# Patient Record
Sex: Female | Born: 1985 | Race: White | Hispanic: No | Smoking: Former smoker
Health system: Southern US, Community
[De-identification: ages and names within clinical notes are randomized; demographics above are authoritative.]

## PROBLEM LIST (undated history)

## (undated) DIAGNOSIS — F419 Anxiety disorder, unspecified: Secondary | ICD-10-CM

## (undated) DIAGNOSIS — A419 Sepsis, unspecified organism: Secondary | ICD-10-CM

## (undated) DIAGNOSIS — N289 Disorder of kidney and ureter, unspecified: Secondary | ICD-10-CM

## (undated) DIAGNOSIS — J189 Pneumonia, unspecified organism: Secondary | ICD-10-CM

## (undated) DIAGNOSIS — F4310 Post-traumatic stress disorder, unspecified: Secondary | ICD-10-CM

## (undated) DIAGNOSIS — Z87442 Personal history of urinary calculi: Secondary | ICD-10-CM

## (undated) DIAGNOSIS — K551 Chronic vascular disorders of intestine: Secondary | ICD-10-CM

## (undated) DIAGNOSIS — M358 Other specified systemic involvement of connective tissue: Secondary | ICD-10-CM

## (undated) DIAGNOSIS — I7300 Raynaud's syndrome without gangrene: Secondary | ICD-10-CM

## (undated) DIAGNOSIS — F410 Panic disorder [episodic paroxysmal anxiety] without agoraphobia: Secondary | ICD-10-CM

## (undated) DIAGNOSIS — M1990 Unspecified osteoarthritis, unspecified site: Secondary | ICD-10-CM

## (undated) DIAGNOSIS — N200 Calculus of kidney: Secondary | ICD-10-CM

## (undated) DIAGNOSIS — O1490 Unspecified pre-eclampsia, unspecified trimester: Secondary | ICD-10-CM

## (undated) DIAGNOSIS — D649 Anemia, unspecified: Secondary | ICD-10-CM

## (undated) DIAGNOSIS — Z9889 Other specified postprocedural states: Secondary | ICD-10-CM

## (undated) DIAGNOSIS — G43909 Migraine, unspecified, not intractable, without status migrainosus: Secondary | ICD-10-CM

## (undated) DIAGNOSIS — T8859XA Other complications of anesthesia, initial encounter: Secondary | ICD-10-CM

## (undated) DIAGNOSIS — M419 Scoliosis, unspecified: Secondary | ICD-10-CM

## (undated) DIAGNOSIS — L940 Localized scleroderma [morphea]: Secondary | ICD-10-CM

## (undated) DIAGNOSIS — T7840XA Allergy, unspecified, initial encounter: Secondary | ICD-10-CM

## (undated) DIAGNOSIS — R569 Unspecified convulsions: Secondary | ICD-10-CM

## (undated) DIAGNOSIS — F909 Attention-deficit hyperactivity disorder, unspecified type: Secondary | ICD-10-CM

## (undated) DIAGNOSIS — R112 Nausea with vomiting, unspecified: Secondary | ICD-10-CM

## (undated) DIAGNOSIS — F32A Depression, unspecified: Secondary | ICD-10-CM

## (undated) DIAGNOSIS — K743 Primary biliary cirrhosis: Secondary | ICD-10-CM

## (undated) HISTORY — DX: Sepsis, unspecified organism: A41.9

## (undated) HISTORY — DX: Calculus of kidney: N20.0

## (undated) HISTORY — DX: Anxiety disorder, unspecified: F41.9

## (undated) HISTORY — PX: OTHER SURGICAL HISTORY: SHX169

## (undated) HISTORY — DX: Allergy, unspecified, initial encounter: T78.40XA

## (undated) HISTORY — PX: WISDOM TOOTH EXTRACTION: SHX21

## (undated) HISTORY — DX: Depression, unspecified: F32.A

## (undated) HISTORY — PX: NO PAST SURGERIES: SHX2092

---
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
## 2008-03-07 ENCOUNTER — Emergency Department: Payer: Self-pay | Admitting: Emergency Medicine

## 2008-04-13 ENCOUNTER — Emergency Department: Payer: Self-pay | Admitting: Internal Medicine

## 2008-07-30 ENCOUNTER — Emergency Department: Payer: Self-pay | Admitting: Emergency Medicine

## 2009-04-28 ENCOUNTER — Emergency Department: Payer: Self-pay | Admitting: Emergency Medicine

## 2009-10-29 ENCOUNTER — Emergency Department: Payer: Self-pay | Admitting: Emergency Medicine

## 2011-08-31 ENCOUNTER — Emergency Department: Payer: Self-pay | Admitting: Emergency Medicine

## 2011-08-31 LAB — COMPREHENSIVE METABOLIC PANEL
Anion Gap: 9 (ref 7–16)
BUN: 8 mg/dL (ref 7–18)
Bilirubin,Total: 0.7 mg/dL (ref 0.2–1.0)
Calcium, Total: 9 mg/dL (ref 8.5–10.1)
Chloride: 102 mmol/L (ref 98–107)
Creatinine: 0.51 mg/dL — ABNORMAL LOW (ref 0.60–1.30)
EGFR (African American): >60
Potassium: 3.9 mmol/L (ref 3.5–5.1)
SGOT(AST): 21 U/L (ref 15–37)
Total Protein: 6.9 g/dL (ref 6.4–8.2)

## 2011-08-31 LAB — URINALYSIS, COMPLETE
Bilirubin,UR: NEGATIVE
Blood: NEGATIVE
Glucose,UR: NEGATIVE mg/dL (ref 0–75)
Leukocyte Esterase: NEGATIVE
Nitrite: NEGATIVE
Ph: 7 (ref 4.5–8.0)
RBC,UR: 1 /HPF (ref 0–5)
Squamous Epithelial: <1
WBC UR: 3 /HPF (ref 0–5)

## 2011-08-31 LAB — CBC
MCH: 33.9 pg (ref 26.0–34.0)
MCHC: 34.2 g/dL (ref 32.0–36.0)
MCV: 99 fL (ref 80–100)
Platelet: 174 10*3/uL (ref 150–440)
RBC: 3.8 10*6/uL (ref 3.80–5.20)
RDW: 12.9 % (ref 11.5–14.5)

## 2012-04-09 ENCOUNTER — Emergency Department: Payer: Self-pay | Admitting: Emergency Medicine

## 2012-04-09 LAB — COMPREHENSIVE METABOLIC PANEL
Albumin: 4.1 g/dL (ref 3.4–5.0)
Anion Gap: 9 (ref 7–16)
BUN: 11 mg/dL (ref 7–18)
Bilirubin,Total: 0.7 mg/dL (ref 0.2–1.0)
Calcium, Total: 9.1 mg/dL (ref 8.5–10.1)
EGFR (African American): >60
Glucose: 69 mg/dL (ref 65–99)
Osmolality: 272 (ref 275–301)
Potassium: 3.7 mmol/L (ref 3.5–5.1)
Sodium: 137 mmol/L (ref 136–145)
Total Protein: 7.2 g/dL (ref 6.4–8.2)

## 2012-04-09 LAB — CBC
HCT: 40.1 % (ref 35.0–47.0)
MCH: 34.1 pg — ABNORMAL HIGH (ref 26.0–34.0)
MCV: 100 fL (ref 80–100)
RBC: 4.01 10*6/uL (ref 3.80–5.20)
RDW: 12.1 % (ref 11.5–14.5)
WBC: 12.2 10*3/uL — ABNORMAL HIGH (ref 3.6–11.0)

## 2012-04-09 LAB — URINALYSIS, COMPLETE
Glucose,UR: NEGATIVE mg/dL (ref 0–75)
Leukocyte Esterase: NEGATIVE
Nitrite: NEGATIVE
Ph: 6 (ref 4.5–8.0)
Protein: NEGATIVE
RBC,UR: 5 /HPF (ref 0–5)
WBC UR: 1 /HPF (ref 0–5)

## 2012-04-09 LAB — HCG, QUANTITATIVE, PREGNANCY: Beta Hcg, Quant.: 48792 m[IU]/mL — ABNORMAL HIGH

## 2012-06-01 ENCOUNTER — Emergency Department: Payer: Self-pay | Admitting: Emergency Medicine

## 2012-06-12 ENCOUNTER — Encounter: Payer: Self-pay | Admitting: Maternal & Fetal Medicine

## 2012-10-27 ENCOUNTER — Observation Stay: Payer: Self-pay | Admitting: Obstetrics and Gynecology

## 2012-10-27 LAB — URINALYSIS, COMPLETE
Bilirubin,UR: NEGATIVE
Blood: NEGATIVE
Glucose,UR: NEGATIVE mg/dL (ref 0–75)
Leukocyte Esterase: NEGATIVE
Nitrite: NEGATIVE
Ph: 7 (ref 4.5–8.0)
Protein: NEGATIVE
Specific Gravity: 1.01 (ref 1.003–1.030)
WBC UR: 3 /HPF (ref 0–5)

## 2012-10-29 LAB — URINE CULTURE

## 2012-11-29 ENCOUNTER — Inpatient Hospital Stay: Payer: Self-pay | Admitting: Obstetrics and Gynecology

## 2012-11-29 LAB — CBC WITH DIFFERENTIAL/PLATELET
Basophil #: 0 10*3/uL (ref 0.0–0.1)
Basophil %: 0.2 %
Eosinophil #: 0.1 10*3/uL (ref 0.0–0.7)
HCT: 27.8 % — ABNORMAL LOW (ref 35.0–47.0)
Lymphocyte #: 2.1 10*3/uL (ref 1.0–3.6)
MCH: 31.9 pg (ref 26.0–34.0)
Monocyte #: 0.9 x10 3/mm (ref 0.2–0.9)
Monocyte %: 7.8 %
Neutrophil %: 73.9 %
Platelet: 232 10*3/uL (ref 150–440)

## 2012-12-01 LAB — HEMATOCRIT: HCT: 26.6 % — ABNORMAL LOW (ref 35.0–47.0)

## 2013-04-16 ENCOUNTER — Emergency Department: Payer: Self-pay | Admitting: Emergency Medicine

## 2013-04-16 LAB — COMPREHENSIVE METABOLIC PANEL
Albumin: 3.8 g/dL (ref 3.4–5.0)
Anion Gap: 6 — ABNORMAL LOW (ref 7–16)
Chloride: 108 mmol/L — ABNORMAL HIGH (ref 98–107)
Co2: 25 mmol/L (ref 21–32)
Creatinine: 0.7 mg/dL (ref 0.60–1.30)
EGFR (Non-African Amer.): >60
Glucose: 86 mg/dL (ref 65–99)
Osmolality: 277 (ref 275–301)
Potassium: 3.7 mmol/L (ref 3.5–5.1)
SGOT(AST): 16 U/L (ref 15–37)
SGPT (ALT): 21 U/L (ref 12–78)
Sodium: 139 mmol/L (ref 136–145)

## 2013-04-16 LAB — CBC
MCH: 33.5 pg (ref 26.0–34.0)
MCV: 95 fL (ref 80–100)
Platelet: 237 10*3/uL (ref 150–440)
RBC: 3.98 10*6/uL (ref 3.80–5.20)
RDW: 13 % (ref 11.5–14.5)
WBC: 5 10*3/uL (ref 3.6–11.0)

## 2013-04-16 LAB — URINALYSIS, COMPLETE
Bacteria: NONE SEEN
Glucose,UR: NEGATIVE mg/dL (ref 0–75)
Ketone: NEGATIVE
Nitrite: NEGATIVE
Ph: 6 (ref 4.5–8.0)
Protein: NEGATIVE
RBC,UR: <1 /HPF (ref 0–5)
Specific Gravity: 1.014 (ref 1.003–1.030)
Squamous Epithelial: <1

## 2013-12-01 ENCOUNTER — Emergency Department: Payer: Self-pay | Admitting: Emergency Medicine

## 2014-08-22 ENCOUNTER — Emergency Department: Payer: Self-pay | Admitting: Emergency Medicine

## 2014-08-25 ENCOUNTER — Emergency Department: Payer: Self-pay | Admitting: Emergency Medicine

## 2014-10-22 NOTE — H&P (Signed)
L&D Evaluation:  History Expanded:  HPI 29 yo G1 at 36 weeks of pregnancy who had a yeast infection diagnosed last week and she did not get the rx filled, she is having contractions now. VNI, tdap   Gravida 1   Term 0   PreTerm 0   Abortion 0   Living 0   Blood Type (Maternal) O positive   Group B Strep Results Maternal (Result >5wks must be treated as unknown) unknown/result > 5 weeks ago   Maternal HIV Negative   Maternal Syphilis Ab Nonreactive   Maternal Varicella Non-Immune   Rubella Results (Maternal) immune   Maternal [facility]-Dap Unknown   [facility]   Presents with contractions   Patient's Medical History anxiety   Patient's Surgical History none   Medications Pre Natal Vitamins   Allergies NKDA   Social History none   Family History Non-Contributory   Current Prenatal Course Notable For anxiety and first preg   ROS:  ROS All systems were reviewed.  HEENT, CNS, GI, GU, Respiratory, CV, Renal and Musculoskeletal systems were found to be normal.   Exam:  Vital Signs stable   Urine Protein 3plus prot   General no apparent distress   Mental Status clear   Chest clear   Heart normal sinus rhythm   Abdomen gravid, tender with contractions   Estimated Fetal Weight Average for gestational age   Fetal Position v   Back no CVAT   Edema no edema   Pelvic no external lesions   Mebranes Intact   FHT normal rate with no decels, cat 1 strictly reactive   Fetal Heart Rate 140   Ucx irregular   Skin dry   Impression:  Impression early labor, UTI   Plan:  Plan UA, monitor contractions and for cervical change   Comments terb for ctx to stop  cvx is closed and long head at zero station   Follow Up Appointment monday   Electronic Signatures: Adria DevonKlett, Aerionna Moravek (MD)  (Signed 16-May-14 20:42)  Authored: L&D Evaluation   Last Updated: 16-May-14 20:42 by Adria DevonKlett, Mohab Ashby (MD)

## 2015-04-20 ENCOUNTER — Emergency Department
Admission: EM | Admit: 2015-04-20 | Discharge: 2015-04-20 | Disposition: A | Discharge status: Home / Self Care | Payer: Self-pay | Attending: Emergency Medicine | Admitting: Emergency Medicine

## 2015-04-20 ENCOUNTER — Encounter: Payer: Self-pay | Admitting: Emergency Medicine

## 2015-04-20 DIAGNOSIS — Z720 Tobacco use: Secondary | ICD-10-CM | POA: Insufficient documentation

## 2015-04-20 DIAGNOSIS — R42 Dizziness and giddiness: Secondary | ICD-10-CM | POA: Insufficient documentation

## 2015-04-20 DIAGNOSIS — R55 Syncope and collapse: Secondary | ICD-10-CM | POA: Insufficient documentation

## 2015-04-20 DIAGNOSIS — Z3202 Encounter for pregnancy test, result negative: Secondary | ICD-10-CM | POA: Insufficient documentation

## 2015-04-20 HISTORY — DX: Migraine, unspecified, not intractable, without status migrainosus: G43.909

## 2015-04-20 LAB — TROPONIN I

## 2015-04-20 LAB — BASIC METABOLIC PANEL
Anion gap: 7 (ref 5–15)
BUN: 12 mg/dL (ref 6–20)
CHLORIDE: 107 mmol/L (ref 101–111)
CO2: 25 mmol/L (ref 22–32)
CREATININE: 0.61 mg/dL (ref 0.44–1.00)
Calcium: 9.9 mg/dL (ref 8.9–10.3)
GFR calc Af Amer: >60 mL/min (ref >60)
GFR calc non Af Amer: >60 mL/min (ref >60)
Glucose, Bld: 112 mg/dL — ABNORMAL HIGH (ref 65–99)
POTASSIUM: 3.9 mmol/L (ref 3.5–5.1)
SODIUM: 139 mmol/L (ref 135–145)

## 2015-04-20 LAB — URINALYSIS COMPLETE WITH MICROSCOPIC (ARMC ONLY)
BACTERIA UA: NONE SEEN
Bilirubin Urine: NEGATIVE
Glucose, UA: NEGATIVE mg/dL
Ketones, ur: NEGATIVE mg/dL
LEUKOCYTES UA: NEGATIVE
Nitrite: NEGATIVE
PH: 6 (ref 5.0–8.0)
Protein, ur: NEGATIVE mg/dL
Specific Gravity, Urine: 1.016 (ref 1.005–1.030)

## 2015-04-20 LAB — CBC
HEMATOCRIT: 40.1 % (ref 35.0–47.0)
Hemoglobin: 13.5 g/dL (ref 12.0–16.0)
MCH: 32.4 pg (ref 26.0–34.0)
MCHC: 33.6 g/dL (ref 32.0–36.0)
MCV: 96.4 fL (ref 80.0–100.0)
Platelets: 223 10*3/uL (ref 150–440)
RBC: 4.16 MIL/uL (ref 3.80–5.20)
RDW: 12.6 % (ref 11.5–14.5)
WBC: 8.2 10*3/uL (ref 3.6–11.0)

## 2015-04-20 LAB — POCT PREGNANCY, URINE: PREG TEST UR: NEGATIVE

## 2015-04-20 NOTE — Discharge Instructions (Signed)
As we discussed please call the number provided for cardiology to arrange a Holter monitor test. Return to the emergency department for any further chest pain, or syncopal episodes. Please stay very well-hydrated, drinking plenty of water throughout the day. Please limit caffeine use after 2 PM. As we have discussed please use over-the-counter Maalox one hour prior to going to bed, for symptomatic reflux relief.   Dizziness Dizziness is a common problem. It is a feeling of unsteadiness or light-headedness. You may feel like you are about to faint. Dizziness can lead to injury if you stumble or fall. Anyone can become dizzy, but dizziness is more common in older adults. This condition can be caused by a number of things, including medicines, dehydration, or illness. HOME CARE INSTRUCTIONS Taking these steps may help with your condition: Eating and Drinking  Drink enough fluid to keep your urine clear or pale yellow. This helps to keep you from becoming dehydrated. Try to drink more clear fluids, such as water.  Do not drink alcohol.  Limit your caffeine intake if directed by your health care provider.  Limit your salt intake if directed by your health care provider. Activity  Avoid making quick movements.  Rise slowly from chairs and steady yourself until you feel okay.  In the morning, first sit up on the side of the bed. When you feel okay, stand slowly while you hold onto something until you know that your balance is fine.  Move your legs often if you need to stand in one place for a long time. Tighten and relax your muscles in your legs while you are standing.  Do not drive or operate heavy machinery if you feel dizzy.  Avoid bending down if you feel dizzy. Place items in your home so that they are easy for you to reach without leaning over. Lifestyle  Do not use any tobacco products, including cigarettes, chewing tobacco, or electronic cigarettes. If you need help quitting, ask  your health care provider.  Try to reduce your stress level, such as with yoga or meditation. Talk with your health care provider if you need help. General Instructions  Watch your dizziness for any changes.  Take medicines only as directed by your health care provider. Talk with your health care provider if you think that your dizziness is caused by a medicine that you are taking.  Tell a friend or a family member that you are feeling dizzy. If he or she notices any changes in your behavior, have this person call your health care provider.  Keep all follow-up visits as directed by your health care provider. This is important. SEEK MEDICAL CARE IF:  Your dizziness does not go away.  Your dizziness or light-headedness gets worse.  You feel nauseous.  You have reduced hearing.  You have new symptoms.  You are unsteady on your feet or you feel like the room is spinning. SEEK IMMEDIATE MEDICAL CARE IF:  You vomit or have diarrhea and are unable to eat or drink anything.  You have problems talking, walking, swallowing, or using your arms, hands, or legs.  You feel generally weak.  You are not thinking clearly or you have trouble forming sentences. It may take a friend or family member to notice this.  You have chest pain, abdominal pain, shortness of breath, or sweating.  Your vision changes.  You notice any bleeding.  You have a headache.  You have neck pain or a stiff neck.  You have a fever.  This information is not intended to replace advice given to you by your health care provider. Make sure you discuss any questions you have with your health care provider.   Document Released: 11/24/2000 Document Revised: 10/15/2014 Document Reviewed: 05/27/2014 Elsevier Interactive Patient Education 2016 [employer]  Syncope Syncope is a medical term for fainting or passing out. This means you lose consciousness and drop to the ground. People are generally unconscious for  less than 5 minutes. You may have some muscle twitches for up to 15 seconds before waking up and returning to normal. Syncope occurs more often in older adults, but it can happen to anyone. While most causes of syncope are not dangerous, syncope can be a sign of a serious medical problem. It is important to seek medical care.  CAUSES  Syncope is caused by a sudden drop in blood flow to the brain. The specific cause is often not determined. Factors that can bring on syncope include:  Taking medicines that lower blood pressure.  Sudden changes in posture, such as standing up quickly.  Taking more medicine than prescribed.  Standing in one place for too long.  Seizure disorders.  Dehydration and excessive exposure to heat.  Low blood sugar (hypoglycemia).  Straining to have a bowel movement.  Heart disease, irregular heartbeat, or other circulatory problems.  Fear, emotional distress, seeing blood, or severe pain. SYMPTOMS  Right before fainting, you may:  Feel dizzy or light-headed.  Feel nauseous.  See all white or all black in your field of vision.  Have cold, clammy skin. DIAGNOSIS  Your health care provider will ask about your symptoms, perform a physical exam, and perform an electrocardiogram (ECG) to record the electrical activity of your heart. Your health care provider may also perform other heart or blood tests to determine the cause of your syncope which may include:  Transthoracic echocardiogram (TTE). During echocardiography, sound waves are used to evaluate how blood flows through your heart.  Transesophageal echocardiogram (TEE).  Cardiac monitoring. This allows your health care provider to monitor your heart rate and rhythm in real time.  Holter monitor. This is a portable device that records your heartbeat and can help diagnose heart arrhythmias. It allows your health care provider to track your heart activity for several days, if needed.  Stress tests by  exercise or by giving medicine that makes the heart beat faster. TREATMENT  In most cases, no treatment is needed. Depending on the cause of your syncope, your health care provider may recommend changing or stopping some of your medicines. HOME CARE INSTRUCTIONS  Have someone stay with you until you feel stable.  Do not drive, use machinery, or play sports until your health care provider says it is okay.  Keep all follow-up appointments as directed by your health care provider.  Lie down right away if you start feeling like you might faint. Breathe deeply and steadily. Wait until all the symptoms have passed.  Drink enough fluids to keep your urine clear or pale yellow.  If you are taking blood pressure or heart medicine, get up slowly and take several minutes to sit and then stand. This can reduce dizziness. SEEK IMMEDIATE MEDICAL CARE IF:   You have a severe headache.  You have unusual pain in the chest, abdomen, or back.  You are bleeding from your mouth or rectum, or you have black or tarry stool.  You have an irregular or very fast heartbeat.  You have pain with breathing.  You have  repeated fainting or seizure-like jerking during an episode.  You faint when sitting or lying down.  You have confusion.  You have trouble walking.  You have severe weakness.  You have vision problems. If you fainted, call your local emergency services (911 in [place] Do not drive yourself to the hospital.    This information is not intended to replace advice given to you by your health care provider. Make sure you discuss any questions you have with your health care provider.   Document Released: 05/31/2005 Document Revised: 10/15/2014 Document Reviewed: 07/30/2011 Elsevier Interactive Patient Education [employer]

## 2015-04-20 NOTE — ED Notes (Signed)
Patient states has a 6 month history of dizziness when getting up from laying or sitting position.  For past 3 days patient has had several episodes of syncope, with heart racing and shaking.  Last episode this morning.  C/o intermittent chest pain and shortness of breath.

## 2015-04-20 NOTE — ED Notes (Signed)
States she has had several episodes of 'passing"out over the past 6 months.  Dizziness when she stands up and has occasional palpitations.also has had some nausea and "metal "taste in her mouth

## 2015-04-20 NOTE — ED Provider Notes (Addendum)
[facility] Emergency Department Provider Note  Time seen: 3:18 PM  I have reviewed the triage vital signs and the nursing notes.   HISTORY  Chief Complaint Numbness    HPI Neldon McJamie L Lahue is a 29 y.o. female with a past medical history of migraines who presents the emergency department with a 6 month history of dizziness upon standing, and for the past 3 days she has had several near syncope and 1 syncope episode. Patient states for the past 6 months she will intermittently become dizzy worse upon standing. States for the past several days the dizziness has been worse, she feels like she is going to pass out and then today the patient did pass out. Denies any abdominal pain.  Denies any nausea, vomiting, diarrhea. Denies a possibility of pregnancy.Patient does state she has intermittently had chest pain for the past several months, denies any currently. States it is mild when it does occur. She also states a metallic taste in her mouth which is worse at night when she lies flat.    Past Medical History  Diagnosis Date  . Migraines     There are no active problems to display for this patient.   History reviewed. No pertinent past surgical history.  No current outpatient prescriptions on file.  Allergies Sulfa antibiotics  No family history on file.  Social History Social History  Substance Use Topics  . Smoking status: Current Every Day Smoker -- 1.00 packs/day    Types: Cigarettes  . Smokeless tobacco: Never Used  . Alcohol Use: No    Review of Systems Constitutional: Negative for fever Cardiovascular: Intermittent chest pain times several months. None currently. Respiratory: Negative for shortness of breath. Gastrointestinal: Negative for abdominal pain, vomiting and diarrhea. Genitourinary: Negative for dysuria. Neurological: Denies focal weakness or numbness. Patient does state nearly daily headaches but denies any currently. 10-point ROS  otherwise negative.  ____________________________________________   PHYSICAL EXAM:  VITAL SIGNS: ED Triage Vitals  Enc Vitals Group     BP 04/20/15 1337 118/83 mmHg     Pulse Rate 04/20/15 1337 102     Resp 04/20/15 1337 22     Temp 04/20/15 1337 98.2 F (36.8 C)     Temp Source 04/20/15 1337 Oral     SpO2 04/20/15 1337 100 %     Weight 04/20/15 1337 100 lb (45.36 kg)     Height 04/20/15 1337 5\' 4"  (1.626 m)     Head Cir --      Peak Flow --      Pain Score 04/20/15 1339 0     Pain Loc --      Pain Edu? --      Excl. in GC? --    Constitutional: Alert and oriented. Well appearing and in no distress. Eyes: Normal exam ENT   Head: Normocephalic and atraumatic.   Mouth/Throat: Mucous membranes are moist. Cardiovascular: Normal rate, regular rhythm. No murmur Respiratory: Normal respiratory effort without tachypnea nor retractions. Breath sounds are clear and equal bilaterally. No wheezes/rales/rhonchi. Gastrointestinal: Soft and nontender. No distention.   Musculoskeletal: Nontender with normal range of motion in all extremities. No lower extremity tenderness or edema. Neurologic:  Normal speech and language. No gross focal neurologic deficits  Skin:  Skin is warm, dry and intact.  Psychiatric: Mood and affect are normal. Speech and behavior are normal.  ____________________________________________    EKG  EKG reviewed and interpreted by myself shows normal sinus rhythm at 85 bpm, narrow  QRS, normal axis, normal intervals, no CVA changes present. Overall normal EKG.  ____________________________________________     INITIAL IMPRESSION / ASSESSMENT AND PLAN / ED COURSE  Pertinent labs & imaging results that were available during my care of the patient were reviewed by me and considered in my medical decision making (see chart for details).  Labs are largely within normal limits. Patient has a very normal physical exam. I discussed at length with the patient the  need to follow up with a cardiologist for a Holter monitor. Patient is agreeable and plans to do so. I also discussed given the metallic taste worse at night when she lies flat the possibility of gastric reflux. The patient states she will start an over-the-counter acid blocking medication as well as Maalox at night. Discussed with the patient the need to obtain a primary care doctor for long-term follow-up, she is agreeable as well but states she does not have insurance currently. I discussed for strict return precautions for any worsening pain, trouble breathing, or further syncopal events.  Troponin negative. Patient to follow-up with cardiology for Holter monitor.  ____________________________________________   FINAL CLINICAL IMPRESSION(S) / ED DIAGNOSES  Syncope Dizziness   Minna Antis, MD 04/20/15 1610  Minna Antis, MD 04/20/15 1601

## 2015-04-20 NOTE — ED Notes (Signed)
Discussed discharge instructions and follow-up care with patient. No questions or concerns at this time. Pt stable at discharge.  

## 2015-05-10 ENCOUNTER — Emergency Department
Admission: EM | Admit: 2015-05-10 | Discharge: 2015-05-10 | Disposition: A | Discharge status: Home / Self Care | Payer: Self-pay | Attending: Emergency Medicine | Admitting: Emergency Medicine

## 2015-05-10 ENCOUNTER — Encounter: Payer: Self-pay | Admitting: Medical Oncology

## 2015-05-10 DIAGNOSIS — O99330 Smoking (tobacco) complicating pregnancy, unspecified trimester: Secondary | ICD-10-CM | POA: Insufficient documentation

## 2015-05-10 DIAGNOSIS — Z3A00 Weeks of gestation of pregnancy not specified: Secondary | ICD-10-CM | POA: Insufficient documentation

## 2015-05-10 DIAGNOSIS — O039 Complete or unspecified spontaneous abortion without complication: Secondary | ICD-10-CM | POA: Insufficient documentation

## 2015-05-10 DIAGNOSIS — N939 Abnormal uterine and vaginal bleeding, unspecified: Secondary | ICD-10-CM

## 2015-05-10 DIAGNOSIS — F17210 Nicotine dependence, cigarettes, uncomplicated: Secondary | ICD-10-CM | POA: Insufficient documentation

## 2015-05-10 LAB — COMPREHENSIVE METABOLIC PANEL
ALT: 9 U/L — AB (ref 14–54)
AST: 14 U/L — ABNORMAL LOW (ref 15–41)
Albumin: 4.3 g/dL (ref 3.5–5.0)
Alkaline Phosphatase: 42 U/L (ref 38–126)
Anion gap: 8 (ref 5–15)
BUN: 7 mg/dL (ref 6–20)
CHLORIDE: 107 mmol/L (ref 101–111)
CO2: 25 mmol/L (ref 22–32)
CREATININE: 0.5 mg/dL (ref 0.44–1.00)
Calcium: 9.1 mg/dL (ref 8.9–10.3)
GFR calc Af Amer: >60 mL/min (ref >60)
GFR calc non Af Amer: >60 mL/min (ref >60)
Glucose, Bld: 89 mg/dL (ref 65–99)
Potassium: 3.6 mmol/L (ref 3.5–5.1)
SODIUM: 140 mmol/L (ref 135–145)
Total Bilirubin: 0.6 mg/dL (ref 0.3–1.2)
Total Protein: 6.9 g/dL (ref 6.5–8.1)

## 2015-05-10 LAB — CBC
HCT: 37.7 % (ref 35.0–47.0)
Hemoglobin: 12.5 g/dL (ref 12.0–16.0)
MCH: 31.9 pg (ref 26.0–34.0)
MCHC: 33.3 g/dL (ref 32.0–36.0)
MCV: 96 fL (ref 80.0–100.0)
PLATELETS: 259 10*3/uL (ref 150–440)
RBC: 3.93 MIL/uL (ref 3.80–5.20)
RDW: 12.9 % (ref 11.5–14.5)
WBC: 6.6 10*3/uL (ref 3.6–11.0)

## 2015-05-10 LAB — WET PREP, GENITAL
CLUE CELLS WET PREP: NONE SEEN
SPERM: NONE SEEN
Trich, Wet Prep: NONE SEEN
WBC, Wet Prep HPF POC: NONE SEEN
Yeast Wet Prep HPF POC: NONE SEEN

## 2015-05-10 LAB — CHLAMYDIA/NGC RT PCR (ARMC ONLY)
CHLAMYDIA TR: NOT DETECTED
N gonorrhoeae: NOT DETECTED

## 2015-05-10 LAB — HCG, QUANTITATIVE, PREGNANCY: hCG, Beta Chain, Quant, S: 1 m[IU]/mL (ref <5)

## 2015-05-10 NOTE — ED Provider Notes (Signed)
[facility] Emergency Department Provider Note  Time seen: 3:53 PM  I have reviewed the triage vital signs and the nursing notes.   HISTORY  Chief Complaint Abdominal Pain    HPI Megan Bentley is a 29 y.o. female with a past medical history of migraines who presents the emergency department with vaginal bleeding and tissue passage. According to the patient she is due for her next progesterone injection. However over the past one week she has been having vaginal spotting, which is increased over the past 2 days consistent with a period, however today she passed what appears to be tissue. Patient denies any abdominal pain. States she was having lower abdominal cramping which is largely resolved. Continues with moderate vaginal bleeding which she states is similar to a period.Denies abdominal pain or fever. Does not know when her last period was as she is on birth control, with irregular spotting.     Past Medical History  Diagnosis Date  . Migraines     There are no active problems to display for this patient.   History reviewed. No pertinent past surgical history.  No current outpatient prescriptions on file.  Allergies Sulfa antibiotics  No family history on file.  Social History Social History  Substance Use Topics  . Smoking status: Current Every Day Smoker -- 1.00 packs/day    Types: Cigarettes  . Smokeless tobacco: Never Used  . Alcohol Use: No    Review of Systems Constitutional: Negative for fever. Cardiovascular: Negative for chest pain. Respiratory: Negative for shortness of breath. Gastrointestinal: Lower abdominal cramping which is now resolved. Genitourinary: Negative for dysuria. Positive for vaginal bleeding with tissue passage Musculoskeletal: Negative for back pain. Neurological: Negative for headache 10-point ROS otherwise negative.  ____________________________________________   PHYSICAL EXAM:  VITAL SIGNS: ED  Triage Vitals  Enc Vitals Group     BP 05/10/15 1425 119/93 mmHg     Pulse Rate 05/10/15 1425 94     Resp 05/10/15 1425 18     Temp 05/10/15 1425 98.2 F (36.8 C)     Temp Source 05/10/15 1425 Oral     SpO2 05/10/15 1425 99 %     Weight 05/10/15 1425 100 lb (45.36 kg)     Height 05/10/15 1425 5\' 4"  (1.626 m)     Head Cir --      Peak Flow --      Pain Score 05/10/15 1426 2     Pain Loc --      Pain Edu? --      Excl. in GC? --     Constitutional: Alert and oriented. Well appearing and in no distress. Eyes: Normal exam ENT   Head: Normocephalic and atraumatic.   Mouth/Throat: Mucous membranes are moist. Cardiovascular: Normal rate, regular rhythm. No murmur Respiratory: Normal respiratory effort without tachypnea nor retractions. Breath sounds are clear  Gastrointestinal: Soft and nontender. No distention.   Musculoskeletal: Nontender with normal range of motion in all extremities.  Neurologic:  Normal speech and language. No gross focal neurologic deficits Skin:  Skin is warm, dry and intact.  Psychiatric: Mood and affect are normal. Speech and behavior are normal.  ____________________________________________    INITIAL IMPRESSION / ASSESSMENT AND PLAN / ED COURSE  Pertinent labs & imaging results that were available during my care of the patient were reviewed by me and considered in my medical decision making (see chart for details).  Patient presents for vaginal bleeding with tissue passage. She is concerned  that she may have had a miscarriage. States she was having lower abdominal cramping, which is now resolved. Labs are within normal limits including a negative beta-HCG. Patient with possible miscarriage versus clot. We will perform a pelvic exam, and likely have the patient follow up with OB/GYN. Nontender abdominal exam.  Pregnancy test negative. Pelvic exam shows a closed cervical os, no tissue present. No bleeding currently. No blood in the vaginal vault.  Patient states her bleeding is really diminished since passing tissue/clot. Patient with possible miscarriage versus vaginal bleeding. We will discharge home and the patient is follow up with her OB/GYN.  ____________________________________________   FINAL CLINICAL IMPRESSION(S) / ED DIAGNOSES  Vaginal bleeding Possible miscarriage   Minna Antis, MD 05/10/15 1655

## 2015-05-10 NOTE — ED Notes (Signed)
MD at bedside. 

## 2015-05-10 NOTE — Discharge Instructions (Signed)
As we discussed her workup today show normal results. This could possibly be consistent with a completed miscarriage first abnormal vaginal bleeding. Please follow up here primary care doctor/OB/GYN as soon as possible for recheck/reevaluation. Return to the emergency department for any abdominal pain, continued heavy vaginal bleeding, or any other symptom personally concerning to your self.   Miscarriage A miscarriage is the sudden loss of an unborn baby (fetus) before the 20th week of pregnancy. Most miscarriages happen in the first 3 months of pregnancy. Sometimes, it happens before a woman even knows she is pregnant. A miscarriage is also called a "spontaneous miscarriage" or "early pregnancy loss." Having a miscarriage can be an emotional experience. Talk with your caregiver about any questions you may have about miscarrying, the grieving process, and your future pregnancy plans. CAUSES   Problems with the fetal chromosomes that make it impossible for the baby to develop normally. Problems with the baby's genes or chromosomes are most often the result of errors that occur, by chance, as the embryo divides and grows. The problems are not inherited from the parents.  Infection of the cervix or uterus.   Hormone problems.   Problems with the cervix, such as having an incompetent cervix. This is when the tissue in the cervix is not strong enough to hold the pregnancy.   Problems with the uterus, such as an abnormally shaped uterus, uterine fibroids, or congenital abnormalities.   Certain medical conditions.   Smoking, drinking alcohol, or taking illegal drugs.   Trauma.  Often, the cause of a miscarriage is unknown.  SYMPTOMS   Vaginal bleeding or spotting, with or without cramps or pain.  Pain or cramping in the abdomen or lower back.  Passing fluid, tissue, or blood clots from the vagina. DIAGNOSIS  Your caregiver will perform a physical exam. You may also have an ultrasound  to confirm the miscarriage. Blood or urine tests may also be ordered. TREATMENT   Sometimes, treatment is not necessary if you naturally pass all the fetal tissue that was in the uterus. If some of the fetus or placenta remains in the body (incomplete miscarriage), tissue left behind may become infected and must be removed. Usually, a dilation and curettage (D and C) procedure is performed. During a D and C procedure, the cervix is widened (dilated) and any remaining fetal or placental tissue is gently removed from the uterus.  Antibiotic medicines are prescribed if there is an infection. Other medicines may be given to reduce the size of the uterus (contract) if there is a lot of bleeding.  If you have Rh negative blood and your baby was Rh positive, you will need a Rh immunoglobulin shot. This shot will protect any future baby from having Rh blood problems in future pregnancies. HOME CARE INSTRUCTIONS   Your caregiver may order bed rest or may allow you to continue light activity. Resume activity as directed by your caregiver.  Have someone help with home and family responsibilities during this time.   Keep track of the number of sanitary pads you use each day and how soaked (saturated) they are. Write down this information.   Do not use tampons. Do not douche or have sexual intercourse until approved by your caregiver.   Only take over-the-counter or prescription medicines for pain or discomfort as directed by your caregiver.   Do not take aspirin. Aspirin can cause bleeding.   Keep all follow-up appointments with your caregiver.   If you or your partner have  problems with grieving, talk to your caregiver or seek counseling to help cope with the pregnancy loss. Allow enough time to grieve before trying to get pregnant again.  SEEK IMMEDIATE MEDICAL CARE IF:   You have severe cramps or pain in your back or abdomen.  You have a fever.  You pass large blood clots (walnut-sized  or larger) ortissue from your vagina. Save any tissue for your caregiver to inspect.   Your bleeding increases.   You have a thick, bad-smelling vaginal discharge.  You become lightheaded, weak, or you faint.   You have chills.  MAKE SURE YOU:  Understand these instructions.  Will watch your condition.  Will get help right away if you are not doing well or get worse.   This information is not intended to replace advice given to you by your health care provider. Make sure you discuss any questions you have with your health care provider.   Document Released: 11/24/2000 Document Revised: 09/25/2012 Document Reviewed: 07/20/2011 Elsevier Interactive Patient Education [employer]

## 2015-05-10 NOTE — ED Notes (Signed)
Pt ambulatory to triage with reports that she quit taking her depo shot recently and about 1 week ago began having vaginal bleeding, this am she noticed she passed a clot. Pt reports some lower abd "tickle". Pt continues to have bleeding and is concerned.

## 2015-05-30 ENCOUNTER — Emergency Department
Admission: EM | Admit: 2015-05-30 | Discharge: 2015-05-30 | Disposition: A | Discharge status: Home / Self Care | Payer: Self-pay | Attending: Emergency Medicine | Admitting: Emergency Medicine

## 2015-05-30 ENCOUNTER — Encounter: Payer: Self-pay | Admitting: *Deleted

## 2015-05-30 ENCOUNTER — Emergency Department: Payer: Self-pay

## 2015-05-30 DIAGNOSIS — R300 Dysuria: Secondary | ICD-10-CM | POA: Insufficient documentation

## 2015-05-30 DIAGNOSIS — M545 Low back pain, unspecified: Secondary | ICD-10-CM

## 2015-05-30 DIAGNOSIS — Z3202 Encounter for pregnancy test, result negative: Secondary | ICD-10-CM | POA: Insufficient documentation

## 2015-05-30 DIAGNOSIS — F17210 Nicotine dependence, cigarettes, uncomplicated: Secondary | ICD-10-CM | POA: Insufficient documentation

## 2015-05-30 DIAGNOSIS — R61 Generalized hyperhidrosis: Secondary | ICD-10-CM | POA: Insufficient documentation

## 2015-05-30 DIAGNOSIS — R109 Unspecified abdominal pain: Secondary | ICD-10-CM | POA: Insufficient documentation

## 2015-05-30 HISTORY — DX: Disorder of kidney and ureter, unspecified: N28.9

## 2015-05-30 LAB — CBC WITH DIFFERENTIAL/PLATELET
Basophils Absolute: 0 10*3/uL (ref 0–0.1)
Basophils Relative: 1 %
EOS ABS: 0.1 10*3/uL (ref 0–0.7)
Eosinophils Relative: 2 %
HEMATOCRIT: 39.5 % (ref 35.0–47.0)
HEMOGLOBIN: 13.1 g/dL (ref 12.0–16.0)
LYMPHS ABS: 1.7 10*3/uL (ref 1.0–3.6)
LYMPHS PCT: 30 %
MCH: 32.2 pg (ref 26.0–34.0)
MCHC: 33.3 g/dL (ref 32.0–36.0)
MCV: 96.7 fL (ref 80.0–100.0)
MONOS PCT: 6 %
Monocytes Absolute: 0.4 10*3/uL (ref 0.2–0.9)
NEUTROS PCT: 61 %
Neutro Abs: 3.6 10*3/uL (ref 1.4–6.5)
Platelets: 215 10*3/uL (ref 150–440)
RBC: 4.09 MIL/uL (ref 3.80–5.20)
RDW: 13.7 % (ref 11.5–14.5)
WBC: 5.8 10*3/uL (ref 3.6–11.0)

## 2015-05-30 LAB — BASIC METABOLIC PANEL
Anion gap: 6 (ref 5–15)
BUN: 13 mg/dL (ref 6–20)
CHLORIDE: 108 mmol/L (ref 101–111)
CO2: 26 mmol/L (ref 22–32)
CREATININE: 0.58 mg/dL (ref 0.44–1.00)
Calcium: 9.6 mg/dL (ref 8.9–10.3)
GFR calc Af Amer: >60 mL/min (ref >60)
GFR calc non Af Amer: >60 mL/min (ref >60)
GLUCOSE: 96 mg/dL (ref 65–99)
Potassium: 3.9 mmol/L (ref 3.5–5.1)
Sodium: 140 mmol/L (ref 135–145)

## 2015-05-30 LAB — URINALYSIS COMPLETE WITH MICROSCOPIC (ARMC ONLY)
BACTERIA UA: NONE SEEN
Bilirubin Urine: NEGATIVE
Glucose, UA: NEGATIVE mg/dL
KETONES UR: NEGATIVE mg/dL
Nitrite: NEGATIVE
PH: 7 (ref 5.0–8.0)
Protein, ur: NEGATIVE mg/dL
SPECIFIC GRAVITY, URINE: 1.018 (ref 1.005–1.030)

## 2015-05-30 LAB — POCT PREGNANCY, URINE: Preg Test, Ur: NEGATIVE

## 2015-05-30 MED ORDER — PHENAZOPYRIDINE HCL 200 MG PO TABS: 200.0000 mg | ORAL_TABLET | Freq: Three times a day (TID) | ORAL | Status: DC | PRN

## 2015-05-30 MED ORDER — NITROFURANTOIN MONOHYD MACRO 100 MG PO CAPS: 100.0000 mg | ORAL_CAPSULE | Freq: Two times a day (BID) | ORAL | Status: DC

## 2015-05-30 MED ORDER — KETOROLAC TROMETHAMINE 30 MG/ML IJ SOLN
30.0000 mg | Freq: Once | INTRAMUSCULAR | Status: AC
  Administered 2015-05-30: 30 mg via INTRAVENOUS
  Filled 2015-05-30: qty 1

## 2015-05-30 NOTE — ED Notes (Signed)
Low back abd pain, headaceh sweats, painful urine

## 2015-05-30 NOTE — ED Notes (Signed)
MD at bedside.  Dr. Mayford KnifeWilliams in room to discuss discharge/plan of care.  When MD came out of room stated, "we're going to pause the discharge for a minute."  Patient sitting comfortably in room at this time.  Will continue to monitor.

## 2015-05-30 NOTE — ED Provider Notes (Addendum)
[facility] Emergency Department Provider Note     Time seen: ----------------------------------------- 12:36 PM on 05/30/2015 -----------------------------------------    I have reviewed the triage vital signs and the nursing notes.   HISTORY  Chief Complaint Urinary Retention    HPI Megan Bentley is a 29 y.o. female who presents ER with low back pain, sweats and painful urine. Patient is concerned because she passed kidney stone about a month ago and is concerned she may have another one. In the past she only had Dr. Reino Kent does drink on a daily basis. She is recently increased her water intake, she denies any other complaints at this time.   Past Medical History  Diagnosis Date  . Migraines   . Renal disorder     There are no active problems to display for this patient.   History reviewed. No pertinent past surgical history.  Allergies Sulfa antibiotics  Social History Social History  Substance Use Topics  . Smoking status: Current Every Day Smoker -- 1.00 packs/day    Types: Cigarettes  . Smokeless tobacco: Never Used  . Alcohol Use: No    Review of Systems Constitutional: Negative for fever. Eyes: Negative for visual changes. ENT: Negative for sore throat. Cardiovascular: Negative for chest pain. Respiratory: Negative for shortness of breath. Gastrointestinal: Negative for abdominal pain, vomiting and diarrhea. Genitourinary: Positive for dysuria Musculoskeletal: Positive for back pain. Skin: Positive for sweats Neurological: Negative for headaches, focal weakness or numbness.  10-point ROS otherwise negative.  ____________________________________________   PHYSICAL EXAM:  VITAL SIGNS: ED Triage Vitals  Enc Vitals Group     BP 05/30/15 1226 126/71 mmHg     Pulse Rate 05/30/15 1226 106     Resp 05/30/15 1226 20     Temp 05/30/15 1226 99.3 F (37.4 C)     Temp Source 05/30/15 1226 Oral     SpO2 05/30/15 1226 100 %      Weight 05/30/15 1226 100 lb (45.36 kg)     Height 05/30/15 1226  (1.626 m)     Head Cir --      Peak Flow --      Pain Score 05/30/15 1227 5     Pain Loc --      Pain Edu? --      Excl. in GC? --     Constitutional: Alert and oriented. Well appearing and in no distress. Eyes: Conjunctivae are normal. PERRL. Normal extraocular movements. ENT   Head: Normocephalic and atraumatic.   Nose: No congestion/rhinnorhea.   Mouth/Throat: Mucous membranes are moist.   Neck: No stridor. Cardiovascular: Normal rate, regular rhythm. Normal and symmetric distal pulses are present in all extremities. No murmurs, rubs, or gallops. Respiratory: Normal respiratory effort without tachypnea nor retractions. Breath sounds are clear and equal bilaterally. No wheezes/rales/rhonchi. Gastrointestinal: Soft and nontender. No distention. No abdominal bruits.  Musculoskeletal: Nontender with normal range of motion in all extremities. No joint effusions.  No lower extremity tenderness nor edema. Neurologic:  Normal speech and language. No gross focal neurologic deficits are appreciated. Speech is normal. No gait instability. Skin:  Skin is warm, dry and intact. No rash noted. Psychiatric: Mood and affect are normal. Speech and behavior are normal. Patient exhibits appropriate insight and judgment.  ____________________________________________  ED COURSE:  Pertinent labs & imaging results that were available during my care of the patient were reviewed by me and considered in my medical decision making (see chart for details). Patient is in no  acute distress, will check basic labs and obtain renal ultrasound. ____________________________________________    LABS (pertinent positives/negatives)  Labs Reviewed  URINALYSIS COMPLETEWITH MICROSCOPIC ([facility] ONLY) - Abnormal; Notable for the following:    Color, Urine YELLOW (*)    APPearance HAZY (*)    Hgb urine dipstick 1+ (*)    Leukocytes,  UA TRACE (*)    Squamous Epithelial / LPF 6-30 (*)    All other components within normal limits  CBC WITH DIFFERENTIAL/PLATELET  BASIC METABOLIC PANEL  POC URINE PREG, ED  POCT PREGNANCY, URINE    RADIOLOGY Images were viewed by me  Renal ultrasound  IMPRESSION: Nonobstructing calculi in each kidney. No obstructing focus appreciable on either side. Study otherwise unremarkable. IMPRESSION: 1. Bilateral nephrolithiasis, without obstructive uropathy. 2. Possible constipation. ____________________________________________  FINAL ASSESSMENT AND PLAN  Flank pain, dysuria  Plan: Patient with labs and imaging as dictated above. No active kidney stone seen on ultrasound. We'll place on Pyridium and Macrobid and have her follow-up with her doctor for reevaluation.   Emily FilbertWilliams, Jonathan E, MD   Emily FilbertJonathan E Williams, MD 05/30/15 [id number]  Emily FilbertJonathan E Williams, MD 05/30/15 [phone]

## 2015-05-30 NOTE — Discharge Instructions (Signed)
Back Pain, Adult °Back pain is very common in adults. The cause of back pain is rarely dangerous and the pain often gets better over time. The cause of your back pain may not be known. Some common causes of back pain include: °· Strain of the muscles or ligaments supporting the spine. °· Wear and tear (degeneration) of the spinal disks. °· Arthritis. °· Direct injury to the back. °For many people, back pain may return. Since back pain is rarely dangerous, most people can learn to manage this condition on their own. °HOME CARE INSTRUCTIONS °Watch your back pain for any changes. The following actions may help to lessen any discomfort you are feeling: °· Remain active. It is stressful on your back to sit or stand in one place for long periods of time. Do not sit, drive, or stand in one place for more than 30 minutes at a time. Take short walks on even surfaces as soon as you are able. Try to increase the length of time you walk each day. °· Exercise regularly as directed by your health care provider. Exercise helps your back heal faster. It also helps avoid future injury by keeping your muscles strong and flexible. °· Do not stay in bed. Resting more than 1-2 days can delay your recovery. °· Pay attention to your body when you bend and lift. The most comfortable positions are those that put less stress on your recovering back. Always use proper lifting techniques, including: °· Bending your knees. °· Keeping the load close to your body. °· Avoiding twisting. °· Find a comfortable position to sleep. Use a firm mattress and lie on your side with your knees slightly bent. If you lie on your back, put a pillow under your knees. °· Avoid feeling anxious or stressed. Stress increases muscle tension and can worsen back pain. It is important to recognize when you are anxious or stressed and learn ways to manage it, such as with exercise. °· Take medicines only as directed by your health care provider. Over-the-counter  medicines to reduce pain and inflammation are often the most helpful. Your health care provider may prescribe muscle relaxant drugs. These medicines help dull your pain so you can more quickly return to your normal activities and healthy exercise. °· Apply ice to the injured area: °· Put ice in a plastic bag. °· Place a towel between your skin and the bag. °· Leave the ice on for 20 minutes, 2-3 times a day for the first 2-3 days. After that, ice and heat may be alternated to reduce pain and spasms. °· Maintain a healthy weight. Excess weight puts extra stress on your back and makes it difficult to maintain good posture. °SEEK MEDICAL CARE IF: °· You have pain that is not relieved with rest or medicine. °· You have increasing pain going down into the legs or buttocks. °· You have pain that does not improve in one week. °· You have night pain. °· You lose weight. °· You have a fever or chills. °SEEK IMMEDIATE MEDICAL CARE IF:  °· You develop new bowel or bladder control problems. °· You have unusual weakness or numbness in your arms or legs. °· You develop nausea or vomiting. °· You develop abdominal pain. °· You feel faint. °  °This information is not intended to replace advice given to you by your health care provider. Make sure you discuss any questions you have with your health care provider. °  °Document Released: 05/31/2005 Document Revised: 06/21/2014 Document Reviewed: 10/02/2013 °Elsevier Interactive Patient Education ©2016 Elsevier   Inc[employer]  Dysuria Dysuria is pain or discomfort while urinating. The pain or discomfort may be felt in the tube that carries urine out of the bladder (urethra) or in the surrounding tissue of the genitals. The pain may also be felt in the groin area, lower abdomen, and lower back. You may have to urinate frequently or have the sudden feeling that you have to urinate (urgency). Dysuria can affect both men and women, but is more common in women. Dysuria can be caused by many  different things, including:  Urinary tract infection in women.  Infection of the kidney or bladder.  Kidney stones or bladder stones.  Certain sexually transmitted infections (STIs), such as chlamydia.  Dehydration.  Inflammation of the vagina.  Use of certain medicines.  Use of certain soaps or scented products that cause irritation. HOME CARE INSTRUCTIONS Watch your dysuria for any changes. The following actions may help to reduce any discomfort you are feeling:  Drink enough fluid to keep your urine clear or pale yellow.  Empty your bladder often. Avoid holding urine for long periods of time.  After a bowel movement or urination, women should cleanse from front to back, using each tissue only once.  Empty your bladder after sexual intercourse.  Take medicines only as directed by your health care provider.  If you were prescribed an antibiotic medicine, finish it all even if you start to feel better.  Avoid caffeine, tea, and alcohol. They can irritate the bladder and make dysuria worse. In men, alcohol may irritate the prostate.  Keep all follow-up visits as directed by your health care provider. This is important.  If you had any tests done to find the cause of dysuria, it is your responsibility to obtain your test results. Ask the lab or department performing the test when and how you will get your results. Talk with your health care provider if you have any questions about your results. SEEK MEDICAL CARE IF:  You develop pain in your back or sides.  You have a fever.  You have nausea or vomiting.  You have blood in your urine.  You are not urinating as often as you usually do. SEEK IMMEDIATE MEDICAL CARE IF:  You pain is severe and not relieved with medicines.  You are unable to hold down any fluids.  You or someone else notices a change in your mental function.  You have a rapid heartbeat at rest.  You have shaking or chills.  You feel extremely  weak.   This information is not intended to replace advice given to you by your health care provider. Make sure you discuss any questions you have with your health care provider.   Document Released: 02/27/2004 Document Revised: 06/21/2014 Document Reviewed: 01/24/2014 Elsevier Interactive Patient Education [employer]2016 Elsevier Inc.

## 2015-06-17 DIAGNOSIS — K6289 Other specified diseases of anus and rectum: Secondary | ICD-10-CM | POA: Insufficient documentation

## 2015-06-17 DIAGNOSIS — K5900 Constipation, unspecified: Secondary | ICD-10-CM | POA: Insufficient documentation

## 2015-06-17 DIAGNOSIS — F17210 Nicotine dependence, cigarettes, uncomplicated: Secondary | ICD-10-CM | POA: Insufficient documentation

## 2015-06-17 NOTE — ED Notes (Signed)
Pt c/o rectal pain after enema and taking "stuff to make me go" today. Pt reports constipation x 10 days.

## 2015-06-18 ENCOUNTER — Emergency Department
Admission: EM | Admit: 2015-06-18 | Discharge: 2015-06-18 | Discharge status: Against Medical Advice | Payer: Self-pay | Attending: Emergency Medicine | Admitting: Emergency Medicine

## 2015-07-15 ENCOUNTER — Emergency Department: Payer: Self-pay

## 2015-07-15 ENCOUNTER — Emergency Department
Admission: EM | Admit: 2015-07-15 | Discharge: 2015-07-15 | Disposition: A | Discharge status: Home / Self Care | Payer: Self-pay | Attending: Emergency Medicine | Admitting: Emergency Medicine

## 2015-07-15 DIAGNOSIS — N23 Unspecified renal colic: Secondary | ICD-10-CM | POA: Insufficient documentation

## 2015-07-15 DIAGNOSIS — Z3202 Encounter for pregnancy test, result negative: Secondary | ICD-10-CM | POA: Insufficient documentation

## 2015-07-15 DIAGNOSIS — Z792 Long term (current) use of antibiotics: Secondary | ICD-10-CM | POA: Insufficient documentation

## 2015-07-15 DIAGNOSIS — F17210 Nicotine dependence, cigarettes, uncomplicated: Secondary | ICD-10-CM | POA: Insufficient documentation

## 2015-07-15 DIAGNOSIS — N200 Calculus of kidney: Secondary | ICD-10-CM | POA: Insufficient documentation

## 2015-07-15 LAB — URINALYSIS COMPLETE WITH MICROSCOPIC (ARMC ONLY)
BACTERIA UA: NONE SEEN
Bilirubin Urine: NEGATIVE
Glucose, UA: NEGATIVE mg/dL
Ketones, ur: NEGATIVE mg/dL
Leukocytes, UA: NEGATIVE
Nitrite: NEGATIVE
PROTEIN: NEGATIVE mg/dL
Specific Gravity, Urine: 1.016 (ref 1.005–1.030)
pH: 6 (ref 5.0–8.0)

## 2015-07-15 LAB — COMPREHENSIVE METABOLIC PANEL
ALK PHOS: 40 U/L (ref 38–126)
ALT: 37 U/L (ref 14–54)
ANION GAP: 6 (ref 5–15)
AST: 63 U/L — ABNORMAL HIGH (ref 15–41)
Albumin: 4.8 g/dL (ref 3.5–5.0)
BILIRUBIN TOTAL: 0.5 mg/dL (ref 0.3–1.2)
BUN: 12 mg/dL (ref 6–20)
CALCIUM: 9.8 mg/dL (ref 8.9–10.3)
CO2: 28 mmol/L (ref 22–32)
Chloride: 105 mmol/L (ref 101–111)
Creatinine, Ser: 0.61 mg/dL (ref 0.44–1.00)
GFR calc Af Amer: >60 mL/min (ref >60)
Glucose, Bld: 103 mg/dL — ABNORMAL HIGH (ref 65–99)
POTASSIUM: 3.7 mmol/L (ref 3.5–5.1)
Sodium: 139 mmol/L (ref 135–145)
Total Protein: 7.4 g/dL (ref 6.5–8.1)

## 2015-07-15 LAB — CBC
HEMATOCRIT: 40.7 % (ref 35.0–47.0)
HEMOGLOBIN: 13.8 g/dL (ref 12.0–16.0)
MCH: 33.4 pg (ref 26.0–34.0)
MCHC: 34 g/dL (ref 32.0–36.0)
MCV: 98.4 fL (ref 80.0–100.0)
Platelets: 241 10*3/uL (ref 150–440)
RBC: 4.14 MIL/uL (ref 3.80–5.20)
RDW: 13.5 % (ref 11.5–14.5)
WBC: 10.8 10*3/uL (ref 3.6–11.0)

## 2015-07-15 LAB — POCT PREGNANCY, URINE: PREG TEST UR: NEGATIVE

## 2015-07-15 MED ORDER — SODIUM CHLORIDE 0.9 % IV BOLUS (SEPSIS)
1000.0000 mL | Freq: Once | INTRAVENOUS | Status: AC
  Administered 2015-07-15: 1000 mL via INTRAVENOUS

## 2015-07-15 MED ORDER — CYCLOBENZAPRINE HCL 10 MG PO TABS: 10.0000 mg | ORAL_TABLET | Freq: Three times a day (TID) | ORAL | Status: DC | PRN

## 2015-07-15 MED ORDER — CIPROFLOXACIN HCL 500 MG PO TABS: 500.0000 mg | ORAL_TABLET | Freq: Two times a day (BID) | ORAL | Status: DC

## 2015-07-15 MED ORDER — KETOROLAC TROMETHAMINE 60 MG/2ML IM SOLN: 60.0000 mg | Freq: Once | INTRAMUSCULAR | Status: DC

## 2015-07-15 MED ORDER — KETOROLAC TROMETHAMINE 10 MG PO TABS: 10.0000 mg | ORAL_TABLET | Freq: Four times a day (QID) | ORAL | Status: DC | PRN

## 2015-07-15 MED ORDER — KETOROLAC TROMETHAMINE 30 MG/ML IJ SOLN
30.0000 mg | Freq: Once | INTRAMUSCULAR | Status: AC
Start: 2015-07-15 — End: 2015-07-15
  Administered 2015-07-15: 30 mg via INTRAVENOUS
  Filled 2015-07-15: qty 1

## 2015-07-15 NOTE — ED Provider Notes (Signed)
[facility] Emergency Department Provider Note  ____________________________________________  Time seen: Approximately 9:14 PM  I have reviewed the triage vital signs and the nursing notes.   HISTORY  Chief Complaint Flank Pain   HPI Megan Bentley is a 30 y.o. female, NAD, reports the emergency department tonight with complaints of left flank pain 1 day.Pain was severe this morning and was accompanied with hematuria. Has had some dysuria through the day. Some fatigue. Denies fevers, chills, body aches. No abdominal or pelvic pain. Denies any edema lower extremities. Was seen in this emergency department in December 2016 in which she had a CT scan noted numerous renal calculi. She has not followed up in an outpatient setting in regards to those results. She has actively passed 2 stones in the last 24 hours.   Past Medical History  Diagnosis Date  . Migraines   . Renal disorder     There are no active problems to display for this patient.   No past surgical history on file.  Current Outpatient Rx  Name  Route  Sig  Dispense  Refill  . ciprofloxacin (CIPRO) 500 MG tablet   Oral   Take 1 tablet (500 mg total) by mouth 2 (two) times daily.   20 tablet   0   . cyclobenzaprine (FLEXERIL) 10 MG tablet   Oral   Take 1 tablet (10 mg total) by mouth 3 (three) times daily as needed for muscle spasms.   21 tablet   0   . ketorolac (TORADOL) 10 MG tablet   Oral   Take 1 tablet (10 mg total) by mouth every 6 (six) hours as needed.   10 tablet   0   . nitrofurantoin, macrocrystal-monohydrate, (MACROBID) 100 MG capsule   Oral   Take 1 capsule (100 mg total) by mouth 2 (two) times daily.   20 capsule   0   . phenazopyridine (PYRIDIUM) 200 MG tablet   Oral   Take 1 tablet (200 mg total) by mouth 3 (three) times daily as needed for pain.   20 tablet   0     Allergies Sulfa antibiotics  No family history on file.  Social History Social History   Substance Use Topics  . Smoking status: Current Every Day Smoker -- 1.00 packs/day    Types: Cigarettes  . Smokeless tobacco: Never Used  . Alcohol Use: No     Review of Systems  Constitutional: No fever/chills. Positive for fatigue. Cardiovascular: No chest pain. Respiratory: No cough. No shortness of breath. No wheezing.  Gastrointestinal: Positive for nausea. No vomiting. No abdominal pain.    Genitourinary: Positive dysuria, hematuria. Increased urinary frequency No urinary hesitancy, urgency. Musculoskeletal: Left lower back pain. Skin: Negative for rash. Neurological: Negative for headaches, focal weakness or numbness. 10-point ROS otherwise negative.  ____________________________________________   PHYSICAL EXAM:  VITAL SIGNS: ED Triage Vitals  Enc Vitals Group     BP 07/15/15 1909 116/67 mmHg     Pulse Rate 07/15/15 1909 86     Resp 07/15/15 1909 17     Temp 07/15/15 1909 98.3 F (36.8 C)     Temp Source 07/15/15 1909 Oral     SpO2 07/15/15 1909 95 %     Weight 07/15/15 1909 100 lb (45.36 kg)     Height 07/15/15 1909  (1.6 m)     Head Cir --      Peak Flow --      Pain Score 07/15/15  1918 7     Pain Loc --      Pain Edu? --      Excl. in GC? --     Constitutional: Alert and oriented. Well appearing and in no acute distress. Eyes: Conjunctivae are normal. PERRL.  Head: Atraumatic. Neck: Supple with full range of motion Hematological/Lymphatic/Immunilogical: No cervical lymphadenopathy. Cardiovascular: Normal rate, regular rhythm. Normal S1 and S2.  Good peripheral circulation. Respiratory: Normal respiratory effort without tachypnea or retractions. Lungs CTAB. Gastrointestinal: Soft and nontender. No distention. Left CVA tenderness Musculoskeletal: No lower extremity tenderness nor edema.  No joint effusions. Neurologic:  Normal speech and language. No gross focal neurologic deficits are appreciated.  Skin:  Skin is warm, dry and intact. No rash  noted. Psychiatric: Mood and affect are normal. Speech and behavior are normal. Patient exhibits appropriate insight and judgement.   ____________________________________________   LABS (all labs ordered are listed, but only abnormal results are displayed)  Labs Reviewed  COMPREHENSIVE METABOLIC PANEL - Abnormal; Notable for the following:    Glucose, Bld 103 (*)    AST 63 (*)    All other components within normal limits  URINALYSIS COMPLETEWITH MICROSCOPIC ([facility] ONLY) - Abnormal; Notable for the following:    Color, Urine YELLOW (*)    APPearance CLOUDY (*)    Hgb urine dipstick 3+ (*)    Squamous Epithelial / LPF TOO NUMEROUS TO COUNT (*)    All other components within normal limits  CBC  POC URINE PREG, ED  POCT PREGNANCY, URINE   ____________________________________________  EKG  None ____________________________________________  RADIOLOGY I have personally viewed and evaluated these images (plain radiographs) as part of my medical decision making, as well as reviewing the written report by the radiologist.  Ct Renal Stone Study  07/15/2015  CLINICAL DATA:  Left flank pain since yesterday. History of kidney stones. Hematuria and dysuria. EXAM: CT ABDOMEN AND PELVIS WITHOUT CONTRAST TECHNIQUE: Multidetector CT imaging of the abdomen and pelvis was performed following the standard protocol without IV contrast. COMPARISON:  05/30/2015 FINDINGS: Lower chest:  Lung bases are clear. Hepatobiliary: Normal appearance of the liver. Gallbladder is small and decompressed. Pancreas: No gross abnormality to the pancreas but limited evaluation due to the lack of adjacent fat. Spleen: Normal appearance of the spleen without enlargement. Adrenals/Urinary Tract: Normal appearance of the adrenal glands. There are at least 5 right kidney stones without hydronephrosis. Largest right kidney stone measures roughly 5 mm. Normal appearance of the urinary bladder. Multiple left renal stones and the  largest is in the upper/interpolar region measuring close to 8 mm. There is no evidence for left hydronephrosis or left ureter stones. There is no significant perinephric stranding. Stomach/Bowel: No gross abnormality to the stomach, small bowel or colon. Vascular/Lymphatic: Limited evaluation for lymphadenopathy due to the lack intravenous contrast and lack of intra-abdominal fat. Reproductive: There is a 3.9 cm low-density structure in the region of the left adnexa and this could represent a left ovarian cyst. No gross abnormality to the uterus or right adnexa. Other: There is no significant free fluid. Musculoskeletal:  No acute bone abnormality. IMPRESSION: Bilateral nephrolithiasis without hydronephrosis. 3.9 cm low-density structure in the left adnexa region. This could represent an ovarian cyst. If this is an area of clinical concern, consider further characterization with ultrasound. Electronically Signed   By: Richarda Overlie M.D.   On: 07/15/2015 21:54    ____________________________________________    PROCEDURES  Procedure(s) performed: None    Medications  sodium chloride  0.9 % bolus 1,000 mL (1,000 mLs Intravenous New Bag/Given 07/15/15 2218)  ketorolac (TORADOL) 30 MG/ML injection 30 mg (30 mg Intravenous Given 07/15/15 2244)   Patient declined pain management utilizing narcotics.   ____________________________________________   INITIAL IMPRESSION / ASSESSMENT AND PLAN / ED COURSE  Pertinent labs & imaging results that were available during my care of the patient were reviewed by me and considered in my medical decision making (see chart for details).  Patient's diagnosis is consistent with nephrolithiasis with possible pyelonephritis. Patient will be discharged home with prescriptions for cyclobenzaprine to decrease muscle spasms and contractions, Toradol 10 mg tablets to decrease pain and inflammation, ciprofloxacin 500 mg to take twice daily for 10 days to cover for potential  bacterial infection. I have opted to cover empirically for urinary infection as patient's WBC at today's visit was double the value as noted on her visit 05/30/2015. She is currently asymptomatic for routine UTI, but will go ahead and culture urine.  Patient was made aware of all findings on her CT scan today. She denies any adnexal or pelvic pain and will follow with her OB/GYN to evaluate for possible ovarian cysts. Patient is to follow up with urology to discuss further evaluation of nephrolithiasis and discuss potential treatment plans. At this time no evidence of hydronephrosis or obstructing renal calculi as noted, therefore outpatient follow-up is recommended. Patient is given ED precautions to return to the ED for any worsening or new symptoms.    ____________________________________________  FINAL CLINICAL IMPRESSION(S) / ED DIAGNOSES  Final diagnoses:  Nephrolithiasis  Renal colic on left side      NEW MEDICATIONS STARTED DURING THIS VISIT:  New Prescriptions   CIPROFLOXACIN (CIPRO) 500 MG TABLET    Take 1 tablet (500 mg total) by mouth 2 (two) times daily.   CYCLOBENZAPRINE (FLEXERIL) 10 MG TABLET    Take 1 tablet (10 mg total) by mouth 3 (three) times daily as needed for muscle spasms.   KETOROLAC (TORADOL) 10 MG TABLET    Take 1 tablet (10 mg total) by mouth every 6 (six) hours as needed.          Hope Pigeon, PA-C 07/15/15 2318  Sharyn Creamer, MD 07/15/15 2328

## 2015-07-15 NOTE — ED Notes (Signed)
Pt in with co left flank pain since yest, hx of kidney stone.  Is having bloody urine and dysuria, denies fever.

## 2015-07-15 NOTE — Discharge Instructions (Signed)
Dietary Guidelines to Help Prevent Kidney Stones °Your risk of kidney stones can be decreased by adjusting the foods you eat. The most important thing you can do is drink enough fluid. You should drink enough fluid to keep your urine clear or pale yellow. The following guidelines provide specific information for the type of kidney stone you have had. °GUIDELINES ACCORDING TO TYPE OF KIDNEY STONE °Calcium Oxalate Kidney Stones °· Reduce the amount of salt you eat. Foods that have a lot of salt cause your body to release excess calcium into your urine. The excess calcium can combine with a substance called oxalate to form kidney stones. °· Reduce the amount of animal protein you eat if the amount you eat is excessive. Animal protein causes your body to release excess calcium into your urine. Ask your dietitian how much protein from animal sources you should be eating. °· Avoid foods that are high in oxalates. If you take vitamins, they should have less than 500 mg of vitamin C. Your body turns vitamin C into oxalates. You do not need to avoid fruits and vegetables high in vitamin C. °Calcium Phosphate Kidney Stones °· Reduce the amount of salt you eat to help prevent the release of excess calcium into your urine. °· Reduce the amount of animal protein you eat if the amount you eat is excessive. Animal protein causes your body to release excess calcium into your urine. Ask your dietitian how much protein from animal sources you should be eating. °· Get enough calcium from food or take a calcium supplement (ask your dietitian for recommendations). Food sources of calcium that do not increase your risk of kidney stones include: °· Broccoli. °· Dairy products, such as cheese and yogurt. °· Pudding. °Uric Acid Kidney Stones °· Do not have more than 6 oz of animal protein per day. °FOOD SOURCES °Animal Protein Sources °· Meat (all types). °· Poultry. °· Eggs. °· Fish, seafood. °Foods High in Salt °· Salt seasonings. °· Soy  sauce. °· Teriyaki sauce. °· Cured and processed meats. °· Salted crackers and snack foods. °· Fast food. °· Canned soups and most canned foods. °Foods High in Oxalates °· Grains: °· Amaranth. °· Barley. °· Grits. °· Wheat germ. °· Bran. °· Buckwheat flour. °· All bran cereals. °· Pretzels. °· Whole wheat bread. °· Vegetables: °· Beans (wax). °· Beets and beet greens. °· Collard greens. °· Eggplant. °· Escarole. °· Leeks. °· Okra. °· Parsley. °· Rutabagas. °· Spinach. °· Swiss chard. °· Tomato paste. °· Fried potatoes. °· Sweet potatoes. °· Fruits: °· Red currants. °· Figs. °· Kiwi. °· Rhubarb. °· Meat and Other Protein Sources: °· Beans (dried). °· Soy burgers and other soybean products. °· Miso. °· Nuts (peanuts, almonds, pecans, cashews, hazelnuts). °· Nut butters. °· Sesame seeds and tahini (paste made of sesame seeds). °· Poppy seeds. °· Beverages: °· Chocolate drink mixes. °· Soy milk. °· Instant iced tea. °· Juices made from high-oxalate fruits or vegetables. °· Other: °· Carob. °· Chocolate. °· Fruitcake. °· Marmalades. °  °This information is not intended to replace advice given to you by your health care provider. Make sure you discuss any questions you have with your health care provider. °  °Document Released: 09/25/2010 Document Revised: 06/05/2013 Document Reviewed: 04/27/2013 °Elsevier Interactive Patient Education ©2016 Else[employer] Inc[employer] ° °Kidney Stones °Kidney stones (urolithiasis) are deposits that form inside your kidneys. The intense pain is caused by the stone moving through the urinary tract. When the stone moves, the ureter   goes into spasm around the stone. The stone is usually passed in the urine.  °CAUSES  °· A disorder that makes certain neck glands produce too much parathyroid hormone (primary hyperparathyroidism). °· A buildup of uric acid crystals, similar to gout in your joints. °· Narrowing (stricture) of the ureter. °· A kidney obstruction present at birth (congenital  obstruction). °· Previous surgery on the kidney or ureters. °· Numerous kidney infections. °SYMPTOMS  °· Feeling sick to your stomach (nauseous). °· Throwing up (vomiting). °· Blood in the urine (hematuria). °· Pain that usually spreads (radiates) to the groin. °· Frequency or urgency of urination. °DIAGNOSIS  °· Taking a history and physical exam. °· Blood or urine tests. °· CT scan. °· Occasionally, an examination of the inside of the urinary bladder (cystoscopy) is performed. °TREATMENT  °· Observation. °· Increasing your fluid intake. °· Extracorporeal shock wave lithotripsy--This is a noninvasive procedure that uses shock waves to break up kidney stones. °· Surgery may be needed if you have severe pain or persistent obstruction. There are various surgical procedures. Most of the procedures are performed with the use of small instruments. Only small incisions are needed to accommodate these instruments, so recovery time is minimized. °The size, location, and chemical composition are all important variables that will determine the proper choice of action for you. Talk to your health care provider to better understand your situation so that you will minimize the risk of injury to yourself and your kidney.  °HOME CARE INSTRUCTIONS  °· Drink enough water and fluids to keep your urine clear or pale yellow. This will help you to pass the stone or stone fragments. °· Strain all urine through the provided strainer. Keep all particulate matter and stones for your health care provider to see. The stone causing the pain may be as small as a grain of salt. It is very important to use the strainer each and every time you pass your urine. The collection of your stone will allow your health care provider to analyze it and verify that a stone has actually passed. The stone analysis will often identify what you can do to reduce the incidence of recurrences. °· Only take over-the-counter or prescription medicines for pain,  discomfort, or fever as directed by your health care provider. °· Keep all follow-up visits as told by your health care provider. This is important. °· Get follow-up X-rays if required. The absence of pain does not always mean that the stone has passed. It may have only stopped moving. If the urine remains completely obstructed, it can cause loss of kidney function or even complete destruction of the kidney. It is your responsibility to make sure X-rays and follow-ups are completed. Ultrasounds of the kidney can show blockages and the status of the kidney. Ultrasounds are not associated with any radiation and can be performed easily in a matter of minutes. °· Make changes to your daily diet as told by your health care provider. You may be told to: °¨ Limit the amount of salt that you eat. °¨ Eat 5 or more servings of fruits and vegetables each day. °¨ Limit the amount of meat, poultry, fish, and eggs that you eat. °· Collect a 24-hour urine sample as told by your health care provider. You may need to collect another urine sample every 6-12 months. °SEEK MEDICAL CARE IF: °· You experience pain that is progressive and unresponsive to any pain medicine you have been prescribed. °SEEK IMMEDIATE MEDICAL CARE IF:  °· Pain   cannot be controlled with the prescribed medicine.  You have a fever or shaking chills.  The severity or intensity of pain increases over 18 hours and is not relieved by pain medicine.  You develop a new onset of abdominal pain.  You feel faint or pass out.  You are unable to urinate.   This information is not intended to replace advice given to you by your health care provider. Make sure you discuss any questions you have with your health care provider.   Document Released: 05/31/2005 Document Revised: 02/19/2015 Document Reviewed: 11/01/2012 Elsevier Interactive Patient Education 2016 Elsevier Inc.  Renal Colic Renal colic is pain that is caused by a kidney stone. HOME CARE  Take  medicines only as told by your doctor.  Ask your doctor if it is okay to take over-the-counter medicine for pain.  Drink enough fluid to keep your pee (urine) clear or pale yellow. Drink 6-8 glasses of water each day.  Eat less than 2 grams of salt per day.  Eat less protein. Some foods that have protein are meats, fish, nuts, and dairy.  Try not to eat spinach, rhubarb, nuts, or bran. GET HELP IF:  You have a fever or chills.  Your pee smells bad or looks cloudy.  You have pain or burning when you pee. GET HELP RIGHT AWAY IF:  The pain in your side (flank) or your groin suddenly gets worse.  You get confused.  You pass out.   This information is not intended to replace advice given to you by your health care provider. Make sure you discuss any questions you have with your health care provider.   Document Released: 11/17/2007 Document Revised: 06/21/2014 Document Reviewed: 04/10/2014 Elsevier Interactive Patient Education [employer]

## 2015-07-17 ENCOUNTER — Other Ambulatory Visit: Payer: Self-pay

## 2015-07-17 ENCOUNTER — Encounter: Payer: Self-pay | Admitting: Urology

## 2015-07-17 ENCOUNTER — Ambulatory Visit (INDEPENDENT_AMBULATORY_CARE_PROVIDER_SITE_OTHER): Payer: Self-pay | Admitting: Urology

## 2015-07-17 VITALS — BP 102/70 | HR 103 | Ht 63.0 in | Wt 90.8 lb

## 2015-07-17 DIAGNOSIS — R310 Gross hematuria: Secondary | ICD-10-CM

## 2015-07-17 DIAGNOSIS — N200 Calculus of kidney: Secondary | ICD-10-CM

## 2015-07-17 DIAGNOSIS — N949 Unspecified condition associated with female genital organs and menstrual cycle: Secondary | ICD-10-CM

## 2015-07-17 DIAGNOSIS — N9489 Other specified conditions associated with female genital organs and menstrual cycle: Secondary | ICD-10-CM

## 2015-07-17 LAB — URINALYSIS, COMPLETE
BILIRUBIN UA: NEGATIVE
Glucose, UA: NEGATIVE
KETONES UA: NEGATIVE
LEUKOCYTES UA: NEGATIVE
Nitrite, UA: NEGATIVE
Protein, UA: NEGATIVE
Urobilinogen, Ur: 0.2 mg/dL (ref 0.2–1.0)
pH, UA: 6 (ref 5.0–7.5)

## 2015-07-17 LAB — MICROSCOPIC EXAMINATION

## 2015-07-17 LAB — URINE CULTURE: Culture: 7000

## 2015-07-17 NOTE — Progress Notes (Signed)
07/17/2015 9:49 PM   Megan Bentley [birth year] [id number]  Referring provider: No referring provider defined for this encounter.  Chief Complaint  Patient presents with  . Nephrolithiasis    Referred by ED    HPI: Patient is a 30 year old Caucasian female with bilateral nephrolithiasis who is seen at [facility] emergency room and referred to [place] for further evaluation and management.  Patient spontaneously passed a stone in November 2016. She was then seen in the emergency room on 05/30/2015 complaining of lower back pain, sweats and painful urination.  Her urinalysis during her ER visit was positive for 6-30 RBC's per prior field.  A CT renal stone study completed at that visit noted bilateral nephrolithiasis without obstructive uropathy and possible constipation.  She was started on Pyridium and Macrobid and suggested to follow-up with her primary care physician.    She then returned to the emergency room on 07/15/2015. She had the sudden onset of left flank pain associated with hematuria. She states she had spontaneously passed 2 stones prior to her visit to the emergency room.  Her UA noted TNTC RBC's/hpf.  Urine culture was negative.  A CT renal stone study completed during that visit noted bilateral nephrolithiasis without hydronephrosis. A 3.9 cm low-density structure in the left adnexal region.  Today, she is still experiencing extreme pain in her back.  It is more severe on the left. She is experiencing nausea, night sweats, weight loss and fatigue.  She is having urgency, dysuria, intermittency, hesitancy, straining to urinate and mild stress urinary incontinence.  She does not have a prior history of kidney stone disease.      PMH: Past Medical History  Diagnosis Date  . Migraines   . Renal disorder   . Anxiety   . Kidney stone     Surgical History: Past Surgical History  Procedure Laterality Date  . None    . No past surgeries      Home  Medications:    Medication List       This list is accurate as of: 07/17/15 11:59 PM.  Always use your most recent med list.               ciprofloxacin 500 MG tablet  Commonly known as:  CIPRO  Take 1 tablet (500 mg total) by mouth 2 (two) times daily.     cyclobenzaprine 10 MG tablet  Commonly known as:  FLEXERIL  Take 1 tablet (10 mg total) by mouth 3 (three) times daily as needed for muscle spasms.     ketorolac 10 MG tablet  Commonly known as:  TORADOL  Take 1 tablet (10 mg total) by mouth every 6 (six) hours as needed.     naproxen 500 MG tablet  Commonly known as:  NAPROSYN  Take 500 mg by mouth as needed.     nitrofurantoin (macrocrystal-monohydrate) 100 MG capsule  Commonly known as:  MACROBID  Take 1 capsule (100 mg total) by mouth 2 (two) times daily.     phenazopyridine 200 MG tablet  Commonly known as:  PYRIDIUM  Take 1 tablet (200 mg total) by mouth 3 (three) times daily as needed for pain.     tiZANidine 4 MG tablet  Commonly known as:  ZANAFLEX  Reported on 07/17/2015        Allergies:  Allergies  Allergen Reactions  . Sulfa Antibiotics Anaphylaxis    Family History: Family History  Problem Relation Age of Onset  .  Multiple sclerosis    . Sjogren's syndrome      Aunt kim  . Kidney disease Neg Hx   . Bladder Cancer Neg Hx   . Kidney Stones      Social History:  reports that she has been smoking Cigarettes.  She has a 5.5 pack-year smoking history. She has never used smokeless tobacco. She reports that she uses illicit drugs (Marijuana). She reports that she does not drink alcohol.  ROS: UROLOGY Frequent Urination?: No Hard to postpone urination?: Yes Burning/pain with urination?: Yes Get up at night to urinate?: No Leakage of urine?: Yes Urine stream starts and stops?: Yes Trouble starting stream?: Yes Do you have to strain to urinate?: Yes Blood in urine?: Yes Urinary tract infection?: No Sexually transmitted disease?: No Injury to  kidneys or bladder?: No Painful intercourse?: Yes Weak stream?: Yes Currently pregnant?: No Vaginal bleeding?: No Last menstrual period?: n  Gastrointestinal Nausea?: Yes Vomiting?: No Indigestion/heartburn?: No Diarrhea?: No Constipation?: No  Constitutional Fever: No Night sweats?: Yes Weight loss?: Yes Fatigue?: Yes  Skin Skin rash/lesions?: No Itching?: Yes  Eyes Blurred vision?: No Double vision?: No  Ears/Nose/Throat Sore throat?: No Sinus problems?: Yes  Hematologic/Lymphatic Swollen glands?: No Easy bruising?: No  Cardiovascular Leg swelling?: No Chest pain?: No  Respiratory Cough?: No Shortness of breath?: Yes  Endocrine Excessive thirst?: No  Musculoskeletal Back pain?: Yes Joint pain?: Yes  Neurological Headaches?: Yes Dizziness?: Yes  Psychologic Depression?: No Anxiety?: Yes  Physical Exam: BP 102/70 mmHg  Pulse 103  Ht  (1.6 m)  Wt 90 lb 12.8 oz (41.187 kg)  BMI 16.09 kg/m2  LMP 07/16/2015  Constitutional: Well nourished. Alert and oriented, No acute distress. HEENT: [place] AT, moist mucus membranes. Trachea midline, no masses. Cardiovascular: No clubbing, cyanosis, or edema. Respiratory: Normal respiratory effort, no increased work of breathing. GI: Abdomen is soft, non tender, non distended, no abdominal masses. Liver and spleen not palpable.  No hernias appreciated.  Stool sample for occult testing is not indicated.   GU: Left CVA tenderness.  No bladder fullness or masses.  Normal external genitalia, normal pubic hair distribution, no lesions.  Normal urethral meatus, no lesions, no prolapse, no discharge.   No urethral masses, tenderness and/or tenderness. No bladder fullness, tenderness or masses. Normal vagina mucosa, good estrogen effect, no discharge, no lesions, good pelvic support, no cystocele or rectocele noted.  No cervical motion tenderness.  Uterus is freely mobile and non-fixed.  No adnexal/parametria masses or  tenderness noted.  Anus and perineum are without rashes or lesions.    Skin: No rashes, bruises or suspicious lesions. Lymph: No cervical or inguinal adenopathy. Neurologic: Grossly intact, no focal deficits, moving all 4 extremities. Psychiatric: Normal mood and affect.  Laboratory Data: Lab Results  Component Value Date   WBC 10.8 07/15/2015   HGB 13.8 07/15/2015   HCT 40.7 07/15/2015   MCV 98.4 07/15/2015   PLT 241 07/15/2015    Lab Results  Component Value Date   CREATININE 0.61 07/15/2015    Lab Results  Component Value Date   AST 63* 07/15/2015   Lab Results  Component Value Date   ALT 37 07/15/2015   Urinalysis Results for orders placed or performed in visit on 07/17/15  CULTURE, URINE COMPREHENSIVE  Result Value Ref Range   Urine Culture, Comprehensive Final report    Result 1 Comment   Microscopic Examination  Result Value Ref Range   WBC, UA 0-5 0 -  5 /hpf  RBC, UA 3-10 (A) 0 -  2 /hpf   Epithelial Cells (non renal) 0-10 0 - 10 /hpf   Bacteria, UA Few (A) None seen/Few  Urinalysis, Complete  Result Value Ref Range   Specific Gravity, UA <1.005 (L) 1.005 - 1.030   pH, UA 6.0 5.0 - 7.5   Color, UA Yellow Yellow   Appearance Ur Clear Clear   Leukocytes, UA Negative Negative   Protein, UA Negative Negative/Trace   Glucose, UA Negative Negative   Ketones, UA Negative Negative   RBC, UA 3+ (A) Negative   Bilirubin, UA Negative Negative   Urobilinogen, Ur 0.2 0.2 - 1.0 mg/dL   Nitrite, UA Negative Negative   Microscopic Examination See below:     Pertinent Imaging: CLINICAL DATA: Left flank pain since yesterday. History of kidney stones. Hematuria and dysuria.  EXAM: CT ABDOMEN AND PELVIS WITHOUT CONTRAST  TECHNIQUE: Multidetector CT imaging of the abdomen and pelvis was performed following the standard protocol without IV contrast.  COMPARISON: 05/30/2015  FINDINGS: Lower chest: Lung bases are clear.  Hepatobiliary: Normal  appearance of the liver. Gallbladder is small and decompressed.  Pancreas: No gross abnormality to the pancreas but limited evaluation due to the lack of adjacent fat.  Spleen: Normal appearance of the spleen without enlargement.  Adrenals/Urinary Tract: Normal appearance of the adrenal glands. There are at least 5 right kidney stones without hydronephrosis. Largest right kidney stone measures roughly 5 mm. Normal appearance of the urinary bladder. Multiple left renal stones and the largest is in the upper/interpolar region measuring close to 8 mm. There is no evidence for left hydronephrosis or left ureter stones. There is no significant perinephric stranding.  Stomach/Bowel: No gross abnormality to the stomach, small bowel or colon.  Vascular/Lymphatic: Limited evaluation for lymphadenopathy due to the lack intravenous contrast and lack of intra-abdominal fat.  Reproductive: There is a 3.9 cm low-density structure in the region of the left adnexa and this could represent a left ovarian cyst. No gross abnormality to the uterus or right adnexa.  Other: There is no significant free fluid.  Musculoskeletal: No acute bone abnormality.  IMPRESSION: Bilateral nephrolithiasis without hydronephrosis.  3.9 cm low-density structure in the left adnexa region. This could represent an ovarian cyst. If this is an area of clinical concern, consider further characterization with ultrasound.   Electronically Signed  By: Richarda Overlie M.D.  On: 07/15/2015 21:54        Assessment & Plan:    1. Kidney stones:   I explained to the patient that theoretically stones that are not obstructing should not cause pain.  She did have left CVA tenderness on today's exam.  Her UA was positive for 3-10 RBCs per high-power field, but she is currently on her menses. I will send it for culture to rule out pyelonephritis.  She has spontaneously passed 3 stones since November 2016.  She is a  mother of a small child and is finding it difficult to give the care to her daughter because of the pain.  She would like to have her stones removed.  I emphasized to her that she would have to undergo several procedures to completely rid her of her stone burden.  I explained that we can only address one kidney at a time.  That after the procedure she would have an indwelling stent for 3-5 days and stents can be uncomfortable as well.  I also stated that since the stones were nonobstructing on either CT scans the  cause of her pain may not be due to nephrolithiasis. It could be a strong possibility that she would undergo surgical procedures to remove the stones and still have her pain.  She voiced her understanding and would like to undergo the procedures.  I explained to the patient how the procedure is performed and the risks involved.    I informed patient that she will have a stent placed during the procedure and will remain in place after the procedure for a short time.  It will be removed in the office with a cystoscope, unless a string in left in place.  I informed that patient that about 50% of patients who undergo ureteroscopy and have a stent will have "stent pain," and this is by far the most common risk/complaint following ureteroscopy. A stent is a soft plastic tube (about half the size of IV tubing) that allows the kidney to drain to the bladder regardless of edema or obstruction. Not only can the stent "rub" on the inside of the bladder, causing a feeling of needing to urinate/overactive bladder, but also the stent allows urine to pass up from the bladder to the kidney during urination - causing symptoms from a warm, tingling sensation to intense pain in the affected flank.   They may be residual stones within the kidney or ureter may be present up to 40% of the time following ureteroscopy, depending on the original stone size and location. These stone fragments will be seen and addressed on  follow-up imaging.  Injury to the ureter is the most common intra-operative complication during ureteroscopy. The reported risk of perforation ranges greatly, depending on whether it is defined as a complete perforation (0.1-0.7% - think of this as a hole through the entire ureter), a partial perforation (1.6% - a hole nearly through the entire ureter), or mucosal tear/scrape (5% - these are similar to a sore on the inside of the mouth). Almost 100% of these will heal with prolonged stenting (anywhere between 2 - 4 weeks). Should a large perforation occur, your urologist may chose to stop the procedure and return on another day when the ureter has had time to heal.  Since she is experiencing left flank tenderness on exam, she will be scheduled for a cystoscopy with left URS/LL/L ureteral stent placement first with bilateral retrogrades to complete her hematuria work up as well.    - Urinalysis, Complete - CULTURE, URINE COMPREHENSIVE  2. Left adnexa mass:   [facility] evaluation and management.  3. Gross hematuria:   Patient will be undergoing surgical procedure to remove her stones in the future. A cystoscopy with retrogrades will be performed at that time as well.     Return for schedule surgery.  These notes generated with voice recognition software. I apologize for typographical errors.  Michiel Cowboy, PA-C  [facility] [address] [place], [place] [address] [phone]

## 2015-07-18 ENCOUNTER — Telehealth: Payer: Self-pay | Admitting: Radiology

## 2015-07-18 NOTE — Telephone Encounter (Signed)
Pt notified of surgery scheduled 08/06/15, pre-admit phone interview on 2/10 between 9am-1pm and to call day prior to surgery for arrival time to [facility]. Pt voices understanding.

## 2015-07-19 DIAGNOSIS — R310 Gross hematuria: Secondary | ICD-10-CM | POA: Insufficient documentation

## 2015-07-19 DIAGNOSIS — N9489 Other specified conditions associated with female genital organs and menstrual cycle: Secondary | ICD-10-CM | POA: Insufficient documentation

## 2015-07-19 DIAGNOSIS — N200 Calculus of kidney: Secondary | ICD-10-CM | POA: Insufficient documentation

## 2015-07-19 LAB — CULTURE, URINE COMPREHENSIVE

## 2015-07-25 ENCOUNTER — Other Ambulatory Visit: Payer: Self-pay

## 2015-07-25 ENCOUNTER — Encounter: Payer: Self-pay | Admitting: *Deleted

## 2015-07-25 NOTE — Patient Instructions (Signed)
  Your procedure is scheduled on: 08-06-15 Report to [facility] SAME [facility] 2ND FLOOR To find out your arrival time please call [phone] between 1PM - 3PM on 08-05-15  Remember: Instructions that are not followed completely may result in serious medical risk, up to and including death, or upon the discretion of your surgeon and anesthesiologist your surgery may need to be rescheduled.    _X___ 1. Do not eat food or drink liquids after midnight. No gum chewing or hard candies.     _X___ 2. No Alcohol for 24 hours before or after surgery.   ____ 3. Bring all medications with you on the day of surgery if instructed.    ____ 4. Notify your doctor if there is any change in your medical condition     (cold, fever, infections).     Do not wear jewelry, make-up, hairpins, clips or nail polish.  Do not wear lotions, powders, or perfumes. You may wear deodorant.  Do not shave 48 hours prior to surgery. Men may shave face and neck.  Do not bring valuables to the hospital.    [facility] is not responsible for any belongings or valuables.               Contacts, dentures or bridgework may not be worn into surgery.  Leave your suitcase in the car. After surgery it may be brought to your room.  For patients admitted to the hospital, discharge time is determined by your treatment team.   Patients discharged the day of surgery will not be allowed to drive home.   Please read over the following fact sheets that you were given:     ____ Take these medicines the morning of surgery with A SIP OF WATER:    1. NONE  2.   3.   4.  5.  6.  ____ Fleet Enema (as directed)   ____ Use CHG Soap as directed  ____ Use inhalers on the day of surgery  ____ Stop metformin 2 days prior to surgery    ____ Take 1/2 of usual insulin dose the night before surgery and none on the morning of surgery.   ____ Stop Coumadin/Plavix/aspirin-N/A  _X___ Stop Anti-inflammatories-STOP NAPROXEN 7 DAYS  PRIOR TO SURGERY-NO NSAIDS OR ASA PRODUCTS-TYLENOL OK TO TAKE   ____ Stop supplements until after surgery.    ____ Bring C-Pap to the hospital.

## 2015-07-30 ENCOUNTER — Encounter: Payer: Self-pay | Admitting: Obstetrics and Gynecology

## 2015-07-31 ENCOUNTER — Encounter: Payer: Self-pay | Admitting: Obstetrics and Gynecology

## 2015-08-06 ENCOUNTER — Encounter: Payer: Self-pay | Admitting: *Deleted

## 2015-08-06 ENCOUNTER — Ambulatory Visit: Payer: Self-pay | Admitting: Anesthesiology

## 2015-08-06 ENCOUNTER — Ambulatory Visit
Admission: RE | Admit: 2015-08-06 | Discharge: 2015-08-06 | Disposition: A | Discharge status: Home / Self Care | Payer: Self-pay | Source: Ambulatory Visit | Attending: Urology | Admitting: Urology

## 2015-08-06 ENCOUNTER — Encounter
Admission: RE | Disposition: A | Discharge status: Home / Self Care | Payer: Self-pay | Source: Ambulatory Visit | Attending: Urology

## 2015-08-06 DIAGNOSIS — Z79899 Other long term (current) drug therapy: Secondary | ICD-10-CM | POA: Insufficient documentation

## 2015-08-06 DIAGNOSIS — F17210 Nicotine dependence, cigarettes, uncomplicated: Secondary | ICD-10-CM | POA: Insufficient documentation

## 2015-08-06 DIAGNOSIS — G43909 Migraine, unspecified, not intractable, without status migrainosus: Secondary | ICD-10-CM | POA: Insufficient documentation

## 2015-08-06 DIAGNOSIS — R310 Gross hematuria: Secondary | ICD-10-CM | POA: Insufficient documentation

## 2015-08-06 DIAGNOSIS — F419 Anxiety disorder, unspecified: Secondary | ICD-10-CM | POA: Insufficient documentation

## 2015-08-06 DIAGNOSIS — Z8489 Family history of other specified conditions: Secondary | ICD-10-CM | POA: Insufficient documentation

## 2015-08-06 DIAGNOSIS — Z87442 Personal history of urinary calculi: Secondary | ICD-10-CM | POA: Insufficient documentation

## 2015-08-06 DIAGNOSIS — Z882 Allergy status to sulfonamides status: Secondary | ICD-10-CM | POA: Insufficient documentation

## 2015-08-06 DIAGNOSIS — R3915 Urgency of urination: Secondary | ICD-10-CM | POA: Insufficient documentation

## 2015-08-06 DIAGNOSIS — M545 Low back pain: Secondary | ICD-10-CM | POA: Insufficient documentation

## 2015-08-06 DIAGNOSIS — N200 Calculus of kidney: Secondary | ICD-10-CM | POA: Insufficient documentation

## 2015-08-06 DIAGNOSIS — Z820 Family history of epilepsy and other diseases of the nervous system: Secondary | ICD-10-CM | POA: Insufficient documentation

## 2015-08-06 DIAGNOSIS — R109 Unspecified abdominal pain: Secondary | ICD-10-CM | POA: Insufficient documentation

## 2015-08-06 DIAGNOSIS — R300 Dysuria: Secondary | ICD-10-CM | POA: Insufficient documentation

## 2015-08-06 HISTORY — PX: CYSTOSCOPY/URETEROSCOPY/HOLMIUM LASER/STENT PLACEMENT: SHX6546

## 2015-08-06 HISTORY — PX: CYSTOSCOPY W/ RETROGRADES: SHX1426

## 2015-08-06 LAB — URINE DRUG SCREEN, QUALITATIVE (ARMC ONLY)
Amphetamines, Ur Screen: NOT DETECTED
BARBITURATES, UR SCREEN: NOT DETECTED
Benzodiazepine, Ur Scrn: NOT DETECTED
CANNABINOID 50 NG, UR ~~LOC~~: POSITIVE — AB
COCAINE METABOLITE, UR ~~LOC~~: NOT DETECTED
MDMA (Ecstasy)Ur Screen: NOT DETECTED
Methadone Scn, Ur: NOT DETECTED
OPIATE, UR SCREEN: NOT DETECTED
Phencyclidine (PCP) Ur S: NOT DETECTED
Tricyclic, Ur Screen: NOT DETECTED

## 2015-08-06 LAB — POCT PREGNANCY, URINE: Preg Test, Ur: NEGATIVE

## 2015-08-06 SURGERY — CYSTOSCOPY/URETEROSCOPY/HOLMIUM LASER/STENT PLACEMENT
Anesthesia: General | Laterality: Left | Wound class: Clean Contaminated

## 2015-08-06 MED ORDER — LIDOCAINE HCL (CARDIAC) 20 MG/ML IV SOLN
INTRAVENOUS | Status: DC | PRN
  Administered 2015-08-06: 40 mg via INTRAVENOUS

## 2015-08-06 MED ORDER — TRAMADOL HCL 50 MG PO TABS: 50.0000 mg | ORAL_TABLET | Freq: Four times a day (QID) | ORAL | Status: DC | PRN

## 2015-08-06 MED ORDER — PROPOFOL 10 MG/ML IV BOLUS
INTRAVENOUS | Status: DC | PRN
  Administered 2015-08-06: 150 mg via INTRAVENOUS

## 2015-08-06 MED ORDER — LACTATED RINGERS IV SOLN
INTRAVENOUS | Status: DC
  Administered 2015-08-06 (×2): via INTRAVENOUS

## 2015-08-06 MED ORDER — ONDANSETRON HCL 4 MG/2ML IJ SOLN: 4.0000 mg | Freq: Once | INTRAMUSCULAR | Status: DC | PRN

## 2015-08-06 MED ORDER — DEXAMETHASONE SODIUM PHOSPHATE 10 MG/ML IJ SOLN
INTRAMUSCULAR | Status: DC | PRN
  Administered 2015-08-06: 8 mg via INTRAVENOUS

## 2015-08-06 MED ORDER — ACETAMINOPHEN 10 MG/ML IV SOLN
INTRAVENOUS | Status: DC | PRN
  Administered 2015-08-06: 1000 mg via INTRAVENOUS

## 2015-08-06 MED ORDER — CEFAZOLIN SODIUM 1-5 GM-% IV SOLN
INTRAVENOUS | Status: AC
  Filled 2015-08-06: qty 50

## 2015-08-06 MED ORDER — FAMOTIDINE 20 MG PO TABS
20.0000 mg | ORAL_TABLET | Freq: Once | ORAL | Status: AC
  Administered 2015-08-06: 20 mg via ORAL

## 2015-08-06 MED ORDER — PHENAZOPYRIDINE HCL 200 MG PO TABS: 200.0000 mg | ORAL_TABLET | Freq: Three times a day (TID) | ORAL | Status: DC | PRN

## 2015-08-06 MED ORDER — FENTANYL CITRATE (PF) 100 MCG/2ML IJ SOLN: 25.0000 ug | INTRAMUSCULAR | Status: DC | PRN

## 2015-08-06 MED ORDER — FENTANYL CITRATE (PF) 100 MCG/2ML IJ SOLN
INTRAMUSCULAR | Status: DC | PRN
  Administered 2015-08-06 (×2): 50 ug via INTRAVENOUS

## 2015-08-06 MED ORDER — CEFAZOLIN SODIUM 1-5 GM-% IV SOLN
1.0000 g | Freq: Once | INTRAVENOUS | Status: AC
  Administered 2015-08-06: 1 g via INTRAVENOUS

## 2015-08-06 MED ORDER — OXYBUTYNIN CHLORIDE 5 MG PO TABS: 5.0000 mg | ORAL_TABLET | Freq: Three times a day (TID) | ORAL | Status: DC | PRN

## 2015-08-06 MED ORDER — SUCCINYLCHOLINE CHLORIDE 20 MG/ML IJ SOLN
INTRAMUSCULAR | Status: DC | PRN
  Administered 2015-08-06: 120 mg via INTRAVENOUS
  Administered 2015-08-06: 20 mg via INTRAVENOUS

## 2015-08-06 MED ORDER — KETOROLAC TROMETHAMINE 30 MG/ML IJ SOLN
INTRAMUSCULAR | Status: DC | PRN
  Administered 2015-08-06: 30 mg via INTRAVENOUS

## 2015-08-06 MED ORDER — FAMOTIDINE 20 MG PO TABS
ORAL_TABLET | ORAL | Status: AC
  Filled 2015-08-06: qty 1

## 2015-08-06 MED ORDER — SUGAMMADEX SODIUM 200 MG/2ML IV SOLN
INTRAVENOUS | Status: DC | PRN
  Administered 2015-08-06: 200 mg via INTRAVENOUS

## 2015-08-06 MED ORDER — ACETAMINOPHEN 10 MG/ML IV SOLN
INTRAVENOUS | Status: AC
  Filled 2015-08-06: qty 100

## 2015-08-06 MED ORDER — ROCURONIUM BROMIDE 100 MG/10ML IV SOLN
INTRAVENOUS | Status: DC | PRN
  Administered 2015-08-06: 10 mg via INTRAVENOUS
  Administered 2015-08-06: 30 mg via INTRAVENOUS

## 2015-08-06 MED ORDER — ONDANSETRON HCL 4 MG/2ML IJ SOLN
INTRAMUSCULAR | Status: DC | PRN
  Administered 2015-08-06: 4 mg via INTRAVENOUS

## 2015-08-06 SURGICAL SUPPLY — 31 items
ADAPTER IRRIG TUBE 2 SPIKE SOL (ADAPTER) ×3 IMPLANT
ADAPTER SCOPE UROLOK II (MISCELLANEOUS) IMPLANT
BAG DRAIN CYSTO-URO LG1000N (MISCELLANEOUS) ×3 IMPLANT
BASKET ZERO TIP 1.9FR (BASKET) ×3 IMPLANT
CATH URETL 5X70 OPEN END (CATHETERS) ×3 IMPLANT
CNTNR SPEC 2.5X3XGRAD LEK (MISCELLANEOUS) ×2
CONRAY 43 FOR UROLOGY 50M (MISCELLANEOUS) ×3 IMPLANT
CONT SPEC 4OZ STER OR WHT (MISCELLANEOUS) ×1
CONTAINER SPEC 2.5X3XGRAD LEK (MISCELLANEOUS) ×2 IMPLANT
GLOVE BIO SURGEON STRL SZ 6.5 (GLOVE) ×3 IMPLANT
GLOVE BIO SURGEON STRL SZ7 (GLOVE) ×6 IMPLANT
GOWN STRL REUS W/ TWL LRG LVL3 (GOWN DISPOSABLE) ×4 IMPLANT
GOWN STRL REUS W/TWL LRG LVL3 (GOWN DISPOSABLE) ×2
INTRODUCER DILATOR DOUBLE (INTRODUCER) IMPLANT
IV NS 1000ML (IV SOLUTION) ×2
IV NS 1000ML BAXH (IV SOLUTION) ×4 IMPLANT
KIT RM TURNOVER CYSTO AR (KITS) ×3 IMPLANT
LASER FIBER 200M SMARTSCOPE (Laser) ×3 IMPLANT
PACK CYSTO AR (MISCELLANEOUS) ×3 IMPLANT
PREP PVP WINGED SPONGE (MISCELLANEOUS) ×3 IMPLANT
PUMP SINGLE ACTION SAP (PUMP) IMPLANT
SENSORWIRE 0.038 NOT ANGLED (WIRE) ×6
SET CYSTO W/LG BORE CLAMP LF (SET/KITS/TRAYS/PACK) ×3 IMPLANT
SHEATH URETERAL 12FRX35CM (MISCELLANEOUS) ×3 IMPLANT
SOL .9 NS 3000ML IRR  AL (IV SOLUTION)
SOL .9 NS 3000ML IRR UROMATIC (IV SOLUTION) IMPLANT
STENT URET 6FRX24 CONTOUR (STENTS) ×3 IMPLANT
STENT URET 6FRX26 CONTOUR (STENTS) IMPLANT
SURGILUBE 2OZ TUBE FLIPTOP (MISCELLANEOUS) ×3 IMPLANT
WATER STERILE IRR 1000ML POUR (IV SOLUTION) ×3 IMPLANT
WIRE SENSOR 0.038 NOT ANGLED (WIRE) ×4 IMPLANT

## 2015-08-06 NOTE — Progress Notes (Signed)
Pt unable to void on arrival to preop - not able to void until 1015 am - sent specimen to lab for UDS, they will notify us if not enough urine, per patient "that's all I could push out".  pOCT upreg neg preop.

## 2015-08-06 NOTE — Transfer of Care (Signed)
Immediate Anesthesia Transfer of Care Note  Patient: Megan Bentley  Procedure(s) Performed: Procedure(s): CYSTOSCOPY/URETEROSCOPY/HOLMIUM LASER/STENT PLACEMENT (Left) CYSTOSCOPY WITH RETROGRADE PYELOGRAM (Bilateral)  Patient Location: PACU  Anesthesia Type:General  Level of Consciousness: awake  Airway & Oxygen Therapy: Patient Spontanous Breathing and Patient connected to face mask oxygen  Post-op Assessment: Report given to RN and Post -op Vital signs reviewed and stable  Post vital signs: Reviewed and stable  Last Vitals:  Filed Vitals:   08/06/15 0928 08/06/15 1253  BP: 108/70 114/78  Pulse: 86 94  Temp: 36.9 C 36.4 C  Resp: 16 9    Complications: No apparent anesthesia complications

## 2015-08-06 NOTE — Anesthesia Procedure Notes (Signed)
Procedure Name: Intubation Date/Time: 08/06/2015 11:45 AM Performed by: Henrietta Hoover Pre-anesthesia Checklist: Patient identified, Emergency Drugs available, Suction available, Patient being monitored and Timeout performed Patient Re-evaluated:Patient Re-evaluated prior to inductionOxygen Delivery Method: Circle system utilized Preoxygenation: Pre-oxygenation with 100% oxygen Intubation Type: IV induction LMA: LMA inserted Laryngoscope Size: Mac and 3 Grade View: Grade I Tube size: 7.0 mm Number of attempts: 1 Airway Equipment and Method: Stylet Placement Confirmation: ETT inserted through vocal cords under direct vision,  positive ETCO2 and breath sounds checked- equal and bilateral Secured at: 20 cm Tube secured with: Tape Dental Injury: Teeth and Oropharynx as per pre-operative assessment

## 2015-08-06 NOTE — Op Note (Signed)
Date of procedure: 08/06/2015  Preoperative diagnosis:  1. Bilateral nonobstructing nephrolithiasis 2. Hematuria   Postoperative diagnosis:  1. Same as above   Procedure: 1. Cystoscopy 2. Bilateral retrograde pyelogram 3. Left ureteroscopy 4. Left laser lithotripsy 5. Left basket extraction of Stone fragment 6. Left ureteral stent placement  Surgeon: Hollice Espy, MD  Anesthesia: General  Complications: None  Intraoperative findings: Multiple left nonobstructing stones, largest 8 mm in the upper midpole.  EBL: Minimal  Specimens: Stone fragment  Drains: 6 x 24 French double-J ureteral stent on left (string in place)  Indication: Megan Bentley is a 30 y.o. patient with multiple bilateral nonobstructing stones.  After reviewing the management options for treatment, she elected to proceed with the above surgical procedure(s). We have discussed the potential benefits and risks of the procedure, side effects of the proposed treatment, the likelihood of the patient achieving the goals of the procedure, and any potential problems that might occur during the procedure or recuperation. Informed consent has been obtained.  Description of procedure:  The patient was taken to the operating room and general anesthesia was induced.  The patient was placed in the dorsal lithotomy position, prepped and draped in the usual sterile fashion, and preoperative antibiotics were administered. A preoperative time-out was performed.   A 21 French rigid cystoscope was advanced per urethra into the bladder. The bladder was carefully inspected and noted to be normal without any lesions. The trigone was also normal with bilateral ureteral orifices in normal anatomic position. Attention was then turned to the right ureteral orifice which was intubated using a 5 [place] open-ended ureteral catheter. A gentle retropyelogram is performed revealing a delicate appearing ureter without hydronephrosis or filling  defects. Attention was then turned to the left ureteral orifice and the same procedure was performed.  Left retrograde pyelogram revealed again a delicate appearing ureter with a normal upper tract collecting system without filling defects or hydronephrosis.  At this point time, a sensor wire was placed up to level of the kidney under fluoroscopic guidance. A second Super Stiff wire was placed using the same technique. I then attempted to advance a dual channel flexible Wolf ureteroscope up to the kidney however met resistance at the UO. Next, I was successful in placing a Cook ureteral access sheath over the Super Stiff wire up to level of the proximal ureter meeting minimal resistance. The inner cannula was removed and the superstiff was able to be advanced into the renal pelvis. Each and every calyx was then directly visualized. Number of very small nonobstructing calculi were basket extracted out of the collecting system proximally 6 or so of these. The larger 8 mm stone was identified and upper midpole calyx. A 200  laser fiber was then brought in and using settings of 0.8 J and 10 Hz, the stone was fragmented into approximately 4 pieces. Each of the pieces was then basket extracted out of the kidney. Surveillance of each and every calyx at this point revealed no residual stone fragments. The access sheath was then scoped out of the ureter insuring that there was no ureteral trauma. The ureter appeared to be normal without edema, injury, or stretching fragments. A 6 x 24 French double-J ureteral stent was advanced over the sensor wire up to level of the kidney under fluoroscopic guidance. The wire was partially withdrawn until the proximal coil was noted to hook over the upper pole calyx. The wire was then fully withdrawn until coil was noted within the bladder  under fluoroscopic guidance. The string was left in place. The bladder was drained using the sheath visit rigid cystoscope. The patient was then  cleaned and dried. The stent string was secured to the patient's right inner thigh using Mastisol and Tegaderm. She was reversed from anesthesia and taken the PACU in stable condition.   Plan: Patient will remove her own stent in 3 days. She will follow up in our office in 4 weeks with RUS prior.    Hollice Espy, M.D.

## 2015-08-06 NOTE — H&P (View-Only) (Signed)
07/17/2015 9:49 PM   [name] [birth year] [id number]  Referring provider: No referring provider defined for this encounter.  Chief Complaint  Patient presents with  . Nephrolithiasis    Referred by ED    HPI: Patient is a 30 year old Caucasian female with bilateral nephrolithiasis who is seen at [facility] emergency room and referred to [place] for further evaluation and management.  Patient spontaneously passed a stone in November 2016. She was then seen in the emergency room on 05/30/2015 complaining of lower back pain, sweats and painful urination.  Her urinalysis during her ER visit was positive for 6-30 RBC's per prior field.  A CT renal stone study completed at that visit noted bilateral nephrolithiasis without obstructive uropathy and possible constipation.  She was started on Pyridium and Macrobid and suggested to follow-up with her primary care physician.    She then returned to the emergency room on 07/15/2015. She had the sudden onset of left flank pain associated with hematuria. She states she had spontaneously passed 2 stones prior to her visit to the emergency room.  Her UA noted TNTC RBC's/hpf.  Urine culture was negative.  A CT renal stone study completed during that visit noted bilateral nephrolithiasis without hydronephrosis. A 3.9 cm low-density structure in the left adnexal region.  Today, she is still experiencing extreme pain in her back.  It is more severe on the left. She is experiencing nausea, night sweats, weight loss and fatigue.  She is having urgency, dysuria, intermittency, hesitancy, straining to urinate and mild stress urinary incontinence.  She does not have a prior history of kidney stone disease.      PMH: Past Medical History  Diagnosis Date  . Migraines   . Renal disorder   . Anxiety   . Kidney stone     Surgical History: Past Surgical History  Procedure Laterality Date  . None    . No past surgeries      Home  Medications:    Medication List       This list is accurate as of: 07/17/15 11:59 PM.  Always use your most recent med list.               ciprofloxacin 500 MG tablet  Commonly known as:  CIPRO  Take 1 tablet (500 mg total) by mouth 2 (two) times daily.     cyclobenzaprine 10 MG tablet  Commonly known as:  FLEXERIL  Take 1 tablet (10 mg total) by mouth 3 (three) times daily as needed for muscle spasms.     ketorolac 10 MG tablet  Commonly known as:  TORADOL  Take 1 tablet (10 mg total) by mouth every 6 (six) hours as needed.     naproxen 500 MG tablet  Commonly known as:  NAPROSYN  Take 500 mg by mouth as needed.     nitrofurantoin (macrocrystal-monohydrate) 100 MG capsule  Commonly known as:  MACROBID  Take 1 capsule (100 mg total) by mouth 2 (two) times daily.     phenazopyridine 200 MG tablet  Commonly known as:  PYRIDIUM  Take 1 tablet (200 mg total) by mouth 3 (three) times daily as needed for pain.     tiZANidine 4 MG tablet  Commonly known as:  ZANAFLEX  Reported on 07/17/2015        Allergies:  Allergies  Allergen Reactions  . Sulfa Antibiotics Anaphylaxis    Family History: Family History  Problem Relation Age of Onset  .  Multiple sclerosis    . Sjogren's syndrome      Aunt kim  . Kidney disease Neg Hx   . Bladder Cancer Neg Hx   . Kidney Stones      Social History:  reports that she has been smoking Cigarettes.  She has a 5.5 pack-year smoking history. She has never used smokeless tobacco. She reports that she uses illicit drugs (Marijuana). She reports that she does not drink alcohol.  ROS: UROLOGY Frequent Urination?: No Hard to postpone urination?: Yes Burning/pain with urination?: Yes Get up at night to urinate?: No Leakage of urine?: Yes Urine stream starts and stops?: Yes Trouble starting stream?: Yes Do you have to strain to urinate?: Yes Blood in urine?: Yes Urinary tract infection?: No Sexually transmitted disease?: No Injury to  kidneys or bladder?: No Painful intercourse?: Yes Weak stream?: Yes Currently pregnant?: No Vaginal bleeding?: No Last menstrual period?: n  Gastrointestinal Nausea?: Yes Vomiting?: No Indigestion/heartburn?: No Diarrhea?: No Constipation?: No  Constitutional Fever: No Night sweats?: Yes Weight loss?: Yes Fatigue?: Yes  Skin Skin rash/lesions?: No Itching?: Yes  Eyes Blurred vision?: No Double vision?: No  Ears/Nose/Throat Sore throat?: No Sinus problems?: Yes  Hematologic/Lymphatic Swollen glands?: No Easy bruising?: No  Cardiovascular Leg swelling?: No Chest pain?: No  Respiratory Cough?: No Shortness of breath?: Yes  Endocrine Excessive thirst?: No  Musculoskeletal Back pain?: Yes Joint pain?: Yes  Neurological Headaches?: Yes Dizziness?: Yes  Psychologic Depression?: No Anxiety?: Yes  Physical Exam: BP 102/70 mmHg  Pulse 103  Ht  (1.6 m)  Wt 90 lb 12.8 oz (41.187 kg)  BMI 16.09 kg/m2  LMP 07/16/2015  Constitutional: Well nourished. Alert and oriented, No acute distress. HEENT: [place] AT, moist mucus membranes. Trachea midline, no masses. Cardiovascular: No clubbing, cyanosis, or edema. Respiratory: Normal respiratory effort, no increased work of breathing. GI: Abdomen is soft, non tender, non distended, no abdominal masses. Liver and spleen not palpable.  No hernias appreciated.  Stool sample for occult testing is not indicated.   GU: Left CVA tenderness.  No bladder fullness or masses.  Normal external genitalia, normal pubic hair distribution, no lesions.  Normal urethral meatus, no lesions, no prolapse, no discharge.   No urethral masses, tenderness and/or tenderness. No bladder fullness, tenderness or masses. Normal vagina mucosa, good estrogen effect, no discharge, no lesions, good pelvic support, no cystocele or rectocele noted.  No cervical motion tenderness.  Uterus is freely mobile and non-fixed.  No adnexal/parametria masses or  tenderness noted.  Anus and perineum are without rashes or lesions.    Skin: No rashes, bruises or suspicious lesions. Lymph: No cervical or inguinal adenopathy. Neurologic: Grossly intact, no focal deficits, moving all 4 extremities. Psychiatric: Normal mood and affect.  Laboratory Data: Lab Results  Component Value Date   WBC 10.8 07/15/2015   HGB 13.8 07/15/2015   HCT 40.7 07/15/2015   MCV 98.4 07/15/2015   PLT 241 07/15/2015    Lab Results  Component Value Date   CREATININE 0.61 07/15/2015    Lab Results  Component Value Date   AST 63* 07/15/2015   Lab Results  Component Value Date   ALT 37 07/15/2015   Urinalysis Results for orders placed or performed in visit on 07/17/15  CULTURE, URINE COMPREHENSIVE  Result Value Ref Range   Urine Culture, Comprehensive Final report    Result 1 Comment   Microscopic Examination  Result Value Ref Range   WBC, UA 0-5 0 -  5 /hpf  RBC, UA 3-10 (A) 0 -  2 /hpf   Epithelial Cells (non renal) 0-10 0 - 10 /hpf   Bacteria, UA Few (A) None seen/Few  Urinalysis, Complete  Result Value Ref Range   Specific Gravity, UA <1.005 (L) 1.005 - 1.030   pH, UA 6.0 5.0 - 7.5   Color, UA Yellow Yellow   Appearance Ur Clear Clear   Leukocytes, UA Negative Negative   Protein, UA Negative Negative/Trace   Glucose, UA Negative Negative   Ketones, UA Negative Negative   RBC, UA 3+ (A) Negative   Bilirubin, UA Negative Negative   Urobilinogen, Ur 0.2 0.2 - 1.0 mg/dL   Nitrite, UA Negative Negative   Microscopic Examination See below:     Pertinent Imaging: CLINICAL DATA: Left flank pain since yesterday. History of kidney stones. Hematuria and dysuria.  EXAM: CT ABDOMEN AND PELVIS WITHOUT CONTRAST  TECHNIQUE: Multidetector CT imaging of the abdomen and pelvis was performed following the standard protocol without IV contrast.  COMPARISON: 05/30/2015  FINDINGS: Lower chest: Lung bases are clear.  Hepatobiliary: Normal  appearance of the liver. Gallbladder is small and decompressed.  Pancreas: No gross abnormality to the pancreas but limited evaluation due to the lack of adjacent fat.  Spleen: Normal appearance of the spleen without enlargement.  Adrenals/Urinary Tract: Normal appearance of the adrenal glands. There are at least 5 right kidney stones without hydronephrosis. Largest right kidney stone measures roughly 5 mm. Normal appearance of the urinary bladder. Multiple left renal stones and the largest is in the upper/interpolar region measuring close to 8 mm. There is no evidence for left hydronephrosis or left ureter stones. There is no significant perinephric stranding.  Stomach/Bowel: No gross abnormality to the stomach, small bowel or colon.  Vascular/Lymphatic: Limited evaluation for lymphadenopathy due to the lack intravenous contrast and lack of intra-abdominal fat.  Reproductive: There is a 3.9 cm low-density structure in the region of the left adnexa and this could represent a left ovarian cyst. No gross abnormality to the uterus or right adnexa.  Other: There is no significant free fluid.  Musculoskeletal: No acute bone abnormality.  IMPRESSION: Bilateral nephrolithiasis without hydronephrosis.  3.9 cm low-density structure in the left adnexa region. This could represent an ovarian cyst. If this is an area of clinical concern, consider further characterization with ultrasound.   Electronically Signed  By: Richarda Overlie M.D.  On: 07/15/2015 21:54        Assessment & Plan:    1. Kidney stones:   I explained to the patient that theoretically stones that are not obstructing should not cause pain.  She did have left CVA tenderness on today's exam.  Her UA was positive for 3-10 RBCs per high-power field, but she is currently on her menses. I will send it for culture to rule out pyelonephritis.  She has spontaneously passed 3 stones since November 2016.  She is a  mother of a small child and is finding it difficult to give the care to her daughter because of the pain.  She would like to have her stones removed.  I emphasized to her that she would have to undergo several procedures to completely rid her of her stone burden.  I explained that we can only address one kidney at a time.  That after the procedure she would have an indwelling stent for 3-5 days and stents can be uncomfortable as well.  I also stated that since the stones were nonobstructing on either CT scans the  cause of her pain may not be due to nephrolithiasis. It could be a strong possibility that she would undergo surgical procedures to remove the stones and still have her pain.  She voiced her understanding and would like to undergo the procedures.  I explained to the patient how the procedure is performed and the risks involved.    I informed patient that she will have a stent placed during the procedure and will remain in place after the procedure for a short time.  It will be removed in the office with a cystoscope, unless a string in left in place.  I informed that patient that about 50% of patients who undergo ureteroscopy and have a stent will have "stent pain," and this is by far the most common risk/complaint following ureteroscopy. A stent is a soft plastic tube (about half the size of IV tubing) that allows the kidney to drain to the bladder regardless of edema or obstruction. Not only can the stent "rub" on the inside of the bladder, causing a feeling of needing to urinate/overactive bladder, but also the stent allows urine to pass up from the bladder to the kidney during urination - causing symptoms from a warm, tingling sensation to intense pain in the affected flank.   They may be residual stones within the kidney or ureter may be present up to 40% of the time following ureteroscopy, depending on the original stone size and location. These stone fragments will be seen and addressed on  follow-up imaging.  Injury to the ureter is the most common intra-operative complication during ureteroscopy. The reported risk of perforation ranges greatly, depending on whether it is defined as a complete perforation (0.1-0.7% - think of this as a hole through the entire ureter), a partial perforation (1.6% - a hole nearly through the entire ureter), or mucosal tear/scrape (5% - these are similar to a sore on the inside of the mouth). Almost 100% of these will heal with prolonged stenting (anywhere between 2 - 4 weeks). Should a large perforation occur, your urologist may chose to stop the procedure and return on another day when the ureter has had time to heal.  Since she is experiencing left flank tenderness on exam, she will be scheduled for a cystoscopy with left URS/LL/L ureteral stent placement first with bilateral retrogrades to complete her hematuria work up as well.    - Urinalysis, Complete - CULTURE, URINE COMPREHENSIVE  2. Left adnexa mass:   [facility] evaluation and management.  3. Gross hematuria:   Patient will be undergoing surgical procedure to remove her stones in the future. A cystoscopy with retrogrades will be performed at that time as well.     Return for schedule surgery.  These notes generated with voice recognition software. I apologize for typographical errors.  Michiel Cowboy, PA-C  [facility] [address] [place], [place] [address] [phone]

## 2015-08-06 NOTE — Interval H&P Note (Signed)
History and Physical Interval Note:  08/06/2015 10:18 AM  [name]  has presented today for surgery, with the diagnosis of LEFT NEPHROLITHIASIS  The various methods of treatment have been discussed with the patient and family. After consideration of risks, benefits and other options for treatment, the patient has consented to  Procedure(s): CYSTOSCOPY/URETEROSCOPY/HOLMIUM LASER/STENT PLACEMENT (Left) CYSTOSCOPY WITH RETROGRADE PYELOGRAM (Bilateral) as a surgical intervention .  The patient's history has been reviewed, patient examined, no change in status, stable for surgery.  I have reviewed the patient's chart and labs.  Questions were answered to the patient's satisfaction.    RRR CTAB   [name]

## 2015-08-06 NOTE — Anesthesia Preprocedure Evaluation (Signed)
Anesthesia Evaluation  Patient identified by MRN, date of birth, ID band Patient awake    Reviewed: Allergy & Precautions, NPO status , Patient's Chart, lab work & pertinent test results  Airway Mallampati: II  TM Distance: >3 FB Neck ROM: Full    Dental  (+) Chipped   Pulmonary Current Smoker,    Pulmonary exam normal breath sounds clear to auscultation       Cardiovascular negative cardio ROS Normal cardiovascular exam     Neuro/Psych  Headaches, Anxiety    GI/Hepatic negative GI ROS, Neg liver ROS,   Endo/Other  negative endocrine ROS  Renal/GU stones  negative genitourinary   Musculoskeletal negative musculoskeletal ROS (+)   Abdominal Normal abdominal exam  (+)   Peds negative pediatric ROS (+)  Hematology negative hematology ROS (+)   Anesthesia Other Findings   Reproductive/Obstetrics negative OB ROS                             Anesthesia Physical Anesthesia Plan  ASA: II  Anesthesia Plan: General   Post-op Pain Management:    Induction: Intravenous  Airway Management Planned: Oral ETT  Additional Equipment:   Intra-op Plan:   Post-operative Plan: Extubation in OR  Informed Consent: I have reviewed the patients History and Physical, chart, labs and discussed the procedure including the risks, benefits and alternatives for the proposed anesthesia with the patient or authorized representative who has indicated his/her understanding and acceptance.   Dental advisory given  Plan Discussed with: CRNA and Surgeon  Anesthesia Plan Comments:         Anesthesia Quick Evaluation

## 2015-08-06 NOTE — Discharge Instructions (Signed)
You have a ureteral stent in place.  This is a tube that extends from your kidney to your bladder.  This may cause urinary bleeding, burning with urination, and urinary frequency.  Please call our office or present to the ED if you develop fevers >101 or pain which is not able to be controlled with oral pain medications.  You may be given either Flomax and/ or ditropan to help with bladder spasms and stent pain in addition to pain medications.  Your stent is on a string. This is taped to your right inner thigh. On Saturday, you may untaped the string and pulled gently to remove the entire stent. If you have any issues, please call our answering service over the weekend.    [facility] [address], [address] [place], [place] [address] [phone] AMBULATORY SURGERY  DISCHARGE INSTRUCTIONS   1) The drugs that you were given will stay in your system until tomorrow so for the next 24 hours you should not:  A) Drive an automobile B) Make any legal decisions C) Drink any alcoholic beverage   2) You may resume regular meals tomorrow.  Today it is better to start with liquids and gradually work up to solid foods.  You may eat anything you prefer, but it is better to start with liquids, then soup and crackers, and gradually work up to solid foods.   3) Please notify your doctor immediately if you have any unusual bleeding, trouble breathing, redness and pain at the surgery site, drainage, fever, or pain not relieved by medication.    4) Additional Instructions:        Please contact your physician with any problems or Same Day Surgery at [phone], Monday through Friday 6 am to 4 pm, or [place] at [facility] number at [phone].

## 2015-08-08 ENCOUNTER — Ambulatory Visit (INDEPENDENT_AMBULATORY_CARE_PROVIDER_SITE_OTHER): Payer: Self-pay | Admitting: Urology

## 2015-08-08 ENCOUNTER — Ambulatory Visit
Admission: RE | Admit: 2015-08-08 | Discharge: 2015-08-08 | Disposition: A | Discharge status: Home / Self Care | Payer: Self-pay | Source: Ambulatory Visit | Attending: Urology | Admitting: Urology

## 2015-08-08 ENCOUNTER — Telehealth: Payer: Self-pay

## 2015-08-08 ENCOUNTER — Encounter: Payer: Self-pay | Admitting: Urology

## 2015-08-08 ENCOUNTER — Other Ambulatory Visit: Payer: Self-pay | Admitting: Urology

## 2015-08-08 ENCOUNTER — Telehealth: Payer: Self-pay | Admitting: *Deleted

## 2015-08-08 VITALS — BP 120/76 | HR 81 | Ht 64.0 in | Wt 94.3 lb

## 2015-08-08 DIAGNOSIS — I82403 Acute embolism and thrombosis of unspecified deep veins of lower extremity, bilateral: Secondary | ICD-10-CM

## 2015-08-08 DIAGNOSIS — M79661 Pain in right lower leg: Secondary | ICD-10-CM

## 2015-08-08 DIAGNOSIS — R109 Unspecified abdominal pain: Secondary | ICD-10-CM

## 2015-08-08 DIAGNOSIS — N200 Calculus of kidney: Secondary | ICD-10-CM

## 2015-08-08 MED ORDER — KETOROLAC TROMETHAMINE 30 MG/ML IJ SOLN
30.0000 mg | Freq: Once | INTRAMUSCULAR | Status: AC
  Administered 2015-08-08: 30 mg via INTRAMUSCULAR

## 2015-08-08 MED ORDER — KETOROLAC TROMETHAMINE 30 MG/ML IM SOLN: 30.0000 mg | Freq: Once | INTRAMUSCULAR | Status: DC

## 2015-08-08 NOTE — Telephone Encounter (Signed)
Pt called in this morning c/o serious pain since surgery. Pt states that her pain in her back is increasingly getting worse. Pt states tramadol is now not touching the pain. Pt also states right leg and calf is sore. Pt states that she has been able to get some back pain relief from using a heating pad. Please advise.

## 2015-08-08 NOTE — Anesthesia Postprocedure Evaluation (Signed)
Anesthesia Post Note  Patient: Megan Bentley  Procedure(s) Performed: Procedure(s) (LRB): CYSTOSCOPY/URETEROSCOPY/HOLMIUM LASER/STENT PLACEMENT (Left) CYSTOSCOPY WITH RETROGRADE PYELOGRAM (Bilateral)  Patient location during evaluation: PACU Anesthesia Type: General Level of consciousness: awake and alert and oriented Pain management: pain level controlled Vital Signs Assessment: post-procedure vital signs reviewed and stable Respiratory status: spontaneous breathing Cardiovascular status: blood pressure returned to baseline Anesthetic complications: no    Last Vitals:  Filed Vitals:   08/06/15 1350 08/06/15 1415  BP: 103/65 99/59  Pulse: 67 70  Temp: 35.6 C   Resp: 14 16    Last Pain:  Filed Vitals:   08/08/15 0905  PainSc: 10-Worst pain ever                 Deaunna Olarte

## 2015-08-08 NOTE — Progress Notes (Signed)
IM Injection  Patient is present today and will be receiving an IM Injection for treatment of pain. Drug: Toradol Dose:30mg /78ml Location:Right Glut Lot: [id number] Exp:01/MAY/2018 Patient tolerated well, no complications were noted  Preformed by: Dallas Schimke CMA

## 2015-08-08 NOTE — Telephone Encounter (Signed)
Spoke with patient in regards to her wanting pain medication. Per Dr. Apolinar Junes patient to not receive any narcotics. (history) Patient states she did not ask for any pain medication at [place] she just wants something to help her sleep. Per Dr. Apolinar Junes patient may take 50 mg of Benadryl to help her sleep. Patient states she takes that everyday and it doesn't help her sleep, but whatever and hung up.

## 2015-08-08 NOTE — Progress Notes (Signed)
08/08/2015 11:36 AM   Megan Bentley 1985/08/09 [id number]   HPI: 30 year old female who returns to clinic today postop day 2 status post left ureteroscopy, laser lithotripsy, left ureteral stent placement. She complains today of right calf pain with flexion which she first noted yesterday morning. The pain has not gotten any better with ambulation. She described the pain as dull and throbbing in her right calf exacerbated by movement. She denies any left calf pain. No history of blood clots. Not currently on OCPs.  He also has significant left flank pain today. Prior to surgery, she expressed her desire not to take any narcotic medication given her history of opioid abuse. As such, she was on prescribed tramadol 50 mg 10 with a verbal contract not to renew this prescription.  Fevers or chills. No nausea or vomiting. No shortness of breath.  PMH: Past Medical History  Diagnosis Date  . Migraines   . Renal disorder   . Anxiety   . Kidney stone     Surgical History: Past Surgical History  Procedure Laterality Date  . None    . No past surgeries    . Cystoscopy/ureteroscopy/holmium laser/stent placement Left 08/06/2015    Procedure: CYSTOSCOPY/URETEROSCOPY/HOLMIUM LASER/STENT PLACEMENT;  Surgeon: [name], MD;  Location: [facility] ORS;  Service: Urology;  Laterality: Left;  . Cystoscopy w/ retrogrades Bilateral 08/06/2015    Procedure: CYSTOSCOPY WITH RETROGRADE PYELOGRAM;  Surgeon: [name], MD;  Location: [facility] ORS;  Service: Urology;  Laterality: Bilateral;    Home Medications:    Medication List       This list is accurate as of: 08/08/15 11:36 AM.  Always use your most recent med list.               ketorolac 30 MG/ML injection  Commonly known as:  TORADOL  Inject 1 mL (30 mg total) into the muscle once.     oxybutynin 5 MG tablet  Commonly known as:  DITROPAN  Take 1 tablet (5 mg total) by mouth every 8 (eight) hours as needed for bladder spasms.     phenazopyridine 200 MG tablet  Commonly known as:  PYRIDIUM  Take 1 tablet (200 mg total) by mouth 3 (three) times daily as needed for pain.     traMADol 50 MG tablet  Commonly known as:  ULTRAM  Take 1 tablet (50 mg total) by mouth every 6 (six) hours as needed for severe pain.        Allergies:  Allergies  Allergen Reactions  . Sulfa Antibiotics Anaphylaxis    Family History: Family History  Problem Relation Age of Onset  . Multiple sclerosis    . Sjogren's syndrome      Aunt kim  . Kidney disease Neg Hx   . Bladder Cancer Neg Hx   . Kidney Stones      Social History:  reports that she has been smoking Cigarettes.  She has a 5.5 pack-year smoking history. She has never used smokeless tobacco. She reports that she uses illicit drugs (Marijuana). She reports that she does not drink alcohol.  ROS: UROLOGY Frequent Urination?: No Hard to postpone urination?: No Burning/pain with urination?: Yes Get up at night to urinate?: Yes Leakage of urine?: No Urine stream starts and stops?: Yes Trouble starting stream?: No Do you have to strain to urinate?: No Blood in urine?: Yes Urinary tract infection?: No Sexually transmitted disease?: No Injury to kidneys or bladder?: No Painful intercourse?: No Weak stream?: No Currently pregnant?:  No Vaginal bleeding?: No Last menstrual period?: n  Gastrointestinal Nausea?: No Vomiting?: No Indigestion/heartburn?: No Diarrhea?: No Constipation?: No  Constitutional Fever: No Night sweats?: Yes Weight loss?: No Fatigue?: Yes  Skin Skin rash/lesions?: No Itching?: No  Eyes Blurred vision?: No Double vision?: No  Ears/Nose/Throat Sore throat?: Yes Sinus problems?: No  Hematologic/Lymphatic Swollen glands?: No Easy bruising?: No  Cardiovascular Leg swelling?: No Chest pain?: No  Respiratory Cough?: Yes Shortness of breath?: No  Endocrine Excessive thirst?: Yes  Musculoskeletal Back pain?: Yes Joint  pain?: Yes  Neurological Headaches?: Yes Dizziness?: Yes  Psychologic Depression?: No Anxiety?: No  Physical Exam: BP 120/76 mmHg  Pulse 81  Ht  (1.626 m)  Wt 94 lb 4.8 oz (42.774 kg)  BMI 16.18 kg/m2  LMP 07/16/2015  Constitutional:  Alert and oriented, No acute distress. HEENT: [place] AT, moist mucus membranes.  Trachea midline, no masses. Cardiovascular: No clubbing, cyanosis, or edema. Respiratory: Normal respiratory effort, no increased work of breathing. GI: Abdomen is soft, nontender, nondistended, no abdominal masses GU: Mild left CVA tenderness with deep palpation.  Skin: No rashes, bruises or suspicious lesions. Lymph: No cervical or inguinal adenopathy. Neurologic: Grossly intact, no focal deficits, moving all 4 extremities. Psychiatric: Normal mood and affect. MSK: Positive right Homans sign.  Laboratory Data: Lab Results  Component Value Date   WBC 10.8 07/15/2015   HGB 13.8 07/15/2015   HCT 40.7 07/15/2015   MCV 98.4 07/15/2015   PLT 241 07/15/2015    Lab Results  Component Value Date   CREATININE 0.61 07/15/2015    Pertinent Imaging: LE doppler reivewed  Assessment & Plan:   1. Right calf pain Clinical presentation with right calf pain, positive Homans sign in the postoperative state concerning for possible DVT. Stat right lower extremity Doppler obtained and is negative for any blood clot. Discuss results later in the day with the patient over the phone, right calf pain likely positional, should improve with time. - Ultrasound doppler venous legs bilat  2. Nephrolithiasis S/p L URS, nonobstructing R stones still presents Plan for self stent removal tomorrow as previously recommended F/u in 4 weeks with RUS prior  3. Left flank pain Fairly significant left postoperative stent pain. Given our preoperative discussion in preference to avoid narcotics, I am Toradol given in the office today for temporary relief. I stressed the importance of  maximizing nonnarcotic options including Tylenol and Motrin which she has not been taking. - ketorolac (TORADOL) 30 MG/ML injection; Inject 1 mL (30 mg total) into the muscle once.  Dispense: 1 mL; Refill: 0   F/u in 4 weeks as scheduled.   [name], MD  [facility] [address] [place], [place] [address] [phone]

## 2015-08-08 NOTE — Telephone Encounter (Signed)
Took a call report for Dr. Apolinar Junes for patient from [place] at [place]. Negative for DVT in both legs. Patient is requesting pain med. Will talk to Dr. Apolinar Junes and call patient back.

## 2015-08-12 ENCOUNTER — Telehealth: Payer: Self-pay

## 2015-08-12 LAB — STONE ANALYSIS
Ca Oxalate,Dihydrate: 15 %
Ca Oxalate,Monohydr.: 65 %
Ca phos cry stone ql IR: 20 %
STONE WEIGHT KSTONE: 21 mg

## 2015-08-12 NOTE — Telephone Encounter (Signed)
Pt called in stating that she pulled her stent out on Saturday as instructed by Dr. Apolinar Junes. Pt states that she has stopped bleeding Saturday night but this morning she started bleeding again. Per Dr. Apolinar Junes the bleeding is ok, drink plenty of water and monitor or fevers. Pt voiced understanding.

## 2015-08-14 ENCOUNTER — Telehealth: Payer: Self-pay | Admitting: Urology

## 2015-08-14 DIAGNOSIS — N83209 Unspecified ovarian cyst, unspecified side: Secondary | ICD-10-CM

## 2015-08-14 NOTE — Telephone Encounter (Signed)
Patient called the office again today.  She pulled her stent on Saturday.  She continues to experience bleeding ("dripping bright red blood, having to change a pad every couple hours").  She is concerned that this is not normal.  Also, she is requesting a referral to [facility] for a consult regarding the cyst on her ovaries.  Please advise.

## 2015-08-14 NOTE — Telephone Encounter (Signed)
Patient was having bleeding from the vagina on the day of surgery prior to the procedure.  Based on the nature of the bleeding, I do not feel that this is related to surgery or postoperative complications. She denies any issues voiding. She is no longer having flank pain.  I'll be happy to refer her to an OB/GYN for further workup along with evaluation of her ovarian cyst. Patient is agreeable with this plan.  She would like to go back to [place] where she delivered her baby. Referral place today.  Megan Scotland, MD

## 2015-08-18 MED ORDER — MIDAZOLAM HCL 2 MG/2ML IJ SOLN
INTRAMUSCULAR | Status: DC | PRN
  Administered 2015-08-06: 2 mg via INTRAVENOUS

## 2015-08-18 NOTE — Addendum Note (Signed)
Addendum  created 08/18/15 1437 by Henrietta HooverKimberly Charles Niese, CRNA   Modules edited: Anesthesia Medication Administration

## 2015-08-27 ENCOUNTER — Ambulatory Visit: Payer: MEDICAID

## 2015-09-02 ENCOUNTER — Ambulatory Visit: Payer: Self-pay | Admitting: Urology

## 2015-09-03 ENCOUNTER — Ambulatory Visit: Payer: Self-pay | Admitting: Urology

## 2015-09-26 ENCOUNTER — Emergency Department
Admission: EM | Admit: 2015-09-26 | Discharge: 2015-09-26 | Disposition: A | Discharge status: Home / Self Care | Payer: Self-pay | Attending: Emergency Medicine | Admitting: Emergency Medicine

## 2015-09-26 ENCOUNTER — Emergency Department: Payer: Self-pay

## 2015-09-26 ENCOUNTER — Encounter: Payer: Self-pay | Admitting: Emergency Medicine

## 2015-09-26 DIAGNOSIS — H6091 Unspecified otitis externa, right ear: Secondary | ICD-10-CM | POA: Insufficient documentation

## 2015-09-26 DIAGNOSIS — N200 Calculus of kidney: Secondary | ICD-10-CM | POA: Insufficient documentation

## 2015-09-26 DIAGNOSIS — R42 Dizziness and giddiness: Secondary | ICD-10-CM | POA: Insufficient documentation

## 2015-09-26 DIAGNOSIS — R310 Gross hematuria: Secondary | ICD-10-CM | POA: Insufficient documentation

## 2015-09-26 DIAGNOSIS — F17210 Nicotine dependence, cigarettes, uncomplicated: Secondary | ICD-10-CM | POA: Insufficient documentation

## 2015-09-26 DIAGNOSIS — F1299 Cannabis use, unspecified with unspecified cannabis-induced disorder: Secondary | ICD-10-CM | POA: Insufficient documentation

## 2015-09-26 LAB — BASIC METABOLIC PANEL
Anion gap: 6 (ref 5–15)
BUN: 10 mg/dL (ref 6–20)
CALCIUM: 9.6 mg/dL (ref 8.9–10.3)
CO2: 29 mmol/L (ref 22–32)
CREATININE: 0.7 mg/dL (ref 0.44–1.00)
Chloride: 103 mmol/L (ref 101–111)
GFR calc non Af Amer: >60 mL/min (ref >60)
GLUCOSE: 112 mg/dL — AB (ref 65–99)
Potassium: 3.6 mmol/L (ref 3.5–5.1)
Sodium: 138 mmol/L (ref 135–145)

## 2015-09-26 LAB — URINALYSIS COMPLETE WITH MICROSCOPIC (ARMC ONLY)
BILIRUBIN URINE: NEGATIVE
GLUCOSE, UA: NEGATIVE mg/dL
Hgb urine dipstick: NEGATIVE
KETONES UR: NEGATIVE mg/dL
Leukocytes, UA: NEGATIVE
Nitrite: NEGATIVE
Protein, ur: NEGATIVE mg/dL
SPECIFIC GRAVITY, URINE: 1.004 — AB (ref 1.005–1.030)
pH: 8 (ref 5.0–8.0)

## 2015-09-26 LAB — CBC
HCT: 39.6 % (ref 35.0–47.0)
Hemoglobin: 13.7 g/dL (ref 12.0–16.0)
MCH: 34.1 pg — AB (ref 26.0–34.0)
MCHC: 34.7 g/dL (ref 32.0–36.0)
MCV: 98.3 fL (ref 80.0–100.0)
PLATELETS: 227 10*3/uL (ref 150–440)
RBC: 4.03 MIL/uL (ref 3.80–5.20)
RDW: 12.9 % (ref 11.5–14.5)
WBC: 7.1 10*3/uL (ref 3.6–11.0)

## 2015-09-26 LAB — POCT PREGNANCY, URINE: Preg Test, Ur: NEGATIVE

## 2015-09-26 LAB — GLUCOSE, CAPILLARY: Glucose-Capillary: 111 mg/dL — ABNORMAL HIGH (ref 65–99)

## 2015-09-26 MED ORDER — DIPHENHYDRAMINE HCL 25 MG PO CAPS
25.0000 mg | ORAL_CAPSULE | Freq: Once | ORAL | Status: AC
  Administered 2015-09-26: 25 mg via ORAL
  Filled 2015-09-26: qty 1

## 2015-09-26 MED ORDER — NEOMYCIN-POLYMYXIN-HC 3.5-10000-1 OT SOLN: 3.0000 [drp] | Freq: Three times a day (TID) | OTIC | Status: DC

## 2015-09-26 MED ORDER — METOCLOPRAMIDE HCL 10 MG PO TABS
10.0000 mg | ORAL_TABLET | Freq: Once | ORAL | Status: AC
  Administered 2015-09-26: 10 mg via ORAL
  Filled 2015-09-26: qty 1

## 2015-09-26 NOTE — ED Notes (Signed)
Patient states that she has a knot that she found last night behind her right ear. Knot is painful and is causing pain in her ear that extends into her head. Patient states that she has a similar knot on her chest that she schedule for follow up for.   Patient also c/o feeling like she is going to pass out when she goes from sitting to standing if she does not bend her legs. Patient states that this is not a new complaint for her.

## 2015-09-26 NOTE — Discharge Instructions (Signed)
Ear Drops, Adult °You have been diagnosed with a condition requiring you to put drops of medicine into your outer ear. °HOME CARE INSTRUCTIONS  °· Put drops in the affected ear as instructed. After putting the drops in, you will need to lie down with the affected ear facing up for ten minutes so the drops will remain in the ear canal and run down and fill the canal. Continue using the ear drops for as long as directed by your health care provider. °· Prior to getting up, put a cotton ball gently in your ear canal. Leave enough of the cotton ball out so it can be easily removed. Do not attempt to push this down into the canal with a cotton-tipped swab or other instrument. °· Do not irrigate or wash out your ears if you have had a perforated eardrum or mastoid surgery, or unless instructed to do so by your health care provider. °· Keep appointments with your health care provider as instructed. °· Finish all medicine, or use for the length of time prescribed by your health care provider. Continue the drops even if your problem seems to be doing well after a couple days, or continue as instructed. °SEEK MEDICAL CARE IF: °· You become worse or develop increasing pain. °· You notice any unusual drainage from your ear (particularly if the drainage has a bad smell). °· You develop hearing difficulties. °· You experience a serious form of dizziness in which you feel as if the room is spinning, and you feel nauseated (vertigo). °· The outside of your ear becomes red or swollen or both. This may be a sign of an allergic reaction. °MAKE SURE YOU:  °· Understand these instructions. °· Will watch your condition. °· Will get help right away if you are not doing well or get worse. °  °This information is not intended to replace advice given to you by your health care provider. Make sure you discuss any questions you have with your health care provider. °  °Document Released: 05/25/2001 Document Revised: 06/21/2014 Document  Reviewed: 12/26/2012 °Elsevier Interactive Patient Education ©2016 [employer] ° °Otitis Externa °Otitis externa is a bacterial or fungal infection of the outer ear canal. This is the area from the eardrum to the outside of the ear. Otitis externa is sometimes called "swimmer's ear." °CAUSES  °Possible causes of infection include: °· Swimming in dirty water. °· Moisture remaining in the ear after swimming or bathing. °· Mild injury (trauma) to the ear. °· Objects stuck in the ear (foreign body). °· Cuts or scrapes (abrasions) on the outside of the ear. °SIGNS AND SYMPTOMS  °The first symptom of infection is often itching in the ear canal. Later signs and symptoms may include swelling and redness of the ear canal, ear pain, and yellowish-white fluid (pus) coming from the ear. The ear pain may be worse when pulling on the earlobe. °DIAGNOSIS  °Your health care provider will perform a physical exam. A sample of fluid may be taken from the ear and examined for bacteria or fungi. °TREATMENT  °Antibiotic ear drops are often given for 10 to 14 days. Treatment may also include pain medicine or corticosteroids to reduce itching and swelling. °HOME CARE INSTRUCTIONS  °· Apply antibiotic ear drops to the ear canal as prescribed by your health care provider. °· Take medicines only as directed by your health care provider. °· If you have diabetes, follow any additional treatment instructions from your health care provider. °· Keep all follow-up visits   as directed by your health care provider. °PREVENTION  °· Keep your ear dry. Use the corner of a towel to absorb water out of the ear canal after swimming or bathing. °· Avoid scratching or putting objects inside your ear. This can damage the ear canal or remove the protective wax that lines the canal. This makes it easier for bacteria and fungi to grow. °· Avoid swimming in lakes, polluted water, or poorly chlorinated pools. °· You may use ear drops made of rubbing alcohol and  vinegar after swimming. Combine equal parts of white vinegar and alcohol in a bottle. Put 3 or 4 drops into each ear after swimming. °SEEK MEDICAL CARE IF:  °· You have a fever. °· Your ear is still red, swollen, painful, or draining pus after 3 days. °· Your redness, swelling, or pain gets worse. °· You have a severe headache. °· You have redness, swelling, pain, or tenderness in the area behind your ear. °MAKE SURE YOU:  °· Understand these instructions. °· Will watch your condition. °· Will get help right away if you are not doing well or get worse. °  °This information is not intended to replace advice given to you by your health care provider. Make sure you discuss any questions you have with your health care provider. °  °Document Released: 05/31/2005 Document Revised: 06/21/2014 Document Reviewed: 06/17/2011 °Elsevier Interactive Patient Education ©2016 Elsevier Inc. ° °

## 2015-09-26 NOTE — ED Provider Notes (Addendum)
[facility] Emergency Department Provider Note  ____________________________________________  Time seen: 3:15 PM  I have reviewed the triage vital signs and the nursing notes.   HISTORY  Chief Complaint Headache and Dizziness    HPI Megan Bentley is a 30 y.o. female who complains of pain just inferior to the right earlobe that started 1-2 days ago. It's been constant since then. Feels like it extends into the head. Also reports having some blurry vision in the right eye today. No nausea vomiting or diarrhea. No neck pain or stiffness. No fevers or chills.  She also relates a multitude of chronic complaints including dizziness when she stands up quickly and a small hard prominence on the left upper anterior chest wall.     Past Medical History  Diagnosis Date  . Migraines   . Renal disorder   . Anxiety   . Kidney stone      Patient Active Problem List   Diagnosis Date Noted  . Kidney stones 07/19/2015  . Adnexal mass 07/19/2015  . Gross hematuria 07/19/2015     Past Surgical History  Procedure Laterality Date  . None    . No past surgeries    . Cystoscopy/ureteroscopy/holmium laser/stent placement Left 08/06/2015    Procedure: CYSTOSCOPY/URETEROSCOPY/HOLMIUM LASER/STENT PLACEMENT;  Surgeon: Vanna ScotlandAshley Brandon, MD;  Location: [facility] ORS;  Service: Urology;  Laterality: Left;  . Cystoscopy w/ retrogrades Bilateral 08/06/2015    Procedure: CYSTOSCOPY WITH RETROGRADE PYELOGRAM;  Surgeon: Vanna ScotlandAshley Brandon, MD;  Location: [facility] ORS;  Service: Urology;  Laterality: Bilateral;     Current Outpatient Rx  Name  Route  Sig  Dispense  Refill  . ketorolac (TORADOL) 30 MG/ML injection   Intramuscular   Inject 1 mL (30 mg total) into the muscle once.   1 mL   0   . oxybutynin (DITROPAN) 5 MG tablet   Oral   Take 1 tablet (5 mg total) by mouth every 8 (eight) hours as needed for bladder spasms.   30 tablet   0   . phenazopyridine (PYRIDIUM) 200 MG  tablet   Oral   Take 1 tablet (200 mg total) by mouth 3 (three) times daily as needed for pain. Patient not taking: Reported on 08/08/2015   10 tablet   0   . traMADol (ULTRAM) 50 MG tablet   Oral   Take 1 tablet (50 mg total) by mouth every 6 (six) hours as needed for severe pain.   10 tablet   0      Allergies Sulfa antibiotics   Family History  Problem Relation Age of Onset  . Multiple sclerosis    . Sjogren's syndrome      Aunt kim  . Kidney disease Neg Hx   . Bladder Cancer Neg Hx   . Kidney Stones      Social History Social History  Substance Use Topics  . Smoking status: Current Every Day Smoker -- 0.50 packs/day for 11 years    Types: Cigarettes  . Smokeless tobacco: Never Used  . Alcohol Use: No    Review of Systems  Constitutional:   No fever or chills.  Eyes: Blurry vision as above ENT:   No sore throat. No rhinorrhea. Cardiovascular:   No chest pain. Respiratory:   No dyspnea or cough. Gastrointestinal:   Negative for abdominal pain, vomiting and diarrhea.  No bloody stool. Genitourinary:   Negative for dysuria or difficulty urinating. Musculoskeletal:   Negative for focal pain or swelling Neurological:  Right-sided headache for the past 2 days 10-point ROS otherwise negative.  ____________________________________________   PHYSICAL EXAM:  VITAL SIGNS: ED Triage Vitals  Enc Vitals Group     BP 09/26/15 1033 150/85 mmHg     Pulse Rate 09/26/15 1033 122     Resp 09/26/15 1033 18     Temp 09/26/15 1033 98.7 F (37.1 C)     Temp Source 09/26/15 1033 Oral     SpO2 09/26/15 1033 100 %     Weight 09/26/15 1033 92 lb (41.731 kg)     Height 09/26/15 1033  (1.626 m)     Head Cir --      Peak Flow --      Pain Score 09/26/15 1033 8     Pain Loc --      Pain Edu? --      Excl. in GC? --     Vital signs reviewed, nursing assessments reviewed.   Constitutional:   Alert and oriented. Well appearing and in no distress. Eyes:   No scleral  icterus. No conjunctival pallor. PERRL. EOMI ENT   Head:   Normocephalic and atraumatic.Right external canal erythematous with increased cerumen production. Not impacted. TMs normal. Left external canal and TM normal. There is a small tender lymph node just inferior to the right auricle.   Nose:   No congestion/rhinnorhea. No septal hematoma   Mouth/Throat:   MMM, no pharyngeal erythema. No peritonsillar mass. No sublingual swelling. No evidence of gingivitis or purulent drainage.   Neck:   No stridor. No SubQ emphysema. No meningismus. Full range of motion, nontender Hematological/Lymphatic/Immunilogical:   No cervical lymphadenopathy. Cardiovascular:   RRR. Symmetric bilateral radial and DP pulses.  No murmurs.  Respiratory:   Normal respiratory effort without tachypnea nor retractions. Breath sounds are clear and equal bilaterally. No wheezes/rales/rhonchi. Gastrointestinal:   Soft and nontender. Non distended. There is no CVA tenderness.  No rebound, rigidity, or guarding. Genitourinary:   deferred Musculoskeletal:   Nontender with normal range of motion in all extremities. No joint effusions.  No lower extremity tenderness.  No edema. Left rib number to in the anterior chest wall has a palpable prominence that is asymmetric to the right. It is nontender. No crepitus or instability. Denies history of injury to the chest wall. Neurologic:   Normal speech and language.  CN 2-10 normal. Ambulatory with steady gait Motor grossly intact. No gross focal neurologic deficits are appreciated.  Skin:    Skin is warm, dry and intact. No rash noted.  No petechiae, purpura, or bullae.  ____________________________________________    LABS (pertinent positives/negatives) (all labs ordered are listed, but only abnormal results are displayed) Labs Reviewed  BASIC METABOLIC PANEL - Abnormal; Notable for the following:    Glucose, Bld 112 (*)    All other components within normal limits   CBC - Abnormal; Notable for the following:    MCH 34.1 (*)    All other components within normal limits  URINALYSIS COMPLETEWITH MICROSCOPIC ([facility] ONLY) - Abnormal; Notable for the following:    Color, Urine YELLOW (*)    APPearance CLEAR (*)    Specific Gravity, Urine 1.004 (*)    Bacteria, UA RARE (*)    Squamous Epithelial / LPF 0-5 (*)    All other components within normal limits  GLUCOSE, CAPILLARY - Abnormal; Notable for the following:    Glucose-Capillary 111 (*)    All other components within normal limits  CBG MONITORING, ED  POC  URINE PREG, ED  POCT PREGNANCY, URINE   ____________________________________________   EKG  Interpreted by me  Date: 09/26/2015  Rate: 89  Rhythm: normal sinus rhythm  QRS Axis: normal  Intervals: normal  ST/T Wave abnormalities: normal  Conduction Disutrbances: none  Narrative Interpretation: unremarkable      ____________________________________________    RADIOLOGY  MRI brain unremarkable  ____________________________________________   PROCEDURES   ____________________________________________   INITIAL IMPRESSION / ASSESSMENT AND PLAN / ED COURSE  Pertinent labs & imaging results that were available during my care of the patient were reviewed by me and considered in my medical decision making (see chart for details).  Patient presents with right ear pain with an associated lymph node formation and external canal inflammation consistent with otitis externa. This appears to be mild patient's nontoxic and nonseptic and well-appearing and suitable for otic antibiotics and follow-up. With the complaint of neurologic symptoms, we performed an MRI of the brain. I have low suspicion that she could have a stroke or intracranial hemorrhage and MRI was normal. She has a history of migraines and I think that very likely this is a complicated migraine is been precipitated by the otitis. Aside from the initial tachycardia noted in  triage, her heart rate is been 80-90 throughout her ED visit.     ____________________________________________   FINAL CLINICAL IMPRESSION(S) / ED DIAGNOSES  Final diagnoses:  Otitis externa, right       Portions of this note were generated with dragon dictation software. Dictation errors may occur despite best attempts at proofreading.   Sharman Cheek, MD 09/26/15 1708  Sharman Cheek, MD 09/26/15 [id number]

## 2015-09-26 NOTE — ED Notes (Addendum)
Patient presents to the ED with headache x 1 week and patient states she has noticed a small hard lump on the right side of her neck.  Patient states headache is more to the right side of her head.  Patient is complaining of some dizziness, states, "I feel like I'm going to faint."  Patient is drinking energy drink during triage.

## 2015-10-09 ENCOUNTER — Encounter: Payer: Self-pay | Admitting: Obstetrics and Gynecology

## 2016-04-28 ENCOUNTER — Encounter: Payer: Self-pay | Admitting: *Deleted

## 2016-04-28 ENCOUNTER — Emergency Department
Admission: EM | Admit: 2016-04-28 | Discharge: 2016-04-28 | Disposition: A | Discharge status: Home / Self Care | Payer: No Typology Code available for payment source | Attending: Emergency Medicine | Admitting: Emergency Medicine

## 2016-04-28 ENCOUNTER — Emergency Department: Payer: No Typology Code available for payment source

## 2016-04-28 DIAGNOSIS — S060X0A Concussion without loss of consciousness, initial encounter: Secondary | ICD-10-CM | POA: Diagnosis not present

## 2016-04-28 DIAGNOSIS — Y92410 Unspecified street and highway as the place of occurrence of the external cause: Secondary | ICD-10-CM | POA: Insufficient documentation

## 2016-04-28 DIAGNOSIS — Y9389 Activity, other specified: Secondary | ICD-10-CM | POA: Insufficient documentation

## 2016-04-28 DIAGNOSIS — S0990XA Unspecified injury of head, initial encounter: Secondary | ICD-10-CM | POA: Diagnosis present

## 2016-04-28 DIAGNOSIS — F17210 Nicotine dependence, cigarettes, uncomplicated: Secondary | ICD-10-CM | POA: Insufficient documentation

## 2016-04-28 DIAGNOSIS — S161XXA Strain of muscle, fascia and tendon at neck level, initial encounter: Secondary | ICD-10-CM | POA: Diagnosis not present

## 2016-04-28 DIAGNOSIS — Z791 Long term (current) use of non-steroidal anti-inflammatories (NSAID): Secondary | ICD-10-CM | POA: Diagnosis not present

## 2016-04-28 DIAGNOSIS — Y999 Unspecified external cause status: Secondary | ICD-10-CM | POA: Diagnosis not present

## 2016-04-28 MED ORDER — CYCLOBENZAPRINE HCL 10 MG PO TABS
ORAL_TABLET | ORAL | Status: AC
  Administered 2016-04-28: 10 mg via ORAL
  Filled 2016-04-28: qty 1

## 2016-04-28 MED ORDER — CYCLOBENZAPRINE HCL 10 MG PO TABS: 10.0000 mg | ORAL_TABLET | Freq: Three times a day (TID) | ORAL | 0 refills | Status: DC | PRN

## 2016-04-28 MED ORDER — NAPROXEN 500 MG PO TABS: 500.0000 mg | ORAL_TABLET | Freq: Two times a day (BID) | ORAL | 0 refills | Status: DC

## 2016-04-28 MED ORDER — CYCLOBENZAPRINE HCL 10 MG PO TABS
10.0000 mg | ORAL_TABLET | Freq: Once | ORAL | Status: AC
  Administered 2016-04-28: 10 mg via ORAL
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
## 2016-04-28 NOTE — Discharge Instructions (Signed)
Follow up with the primary care provider of your choice for symptoms that are not improving over the week °Return to the ER for symptoms that change or worsen if unable to schedule an appointment. °
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
## 2016-04-29 ENCOUNTER — Emergency Department: Payer: Medicaid Other

## 2016-04-29 ENCOUNTER — Emergency Department
Admission: EM | Admit: 2016-04-29 | Discharge: 2016-04-29 | Disposition: A | Discharge status: Home / Self Care | Payer: Medicaid Other | Attending: Emergency Medicine | Admitting: Emergency Medicine

## 2016-04-29 ENCOUNTER — Encounter: Payer: Self-pay | Admitting: Emergency Medicine

## 2016-04-29 DIAGNOSIS — N898 Other specified noninflammatory disorders of vagina: Secondary | ICD-10-CM | POA: Insufficient documentation

## 2016-04-29 DIAGNOSIS — Z791 Long term (current) use of non-steroidal anti-inflammatories (NSAID): Secondary | ICD-10-CM | POA: Insufficient documentation

## 2016-04-29 DIAGNOSIS — F17210 Nicotine dependence, cigarettes, uncomplicated: Secondary | ICD-10-CM | POA: Insufficient documentation

## 2016-04-29 DIAGNOSIS — R1032 Left lower quadrant pain: Secondary | ICD-10-CM | POA: Insufficient documentation

## 2016-04-29 DIAGNOSIS — R1030 Lower abdominal pain, unspecified: Secondary | ICD-10-CM

## 2016-04-29 DIAGNOSIS — Y9389 Activity, other specified: Secondary | ICD-10-CM | POA: Insufficient documentation

## 2016-04-29 DIAGNOSIS — Y999 Unspecified external cause status: Secondary | ICD-10-CM | POA: Diagnosis not present

## 2016-04-29 DIAGNOSIS — Y92410 Unspecified street and highway as the place of occurrence of the external cause: Secondary | ICD-10-CM | POA: Diagnosis not present

## 2016-04-29 DIAGNOSIS — S3991XA Unspecified injury of abdomen, initial encounter: Secondary | ICD-10-CM | POA: Diagnosis present

## 2016-04-29 LAB — URINALYSIS COMPLETE WITH MICROSCOPIC (ARMC ONLY)
Bacteria, UA: NONE SEEN
Bilirubin Urine: NEGATIVE
Glucose, UA: NEGATIVE mg/dL
HGB URINE DIPSTICK: NEGATIVE
LEUKOCYTES UA: NEGATIVE
NITRITE: NEGATIVE
PROTEIN: NEGATIVE mg/dL
SPECIFIC GRAVITY, URINE: 1.029 (ref 1.005–1.030)
pH: 5 (ref 5.0–8.0)

## 2016-04-29 LAB — COMPREHENSIVE METABOLIC PANEL
ALBUMIN: 4.5 g/dL (ref 3.5–5.0)
ALK PHOS: 38 U/L (ref 38–126)
ALT: 14 U/L (ref 14–54)
ANION GAP: 8 (ref 5–15)
AST: 20 U/L (ref 15–41)
BILIRUBIN TOTAL: 1.4 mg/dL — AB (ref 0.3–1.2)
BUN: 21 mg/dL — ABNORMAL HIGH (ref 6–20)
CALCIUM: 9.5 mg/dL (ref 8.9–10.3)
CO2: 24 mmol/L (ref 22–32)
Chloride: 106 mmol/L (ref 101–111)
Creatinine, Ser: 0.55 mg/dL (ref 0.44–1.00)
GLUCOSE: 112 mg/dL — AB (ref 65–99)
POTASSIUM: 4 mmol/L (ref 3.5–5.1)
Sodium: 138 mmol/L (ref 135–145)
TOTAL PROTEIN: 7 g/dL (ref 6.5–8.1)

## 2016-04-29 LAB — CBC
HEMATOCRIT: 39.8 % (ref 35.0–47.0)
HEMOGLOBIN: 13.8 g/dL (ref 12.0–16.0)
MCH: 33.9 pg (ref 26.0–34.0)
MCHC: 34.6 g/dL (ref 32.0–36.0)
MCV: 97.9 fL (ref 80.0–100.0)
Platelets: 258 10*3/uL (ref 150–440)
RBC: 4.07 MIL/uL (ref 3.80–5.20)
RDW: 13 % (ref 11.5–14.5)
WBC: 6.3 10*3/uL (ref 3.6–11.0)

## 2016-04-29 LAB — HCG, QUANTITATIVE, PREGNANCY: HCG, BETA CHAIN, QUANT, S: 1 m[IU]/mL (ref <5)

## 2016-04-29 LAB — WET PREP, GENITAL
CLUE CELLS WET PREP: NONE SEEN
Sperm: NONE SEEN
Trich, Wet Prep: NONE SEEN
Yeast Wet Prep HPF POC: NONE SEEN

## 2016-04-29 LAB — LIPASE, BLOOD: Lipase: 27 U/L (ref 11–51)

## 2016-04-29 LAB — CHLAMYDIA/NGC RT PCR (ARMC ONLY)
CHLAMYDIA TR: NOT DETECTED
N GONORRHOEAE: NOT DETECTED

## 2016-04-29 MED ORDER — SODIUM CHLORIDE 0.9 % IV BOLUS (SEPSIS)
1000.0000 mL | Freq: Once | INTRAVENOUS | Status: AC
  Administered 2016-04-29: 1000 mL via INTRAVENOUS

## 2016-04-29 MED ORDER — KETOROLAC TROMETHAMINE 30 MG/ML IJ SOLN
30.0000 mg | Freq: Once | INTRAMUSCULAR | Status: DC
  Filled 2016-04-29: qty 1

## 2016-04-29 MED ORDER — ONDANSETRON HCL 4 MG/2ML IJ SOLN
4.0000 mg | Freq: Once | INTRAMUSCULAR | Status: DC
  Filled 2016-04-29: qty 2

## 2016-04-29 NOTE — ED Notes (Signed)
Pt discharged home after verbalizing understanding of discharge instructions; nad noted.  Pt refused vitals; stayed on phone during discharge. Signed and left.

## 2016-04-29 NOTE — Discharge Instructions (Signed)

## 2016-04-29 NOTE — ED Triage Notes (Signed)
Pt to ED c/o abd pain today to LUQ, LLQ that is sharp intermittent spasm pain and worse with movement.  Denies n/v/d.  [facility] and [facility] and dx with concussion.  No bruising noted to abd.  Pt A&Ox4, speaking in complete and coherent sentences, chest rise even and unlabored.
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
## 2016-07-21 ENCOUNTER — Encounter: Payer: Self-pay | Admitting: Emergency Medicine

## 2016-07-21 ENCOUNTER — Emergency Department
Admission: EM | Admit: 2016-07-21 | Discharge: 2016-07-22 | Disposition: A | Discharge status: Home / Self Care | Payer: Medicaid Other | Attending: Emergency Medicine | Admitting: Emergency Medicine

## 2016-07-21 ENCOUNTER — Emergency Department: Payer: Medicaid Other

## 2016-07-21 DIAGNOSIS — F17210 Nicotine dependence, cigarettes, uncomplicated: Secondary | ICD-10-CM | POA: Diagnosis not present

## 2016-07-21 DIAGNOSIS — Z79899 Other long term (current) drug therapy: Secondary | ICD-10-CM | POA: Diagnosis not present

## 2016-07-21 DIAGNOSIS — R0789 Other chest pain: Secondary | ICD-10-CM | POA: Diagnosis present

## 2016-07-21 DIAGNOSIS — R079 Chest pain, unspecified: Secondary | ICD-10-CM

## 2016-07-21 DIAGNOSIS — R42 Dizziness and giddiness: Secondary | ICD-10-CM | POA: Insufficient documentation

## 2016-07-21 DIAGNOSIS — F1290 Cannabis use, unspecified, uncomplicated: Secondary | ICD-10-CM | POA: Insufficient documentation

## 2016-07-21 DIAGNOSIS — F419 Anxiety disorder, unspecified: Secondary | ICD-10-CM | POA: Insufficient documentation

## 2016-07-21 LAB — CBC
HEMATOCRIT: 35.5 % (ref 35.0–47.0)
Hemoglobin: 12.3 g/dL (ref 12.0–16.0)
MCH: 33 pg (ref 26.0–34.0)
MCHC: 34.7 g/dL (ref 32.0–36.0)
MCV: 95.1 fL (ref 80.0–100.0)
Platelets: 402 10*3/uL (ref 150–440)
RBC: 3.74 MIL/uL — AB (ref 3.80–5.20)
RDW: 13.6 % (ref 11.5–14.5)
WBC: 10 10*3/uL (ref 3.6–11.0)

## 2016-07-21 LAB — BASIC METABOLIC PANEL
Anion gap: 10 (ref 5–15)
BUN: 19 mg/dL (ref 6–20)
CHLORIDE: 103 mmol/L (ref 101–111)
CO2: 26 mmol/L (ref 22–32)
Calcium: 9.7 mg/dL (ref 8.9–10.3)
Creatinine, Ser: 0.83 mg/dL (ref 0.44–1.00)
GFR calc non Af Amer: >60 mL/min (ref >60)
Glucose, Bld: 91 mg/dL (ref 65–99)
POTASSIUM: 3.6 mmol/L (ref 3.5–5.1)
SODIUM: 139 mmol/L (ref 135–145)

## 2016-07-21 LAB — TROPONIN I: Troponin I: <0.03 ng/mL (ref <0.03)

## 2016-07-21 MED ORDER — LORAZEPAM 1 MG PO TABS
1.0000 mg | ORAL_TABLET | Freq: Once | ORAL | Status: AC
  Administered 2016-07-22: 1 mg via ORAL
  Filled 2016-07-21: qty 1

## 2016-07-21 NOTE — ED Triage Notes (Signed)
Pt comes into the ED via POV c/o chest tightness and dizziness that has been going on for over 1 month.  Patient states her whole chest feels tight and she has been unable to do her job appropriately lately.  Patient presents anxious in the triage room at this time.  Patient had a syncopal episode a couple of days ago and hasn't felt right since then.  Patient denies any N/V but states she always feels short of breath.  Denies any history of anxiety.

## 2016-07-21 NOTE — ED Provider Notes (Signed)
[facility] Emergency Department Provider Note   ____________________________________________   First MD Initiated Contact with Patient 07/21/16 2309     (approximate)  I have reviewed the triage vital signs and the nursing notes.   HISTORY  Chief Complaint Chest Pain and Dizziness    HPI Megan Bentley is a 31 y.o. female who presents to the ED from home with a chief complaint of chest pain and dizziness.Patient reports chest tightness and dizziness ongoing for over one month. States symptoms are worsened on exertion. 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 Describes chest tightness on exertion associated with dizziness and some shortness of breath. Denies associated diaphoresis, nausea/vomiting, palpitations. Father reports that the patient's grandfather is terminally ill and she has been caring for him and that has caused a lot of stress. Patient denies recent fever, chills, abdominal pain, dysuria, diarrhea. Denies recent travel or trauma. Denies OCP use. Does state she finished antibiotics for bronchitis over 1 week ago.   Past Medical History:  Diagnosis Date  . Anxiety   . Kidney stone   . Migraines   . Renal disorder     Patient Active Problem List   Diagnosis Date Noted  . Kidney stones 07/19/2015  . Adnexal mass 07/19/2015  . Gross hematuria 07/19/2015    Past Surgical History:  Procedure Laterality Date  . CYSTOSCOPY W/ RETROGRADES Bilateral 08/06/2015   Procedure: CYSTOSCOPY WITH RETROGRADE PYELOGRAM;  Surgeon: Vanna ScotlandAshley Brandon, MD;  Location: [facility] ORS;  Service: Urology;  Laterality: Bilateral;  . CYSTOSCOPY/URETEROSCOPY/HOLMIUM LASER/STENT PLACEMENT Left 08/06/2015   Procedure: CYSTOSCOPY/URETEROSCOPY/HOLMIUM LASER/STENT PLACEMENT;  Surgeon: Vanna ScotlandAshley Brandon, MD;  Location: [facility] ORS;  Service: Urology;  Laterality: Left;  . NO PAST SURGERIES    . none      Prior to Admission  medications   Medication Sig Start Date End Date Taking? Authorizing Provider  cyclobenzaprine (FLEXERIL) 10 MG tablet Take 1 tablet (10 mg total) by mouth 3 (three) times daily as needed for muscle spasms. 04/28/16   Chinita Pesterari B Triplett, FNP  ketorolac (TORADOL) 30 MG/ML injection Inject 1 mL (30 mg total) into the muscle once. 08/08/15   Vanna ScotlandAshley Brandon, MD  LORazepam (ATIVAN) 1 MG tablet Take 1 tablet (1 mg total) by mouth every 8 (eight) hours as needed for anxiety. 07/22/16   Irean HongJade J Sung, MD  naproxen (NAPROSYN) 500 MG tablet Take 1 tablet (500 mg total) by mouth 2 (two) times daily with a meal. 04/28/16   Cari B Triplett, FNP  neomycin-polymyxin-hydrocortisone (CORTISPORIN) otic solution Place 3 drops into both ears 3 (three) times daily. 09/26/15   Sharman CheekPhillip Stafford, MD  oxybutynin (DITROPAN) 5 MG tablet Take 1 tablet (5 mg total) by mouth every 8 (eight) hours as needed for bladder spasms. 08/06/15   Vanna ScotlandAshley Brandon, MD  phenazopyridine (PYRIDIUM) 200 MG tablet Take 1 tablet (200 mg total) by mouth 3 (three) times daily as needed for pain. Patient not taking: Reported on 08/08/2015 08/06/15   Vanna ScotlandAshley Brandon, MD  traMADol (ULTRAM) 50 MG tablet Take 1 tablet (50 mg total) by mouth every 6 (six) hours as needed for severe pain. 08/06/15   Vanna ScotlandAshley Brandon, MD    Allergies Sulfa antibiotics  Family History  Problem Relation Age of Onset  . Multiple sclerosis    . Sjogren's syndrome      Aunt kim  . Kidney Stones    . Kidney disease Neg Hx   .  Bladder Cancer Neg Hx   Father with anxiety and panic attacks.  Social History Social History  Substance Use Topics  . Smoking status: Current Every Day Smoker    Packs/day: 0.50    Years: 11.00    Types: Cigarettes  . Smokeless tobacco: Never Used  . Alcohol use No    Review of Systems  Constitutional: No fever/chills. Eyes: No visual changes. ENT: No sore throat. Cardiovascular: Positive for chest pain. Respiratory: Positive for shortness of  breath. Gastrointestinal: No abdominal pain.  No nausea, no vomiting.  No diarrhea.  No constipation. Genitourinary: Negative for dysuria. Musculoskeletal: Negative for back pain. Skin: Negative for rash. Neurological: Negative for headaches, focal weakness or numbness. Psychiatric:Positive for anxiety and stress.  10-point ROS otherwise negative.  ____________________________________________   PHYSICAL EXAM:  VITAL SIGNS: ED Triage Vitals  Enc Vitals Group     BP 07/21/16 1853 112/68     Pulse Rate 07/21/16 1853 (!) 112     Resp 07/21/16 1853 20     Temp 07/21/16 1853 98.2 F (36.8 C)     Temp Source 07/21/16 1853 Oral     SpO2 07/21/16 1853 100 %     Weight 07/21/16 1856 100 lb (45.4 kg)     Height 07/21/16 1856 5\' 4"  (1.626 m)     Head Circumference --      Peak Flow --      Pain Score 07/21/16 1856 5     Pain Loc --      Pain Edu? --      Excl. in GC? --     Constitutional: Alert and oriented. Well appearing and in no acute distress. Eyes: Conjunctivae are normal. PERRL. EOMI. Head: Atraumatic. Nose: No congestion/rhinnorhea. Mouth/Throat: Mucous membranes are moist.  Oropharynx non-erythematous. Neck: No stridor.  No thyromegaly. Cardiovascular: Normal rate, regular rhythm. Grossly normal heart sounds.  Good peripheral circulation. Respiratory: Normal respiratory effort.  No retractions. Lungs CTAB. Gastrointestinal: Soft and nontender. No distention. No abdominal bruits. No CVA tenderness. Musculoskeletal: No lower extremity tenderness nor edema.  No joint effusions. Neurologic:  Normal speech and language. No gross focal neurologic deficits are appreciated. No gait instability. Skin:  Skin is warm, dry and intact. No rash noted. Psychiatric: Mood and affect are slightly anxious. Speech and behavior are normal.  ____________________________________________   LABS (all labs ordered are listed, but only abnormal results are displayed)  Labs Reviewed  CBC -  Abnormal; Notable for the following:       Result Value   RBC 3.74 (*)    All other components within normal limits  BASIC METABOLIC PANEL  TROPONIN I  TSH  T4, FREE  FIBRIN DERIVATIVES D-DIMER ([facility] ONLY)   ____________________________________________  EKG  ED ECG REPORT I, SUNG,JADE J, the attending physician, personally viewed and interpreted this ECG.   Date: 07/21/2016  EKG Time: 1841  Rate: 111  Rhythm: sinus tachycardia  Axis: Normal  Intervals:none  ST&T Change: Nonspecific  ____________________________________________  RADIOLOGY  Chest 2 view interpreted per Dr. Sterling Big: No active cardiopulmonary disease. ____________________________________________   PROCEDURES  Procedure(s) performed: None  Procedures  Critical Care performed: No  ____________________________________________   INITIAL IMPRESSION / ASSESSMENT AND PLAN / ED COURSE  Pertinent labs & imaging results that were available during my care of the patient were reviewed by me and considered in my medical decision making (see chart for details).  31 year old female who presents with chest tightness and dizziness for over one month, worsened on  exertion. Initial EKG and troponin are unremarkable. Low suspicion for ACS, PE, dissection in this otherwise healthy female. Will check TSH, free T4 and d-dimer. Low-dose benzodiazepine administered for anxiety.  Clinical Course as of Jul 22 205  Thu Jul 22, 2016  0053 Updated patient of negative thyroid panel and d-dimer. Do not feel repeat troponin was warranted as patient's symptoms have been ongoing for over one month. Patient feeling better after Ativan, heart rate normalized. Mildly orthostatic without dizziness on standing. Will prescribe a limited quantity benzodiazepines and cardiology follow-up. Strict return precautions given. Patient and father verbalize understanding and agree with plan of care.  [JS]    Clinical Course User Index [JS] Irean Hong, MD     ____________________________________________   FINAL CLINICAL IMPRESSION(S) / ED DIAGNOSES  Final diagnoses:  Dizziness  Chest pain, unspecified type  Anxiety      NEW MEDICATIONS STARTED DURING THIS VISIT:  New Prescriptions   LORAZEPAM (ATIVAN) 1 MG TABLET    Take 1 tablet (1 mg total) by mouth every 8 (eight) hours as needed for anxiety.     Note:  This document was prepared using Dragon voice recognition software and may include unintentional dictation errors.    Irean Hong, MD 07/22/16 [phone]

## 2016-07-22 LAB — FIBRIN DERIVATIVES D-DIMER (ARMC ONLY): FIBRIN DERIVATIVES D-DIMER (ARMC): 220 (ref 0–499)

## 2016-07-22 LAB — TSH: TSH: 1.019 u[IU]/mL (ref 0.350–4.500)

## 2016-07-22 LAB — T4, FREE: FREE T4: 0.81 ng/dL (ref 0.61–1.12)

## 2016-07-22 MED ORDER — LORAZEPAM 1 MG PO TABS: 1.0000 mg | ORAL_TABLET | Freq: Three times a day (TID) | ORAL | 0 refills | Status: DC | PRN

## 2016-07-22 NOTE — Discharge Instructions (Signed)
1. You may take lorazepam as needed for stress/anxiety. 2. Return to the ER for worsening symptoms, persistent vomiting, difficulty breathing or other concerns.

## 2016-07-25 ENCOUNTER — Emergency Department
Admission: EM | Admit: 2016-07-25 | Discharge: 2016-07-25 | Disposition: A | Discharge status: Home / Self Care | Payer: Medicaid Other | Attending: Emergency Medicine | Admitting: Emergency Medicine

## 2016-07-25 DIAGNOSIS — F17210 Nicotine dependence, cigarettes, uncomplicated: Secondary | ICD-10-CM | POA: Insufficient documentation

## 2016-07-25 DIAGNOSIS — R0602 Shortness of breath: Secondary | ICD-10-CM | POA: Diagnosis not present

## 2016-07-25 DIAGNOSIS — R0789 Other chest pain: Secondary | ICD-10-CM | POA: Diagnosis present

## 2016-07-25 DIAGNOSIS — F1290 Cannabis use, unspecified, uncomplicated: Secondary | ICD-10-CM | POA: Insufficient documentation

## 2016-07-25 DIAGNOSIS — F410 Panic disorder [episodic paroxysmal anxiety] without agoraphobia: Secondary | ICD-10-CM | POA: Insufficient documentation

## 2016-07-25 DIAGNOSIS — R079 Chest pain, unspecified: Secondary | ICD-10-CM

## 2016-07-25 DIAGNOSIS — Z79899 Other long term (current) drug therapy: Secondary | ICD-10-CM | POA: Insufficient documentation

## 2016-07-25 LAB — TROPONIN I

## 2016-07-25 MED ORDER — DIAZEPAM 5 MG PO TABS
10.0000 mg | ORAL_TABLET | Freq: Once | ORAL | Status: AC
  Administered 2016-07-25: 10 mg via ORAL
  Filled 2016-07-25: qty 2

## 2016-07-25 NOTE — ED Notes (Signed)
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

## 2016-07-25 NOTE — ED Triage Notes (Signed)
Pt arrives via ems, pt is tearful, states that her chest feels like it is squeezing, states that she has felt this way since she was discharged, pt states that this evening when she stood up she felt dizzy
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
## 2016-07-25 NOTE — ED Notes (Signed)
Dr williams back to bedside.  

## 2016-07-25 NOTE — ED Provider Notes (Signed)
[facility] Emergency Department Provider Note        Time seen: ----------------------------------------- 8:23 PM on 07/25/2016 -----------------------------------------    I have reviewed the triage vital signs and the nursing notes.   HISTORY  Chief Complaint No chief complaint on file.    HPI Megan Bentley is a 31 y.o. female who presents to the ER forchest pain and shortness of breath. Patient states she was seen here recently and was told she was having panic attacks. Patient states her chest tightness Much worse tonight, she's only had 1 dose of Ativan today. Patient states her Ativan helped some but her symptoms persisted. She denies flu symptoms, denies fevers, cough, vomiting or diarrhea. Patient reports excessive stress at home due to a family member with the flu and her grandfather's recent heart attack.   Past Medical History:  Diagnosis Date  . Anxiety   . Kidney stone   . Migraines   . Renal disorder     Patient Active Problem List   Diagnosis Date Noted  . Kidney stones 07/19/2015  . Adnexal mass 07/19/2015  . Gross hematuria 07/19/2015    Past Surgical History:  Procedure Laterality Date  . CYSTOSCOPY W/ RETROGRADES Bilateral 08/06/2015   Procedure: CYSTOSCOPY WITH RETROGRADE PYELOGRAM;  Surgeon: [name], MD;  Location: [facility] ORS;  Service: Urology;  Laterality: Bilateral;  . CYSTOSCOPY/URETEROSCOPY/HOLMIUM LASER/STENT PLACEMENT Left 08/06/2015   Procedure: CYSTOSCOPY/URETEROSCOPY/HOLMIUM LASER/STENT PLACEMENT;  Surgeon: [name], MD;  Location: [facility] ORS;  Service: Urology;  Laterality: Left;  . NO PAST SURGERIES    . none      Allergies Sulfa antibiotics  Social History Social History  Substance Use Topics  . Smoking status: Current Every Day Smoker    Packs/day: 0.50    Years: 11.00    Types: Cigarettes  . Smokeless tobacco: Never Used  . Alcohol use No    Review of Systems Constitutional: Negative  for fever. Cardiovascular: Positive for chest pain Respiratory: Positive shortness of breath Gastrointestinal: Negative for abdominal pain, vomiting and diarrhea. Genitourinary: Negative for dysuria. Musculoskeletal: Negative for back pain. Skin: Negative for rash. Neurological: Negative for headaches, focal weakness or numbness. Psychiatric: Positive for stress and anxiety  10-point ROS otherwise negative.  ____________________________________________   PHYSICAL EXAM:  VITAL SIGNS: ED Triage Vitals  Enc Vitals Group     BP      Pulse      Resp      Temp      Temp src      SpO2      Weight      Height      Head Circumference      Peak Flow      Pain Score      Pain Loc      Pain Edu?      Excl. in GC?     Constitutional: Alert and oriented. Anxious, hyperventilating Eyes: Conjunctivae are normal. PERRL. Normal extraocular movements. ENT   Head: Normocephalic and atraumatic.   Nose: No congestion/rhinnorhea.   Mouth/Throat: Mucous membranes are moist.   Neck: No stridor. Cardiovascular: Normal rate, regular rhythm. No murmurs, rubs, or gallops. Respiratory: Tachypnea with clear breath sounds Gastrointestinal: Soft and nontender. Normal bowel sounds Musculoskeletal: Nontender with normal range of motion in all extremities. No lower extremity tenderness nor edema. Neurologic:  Normal speech and language. No gross focal neurologic deficits are appreciated.  Skin:  Skin is warm, dry and intact. No rash noted. Psychiatric: Mood and  affect are normal. Speech and behavior are normal.  ____________________________________________  EKG: Interpreted by me. Sinus tachycardia with a rate of 130 bpm, normal PR interval, normal QRS, normal QT.  ____________________________________________  ED COURSE:  Pertinent labs & imaging results that were available during my care of the patient were reviewed by me and considered in my medical decision making (see chart for  details). Patient presents to ER with symptoms of a panic attack. We will assess with basic labs, give oral Valium.   Procedures ____________________________________________   LABS (pertinent positives/negatives)  Labs Reviewed  TROPONIN I   ____________________________________________  FINAL ASSESSMENT AND PLAN  Chest pain, Panic attack  Plan: Patient with labs  as dictated above. Symptoms are clearly secondary to anxiety and stress. I will encourage oral benzodiazepine intake to alleviate the symptoms. She will be referred to psychiatry for outpatient follow-up.   Emily FilbertWilliams, Herma Uballe E, MD   Note: This note was generated in part or whole with voice recognition software. Voice recognition is usually quite accurate but there are transcription errors that can and very often do occur. I apologize for any typographical errors that were not detected and corrected.     Emily FilbertJonathan E Patty Lopezgarcia, MD 07/25/16 [phone]

## 2016-07-27 ENCOUNTER — Ambulatory Visit (INDEPENDENT_AMBULATORY_CARE_PROVIDER_SITE_OTHER): Payer: Medicaid Other | Admitting: Cardiology

## 2016-07-27 ENCOUNTER — Encounter: Payer: Self-pay | Admitting: Cardiology

## 2016-07-27 VITALS — BP 118/78 | HR 131 | Ht 62.0 in | Wt 89.8 lb

## 2016-07-27 DIAGNOSIS — R079 Chest pain, unspecified: Secondary | ICD-10-CM

## 2016-07-27 DIAGNOSIS — R000 Tachycardia, unspecified: Secondary | ICD-10-CM | POA: Diagnosis not present

## 2016-07-27 DIAGNOSIS — R002 Palpitations: Secondary | ICD-10-CM

## 2016-07-27 DIAGNOSIS — R0602 Shortness of breath: Secondary | ICD-10-CM | POA: Diagnosis not present

## 2016-07-27 NOTE — Progress Notes (Signed)
Cardiology Office Note   Date:  07/27/2016   ID:  Megan Bentley, DOB 21-May-1986, MRN [id number]  Referring Doctor:  [facility]   Cardiologist:   Almond LintAileen Korin Setzler, MD   Reason for consultation:  Chief Complaint  Patient presents with  . other    New patient. Hospital follow up for chest pain . Patient c/o Chest pain comes and goes,  SOB. Meds reviewed verbally with patient.       History of Present Illness: Megan Bentley is a 31 y.o. female who presents for Palpitations, chest pain, shortness of breath  Symptoms began in the last month or so. Sudden in onset, feels her heart fluttering. After this she notices sharp chest pain, moderate to severe intensity, lasting a few seconds at a time, nonradiating.  Related to shortness of breath. She cleans houses for a living and recently, with symptom onset, she feels significantly tired and is not able to finish 1 house without difficulty breathing.  Patient denies PND, orthopnea, edema. No passing out or loss of consciousness.  ROS:  Please see the history of present illness. Aside from mentioned under HPI, all other systems are reviewed and negative.     Past Medical History:  Diagnosis Date  . Anxiety   . Kidney stone   . Migraines   . Renal disorder     Past Surgical History:  Procedure Laterality Date  . CYSTOSCOPY W/ RETROGRADES Bilateral 08/06/2015   Procedure: CYSTOSCOPY WITH RETROGRADE PYELOGRAM;  Surgeon: Vanna ScotlandAshley Brandon, MD;  Location: [facility] ORS;  Service: Urology;  Laterality: Bilateral;  . CYSTOSCOPY/URETEROSCOPY/HOLMIUM LASER/STENT PLACEMENT Left 08/06/2015   Procedure: CYSTOSCOPY/URETEROSCOPY/HOLMIUM LASER/STENT PLACEMENT;  Surgeon: Vanna ScotlandAshley Brandon, MD;  Location: [facility] ORS;  Service: Urology;  Laterality: Left;  . NO PAST SURGERIES    . none       reports that she has been smoking Cigarettes.  She has a 5.50 pack-year smoking history. She has never used smokeless tobacco. She reports that she uses  drugs, including Marijuana. She reports that she does not drink alcohol.   family history is not on file.   Outpatient Medications Prior to Visit  Medication Sig Dispense Refill  . LORazepam (ATIVAN) 1 MG tablet Take 1 tablet (1 mg total) by mouth every 8 (eight) hours as needed for anxiety. 15 tablet 0  . cyclobenzaprine (FLEXERIL) 10 MG tablet Take 1 tablet (10 mg total) by mouth 3 (three) times daily as needed for muscle spasms. (Patient not taking: Reported on 07/27/2016) 30 tablet 0  . ketorolac (TORADOL) 30 MG/ML injection Inject 1 mL (30 mg total) into the muscle once. (Patient not taking: Reported on 07/27/2016) 1 mL 0  . naproxen (NAPROSYN) 500 MG tablet Take 1 tablet (500 mg total) by mouth 2 (two) times daily with a meal. (Patient not taking: Reported on 07/27/2016) 30 tablet 0  . neomycin-polymyxin-hydrocortisone (CORTISPORIN) otic solution Place 3 drops into both ears 3 (three) times daily. (Patient not taking: Reported on 07/27/2016) 10 mL 0  . oxybutynin (DITROPAN) 5 MG tablet Take 1 tablet (5 mg total) by mouth every 8 (eight) hours as needed for bladder spasms. (Patient not taking: Reported on 07/27/2016) 30 tablet 0  . phenazopyridine (PYRIDIUM) 200 MG tablet Take 1 tablet (200 mg total) by mouth 3 (three) times daily as needed for pain. (Patient not taking: Reported on 08/08/2015) 10 tablet 0  . traMADol (ULTRAM) 50 MG tablet Take 1 tablet (50 mg total) by mouth every 6 (six)  hours as needed for severe pain. (Patient not taking: Reported on 07/27/2016) 10 tablet 0   No facility-administered medications prior to visit.      Allergies: Sulfa antibiotics    PHYSICAL EXAM: VS:  BP 118/78 (BP Location: Left Arm, Patient Position: Sitting, Cuff Size: Normal)   Pulse (!) 131   Ht 5\' 2"  (1.575 m)   Wt 89 lb 12 oz (40.7 kg)   LMP 07/11/2016   BMI 16.42 kg/m  , Body mass index is 16.42 kg/m. Wt Readings from Last 3 Encounters:  07/27/16 89 lb 12 oz (40.7 kg)  07/25/16 100 lb (45.4  kg)  07/21/16 100 lb (45.4 kg)    GENERAL:  well developed, well nourished, Thin-appearing, not in acute distress HEENT: normocephalic, pink conjunctivae, anicteric sclerae, no xanthelasma, normal dentition, oropharynx clear NECK:  no neck vein engorgement, JVP normal, no hepatojugular reflux, carotid upstroke brisk and symmetric, no bruit, no thyromegaly, no lymphadenopathy LUNGS:  good respiratory effort, clear to auscultation bilaterally CV:  PMI not displaced, no thrills, no lifts, S1 and S2 within normal limits, no palpable S3 or S4, no murmurs, no rubs, no gallops ABD:  Soft, nontender, nondistended, normoactive bowel sounds, no abdominal aortic bruit, no hepatomegaly, no splenomegaly MS: nontender back, no kyphosis, no scoliosis, no joint deformities EXT:  2+ DP/PT pulses, no edema, no varicosities, no cyanosis, no clubbing SKIN: warm, nondiaphoretic, normal turgor, no ulcers NEUROPSYCH: alert, oriented to person, place, and time, sensory/motor grossly intact, normal mood, appropriate affect  Recent Labs: 04/29/2016: ALT 14 07/21/2016: BUN 19; Creatinine, Ser 0.83; Hemoglobin 12.3; Platelets 402; Potassium 3.6; Sodium 139; TSH 1.019   Lipid Panel No results found for: CHOL, TRIG, HDL, CHOLHDL, VLDL, LDLCALC, LDLDIRECT   Other studies Reviewed:  EKG:  The ekg from 07/27/2016 was personally reviewed by me and it revealed sinus tachycardia 141 BPM. Right atrial enlargement.  Additional studies/ records that were reviewed personally reviewed by me today include: None available   ASSESSMENT AND PLAN: Sinus tachycardia, on physical examination auscultation, heart rate was noted to be in the low 100s. Chest pain Shortness of breath Recommend rule out ischemia with stress echo, echocardiogram. Recommend 24-hour Holter monitor as well. Patient denies significant caffeine intake. She reports drinking enough water day. Recommend to avoid caffeine or energy drinks, drinking at least 8  glasses of water a day, proper nutrition and diet.   Current medicines are reviewed at length with the patient today.  The patient does not have concerns regarding medicines.  Labs/ tests ordered today include:  Orders Placed This Encounter  Procedures  . Holter monitor - 24 hour  . EKG 12-Lead  . ECHOCARDIOGRAM STRESS TEST  . ECHOCARDIOGRAM COMPLETE    I had a lengthy and detailed discussion with the patient regarding diagnoses, prognosis, diagnostic options. I counseled the patient on importance of lifestyle modification including heart healthy diet, regular physical activity Once cardiac workup is completed.   Disposition:   FU with undersigned after tests   Thank you for this consultation. We will forwarding this consultation to referring physician.   Signed, Almond Lint, MD  07/27/2016 4:11 PM    [facility]  This note was generated in part with voice recognition software and I apologize for any typographical errors that were not detected and corrected.

## 2016-07-27 NOTE — Patient Instructions (Addendum)
Testing/Procedures: Your physician has requested that you have an echocardiogram. Echocardiography is a painless test that uses sound waves to create images of your heart. It provides your doctor with information about the size and shape of your heart and how well your heart's chambers and valves are working. This procedure takes approximately one hour. There are no restrictions for this procedure.  Your physician has requested that you have a stress echocardiogram. For further information please visit [email or web address] Please follow instruction sheet as given.   Do not drink or eat foods with caffeine for 24 hours before the test. (Chocolate, coffee, tea, or energy drinks)  If you use an inhaler, bring it with you to the test.  Do not smoke for 4 hours before the test.  Wear comfortable shoes and clothing.  Your physician has recommended that you wear a holter monitor. Holter monitors are medical devices that record the heart's electrical activity. Doctors most often use these monitors to diagnose arrhythmias. Arrhythmias are problems with the speed or rhythm of the heartbeat. The monitor is a small, portable device. You can wear one while you do your normal daily activities. This is usually used to diagnose what is causing palpitations/syncope (passing out).    Follow-Up: Your physician recommends that you schedule a follow-up appointment as needed with Dr. Alvino Chapel. We will call you with results and if needed schedule follow up at that time.   It was a pleasure seeing you today here in the office. Please do not hesitate to give [place] a call back if you have any further questions. [phone]  Olivia Cellar RN, BSN    Echocardiogram An echocardiogram, or echocardiography, uses sound waves (ultrasound) to produce an image of your heart. The echocardiogram is simple, painless, obtained within a short period of time, and offers valuable information to your health care provider. The images from  an echocardiogram can provide information such as:  Evidence of coronary artery disease (CAD).  Heart size.  Heart muscle function.  Heart valve function.  Aneurysm detection.  Evidence of a past heart attack.  Fluid buildup around the heart.  Heart muscle thickening.  Assess heart valve function. Tell a health care provider about:  Any allergies you have.  All medicines you are taking, including vitamins, herbs, eye drops, creams, and over-the-counter medicines.  Any problems you or family members have had with anesthetic medicines.  Any blood disorders you have.  Any surgeries you have had.  Any medical conditions you have.  Whether you are pregnant or may be pregnant. What happens before the procedure? No special preparation is needed. Eat and drink normally. What happens during the procedure?  In order to produce an image of your heart, gel will be applied to your chest and a wand-like tool (transducer) will be moved over your chest. The gel will help transmit the sound waves from the transducer. The sound waves will harmlessly bounce off your heart to allow the heart images to be captured in real-time motion. These images will then be recorded.  You may need an IV to receive a medicine that improves the quality of the pictures. What happens after the procedure? You may return to your normal schedule including diet, activities, and medicines, unless your health care provider tells you otherwise. This information is not intended to replace advice given to you by your health care provider. Make sure you discuss any questions you have with your health care provider. Document Released: 05/28/2000 Document Revised: 01/17/2016 Document Reviewed: 02/05/2013 Elsevier  Interactive Patient Education  2017 Elsevier Inc.  Exercise Stress Echocardiogram An exercise stress echocardiogram is a test to check how well your heart is working. This test uses sound waves (ultrasound)  and a computer to make images of your heart before and after exercise. Ultrasound images that are taken before you exercise (your resting echocardiogram) will show how much blood is getting to your heart muscle and how well your heart muscle and heart valves are functioning. During the next part of this test, you will walk on a treadmill or ride a stationary bike to see how exercise affects your heart. While you exercise, the electrical activity of your heart will be monitored with an electrocardiogram (ECG). Your blood pressure will also be monitored. You may have this test if you:  Have chest pain or other symptoms of a heart problem.  Recently had a heart attack or heart surgery.  Have heart valve problems.  Have a condition that causes narrowing of the blood vessels that supply your heart (coronary artery disease).  Have a high risk of heart disease and are starting a new exercise program.  Have a high risk of heart disease and need to have major surgery. Tell a health care provider about:  Any allergies you have.  All medicines you are taking, including vitamins, herbs, eye drops, creams, and over-the-counter medicines.  Any problems you or family members have had with anesthetic medicines.  Any blood disorders you have.  Any surgeries you have had.  Any medical conditions you have.  Whether you are pregnant or may be pregnant. What are the risks? Generally, this is a safe procedure. However, problems may occur, including:  Chest pain.  Dizziness or light-headedness.  Shortness of breath.  Increased or irregular heartbeat (palpitations).  Nausea or vomiting.  Heart attack (very rare). What happens before the procedure?  Follow instructions from your health care provider about eating or drinking restrictions. You may be asked to avoid all forms of caffeine for 24 hours before your procedure, or as told by your health care provider.  Ask your health care provider  about changing or stopping your regular medicines. This is especially important if you are taking diabetes medicines or blood thinners.  If you use an inhaler, bring it with you to the test.  Wear loose, comfortable clothing and walking shoes.  Do notuse any products that contain nicotine or tobacco, such as cigarettes and e-cigarettes, for 4 hours before the test or as told by your health care provider. If you need help quitting, ask your health care provider. What happens during the procedure?  You will take off your clothes from the waist up and put on a hospital gown.  A technician will place electrodes on your chest.  A blood pressure cuff will be placed on your arm.  You will lie down on a table for an ultrasound exam before you exercise. Gel will be rubbed on your chest, and a handheld device (transducer) will be pressed against your chest and moved over your heart.  Then, you will start exercising by walking on a treadmill or pedaling a stationary bicycle.  Your blood pressure and heart rhythm will be monitored while you exercise.  The exercise will gradually get harder or faster.  You will exercise until:  Your heart reaches a target level.  You are too tired to continue.  You cannot continue because of chest pain, weakness, or dizziness.  You will have another ultrasound exam after you stop exercising.  The procedure may vary among health care providers and hospitals. What happens after the procedure?  Your heart rate and blood pressure will be monitored until they return to your normal levels. Summary  An exercise stress echocardiogram is a test that uses ultrasound to check how well your heart works before and after exercise.  Before the test, follow instructions from your health care provider about stopping medications, avoiding nicotine and tobacco, and avoiding certain foods and drinks.  During the test, your blood pressure and heart rhythm will be monitored  while you exercise on a treadmill or stationary bicycle. This information is not intended to replace advice given to you by your health care provider. Make sure you discuss any questions you have with your health care provider. Document Released: 06/04/2004 Document Revised: 01/21/2016 Document Reviewed: 01/21/2016 Elsevier Interactive Patient Education  2017 Elsevier Inc.  Holter Monitoring Introduction A Holter monitor is a small device that is used to detect abnormal heart rhythms. It clips to your clothing and is connected by wires to flat, sticky disks (electrodes) that attach to your chest. It is worn continuously for 24-48 hours. Follow these instructions at home:  Wear your Holter monitor at all times, even while exercising and sleeping, for as long as directed by your health care provider.  Make sure that the Holter monitor is safely clipped to your clothing or close to your body as recommended by your health care provider.  Do not get the monitor or wires wet.  Do not put body lotion or moisturizer on your chest.  Keep your skin clean.  Keep a diary of your daily activities, such as walking and doing chores. If you feel that your heartbeat is abnormal or that your heart is fluttering or skipping a beat:  Record what you are doing when it happens.  Record what time of day the symptoms occur.  Return your Holter monitor as directed by your health care provider.  Keep all follow-up visits as directed by your health care provider. This is important. Get help right away if:  You feel lightheaded or you faint.  You have trouble breathing.  You feel pain in your chest, upper arm, or jaw.  You feel sick to your stomach and your skin is pale, cool, or damp.  You heartbeat feels unusual or abnormal. This information is not intended to replace advice given to you by your health care provider. Make sure you discuss any questions you have with your health care  provider. Document Released: 02/27/2004 Document Revised: 11/06/2015 Document Reviewed: 01/07/2014  2017 Elsevier

## 2016-07-29 ENCOUNTER — Telehealth: Payer: Self-pay | Admitting: Cardiology

## 2016-07-29 NOTE — Telephone Encounter (Signed)
Pt calling to give us her family history  Mother side  Grandfather 1452 had a heart attack Uncle 42 heart attack and had Bypass and 62 he passed away Aunt had bypass at [id number] And two other aunt had pacemakers

## 2016-07-29 NOTE — Telephone Encounter (Signed)
Noted. I believe we have her set up for a stress echo.

## 2016-07-29 NOTE — Telephone Encounter (Signed)
Spoke with patient and let her know that I would pass this information on to Dr. Alvino ChapelIngal for her to review and that I would call her with results as they come in. She verbalized understanding and has no further questions at this time.

## 2016-08-05 ENCOUNTER — Ambulatory Visit (INDEPENDENT_AMBULATORY_CARE_PROVIDER_SITE_OTHER): Payer: Medicaid Other

## 2016-08-05 DIAGNOSIS — R0602 Shortness of breath: Secondary | ICD-10-CM

## 2016-08-05 DIAGNOSIS — R079 Chest pain, unspecified: Secondary | ICD-10-CM | POA: Diagnosis not present

## 2016-08-11 ENCOUNTER — Telehealth: Payer: Self-pay | Admitting: Cardiology

## 2016-08-11 NOTE — Telephone Encounter (Signed)
Spoke with patient and let her know that we just received the report and we are now waiting on Dr. Alvino ChapelIngal to interpret those results. Let her know that once we get those results we will call her. She verbalized understanding and had no further questions at this time.

## 2016-08-11 NOTE — Telephone Encounter (Signed)
Pt would like monitor results. Please call. 

## 2016-08-24 ENCOUNTER — Other Ambulatory Visit: Payer: Medicaid Other

## 2016-09-03 DIAGNOSIS — Z87891 Personal history of nicotine dependence: Secondary | ICD-10-CM | POA: Insufficient documentation

## 2016-09-23 ENCOUNTER — Ambulatory Visit (INDEPENDENT_AMBULATORY_CARE_PROVIDER_SITE_OTHER): Payer: Medicaid Other

## 2016-09-23 ENCOUNTER — Other Ambulatory Visit: Payer: Self-pay

## 2016-09-23 DIAGNOSIS — R079 Chest pain, unspecified: Secondary | ICD-10-CM

## 2016-09-23 DIAGNOSIS — R0602 Shortness of breath: Secondary | ICD-10-CM | POA: Diagnosis not present

## 2016-09-23 LAB — ECHOCARDIOGRAM STRESS TEST
CHL CUP MPHR: 192 {beats}/min
CHL CUP RESTING HR STRESS: 118 {beats}/min
CSEPED: 11 min
Estimated workload: 13.4 METS
Exercise duration (sec): 3 s
Peak HR: 206 {beats}/min
Percent HR: 107 %

## 2016-10-04 ENCOUNTER — Ambulatory Visit: Payer: Medicaid Other

## 2016-10-13 ENCOUNTER — Ambulatory Visit: Payer: Self-pay | Admitting: Urology

## 2016-10-13 ENCOUNTER — Encounter: Payer: Self-pay | Admitting: Urology

## 2017-02-17 ENCOUNTER — Ambulatory Visit
Admission: EM | Admit: 2017-02-17 | Discharge: 2017-02-17 | Disposition: A | Discharge status: Home / Self Care | Payer: Self-pay | Attending: Family Medicine | Admitting: Family Medicine

## 2017-02-17 DIAGNOSIS — R102 Pelvic and perineal pain: Secondary | ICD-10-CM

## 2017-02-17 DIAGNOSIS — N390 Urinary tract infection, site not specified: Secondary | ICD-10-CM

## 2017-02-17 DIAGNOSIS — Z87442 Personal history of urinary calculi: Secondary | ICD-10-CM

## 2017-02-17 LAB — URINALYSIS, COMPLETE (UACMP) WITH MICROSCOPIC
BILIRUBIN URINE: NEGATIVE
GLUCOSE, UA: NEGATIVE mg/dL
KETONES UR: NEGATIVE mg/dL
NITRITE: NEGATIVE
PH: 6 (ref 5.0–8.0)
Protein, ur: NEGATIVE mg/dL
Specific Gravity, Urine: 1.025 (ref 1.005–1.030)

## 2017-02-17 LAB — PREGNANCY, URINE: Preg Test, Ur: NEGATIVE

## 2017-02-17 MED ORDER — NITROFURANTOIN MONOHYD MACRO 100 MG PO CAPS: 100.0000 mg | ORAL_CAPSULE | Freq: Two times a day (BID) | ORAL | 0 refills | Status: DC

## 2017-02-17 MED ORDER — FLUCONAZOLE 150 MG PO TABS: 150.0000 mg | ORAL_TABLET | Freq: Once | ORAL | 0 refills | Status: AC

## 2017-02-17 MED ORDER — PHENAZOPYRIDINE HCL 200 MG PO TABS: 200.0000 mg | ORAL_TABLET | Freq: Three times a day (TID) | ORAL | 0 refills | Status: DC | PRN

## 2017-02-17 NOTE — ED Provider Notes (Signed)
[facility]    CSN: [id number] Arrival date & time: 02/17/17  1435     History   Chief Complaint Chief Complaint  Patient presents with  . Urinary Frequency    HPI Megan Bentley is a 31 y.o. female.   Patient is a 31 year old white female with a history of recurrent kidney stones. Not only has patient had recurrent kidney stones she reports symptoms of UTI. She reports increase incontinence burning when she urinates and burning when she does urinate increased frequency as well. She states his symptoms been going on for about 7-10 days. She also set history kidney stones and has several stones in the kidney and she is planning to see a urologist when she sees her PCP next week for referral. She has had surgery for her kidneys before and she's had UTIs before as well. He states he has retained stones but this does not feel like a kidney stone with the symptoms she is having. Patient is allergic to sulfa. She is a history of migraines anxiety renal disease. Only surgery has been cystoscopy. She does smoke.   The history is provided by the patient. No language interpreter was used.  Urinary Frequency  This is a new problem. The current episode started more than 1 week ago. The problem occurs constantly. The problem has been gradually worsening. Associated symptoms include abdominal pain. Pertinent negatives include no chest pain, no headaches and no shortness of breath. Nothing aggravates the symptoms. Nothing relieves the symptoms. She has tried nothing for the symptoms. The treatment provided no relief.    Past Medical History:  Diagnosis Date  . Anxiety   . Kidney stone   . Migraines   . Renal disorder     Patient Active Problem List   Diagnosis Date Noted  . Kidney stones 07/19/2015  . Adnexal mass 07/19/2015  . Gross hematuria 07/19/2015    Past Surgical History:  Procedure Laterality Date  . CYSTOSCOPY W/ RETROGRADES Bilateral 08/06/2015   Procedure:  CYSTOSCOPY WITH RETROGRADE PYELOGRAM;  Surgeon: [name], MD;  Location: [facility];  Service: Urology;  Laterality: Bilateral;  . CYSTOSCOPY/URETEROSCOPY/HOLMIUM LASER/STENT PLACEMENT Left 08/06/2015   Procedure: CYSTOSCOPY/URETEROSCOPY/HOLMIUM LASER/STENT PLACEMENT;  Surgeon: [name], MD;  Location: [facility];  Service: Urology;  Laterality: Left;  . NO PAST SURGERIES    . none      OB History    No data available       Home Medications    Prior to Admission medications   Medication Sig Start Date End Date Taking? Authorizing Provider  cetirizine (ZYRTEC) 10 MG tablet Take 10 mg by mouth daily.   Yes [provider]  cyclobenzaprine (FLEXERIL) 10 MG tablet Take 1 tablet (10 mg total) by mouth 3 (three) times daily as needed for muscle spasms. 04/28/16  Yes Triplett, Cari B, FNP  fluconazole (DIFLUCAN) 150 MG tablet Take 1 tablet (150 mg total) by mouth once. 02/17/17 02/17/17  Hassan Rowan, MD  ketorolac (TORADOL) 30 MG/ML injection Inject 1 mL (30 mg total) into the muscle once. Patient not taking: Reported on 07/27/2016 08/08/15   [name], MD  LORazepam (ATIVAN) 1 MG tablet Take 1 tablet (1 mg total) by mouth every 8 (eight) hours as needed for anxiety. 07/22/16   Irean Hong, MD  naproxen (NAPROSYN) 500 MG tablet Take 1 tablet (500 mg total) by mouth 2 (two) times daily with a meal. Patient not taking: Reported on 07/27/2016 04/28/16   Kem Boroughs  B, FNP  neomycin-polymyxin-hydrocortisone (CORTISPORIN) otic solution Place 3 drops into both ears 3 (three) times daily. Patient not taking: Reported on 07/27/2016 09/26/15   Sharman Cheek, MD  nitrofurantoin, macrocrystal-monohydrate, (MACROBID) 100 MG capsule Take 1 capsule (100 mg total) by mouth 2 (two) times daily. 02/17/17   Hassan Rowan, MD  oxybutynin (DITROPAN) 5 MG tablet Take 1 tablet (5 mg total) by mouth every 8 (eight) hours as needed for bladder spasms. Patient not taking: Reported on 07/27/2016 08/06/15    [name], MD  phenazopyridine (PYRIDIUM) 200 MG tablet Take 1 tablet (200 mg total) by mouth 3 (three) times daily as needed for pain. Patient not taking: Reported on 08/08/2015 08/06/15   [name], MD  phenazopyridine (PYRIDIUM) 200 MG tablet Take 1 tablet (200 mg total) by mouth 3 (three) times daily as needed for pain. 02/17/17   Hassan Rowan, MD  traMADol (ULTRAM) 50 MG tablet Take 1 tablet (50 mg total) by mouth every 6 (six) hours as needed for severe pain. Patient not taking: Reported on 07/27/2016 08/06/15   [name], MD    Family History Family History  Problem Relation Age of Onset  . Multiple sclerosis Unknown   . Sjogren's syndrome Unknown        Aunt kim  . Kidney Stones Unknown   . Kidney disease Neg Hx   . Bladder Cancer Neg Hx     Social History Social History  Substance Use Topics  . Smoking status: Current Every Day Smoker    Packs/day: 0.50    Years: 11.00    Types: Cigarettes  . Smokeless tobacco: Never Used  . Alcohol use No     Allergies   Sulfa antibiotics   Review of Systems Review of Systems  Respiratory: Negative for shortness of breath.   Cardiovascular: Negative for chest pain.  Gastrointestinal: Positive for abdominal pain.  Genitourinary: Positive for dysuria, frequency, pelvic pain and urgency. Negative for vaginal bleeding, vaginal discharge and vaginal pain.  Neurological: Negative for headaches.  All other systems reviewed and are negative.    Physical Exam Triage Vital Signs ED Triage Vitals  Enc Vitals Group     BP 02/17/17 1501 (!) 128/93     Pulse Rate 02/17/17 1501 (!) 109     Resp 02/17/17 1501 18     Temp 02/17/17 1501 98.6 F (37 C)     Temp Source 02/17/17 1501 Oral     SpO2 02/17/17 1501 98 %     Weight 02/17/17 1459 100 lb (45.4 kg)     Height 02/17/17 1459  (1.626 m)     Head Circumference --      Peak Flow --      Pain Score 02/17/17 1459 3     Pain Loc --      Pain Edu? --      Excl.  in GC? --    No data found.   Updated Vital Signs BP (!) 128/93 (BP Location: Left Arm)   Pulse (!) 109   Temp 98.6 F (37 C) (Oral)   Resp 18   Ht  (1.626 m)   Wt 100 lb (45.4 kg)   LMP 02/03/2017   SpO2 98%   BMI 17.16 kg/m   Visual Acuity Right Eye Distance:   Left Eye Distance:   Bilateral Distance:    Right Eye Near:   Left Eye Near:    Bilateral Near:     Physical Exam  Constitutional: She  is oriented to person, place, and time. She appears well-developed and well-nourished.  HENT:  Head: Normocephalic and atraumatic.  Right Ear: External ear normal.  Left Ear: External ear normal.  Mouth/Throat: Oropharynx is clear and moist.  Eyes: Pupils are equal, round, and reactive to light.  Neck: Normal range of motion. Neck supple.  Pulmonary/Chest: Effort normal.  Abdominal: Soft. Bowel sounds are normal. She exhibits no distension.  Musculoskeletal: Normal range of motion. She exhibits no edema or deformity.  Neurological: She is alert and oriented to person, place, and time.  Skin: Skin is warm. No rash noted. No erythema.  Psychiatric: She has a normal mood and affect.  Vitals reviewed.    UC Treatments / Results  Labs (all labs ordered are listed, but only abnormal results are displayed) Labs Reviewed  URINALYSIS, COMPLETE (UACMP) WITH MICROSCOPIC - Abnormal; Notable for the following:       Result Value   APPearance CLOUDY (*)    Hgb urine dipstick TRACE (*)    Leukocytes, UA SMALL (*)    Squamous Epithelial / LPF 0-5 (*)    Bacteria, UA FEW (*)    All other components within normal limits  URINE CULTURE  PREGNANCY, URINE    EKG  EKG Interpretation None       Radiology No results found.  Procedures Procedures (including critical care time)  Medications Ordered in UC Medications - No data to display  Results for orders placed or performed during the hospital encounter of 02/17/17  Urinalysis, Complete w Microscopic  Result Value  Ref Range   Color, Urine YELLOW YELLOW   APPearance CLOUDY (A) CLEAR   Specific Gravity, Urine 1.025 1.005 - 1.030   pH 6.0 5.0 - 8.0   Glucose, UA NEGATIVE NEGATIVE mg/dL   Hgb urine dipstick TRACE (A) NEGATIVE   Bilirubin Urine NEGATIVE NEGATIVE   Ketones, ur NEGATIVE NEGATIVE mg/dL   Protein, ur NEGATIVE NEGATIVE mg/dL   Nitrite NEGATIVE NEGATIVE   Leukocytes, UA SMALL (A) NEGATIVE   Squamous Epithelial / LPF 0-5 (A) NONE SEEN   WBC, UA TOO NUMEROUS TO COUNT 0 - 5 WBC/hpf   RBC / HPF 6-30 0 - 5 RBC/hpf   Bacteria, UA FEW (A) NONE SEEN  Pregnancy, urine  Result Value Ref Range   Preg Test, Ur NEGATIVE NEGATIVE   Initial Impression / Assessment and Plan / UC Course  I have reviewed the triage vital signs and the nursing notes.  Pertinent labs & imaging results that were available during my care of the patient were reviewed by me and considered in my medical decision making (see chart for details).   used F abnormal could possibly be a stone since she's states she's has stone attacks before and this feels more UTI we'll treat for the UTI. We will place her on Macrobid for the 7 Days Will Pl. her on Pyridium one tablet 2 times a day for 5 days and Diflucan use as needed. Will also get a urine culture as well.    Final Clinical Impressions(s) / UC Diagnoses   Final diagnoses:  Lower urinary tract infectious disease  History of kidney stones    New Prescriptions Discharge Medication List as of 02/17/2017  3:53 PM    START taking these medications   Details  fluconazole (DIFLUCAN) 150 MG tablet Take 1 tablet (150 mg total) by mouth once., Starting Thu 02/17/2017, Normal    nitrofurantoin, macrocrystal-monohydrate, (MACROBID) 100 MG capsule Take 1 capsule (100 mg total)  by mouth 2 (two) times daily., Starting Thu 02/17/2017, Normal    !! phenazopyridine (PYRIDIUM) 200 MG tablet Take 1 tablet (200 mg total) by mouth 3 (three) times daily as needed for pain., Starting Thu 02/17/2017,  Normal     !! - Potential duplicate medications found. Please discuss with provider.     Note: This dictation was prepared with Dragon dictation along with smaller phrase technology. Any transcriptional errors that result from this process are unintentional. Controlled Substance Prescriptions [place] Controlled Substance Registry consulted? Not Applicable   Hassan Rowan, MD 02/17/17 1626

## 2017-02-17 NOTE — ED Triage Notes (Signed)
Patient states that she has been having urinary urgency, frequency. Patient reports that she has a history of kidney stones and has recently passed stones over the last few days. Patient states that she has been having burning with urination as well and lower abdominal pain and flank pain.

## 2017-02-20 LAB — URINE CULTURE: SPECIAL REQUESTS: NORMAL

## 2017-03-02 ENCOUNTER — Emergency Department
Admission: EM | Admit: 2017-03-02 | Discharge: 2017-03-02 | Disposition: A | Discharge status: Home / Self Care | Payer: Medicaid Other | Attending: Emergency Medicine | Admitting: Emergency Medicine

## 2017-03-02 ENCOUNTER — Encounter: Payer: Self-pay | Admitting: Emergency Medicine

## 2017-03-02 DIAGNOSIS — F17210 Nicotine dependence, cigarettes, uncomplicated: Secondary | ICD-10-CM | POA: Diagnosis not present

## 2017-03-02 DIAGNOSIS — T887XXA Unspecified adverse effect of drug or medicament, initial encounter: Secondary | ICD-10-CM | POA: Insufficient documentation

## 2017-03-02 DIAGNOSIS — Y658 Other specified misadventures during surgical and medical care: Secondary | ICD-10-CM | POA: Insufficient documentation

## 2017-03-02 DIAGNOSIS — Z79899 Other long term (current) drug therapy: Secondary | ICD-10-CM | POA: Insufficient documentation

## 2017-03-02 DIAGNOSIS — R42 Dizziness and giddiness: Secondary | ICD-10-CM | POA: Diagnosis present

## 2017-03-02 DIAGNOSIS — N3000 Acute cystitis without hematuria: Secondary | ICD-10-CM

## 2017-03-02 DIAGNOSIS — R531 Weakness: Secondary | ICD-10-CM | POA: Diagnosis not present

## 2017-03-02 LAB — COMPREHENSIVE METABOLIC PANEL
ALT: 12 U/L — ABNORMAL LOW (ref 14–54)
AST: 17 U/L (ref 15–41)
Albumin: 4.3 g/dL (ref 3.5–5.0)
Alkaline Phosphatase: 42 U/L (ref 38–126)
Anion gap: 7 (ref 5–15)
BUN: 12 mg/dL (ref 6–20)
CHLORIDE: 101 mmol/L (ref 101–111)
CO2: 28 mmol/L (ref 22–32)
CREATININE: 0.89 mg/dL (ref 0.44–1.00)
Calcium: 9.7 mg/dL (ref 8.9–10.3)
GFR calc Af Amer: >60 mL/min (ref >60)
Glucose, Bld: 92 mg/dL (ref 65–99)
Potassium: 3.4 mmol/L — ABNORMAL LOW (ref 3.5–5.1)
Sodium: 136 mmol/L (ref 135–145)
Total Bilirubin: 0.9 mg/dL (ref 0.3–1.2)
Total Protein: 7.3 g/dL (ref 6.5–8.1)

## 2017-03-02 LAB — URINALYSIS, COMPLETE (UACMP) WITH MICROSCOPIC
Bilirubin Urine: NEGATIVE
GLUCOSE, UA: NEGATIVE mg/dL
Ketones, ur: NEGATIVE mg/dL
Leukocytes, UA: NEGATIVE
Nitrite: NEGATIVE
PROTEIN: NEGATIVE mg/dL
Specific Gravity, Urine: 1.027 (ref 1.005–1.030)
pH: 5 (ref 5.0–8.0)

## 2017-03-02 LAB — CBC
HEMATOCRIT: 38.4 % (ref 35.0–47.0)
Hemoglobin: 13.2 g/dL (ref 12.0–16.0)
MCH: 34.2 pg — AB (ref 26.0–34.0)
MCHC: 34.3 g/dL (ref 32.0–36.0)
MCV: 99.6 fL (ref 80.0–100.0)
PLATELETS: 279 10*3/uL (ref 150–440)
RBC: 3.86 MIL/uL (ref 3.80–5.20)
RDW: 13.5 % (ref 11.5–14.5)
WBC: 6.4 10*3/uL (ref 3.6–11.0)

## 2017-03-02 LAB — POCT PREGNANCY, URINE: Preg Test, Ur: NEGATIVE

## 2017-03-02 MED ORDER — CEPHALEXIN 500 MG PO CAPS: 500.0000 mg | ORAL_CAPSULE | Freq: Four times a day (QID) | ORAL | 0 refills | Status: AC

## 2017-03-02 NOTE — ED Provider Notes (Signed)
ED ECG REPORT I, Merrily Brittle, the attending physician, personally viewed and interpreted this ECG.  Date: 03/02/2017 EKG Time:  Rate: 80 Rhythm: normal sinus rhythm QRS Axis: normal Intervals: normal ST/T Wave abnormalities: normal Narrative Interpretation: no evidence of acute ischemia    Merrily Brittle, MD 03/02/17 1947

## 2017-03-02 NOTE — ED Triage Notes (Signed)
Patient presents to the ED with nausea that began today and "not feeling right".  Patient reports feeling weak and tired and yesterday she states she had a pre-syncopal episode.  Patient reports history of RA and Raynaud's disease.  Patient reports she was diagnosed with a UTI approx. 2 weeks ago and put on Macrobid, 2x day for 5 days.  Patient reports she still has approx. 4 pills left because she felt "horrible, like I wanted to die" if she took both macrobid tablets in one day so she slowed it down to 1 pill per day.  Patient also reports an occasional dry cough since last night.

## 2017-03-02 NOTE — ED Provider Notes (Signed)
[facility] Emergency Department Provider Note  ____________________________________________  Time seen: Approximately 7:25 PM  I have reviewed the triage vital signs and the nursing notes.   HISTORY  Chief Complaint Medication Reaction and Urinary Tract Infection    HPI Megan Bentley is a 31 y.o. female that presents to the emergency department for evaluation of dysuria, urgency, frequency and a couple episodes of dizziness and weakness at work today. When she was at work, she just didn't feel about three times. For several seconds  she felt weak and dizzy so she went home. Patient was diagnosed with urinary tract infection 10 days ago. The antibiotic, Macrobid, did not make her feel good so she did not finish taking them. She states that it feels like the infection never went away. She has had several kidney stones in the past and this does not at all feel the same. She is currently on her menstrual cycle. No discharge that is different than normal. She denies any recent illness. No headache, fever, shortness of breath, chest pain, nausea, vomiting, abdominal pain, back pain, diarrhea, constipation.   Past Medical History:  Diagnosis Date  . Anxiety   . Kidney stone   . Migraines   . Renal disorder     Patient Active Problem List   Diagnosis Date Noted  . Kidney stones 07/19/2015  . Adnexal mass 07/19/2015  . Gross hematuria 07/19/2015    Past Surgical History:  Procedure Laterality Date  . CYSTOSCOPY W/ RETROGRADES Bilateral 08/06/2015   Procedure: CYSTOSCOPY WITH RETROGRADE PYELOGRAM;  Surgeon: [name], MD;  Location: [facility] ORS;  Service: Urology;  Laterality: Bilateral;  . CYSTOSCOPY/URETEROSCOPY/HOLMIUM LASER/STENT PLACEMENT Left 08/06/2015   Procedure: CYSTOSCOPY/URETEROSCOPY/HOLMIUM LASER/STENT PLACEMENT;  Surgeon: [name], MD;  Location: [facility] ORS;  Service: Urology;  Laterality: Left;  . NO PAST SURGERIES    . none      Prior to  Admission medications   Medication Sig Start Date End Date Taking? Authorizing Provider  cephALEXin (KEFLEX) 500 MG capsule Take 1 capsule (500 mg total) by mouth 4 (four) times daily. 03/02/17 03/12/17  Enid Derry, PA-C  cetirizine (ZYRTEC) 10 MG tablet Take 10 mg by mouth daily.    [provider]  cyclobenzaprine (FLEXERIL) 10 MG tablet Take 1 tablet (10 mg total) by mouth 3 (three) times daily as needed for muscle spasms. 04/28/16   Triplett, Rulon Eisenmenger B, FNP  ketorolac (TORADOL) 30 MG/ML injection Inject 1 mL (30 mg total) into the muscle once. Patient not taking: Reported on 07/27/2016 08/08/15   [name], MD  LORazepam (ATIVAN) 1 MG tablet Take 1 tablet (1 mg total) by mouth every 8 (eight) hours as needed for anxiety. 07/22/16   Irean Hong, MD  naproxen (NAPROSYN) 500 MG tablet Take 1 tablet (500 mg total) by mouth 2 (two) times daily with a meal. Patient not taking: Reported on 07/27/2016 04/28/16   Kem Boroughs B, FNP  neomycin-polymyxin-hydrocortisone (CORTISPORIN) otic solution Place 3 drops into both ears 3 (three) times daily. Patient not taking: Reported on 07/27/2016 09/26/15   Sharman Cheek, MD  nitrofurantoin, macrocrystal-monohydrate, (MACROBID) 100 MG capsule Take 1 capsule (100 mg total) by mouth 2 (two) times daily. 02/17/17   Hassan Rowan, MD  oxybutynin (DITROPAN) 5 MG tablet Take 1 tablet (5 mg total) by mouth every 8 (eight) hours as needed for bladder spasms. Patient not taking: Reported on 07/27/2016 08/06/15   [name], MD  phenazopyridine (PYRIDIUM) 200 MG tablet Take 1 tablet (  200 mg total) by mouth 3 (three) times daily as needed for pain. Patient not taking: Reported on 08/08/2015 08/06/15   [name], MD  phenazopyridine (PYRIDIUM) 200 MG tablet Take 1 tablet (200 mg total) by mouth 3 (three) times daily as needed for pain. 02/17/17   Hassan Rowan, MD  traMADol (ULTRAM) 50 MG tablet Take 1 tablet (50 mg total) by mouth every 6 (six) hours as  needed for severe pain. Patient not taking: Reported on 07/27/2016 08/06/15   [name], MD    Allergies Sulfa antibiotics  Family History  Problem Relation Age of Onset  . Multiple sclerosis Unknown   . Sjogren's syndrome Unknown        Aunt kim  . Kidney Stones Unknown   . Kidney disease Neg Hx   . Bladder Cancer Neg Hx     Social History Social History  Substance Use Topics  . Smoking status: Current Every Day Smoker    Packs/day: 0.50    Years: 11.00    Types: Cigarettes  . Smokeless tobacco: Never Used  . Alcohol use No     Review of Systems  Constitutional: No fever/chills ENT: No upper respiratory complaints. Cardiovascular: No chest pain. Respiratory:  No SOB. Gastrointestinal: No abdominal pain.  No nausea, no vomiting.  Musculoskeletal: Negative for musculoskeletal pain. Skin: Negative for rash, abrasions, lacerations, ecchymosis. Neurological: Negative for headaches, numbness or tingling   ____________________________________________   PHYSICAL EXAM:  VITAL SIGNS: ED Triage Vitals  Enc Vitals Group     BP 03/02/17 1814 120/90     Pulse Rate 03/02/17 1814 87     Resp 03/02/17 1814 18     Temp 03/02/17 1814 98.6 F (37 C)     Temp Source 03/02/17 1814 Oral     SpO2 03/02/17 1814 100 %     Weight 03/02/17 1819 100 lb (45.4 kg)     Height 03/02/17 1819  (1.626 m)     Head Circumference --      Peak Flow --      Pain Score 03/02/17 1819 0     Pain Loc --      Pain Edu? --      Excl. in GC? --      Constitutional: Alert and oriented. Well appearing and in no acute distress. Eyes: Conjunctivae are normal. PERRL. EOMI. Head: Atraumatic. ENT:      Ears:      Nose: No congestion/rhinnorhea.      Mouth/Throat: Mucous membranes are moist.  Neck: No stridor.  Cardiovascular: Normal rate, regular rhythm.  Good peripheral circulation. Respiratory: Normal respiratory effort without tachypnea or retractions. Lungs CTAB. Good air entry to  the bases with no decreased or absent breath sounds. Gastrointestinal: Bowel sounds 4 quadrants. Soft and nontender to palpation. No guarding or rigidity. No palpable masses. No distention. No CVA tenderness. Musculoskeletal: Full range of motion to all extremities. No gross deformities appreciated. Neurologic:  Normal speech and language. No gross focal neurologic deficits are appreciated.  Skin:  Skin is warm, dry and intact. No rash noted.   ____________________________________________   LABS (all labs ordered are listed, but only abnormal results are displayed)  Labs Reviewed  CBC - Abnormal; Notable for the following:       Result Value   MCH 34.2 (*)    All other components within normal limits  COMPREHENSIVE METABOLIC PANEL - Abnormal; Notable for the following:    Potassium 3.4 (*)    ALT  12 (*)    All other components within normal limits  URINALYSIS, COMPLETE (UACMP) WITH MICROSCOPIC - Abnormal; Notable for the following:    Color, Urine AMBER (*)    APPearance CLEAR (*)    Hgb urine dipstick LARGE (*)    Bacteria, UA RARE (*)    Squamous Epithelial / LPF 0-5 (*)    All other components within normal limits  POCT PREGNANCY, URINE   ____________________________________________  EKG   ____________________________________________  RADIOLOGY   No results found.  ____________________________________________    PROCEDURES  Procedure(s) performed:    Procedures    Medications - No data to display   ____________________________________________   INITIAL IMPRESSION / ASSESSMENT AND PLAN / ED COURSE  Pertinent labs & imaging results that were available during my care of the patient were reviewed by me and considered in my medical decision making (see chart for details).  Review of the [place] CSRS was performed in accordance of the [facility] prior to dispensing any controlled drugs.   Patient presented to the emergency department for concerns that urinary  tract infection did not resolve. Vital signs, labwork, and exam are reassuring. Urinalysis shows blood but patient is on her menstrual cycle. Urinalysis shows leukocytes and rare bacteria. It will be sent for culture. No changes on EKG. Patient feels well while in the ED. Patient will be discharged home with prescriptions for Keflex. Patient is to follow up with PCP as directed. Patient is given ED precautions to return to the ED for any worsening or new symptoms.     ____________________________________________  FINAL CLINICAL IMPRESSION(S) / ED DIAGNOSES  Final diagnoses:  Acute cystitis without hematuria      NEW MEDICATIONS STARTED DURING THIS VISIT:  Discharge Medication List as of 03/02/2017  8:11 PM    START taking these medications   Details  cephALEXin (KEFLEX) 500 MG capsule Take 1 capsule (500 mg total) by mouth 4 (four) times daily., Starting Wed 03/02/2017, Until Sat 03/12/2017, Print            This chart was dictated using voice recognition software/Dragon. Despite best efforts to proofread, errors can occur which can change the meaning. Any change was purely unintentional.    Terrilynn, Postell, PA-C 03/02/17 2338    Jeanmarie Plant, MD 03/04/17 [phone]

## 2017-04-20 DIAGNOSIS — N200 Calculus of kidney: Secondary | ICD-10-CM | POA: Insufficient documentation

## 2018-01-30 ENCOUNTER — Emergency Department
Admission: EM | Admit: 2018-01-30 | Discharge: 2018-01-30 | Disposition: A | Discharge status: Home / Self Care | Payer: Medicaid Other | Attending: Emergency Medicine | Admitting: Emergency Medicine

## 2018-01-30 ENCOUNTER — Encounter: Payer: Self-pay | Admitting: Emergency Medicine

## 2018-01-30 DIAGNOSIS — F1210 Cannabis abuse, uncomplicated: Secondary | ICD-10-CM | POA: Insufficient documentation

## 2018-01-30 DIAGNOSIS — F17210 Nicotine dependence, cigarettes, uncomplicated: Secondary | ICD-10-CM | POA: Insufficient documentation

## 2018-01-30 DIAGNOSIS — R109 Unspecified abdominal pain: Secondary | ICD-10-CM | POA: Diagnosis not present

## 2018-01-30 DIAGNOSIS — R1032 Left lower quadrant pain: Secondary | ICD-10-CM | POA: Diagnosis not present

## 2018-01-30 HISTORY — DX: Localized scleroderma (morphea): K74.3

## 2018-01-30 HISTORY — DX: Other specified systemic involvement of connective tissue: M35.8

## 2018-01-30 HISTORY — DX: Unspecified osteoarthritis, unspecified site: M19.90

## 2018-01-30 HISTORY — DX: Localized scleroderma (morphea): L94.0

## 2018-01-30 LAB — URINALYSIS, COMPLETE (UACMP) WITH MICROSCOPIC
BACTERIA UA: NONE SEEN
Bilirubin Urine: NEGATIVE
GLUCOSE, UA: NEGATIVE mg/dL
Hgb urine dipstick: NEGATIVE
Ketones, ur: 5 mg/dL — AB
NITRITE: NEGATIVE
PROTEIN: NEGATIVE mg/dL
SPECIFIC GRAVITY, URINE: 1.019 (ref 1.005–1.030)
pH: 5 (ref 5.0–8.0)

## 2018-01-30 LAB — CBC
HCT: 42.9 % (ref 35.0–47.0)
HEMOGLOBIN: 14.8 g/dL (ref 12.0–16.0)
MCH: 34.3 pg — AB (ref 26.0–34.0)
MCHC: 34.6 g/dL (ref 32.0–36.0)
MCV: 99.2 fL (ref 80.0–100.0)
Platelets: 249 10*3/uL (ref 150–440)
RBC: 4.32 MIL/uL (ref 3.80–5.20)
RDW: 13 % (ref 11.5–14.5)
WBC: 7.4 10*3/uL (ref 3.6–11.0)

## 2018-01-30 LAB — COMPREHENSIVE METABOLIC PANEL
ALBUMIN: 4.5 g/dL (ref 3.5–5.0)
ALK PHOS: 45 U/L (ref 38–126)
ALT: 15 U/L (ref 0–44)
ANION GAP: 10 (ref 5–15)
AST: 19 U/L (ref 15–41)
BILIRUBIN TOTAL: 1.4 mg/dL — AB (ref 0.3–1.2)
BUN: 9 mg/dL (ref 6–20)
CALCIUM: 9.2 mg/dL (ref 8.9–10.3)
CO2: 22 mmol/L (ref 22–32)
Chloride: 105 mmol/L (ref 98–111)
Creatinine, Ser: 0.63 mg/dL (ref 0.44–1.00)
GFR calc Af Amer: >60 mL/min (ref >60)
GFR calc non Af Amer: >60 mL/min (ref >60)
GLUCOSE: 93 mg/dL (ref 70–99)
Potassium: 4.1 mmol/L (ref 3.5–5.1)
SODIUM: 137 mmol/L (ref 135–145)
TOTAL PROTEIN: 7.5 g/dL (ref 6.5–8.1)

## 2018-01-30 LAB — LIPASE, BLOOD: Lipase: 30 U/L (ref 11–51)

## 2018-01-30 LAB — POCT PREGNANCY, URINE: PREG TEST UR: NEGATIVE

## 2018-01-30 MED ORDER — TRAMADOL HCL 50 MG PO TABS: 50.0000 mg | ORAL_TABLET | Freq: Four times a day (QID) | ORAL | 0 refills | Status: AC | PRN

## 2018-01-30 MED ORDER — ALPRAZOLAM 0.5 MG PO TABS
0.5000 mg | ORAL_TABLET | Freq: Once | ORAL | Status: DC
Start: 2018-01-30 — End: 2018-01-30

## 2018-01-30 NOTE — ED Triage Notes (Signed)
Patient presents to the ED with left lower quadrant abdominal pain and left flank pain.  Patient states pain started this am and is constant.  Patient reports history of kidney stones.  Patient denies dysuria.

## 2018-01-30 NOTE — ED Notes (Signed)
While assessment in room, pt declined pelvic and further workup offered by Alphonzo LemmingsMcshane, MD.

## 2018-01-30 NOTE — Discharge Instructions (Signed)
It is not exactly clear why you have this abdominal pain today.  It certainly could be because of a kidney stone although you do not have blood in your urine.  For this reason we did offer your pelvic exam, ultrasound and possibly CT all of which would prefer not to have.  Thus not unreasonable but as we discussed extensively does limit our ability to evaluate this discomfort.  For this reason we asked you to continue at home being very vigilant about your health.  If you have severe abdominal pain, vomiting, fever, change your mind about further work-up or you have any other concerns please return to the emergency department.  Do not drive on pain medications.  We are also sending you with a strainer if one is available.  Finally, we are sending urine culture, if it comes positive for urine infection we will call you.

## 2018-01-30 NOTE — ED Provider Notes (Signed)
[facility] Emergency Department Provider Note  ____________________________________________   I have reviewed the triage vital signs and the nursing notes. Where available I have reviewed prior notes and, if possible and indicated, outside hospital notes.    HISTORY  Chief Complaint Abdominal Pain    HPI Megan Bentley is a 32 y.o. female of kidney stones anxiety, migraines, Raynaud's disease, and multiple different negative CT scans for abdominal pain in the past, multiple different ultrasounds in the past, presents today with what she believes to be a kidney stone.  Gradual onset pain in the left lower quadrant.  No fever no chills no vomiting, no diarrhea no vaginal discharge no STI symptoms.  Patient states it feels exactly like prior kidney stones.  Is mild in severity at this time.  She states she does not want further work-up she wanted to make sure that her blood work looked okay.  Patient states that she is not pregnant, and she states that she has had no dysuria no urinary symptoms and no systemic illness such as vomiting.  This is a chronic recurrent pain for her.  Patient has been to multiple different emergency room for this in the past she states.  Records do verify this.   Past Medical History:  Diagnosis Date  . Anxiety   . Arthritis   . Kidney stone   . Migraines   . Renal disorder   . Reynolds syndrome [facility])     Patient Active Problem List   Diagnosis Date Noted  . Kidney stones 07/19/2015  . Adnexal mass 07/19/2015  . Gross hematuria 07/19/2015    Past Surgical History:  Procedure Laterality Date  . CYSTOSCOPY W/ RETROGRADES Bilateral 08/06/2015   Procedure: CYSTOSCOPY WITH RETROGRADE PYELOGRAM;  Surgeon: [name], MD;  Location: [facility] ORS;  Service: Urology;  Laterality: Bilateral;  . CYSTOSCOPY/URETEROSCOPY/HOLMIUM LASER/STENT PLACEMENT Left 08/06/2015   Procedure: CYSTOSCOPY/URETEROSCOPY/HOLMIUM LASER/STENT PLACEMENT;  Surgeon:  [name], MD;  Location: [facility] ORS;  Service: Urology;  Laterality: Left;  . NO PAST SURGERIES    . none      Prior to Admission medications   Not on File    Allergies Sulfa antibiotics  Family History  Problem Relation Age of Onset  . Multiple sclerosis Unknown   . Sjogren's syndrome Unknown        Aunt kim  . Kidney Stones Unknown   . Kidney disease Neg Hx   . Bladder Cancer Neg Hx     Social History Social History   Tobacco Use  . Smoking status: Current Every Day Smoker    Packs/day: 0.30    Years: 11.00    Pack years: 3.30    Types: Cigarettes  . Smokeless tobacco: Never Used  Substance Use Topics  . Alcohol use: No    Alcohol/week: 0.0 standard drinks  . Drug use: Yes    Types: Marijuana    Review of Systems Constitutional: No fever/chills Eyes: No visual changes. ENT: No sore throat. No stiff neck no neck pain Cardiovascular: Denies chest pain. Respiratory: Denies shortness of breath. Gastrointestinal:   no vomiting.  No diarrhea.  No constipation. Genitourinary: Negative for dysuria. Musculoskeletal: Negative lower extremity swelling Skin: Negative for rash. Neurological: Negative for severe headaches, focal weakness or numbness.   ____________________________________________   PHYSICAL EXAM:  VITAL SIGNS: ED Triage Vitals  Enc Vitals Group     BP 01/30/18 1808 (!) 139/102     Pulse Rate 01/30/18 1808 82  Resp 01/30/18 1808 16     Temp 01/30/18 1808 98.8 F (37.1 C)     Temp Source 01/30/18 1808 Oral     SpO2 01/30/18 1808 99 %     Weight 01/30/18 1809 100 lb (45.4 kg)     Height 01/30/18 1809 5\' 4"  (1.626 m)     Head Circumference --      Peak Flow --      Pain Score 01/30/18 1808 8     Pain Loc --      Pain Edu? --      Excl. in GC? --     Constitutional: Alert and oriented. Well appearing and in no acute distress. Eyes: Conjunctivae are normal Head: Atraumatic HEENT: No congestion/rhinnorhea. Mucous membranes are  moist.  Oropharynx non-erythematous Neck:   Nontender with no meningismus, no masses, no stridor Cardiovascular: Normal rate, regular rhythm. Grossly normal heart sounds.  Good peripheral circulation. Respiratory: Normal respiratory effort.  No retractions. Lungs CTAB. Abdominal: Soft and light left-sided discomfort in the lower mid abdomen, no suprapubic tenderness. No distention. No guarding no rebound Back:  There is no focal tenderness or step off.  there is no midline tenderness there are no lesions noted. there is no CVA tenderness GU: Patient declines Musculoskeletal: No lower extremity tenderness, no upper extremity tenderness. No joint effusions, no DVT signs strong distal pulses no edema Neurologic:  Normal speech and language. No gross focal neurologic deficits are appreciated.  Skin:  Skin is warm, dry and intact. No rash noted. Psychiatric: Mood and affect are normal. Speech and behavior are normal.  ____________________________________________   LABS (all labs ordered are listed, but only abnormal results are displayed)  Labs Reviewed  COMPREHENSIVE METABOLIC PANEL - Abnormal; Notable for the following components:      Result Value   Total Bilirubin 1.4 (*)    All other components within normal limits  CBC - Abnormal; Notable for the following components:   MCH 34.3 (*)    All other components within normal limits  URINALYSIS, COMPLETE (UACMP) WITH MICROSCOPIC - Abnormal; Notable for the following components:   Color, Urine YELLOW (*)    APPearance CLEAR (*)    Ketones, ur 5 (*)    Leukocytes, UA TRACE (*)    All other components within normal limits  URINE CULTURE  LIPASE, BLOOD  POC URINE PREG, ED  POCT PREGNANCY, URINE    Pertinent labs  results that were available during my care of the patient were reviewed by me and considered in my medical decision making (see chart for details). ____________________________________________  EKG  I personally interpreted  any EKGs ordered by me or triage  ____________________________________________  RADIOLOGY  Pertinent labs & imaging results that were available during my care of the patient were reviewed by me and considered in my medical decision making (see chart for details). If possible, patient and/or family made aware of any abnormal findings.  No results found. ____________________________________________    PROCEDURES  Procedure(s) performed: None  Procedures  Critical Care performed: None  ____________________________________________   INITIAL IMPRESSION / ASSESSMENT AND PLAN / ED COURSE  Pertinent labs & imaging results that were available during my care of the patient were reviewed by me and considered in my medical decision making (see chart for details).  She with a nonsurgical abdomen presents today with abdominal discomfort which she states is similar to prior kidney stones.  Urinalysis does not show any evidence of blood.  Questionable possible early UTI  is indicated but we will send a culture, certainly no indication of a infected stone.  Blood work is otherwise completely reassuring.  I have offered her a pelvic exam but she declines.  I offered her an ultrasound of her ovaries and explained to her the risk benefits alternative of not having that including missed torsion, which I do not think is likely but still is in the consideration, and she declines.  I have offered her CT scan and she declines.  She states she will follow-up with her urologist.  She would like a few pain pills in case the pain gets worse at night, and she will follow closely with Dr. Apolinar JunesBrandon whom she normally follows with.  Given that she refuses pelvic exam, imaging, and any further work-up here, she is asymptomatic essentially at this time with a slight discomfort, she would like to go to a restaurant and eat food and she has no significant surgical symptoms, I certainly cannot argue with her proposed plan  although I have explained to her extensively the limitations and explained to her that I would document the limitations of the work-up as she prefers it to be.  Patient is very comfortable with this.  States if she changes her mind she can come back at any time.    ____________________________________________   FINAL CLINICAL IMPRESSION(S) / ED DIAGNOSES  Final diagnoses:  None      This chart was dictated using voice recognition software.  Despite best efforts to proofread,  errors can occur which can change meaning.      Jeanmarie PlantMcShane, Ellianna Ruest A, MD 01/30/18 2105

## 2018-01-30 NOTE — ED Notes (Signed)
Pt presents after awakening with sharp pains in her lower back this morning. She then began to hurt in her llq after eating breakfast. She reports that she has hx of kidney stones and that the pain has continued; leading her to believe she may have stones again. Denies hematuria, dysuria. Endorses nausea. Pt alert & oriented with NAD noted.

## 2018-02-01 LAB — URINE CULTURE

## 2018-11-13 DIAGNOSIS — I7300 Raynaud's syndrome without gangrene: Secondary | ICD-10-CM | POA: Insufficient documentation

## 2018-11-13 DIAGNOSIS — M419 Scoliosis, unspecified: Secondary | ICD-10-CM | POA: Insufficient documentation

## 2018-12-07 ENCOUNTER — Encounter: Payer: Self-pay | Admitting: Emergency Medicine

## 2018-12-07 ENCOUNTER — Emergency Department
Admission: EM | Admit: 2018-12-07 | Discharge: 2018-12-07 | Disposition: A | Discharge status: Home / Self Care | Payer: Medicaid Other | Attending: Emergency Medicine | Admitting: Emergency Medicine

## 2018-12-07 ENCOUNTER — Emergency Department: Payer: Medicaid Other

## 2018-12-07 ENCOUNTER — Other Ambulatory Visit: Payer: Self-pay

## 2018-12-07 DIAGNOSIS — S99921A Unspecified injury of right foot, initial encounter: Secondary | ICD-10-CM

## 2018-12-07 DIAGNOSIS — Y93K1 Activity, walking an animal: Secondary | ICD-10-CM | POA: Insufficient documentation

## 2018-12-07 DIAGNOSIS — Y92008 Other place in unspecified non-institutional (private) residence as the place of occurrence of the external cause: Secondary | ICD-10-CM | POA: Diagnosis not present

## 2018-12-07 DIAGNOSIS — W2209XA Striking against other stationary object, initial encounter: Secondary | ICD-10-CM | POA: Diagnosis not present

## 2018-12-07 DIAGNOSIS — F17210 Nicotine dependence, cigarettes, uncomplicated: Secondary | ICD-10-CM | POA: Insufficient documentation

## 2018-12-07 DIAGNOSIS — Y999 Unspecified external cause status: Secondary | ICD-10-CM | POA: Diagnosis not present

## 2018-12-07 MED ORDER — MELOXICAM 15 MG PO TABS: 15.0000 mg | ORAL_TABLET | Freq: Every day | ORAL | 0 refills | Status: DC

## 2018-12-07 NOTE — ED Notes (Signed)
Pt to the ER for pain to the right foot and 3rd toe. Pt walking outside and kicked the wooden post to the porch. Pt has bruising and swelling to the 3rd toe. Pain with ambulation.

## 2018-12-07 NOTE — ED Notes (Signed)
Wooden shoe applied. 

## 2018-12-07 NOTE — ED Triage Notes (Signed)
Pt reports she hit her foot on piece of wood on Wednesday and since has had bruising, pain and swelling to the right 2nd and 3rd toes.

## 2018-12-07 NOTE — ED Provider Notes (Signed)
[facility] Emergency Department Provider Note  ____________________________________________  Time seen: Approximately 10:40 PM  I have reviewed the triage vital signs and the nursing notes.   HISTORY  Chief Complaint Toe Injury    HPI Megan Bentley is a 33 y.o. female who presents the emergency department complaining of pain to the second toe of the right foot.  Patient reports that she was trying to get her dog inside the house when she accidentally kicked a wooden post on the porch.  Patient has had pain, swelling, ecchymosis to the second digit of the right foot.  Patient is able to bear weight but doing so increases her pain.  No other injury or complaint prior to arrival.         Past Medical History:  Diagnosis Date  . Anxiety   . Arthritis   . Kidney stone   . Migraines   . Renal disorder   . 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 Kidney stones 07/19/2015  . Adnexal mass 07/19/2015  . Gross hematuria 07/19/2015    Past Surgical History:  Procedure Laterality Date  . CYSTOSCOPY W/ RETROGRADES Bilateral 08/06/2015   Procedure: CYSTOSCOPY WITH RETROGRADE PYELOGRAM;  Surgeon: Vanna ScotlandAshley Brandon, MD;  Location: [facility] ORS;  Service: Urology;  Laterality: Bilateral;  . CYSTOSCOPY/URETEROSCOPY/HOLMIUM LASER/STENT PLACEMENT Left 08/06/2015   Procedure: CYSTOSCOPY/URETEROSCOPY/HOLMIUM LASER/STENT PLACEMENT;  Surgeon: Vanna ScotlandAshley Brandon, MD;  Location: [facility] ORS;  Service: Urology;  Laterality: Left;  . NO PAST SURGERIES    . none      Prior to Admission medications   Medication Sig Start Date End Date Taking? Authorizing Provider  meloxicam (MOBIC) 15 MG tablet Take 1 tablet (15 mg total) by mouth daily. 12/07/18   Louie Flenner, Delorise RoyalsJonathan D, PA-C    Allergies Sulfa antibiotics  Family History  Problem Relation Age of Onset  . Multiple sclerosis Other   . Sjogren's syndrome Other        Aunt kim  . Kidney  Stones Other   . Kidney disease Neg Hx   . Bladder Cancer Neg Hx     Social History Social History   Tobacco Use  . Smoking status: Current Every Day Smoker    Packs/day: 0.30    Years: 11.00    Pack years: 3.30    Types: Cigarettes  . Smokeless tobacco: Never Used  Substance Use Topics  . Alcohol use: No    Alcohol/week: 0.0 standard drinks  . Drug use: Yes    Types: Marijuana     Review of Systems  Constitutional: No fever/chills Eyes: No visual changes. No discharge ENT: No upper respiratory complaints. Cardiovascular: no chest pain. Respiratory: no cough. No SOB. Gastrointestinal: No abdominal pain.  No nausea, no vomiting.  No diarrhea.  No constipation. Musculoskeletal: Positive for pain to the second digit of the right foot Skin: Negative for rash, abrasions, lacerations, ecchymosis. Neurological: Negative for headaches, focal weakness or numbness. 10-point ROS otherwise negative.  ____________________________________________   PHYSICAL EXAM:  VITAL SIGNS: ED Triage Vitals [12/07/18 1937]  Enc Vitals Group     BP 138/80     Pulse Rate 78     Resp 17     Temp 98.1 F (36.7 C)     Temp Source Oral     SpO2 100 %     Weight      Height      Head Circumference  Peak Flow      Pain Score      Pain Loc      Pain Edu?      Excl. in [place]?      Constitutional: Alert and oriented. Well appearing and in no acute distress. Eyes: Conjunctivae are normal. PERRL. EOMI. Head: Atraumatic. Neck: No stridor.    Cardiovascular: Normal rate, regular rhythm. Normal S1 and S2.  Good peripheral circulation. Respiratory: Normal respiratory effort without tachypnea or retractions. Lungs CTAB. Good air entry to the bases with no decreased or absent breath sounds. Musculoskeletal: Full range of motion to all extremities. No gross deformities appreciated.  Visualization of the right foot reveals mild edema, ecchymosis of the second digit.  No angulation.  Sensation and  capillary refill intact.  No other obvious abnormality to the right foot.  No other tenderness to palpation other than the second digit.  Dorsalis pedis pulse intact.  Sensation intact all remaining digits. Neurologic:  Normal speech and language. No gross focal neurologic deficits are appreciated.  Skin:  Skin is warm, dry and intact. No rash noted. Psychiatric: Mood and affect are normal. Speech and behavior are normal. Patient exhibits appropriate insight and judgement.   ____________________________________________   LABS (all labs ordered are listed, but only abnormal results are displayed)  Labs Reviewed - No data to display ____________________________________________  EKG   ____________________________________________  RADIOLOGY I personally viewed and evaluated these images as part of my medical decision making, as well as reviewing the written report by the radiologist.  I have reviewed the imaging.  I concur with radiologist finding of slight asymmetry of the second PIP joint.  When visualizing patient's external toe, no deformity or angulation.  No palpable abnormality to this region.  Dg Foot Complete Right  Result Date: 12/07/2018 CLINICAL DATA:  Bruising and pain to the right second and third toes EXAM: RIGHT FOOT COMPLETE - 3+ VIEW COMPARISON:  12/01/2013 FINDINGS: No radiopaque foreign body. Atypical appearance of the second PIP joint on the frontal view, normal in appearance on the oblique view and not well delineated on the lateral view. Bones of the right foot are otherwise within normal limits. IMPRESSION: Slight asymmetrical appearance of the second IP joint on one view, questionable for malalignment. Suggest emphasis views of the second digit Electronically Signed   By: Donavan Foil M.D.   On: 12/07/2018 20:13    ____________________________________________    PROCEDURES  Procedure(s) performed:    Procedures    Medications - No data to  display   ____________________________________________   INITIAL IMPRESSION / ASSESSMENT AND PLAN / ED COURSE  Pertinent labs & imaging results that were available during my care of the patient were reviewed by me and considered in my medical decision making (see chart for details).  Review of the [place] CSRS was performed in accordance of the [place] prior to dispensing any controlled drugs.           Patient's diagnosis is consistent with toe injury.  Patient presents emergency department after sustaining an injury to the second digit of the right foot.  Examination reveals edema, ecchymosis to the digit.  No deformity.  Findings on imaging were concerning for possible joint asymmetry.  When comparing this digit with patient's surrounding digits, no significant deviation, deformity is appreciated.  Patient is able to extend and flex the digit with coaxing.  No palpable abnormality.  I have a very low suspicion for subluxation or underlying fracture.  I discussed treatment should  this either be a small fracture or subluxation patient verbalizes understanding of this.  There is no indication of ligamentous injury.  Patient will be placed on meloxicam daily.  Postop shoe given for symptom relief.  Follow-up with primary care or podiatry as needed.   Patient is given ED precautions to return to the ED for any worsening or new symptoms.     ____________________________________________  FINAL CLINICAL IMPRESSION(S) / ED DIAGNOSES  Final diagnoses:  Injury of toe on right foot, initial encounter      NEW MEDICATIONS STARTED DURING THIS VISIT:  ED Discharge Orders         Ordered    meloxicam (MOBIC) 15 MG tablet  Daily     12/07/18 2244              This chart was dictated using voice recognition software/Dragon. Despite best efforts to proofread, errors can occur which can change the meaning. Any change was purely unintentional.    Racheal PatchesCuthriell, Liisa Picone D, PA-C 12/07/18 2244     Arnaldo NatalMalinda, Paul F, MD 12/07/18 [phone]

## 2019-01-16 ENCOUNTER — Emergency Department: Payer: Medicaid Other

## 2019-01-16 ENCOUNTER — Encounter: Payer: Self-pay | Admitting: Medical Oncology

## 2019-01-16 ENCOUNTER — Emergency Department
Admission: EM | Admit: 2019-01-16 | Discharge: 2019-01-16 | Disposition: A | Discharge status: Home / Self Care | Payer: Medicaid Other | Attending: Emergency Medicine | Admitting: Emergency Medicine

## 2019-01-16 ENCOUNTER — Other Ambulatory Visit: Payer: Self-pay

## 2019-01-16 DIAGNOSIS — M546 Pain in thoracic spine: Secondary | ICD-10-CM

## 2019-01-16 DIAGNOSIS — R071 Chest pain on breathing: Secondary | ICD-10-CM | POA: Insufficient documentation

## 2019-01-16 DIAGNOSIS — M549 Dorsalgia, unspecified: Secondary | ICD-10-CM | POA: Diagnosis present

## 2019-01-16 DIAGNOSIS — F17210 Nicotine dependence, cigarettes, uncomplicated: Secondary | ICD-10-CM | POA: Insufficient documentation

## 2019-01-16 DIAGNOSIS — Z79899 Other long term (current) drug therapy: Secondary | ICD-10-CM | POA: Insufficient documentation

## 2019-01-16 DIAGNOSIS — R079 Chest pain, unspecified: Secondary | ICD-10-CM | POA: Diagnosis not present

## 2019-01-16 DIAGNOSIS — F1210 Cannabis abuse, uncomplicated: Secondary | ICD-10-CM | POA: Diagnosis not present

## 2019-01-16 LAB — CBC WITH DIFFERENTIAL/PLATELET
Abs Immature Granulocytes: 0.06 10*3/uL (ref 0.00–0.07)
Basophils Absolute: 0 10*3/uL (ref 0.0–0.1)
Basophils Relative: 1 %
Eosinophils Absolute: 0.1 10*3/uL (ref 0.0–0.5)
Eosinophils Relative: 2 %
HCT: 40.9 % (ref 36.0–46.0)
Hemoglobin: 14 g/dL (ref 12.0–15.0)
Immature Granulocytes: 1 %
Lymphocytes Relative: 19 %
Lymphs Abs: 1.3 10*3/uL (ref 0.7–4.0)
MCH: 33.9 pg (ref 26.0–34.0)
MCHC: 34.2 g/dL (ref 30.0–36.0)
MCV: 99 fL (ref 80.0–100.0)
Monocytes Absolute: 0.6 10*3/uL (ref 0.1–1.0)
Monocytes Relative: 8 %
Neutro Abs: 5.1 10*3/uL (ref 1.7–7.7)
Neutrophils Relative %: 69 %
Platelets: 258 10*3/uL (ref 150–400)
RBC: 4.13 MIL/uL (ref 3.87–5.11)
RDW: 12.1 % (ref 11.5–15.5)
WBC: 7.2 10*3/uL (ref 4.0–10.5)
nRBC: 0 % (ref 0.0–0.2)

## 2019-01-16 LAB — BASIC METABOLIC PANEL
Anion gap: 11 (ref 5–15)
BUN: 14 mg/dL (ref 6–20)
CO2: 24 mmol/L (ref 22–32)
Calcium: 9.4 mg/dL (ref 8.9–10.3)
Chloride: 103 mmol/L (ref 98–111)
Creatinine, Ser: 0.7 mg/dL (ref 0.44–1.00)
GFR calc Af Amer: >60 mL/min (ref >60)
GFR calc non Af Amer: >60 mL/min (ref >60)
Glucose, Bld: 86 mg/dL (ref 70–99)
Potassium: 4 mmol/L (ref 3.5–5.1)
Sodium: 138 mmol/L (ref 135–145)

## 2019-01-16 LAB — URINALYSIS, COMPLETE (UACMP) WITH MICROSCOPIC
Bilirubin Urine: NEGATIVE
Glucose, UA: NEGATIVE mg/dL
Ketones, ur: 5 mg/dL — AB
Leukocytes,Ua: NEGATIVE
Nitrite: NEGATIVE
Protein, ur: NEGATIVE mg/dL
Specific Gravity, Urine: 1.004 — ABNORMAL LOW (ref 1.005–1.030)
pH: 5 (ref 5.0–8.0)

## 2019-01-16 LAB — CK: Total CK: 51 U/L (ref 38–234)

## 2019-01-16 LAB — POCT PREGNANCY, URINE: Preg Test, Ur: NEGATIVE

## 2019-01-16 MED ORDER — NAPROXEN 500 MG PO TABS
500.0000 mg | ORAL_TABLET | Freq: Once | ORAL | Status: AC
  Administered 2019-01-16: 500 mg via ORAL
  Filled 2019-01-16: qty 1

## 2019-01-16 MED ORDER — LIDOCAINE 5 % EX PTCH
1.0000 | MEDICATED_PATCH | CUTANEOUS | Status: DC
  Administered 2019-01-16: 1 via TRANSDERMAL
  Filled 2019-01-16: qty 1

## 2019-01-16 MED ORDER — LACTATED RINGERS IV BOLUS
500.0000 mL | Freq: Once | INTRAVENOUS | Status: AC
  Administered 2019-01-16: 500 mL via INTRAVENOUS

## 2019-01-16 MED ORDER — LIDOCAINE 5 % EX PTCH: 1.0000 | MEDICATED_PATCH | Freq: Two times a day (BID) | CUTANEOUS | 0 refills | Status: DC

## 2019-01-16 NOTE — ED Triage Notes (Signed)
Pt reports that for the past 3 days she has had upper back pain with radiation of pain into chest. Pt reports she has sob at times. States that she is currently being tested for MS. A/O x 4, NAD noted.

## 2019-01-16 NOTE — ED Provider Notes (Signed)
[facility] Emergency Department Provider Note   ____________________________________________   First MD Initiated Contact with Patient 01/16/19 [phone]     (approximate)  I have reviewed the triage vital signs and the nursing notes.   HISTORY  Chief Complaint Back Pain and Chest Pain    HPI Megan Bentley is a 33 y.o. female with past medical history of kidney stones and arthritis who presents to the ED complaining of back pain and chest pain.  Patient reports that she first developed pain in her mid back overnight, describes it as an ache that seems to wax and wane.  There are no specific exacerbating or alleviating factors.  This morning, pain began to shoot up into her anterior chest with occasional sharp and electric pains.  When pain is severe it seems to make it difficult for her to breathe, but she denies any fevers or cough.  She has not had any pain or swelling in her legs, recent travel, or recent surgery/chemotherapy.  She has not taking anything for her symptoms at home.  She denies any sick contacts.  Her last menstrual period was approximately 3 weeks ago.  She has not noticed any hematuria or dysuria.  She denies any trauma to her chest or back.        Past Medical History:  Diagnosis Date  . Anxiety   . Arthritis   . Kidney stone   . Migraines   . Renal disorder   . Reynolds syndrome [facility])     Patient Active Problem List   Diagnosis Date Noted  . Kidney stones 07/19/2015  . Adnexal mass 07/19/2015  . Gross hematuria 07/19/2015    Past Surgical History:  Procedure Laterality Date  . CYSTOSCOPY W/ RETROGRADES Bilateral 08/06/2015   Procedure: CYSTOSCOPY WITH RETROGRADE PYELOGRAM;  Surgeon: Hollice Espy, MD;  Location: [facility] ORS;  Service: Urology;  Laterality: Bilateral;  . CYSTOSCOPY/URETEROSCOPY/HOLMIUM LASER/STENT PLACEMENT Left 08/06/2015   Procedure: CYSTOSCOPY/URETEROSCOPY/HOLMIUM LASER/STENT PLACEMENT;  Surgeon: Hollice Espy,  MD;  Location: [facility] ORS;  Service: Urology;  Laterality: Left;  . NO PAST SURGERIES    . none      Prior to Admission medications   Medication Sig Start Date End Date Taking? Authorizing Provider  lidocaine (LIDODERM) 5 % Place 1 patch onto the skin every 12 (twelve) hours. Remove & Discard patch within 12 hours or as directed by MD 01/16/19 01/16/20  Blake Divine, MD  meloxicam (MOBIC) 15 MG tablet Take 1 tablet (15 mg total) by mouth daily. 12/07/18   Cuthriell, Charline Bills, PA-C    Allergies Sulfa antibiotics  Family History  Problem Relation Age of Onset  . Multiple sclerosis Other   . Sjogren's syndrome Other        Aunt kim  . Kidney Stones Other   . Kidney disease Neg Hx   . Bladder Cancer Neg Hx     Social History Social History   Tobacco Use  . Smoking status: Current Every Day Smoker    Packs/day: 0.30    Years: 11.00    Pack years: 3.30    Types: Cigarettes  . Smokeless tobacco: Never Used  Substance Use Topics  . Alcohol use: No    Alcohol/week: 0.0 standard drinks  . Drug use: Yes    Types: Marijuana    Review of Systems  Constitutional: No fever/chills Eyes: No visual changes. ENT: No sore throat. Cardiovascular: Positive for chest pain Respiratory: Positive for shortness of breath Gastrointestinal: No abdominal pain.  No nausea, no vomiting.  No diarrhea.  No constipation. Genitourinary: Negative for dysuria. Musculoskeletal: Positive for back pain Skin: Negative for rash. Neurological: Negative for headaches, focal weakness or numbness.  ____________________________________________   PHYSICAL EXAM:  VITAL SIGNS: ED Triage Vitals  Enc Vitals Group     BP 01/16/19 0908 113/78     Pulse Rate 01/16/19 0908 74     Resp 01/16/19 0908 18     Temp 01/16/19 0908 99.5 F (37.5 C)     Temp Source 01/16/19 0908 Oral     SpO2 01/16/19 0908 100 %     Weight 01/16/19 0857 100 lb (45.4 kg)     Height 01/16/19 0857 5\' 4"  (1.626 m)     Head  Circumference --      Peak Flow --      Pain Score 01/16/19 0857 8     Pain Loc --      Pain Edu? --      Excl. in [place]? --     Constitutional: Alert and oriented. Eyes: Conjunctivae are normal. Head: Atraumatic. Nose: No congestion/rhinnorhea. Mouth/Throat: Mucous membranes are moist. Neck: Normal ROM Cardiovascular: Normal rate, regular rhythm. Grossly normal heart sounds. Respiratory: Normal respiratory effort.  No retractions. Lungs CTAB. Gastrointestinal: Soft and nontender. No distention. Genitourinary: deferred Musculoskeletal: No lower extremity tenderness nor edema.  Tenderness paraspinally to mid back. Neurologic:  Normal speech and language. No gross focal neurologic deficits are appreciated. Skin:  Skin is warm, dry and intact. No rash noted. Psychiatric: Mood and affect are normal. Speech and behavior are normal.  ____________________________________________   LABS (all labs ordered are listed, but only abnormal results are displayed)  Labs Reviewed  URINALYSIS, COMPLETE (UACMP) WITH MICROSCOPIC - Abnormal; Notable for the following components:      Result Value   Color, Urine STRAW (*)    APPearance HAZY (*)    Specific Gravity, Urine 1.004 (*)    Hgb urine dipstick LARGE (*)    Ketones, ur 5 (*)    Bacteria, UA RARE (*)    All other components within normal limits  BASIC METABOLIC PANEL  CBC WITH DIFFERENTIAL/PLATELET  CK  POC URINE PREG, ED  POCT PREGNANCY, URINE   ____________________________________________  EKG  ED ECG REPORT I, Chesley Noonharles Ruhee Enck, the attending physician, personally viewed and interpreted this ECG.   Date: 01/16/2019  EKG Time: 4:38 PM  Rate: 83  Rhythm: normal EKG, normal sinus rhythm, unchanged from previous tracings, occasional PVC noted, unifocal  Axis: Normal  Intervals:none  ST&T Change: None   ____________________________________________   PROCEDURES  Procedure(s) performed (including Critical Care):  Procedures    ____________________________________________   INITIAL IMPRESSION / ASSESSMENT AND PLAN / ED COURSE       33 year old female presenting with pain in her mid back as well as pain in her chest over the past few days.  Do not suspect ACS as patient is low risk with unremarkable EKG.  Chest x-ray negative for acute process.  Urine pregnancy negative and UA unremarkable.  CPK within normal limits, do not suspect rhabdo.  It appears there is some element of chronic back pain, for which patient already takes baclofen.  Do not suspect kidney stone given constant aching pain that does not lateralize and no blood on UA.  Counseled patient on need to follow-up with PCP and return to the ED for new or worsening symptoms.  Patient became upset at the time of discharge, requesting MRI to "find out what  is wrong with me".  Counseled patient that this was not indicated and that she needs to follow-up with her PCP as well as her neurologist.      ____________________________________________   FINAL CLINICAL IMPRESSION(S) / ED DIAGNOSES  Final diagnoses:  Chest pain, unspecified type  Acute bilateral thoracic back pain     ED Discharge Orders         Ordered    lidocaine (LIDODERM) 5 %  Every 12 hours     01/16/19 1120           Note:  This document was prepared using Dragon voice recognition software and may include unintentional dictation errors.   Chesley NoonJessup, Hanah Moultry, MD 01/16/19 [phone]

## 2019-01-16 NOTE — ED Notes (Signed)
Upon entering room for discharge patient states she already removed her IV and snatched paperwork from nurse's hands.  This RN attempting to explain to patient what and why tests were done and importance for follow up.  Patient cutting this RN off stating "I don't care, I'm just going to go to another hospital that will actually do something for me".  Pt stormed out of room at this time, refusing discharge vitals or signature.

## 2019-01-16 NOTE — ED Notes (Signed)
AAOx3.  Skin warm and dry.  Patient tearful in room.  Patient states "I just want to feel better".  Emotional support given, moderately effective. Continue to monitor.

## 2019-04-25 ENCOUNTER — Other Ambulatory Visit: Payer: Self-pay

## 2019-04-25 DIAGNOSIS — Z20822 Contact with and (suspected) exposure to covid-19: Secondary | ICD-10-CM

## 2019-04-27 LAB — NOVEL CORONAVIRUS, NAA: SARS-CoV-2, NAA: NOT DETECTED

## 2019-06-06 DIAGNOSIS — M545 Low back pain: Secondary | ICD-10-CM | POA: Diagnosis not present

## 2019-06-06 DIAGNOSIS — R5383 Other fatigue: Secondary | ICD-10-CM | POA: Diagnosis not present

## 2019-06-06 DIAGNOSIS — R61 Generalized hyperhidrosis: Secondary | ICD-10-CM | POA: Diagnosis not present

## 2019-06-06 DIAGNOSIS — R634 Abnormal weight loss: Secondary | ICD-10-CM | POA: Diagnosis not present

## 2019-06-06 DIAGNOSIS — Z1389 Encounter for screening for other disorder: Secondary | ICD-10-CM | POA: Diagnosis not present

## 2019-06-06 DIAGNOSIS — F410 Panic disorder [episodic paroxysmal anxiety] without agoraphobia: Secondary | ICD-10-CM | POA: Diagnosis not present

## 2019-09-14 DIAGNOSIS — O219 Vomiting of pregnancy, unspecified: Secondary | ICD-10-CM | POA: Diagnosis not present

## 2019-09-14 DIAGNOSIS — O2311 Infections of bladder in pregnancy, first trimester: Secondary | ICD-10-CM | POA: Diagnosis not present

## 2019-09-14 DIAGNOSIS — O99331 Smoking (tobacco) complicating pregnancy, first trimester: Secondary | ICD-10-CM | POA: Diagnosis not present

## 2019-09-14 DIAGNOSIS — O99351 Diseases of the nervous system complicating pregnancy, first trimester: Secondary | ICD-10-CM | POA: Diagnosis not present

## 2019-09-14 DIAGNOSIS — O26891 Other specified pregnancy related conditions, first trimester: Secondary | ICD-10-CM | POA: Diagnosis not present

## 2019-09-14 DIAGNOSIS — Z3A01 Less than 8 weeks gestation of pregnancy: Secondary | ICD-10-CM | POA: Diagnosis not present

## 2019-09-14 DIAGNOSIS — Z20822 Contact with and (suspected) exposure to covid-19: Secondary | ICD-10-CM | POA: Diagnosis not present

## 2019-09-14 DIAGNOSIS — R55 Syncope and collapse: Secondary | ICD-10-CM | POA: Diagnosis not present

## 2019-09-14 DIAGNOSIS — R1030 Lower abdominal pain, unspecified: Secondary | ICD-10-CM | POA: Diagnosis not present

## 2019-09-14 DIAGNOSIS — I213 ST elevation (STEMI) myocardial infarction of unspecified site: Secondary | ICD-10-CM | POA: Diagnosis not present

## 2019-09-14 DIAGNOSIS — F17210 Nicotine dependence, cigarettes, uncomplicated: Secondary | ICD-10-CM | POA: Diagnosis not present

## 2019-09-28 DIAGNOSIS — O3680X0 Pregnancy with inconclusive fetal viability, not applicable or unspecified: Secondary | ICD-10-CM | POA: Diagnosis not present

## 2019-09-28 DIAGNOSIS — Z3401 Encounter for supervision of normal first pregnancy, first trimester: Secondary | ICD-10-CM | POA: Diagnosis not present

## 2019-10-01 DIAGNOSIS — Z3201 Encounter for pregnancy test, result positive: Secondary | ICD-10-CM | POA: Diagnosis not present

## 2019-10-24 DIAGNOSIS — F419 Anxiety disorder, unspecified: Secondary | ICD-10-CM | POA: Diagnosis not present

## 2019-10-24 DIAGNOSIS — M069 Rheumatoid arthritis, unspecified: Secondary | ICD-10-CM | POA: Diagnosis not present

## 2019-10-24 DIAGNOSIS — Z3A11 11 weeks gestation of pregnancy: Secondary | ICD-10-CM | POA: Diagnosis not present

## 2019-10-24 DIAGNOSIS — I7300 Raynaud's syndrome without gangrene: Secondary | ICD-10-CM | POA: Diagnosis not present

## 2019-10-24 DIAGNOSIS — O99891 Other specified diseases and conditions complicating pregnancy: Secondary | ICD-10-CM | POA: Diagnosis not present

## 2019-10-24 DIAGNOSIS — O99341 Other mental disorders complicating pregnancy, first trimester: Secondary | ICD-10-CM | POA: Diagnosis not present

## 2019-10-24 DIAGNOSIS — O99411 Diseases of the circulatory system complicating pregnancy, first trimester: Secondary | ICD-10-CM | POA: Diagnosis not present

## 2019-10-26 DIAGNOSIS — F419 Anxiety disorder, unspecified: Secondary | ICD-10-CM | POA: Diagnosis not present

## 2019-11-06 DIAGNOSIS — I7300 Raynaud's syndrome without gangrene: Secondary | ICD-10-CM | POA: Diagnosis not present

## 2019-11-06 DIAGNOSIS — Z3A13 13 weeks gestation of pregnancy: Secondary | ICD-10-CM | POA: Diagnosis not present

## 2019-11-06 DIAGNOSIS — M069 Rheumatoid arthritis, unspecified: Secondary | ICD-10-CM | POA: Diagnosis not present

## 2019-11-06 DIAGNOSIS — F419 Anxiety disorder, unspecified: Secondary | ICD-10-CM | POA: Diagnosis not present

## 2019-11-06 DIAGNOSIS — O99341 Other mental disorders complicating pregnancy, first trimester: Secondary | ICD-10-CM | POA: Diagnosis not present

## 2019-11-06 DIAGNOSIS — R109 Unspecified abdominal pain: Secondary | ICD-10-CM | POA: Diagnosis not present

## 2019-11-06 DIAGNOSIS — Z882 Allergy status to sulfonamides status: Secondary | ICD-10-CM | POA: Diagnosis not present

## 2019-11-06 DIAGNOSIS — Z87891 Personal history of nicotine dependence: Secondary | ICD-10-CM | POA: Diagnosis not present

## 2019-11-06 DIAGNOSIS — O99411 Diseases of the circulatory system complicating pregnancy, first trimester: Secondary | ICD-10-CM | POA: Diagnosis not present

## 2019-11-06 DIAGNOSIS — O99891 Other specified diseases and conditions complicating pregnancy: Secondary | ICD-10-CM | POA: Diagnosis not present

## 2019-11-09 DIAGNOSIS — Z124 Encounter for screening for malignant neoplasm of cervix: Secondary | ICD-10-CM | POA: Diagnosis not present

## 2019-11-09 DIAGNOSIS — Z3480 Encounter for supervision of other normal pregnancy, unspecified trimester: Secondary | ICD-10-CM | POA: Diagnosis not present

## 2019-11-09 DIAGNOSIS — F410 Panic disorder [episodic paroxysmal anxiety] without agoraphobia: Secondary | ICD-10-CM | POA: Diagnosis not present

## 2019-11-27 DIAGNOSIS — F411 Generalized anxiety disorder: Secondary | ICD-10-CM | POA: Diagnosis not present

## 2019-11-27 DIAGNOSIS — B373 Candidiasis of vulva and vagina: Secondary | ICD-10-CM | POA: Diagnosis not present

## 2019-11-27 DIAGNOSIS — F419 Anxiety disorder, unspecified: Secondary | ICD-10-CM | POA: Diagnosis not present

## 2019-11-27 DIAGNOSIS — R102 Pelvic and perineal pain: Secondary | ICD-10-CM | POA: Diagnosis not present

## 2019-12-04 DIAGNOSIS — O99342 Other mental disorders complicating pregnancy, second trimester: Secondary | ICD-10-CM | POA: Diagnosis not present

## 2019-12-04 DIAGNOSIS — O26892 Other specified pregnancy related conditions, second trimester: Secondary | ICD-10-CM | POA: Diagnosis not present

## 2019-12-04 DIAGNOSIS — O2342 Unspecified infection of urinary tract in pregnancy, second trimester: Secondary | ICD-10-CM | POA: Diagnosis not present

## 2019-12-04 DIAGNOSIS — Z3482 Encounter for supervision of other normal pregnancy, second trimester: Secondary | ICD-10-CM | POA: Diagnosis not present

## 2019-12-04 DIAGNOSIS — O99332 Smoking (tobacco) complicating pregnancy, second trimester: Secondary | ICD-10-CM | POA: Diagnosis not present

## 2019-12-04 DIAGNOSIS — F17210 Nicotine dependence, cigarettes, uncomplicated: Secondary | ICD-10-CM | POA: Diagnosis not present

## 2019-12-04 DIAGNOSIS — F411 Generalized anxiety disorder: Secondary | ICD-10-CM | POA: Diagnosis not present

## 2019-12-04 DIAGNOSIS — Z79899 Other long term (current) drug therapy: Secondary | ICD-10-CM | POA: Diagnosis not present

## 2019-12-04 DIAGNOSIS — Z3A17 17 weeks gestation of pregnancy: Secondary | ICD-10-CM | POA: Diagnosis not present

## 2019-12-04 DIAGNOSIS — R300 Dysuria: Secondary | ICD-10-CM | POA: Diagnosis not present

## 2019-12-04 DIAGNOSIS — O0992 Supervision of high risk pregnancy, unspecified, second trimester: Secondary | ICD-10-CM | POA: Diagnosis not present

## 2019-12-19 DIAGNOSIS — Z3482 Encounter for supervision of other normal pregnancy, second trimester: Secondary | ICD-10-CM | POA: Diagnosis not present

## 2019-12-19 DIAGNOSIS — Z3689 Encounter for other specified antenatal screening: Secondary | ICD-10-CM | POA: Diagnosis not present

## 2019-12-19 DIAGNOSIS — Z3A19 19 weeks gestation of pregnancy: Secondary | ICD-10-CM | POA: Diagnosis not present

## 2020-01-14 ENCOUNTER — Other Ambulatory Visit: Payer: Self-pay

## 2020-01-14 ENCOUNTER — Observation Stay
Admission: EM | Admit: 2020-01-14 | Discharge: 2020-01-14 | Disposition: A | Discharge status: Home / Self Care | Payer: Medicaid Other | Attending: Advanced Practice Midwife | Admitting: Advanced Practice Midwife

## 2020-01-14 DIAGNOSIS — Z791 Long term (current) use of non-steroidal anti-inflammatories (NSAID): Secondary | ICD-10-CM | POA: Diagnosis not present

## 2020-01-14 DIAGNOSIS — O4192X0 Disorder of amniotic fluid and membranes, unspecified, second trimester, not applicable or unspecified: Secondary | ICD-10-CM

## 2020-01-14 DIAGNOSIS — O99342 Other mental disorders complicating pregnancy, second trimester: Secondary | ICD-10-CM | POA: Diagnosis not present

## 2020-01-14 DIAGNOSIS — N898 Other specified noninflammatory disorders of vagina: Secondary | ICD-10-CM | POA: Diagnosis present

## 2020-01-14 DIAGNOSIS — Z87891 Personal history of nicotine dependence: Secondary | ICD-10-CM | POA: Diagnosis not present

## 2020-01-14 DIAGNOSIS — Z79899 Other long term (current) drug therapy: Secondary | ICD-10-CM | POA: Diagnosis not present

## 2020-01-14 DIAGNOSIS — R32 Unspecified urinary incontinence: Secondary | ICD-10-CM

## 2020-01-14 DIAGNOSIS — F419 Anxiety disorder, unspecified: Secondary | ICD-10-CM | POA: Insufficient documentation

## 2020-01-14 DIAGNOSIS — R309 Painful micturition, unspecified: Secondary | ICD-10-CM | POA: Insufficient documentation

## 2020-01-14 DIAGNOSIS — M1990 Unspecified osteoarthritis, unspecified site: Secondary | ICD-10-CM | POA: Insufficient documentation

## 2020-01-14 DIAGNOSIS — O26892 Other specified pregnancy related conditions, second trimester: Secondary | ICD-10-CM | POA: Diagnosis not present

## 2020-01-14 DIAGNOSIS — Z3A23 23 weeks gestation of pregnancy: Secondary | ICD-10-CM | POA: Diagnosis not present

## 2020-01-14 DIAGNOSIS — N39498 Other specified urinary incontinence: Secondary | ICD-10-CM

## 2020-01-14 DIAGNOSIS — O4290 Premature rupture of membranes, unspecified as to length of time between rupture and onset of labor, unspecified weeks of gestation: Secondary | ICD-10-CM | POA: Diagnosis not present

## 2020-01-14 LAB — RUPTURE OF MEMBRANE (ROM)PLUS: Rom Plus: NEGATIVE

## 2020-01-14 LAB — URINALYSIS, ROUTINE W REFLEX MICROSCOPIC
Bilirubin Urine: NEGATIVE
Glucose, UA: NEGATIVE mg/dL
Hgb urine dipstick: NEGATIVE
Ketones, ur: NEGATIVE mg/dL
Nitrite: NEGATIVE
Protein, ur: NEGATIVE mg/dL
Specific Gravity, Urine: 1.015 (ref 1.005–1.030)
pH: 6 (ref 5.0–8.0)

## 2020-01-14 LAB — WET PREP, GENITAL
Clue Cells Wet Prep HPF POC: NONE SEEN
Sperm: NONE SEEN
Trich, Wet Prep: NONE SEEN
Yeast Wet Prep HPF POC: NONE SEEN

## 2020-01-14 LAB — FETAL FIBRONECTIN: Fetal Fibronectin: NEGATIVE

## 2020-01-14 NOTE — Discharge Summary (Signed)
Physician Final Progress Note  Patient ID: Megan Bentley MRN: [id number] DOB/AGE: 10-15-1985 34 y.o.  Admit date: 01/14/2020 Admitting provider: [name], CNM Discharge date: 01/14/2020   Admission Diagnoses: leaking fluid last night, vaginal irritation  Discharge Diagnoses:  Active Problems:   Vaginal irritation   [redacted] weeks gestation of pregnancy   Absence of bladder continence Positive fetal heart tones Normal labs No contractions  History of Present Illness: The patient is a 34 y.o. female G5P1031 at [redacted]w[redacted]d who presents for leaking fluid last night after voiding. She noticed a plate size area or wetness on her bed after using the bathroom. She also had back pain and denies contractions. She notes vaginal irritation and some pain with urination, and she denies itching, discharge or odor. She reports good fetal movement and denies vaginal bleeding.  Patient is admitted for observation, fetal heart tones and toco, labs collected, cervical exam. All monitoring, exam and labs are normal and reassuring. Patient is discharged to home with instructions and precautions.   Past Medical History:  Diagnosis Date  . Anxiety   . Arthritis   . Kidney stone   . Migraines   . Renal disorder   . Reynolds syndrome [facility])     Past Surgical History:  Procedure Laterality Date  . CYSTOSCOPY W/ RETROGRADES Bilateral 08/06/2015   Procedure: CYSTOSCOPY WITH RETROGRADE PYELOGRAM;  Surgeon: [name], MD;  Location: [facility] ORS;  Service: Urology;  Laterality: Bilateral;  . CYSTOSCOPY/URETEROSCOPY/HOLMIUM LASER/STENT PLACEMENT Left 08/06/2015   Procedure: CYSTOSCOPY/URETEROSCOPY/HOLMIUM LASER/STENT PLACEMENT;  Surgeon: [name], MD;  Location: [facility] ORS;  Service: Urology;  Laterality: Left;  . NO PAST SURGERIES    . none      No current facility-administered medications on file prior to encounter.   Current Outpatient Medications on File Prior to Encounter  Medication Sig Dispense  Refill  . clonazePAM (KLONOPIN) 0.5 MG tablet Take 0.25 mg by mouth 2 (two) times daily as needed for anxiety.    Marland Kitchen FLUoxetine (PROZAC) 10 MG tablet Take 10 mg by mouth daily.    . ondansetron (ZOFRAN) 4 MG tablet Take 4 mg by mouth every 8 (eight) hours as needed for nausea or vomiting.    . lidocaine (LIDODERM) 5 % Place 1 patch onto the skin every 12 (twelve) hours. Remove & Discard patch within 12 hours or as directed by MD (Patient not taking: Reported on 01/14/2020) 10 patch 0  . meloxicam (MOBIC) 15 MG tablet Take 1 tablet (15 mg total) by mouth daily. (Patient not taking: Reported on 01/14/2020) 30 tablet 0    Allergies  Allergen Reactions  . Sulfa Antibiotics Anaphylaxis    Social History   Socioeconomic History  . Marital status: Legally Separated    Spouse name: Not on file  . Number of children: Not on file  . Years of education: Not on file  . Highest education level: Not on file  Occupational History  . Not on file  Tobacco Use  . Smoking status: Former Smoker    Packs/day: 0.30    Years: 11.00    Pack years: 3.30    Types: Cigarettes    Quit date: 2021    Years since quitting: 0.5  . Smokeless tobacco: Never Used  Vaping Use  . Vaping Use: Never used  Substance and Sexual Activity  . Alcohol use: No    Alcohol/week: 0.0 standard drinks  . Drug use: Not Currently    Types: Marijuana  . Sexual activity: Not Currently  Other Topics Concern  . Not on file  Social History Narrative  . Not on file   Social Determinants of Health   Financial Resource Strain:   . Difficulty of Paying Living Expenses:   Food Insecurity:   . Worried About Programme researcher, broadcasting/film/video in the Last Year:   . Barista in the Last Year:   Transportation Needs:   . Freight forwarder (Medical):   Marland Kitchen Lack of Transportation (Non-Medical):   Physical Activity:   . Days of Exercise per Week:   . Minutes of Exercise per Session:   Stress:   . Feeling of Stress :   Social  Connections:   . Frequency of Communication with Friends and Family:   . Frequency of Social Gatherings with Friends and Family:   . Attends Religious Services:   . Active Member of Clubs or Organizations:   . Attends Banker Meetings:   Marland Kitchen Marital Status:   Intimate Partner Violence:   . Fear of Current or Ex-Partner:   . Emotionally Abused:   Marland Kitchen Physically Abused:   . Sexually Abused:     Family History  Problem Relation Age of Onset  . Multiple sclerosis Other   . Sjogren's syndrome Other        Aunt kim  . Kidney Stones Other   . Kidney disease Neg Hx   . Bladder Cancer Neg Hx      Review of Systems  Constitutional: Negative for chills and fever.  HENT: Negative for congestion, ear discharge, ear pain, hearing loss, sinus pain and sore throat.   Eyes: Negative for blurred vision and double vision.  Respiratory: Negative for cough, shortness of breath and wheezing.   Cardiovascular: Negative for chest pain, palpitations and leg swelling.  Gastrointestinal: Negative for abdominal pain, blood in stool, constipation, diarrhea, heartburn, melena, nausea and vomiting.  Genitourinary: Negative for dysuria, flank pain, frequency, hematuria and urgency.       Positive for fluid leaking, vaginal irritation, pain with urination  Musculoskeletal: Negative for back pain, joint pain and myalgias.  Skin: Negative for itching and rash.  Neurological: Negative for dizziness, tingling, tremors, sensory change, speech change, focal weakness, seizures, loss of consciousness, weakness and headaches.  Endo/Heme/Allergies: Negative for environmental allergies. Does not bruise/bleed easily.  Psychiatric/Behavioral: Negative for depression, hallucinations, memory loss, substance abuse and suicidal ideas. The patient is not nervous/anxious and does not have insomnia.      Physical Exam: BP (!) 107/56 (BP Location: Right Arm)   Pulse 71   Temp 99 F (37.2 C) (Oral)   Resp 17   LMP  08/06/2019   Constitutional: Well nourished, well developed female in no acute distress.  HEENT: normal Skin: Warm and dry.  Cardiovascular: Regular rate and rhythm.   Extremity: no edema  Respiratory: Clear to auscultation bilateral. Normal respiratory effort Abdomen: FHT present Back: no CVAT Neuro: DTRs 2+, Cranial nerves grossly intact Psych: Alert and Oriented x3. No memory deficits. Normal mood and affect.  MS: normal gait, normal bilateral lower extremity ROM/strength/stability.  Pelvic exam: (female chaperone present) is not limited by body habitus EGBUS: within normal limits Vagina: within normal limits and with normal mucosa, scant thin white discharge Cervix: closed/thick/high   Consults: None  Significant Findings/ Diagnostic Studies: labs:  Results for REMEE, CHARLEY (MRN [id number]) as of 01/14/2020 10:23  Ref. Range 01/14/2020 08:53 01/14/2020 08:54  Fetal Fibronectin Latest Ref Range: NEGATIVE  NEGATIVE   Yeast Wet Prep  HPF POC Latest Ref Range: NONE SEEN  NONE SEEN   Trich, Wet Prep Latest Ref Range: NONE SEEN  NONE SEEN   Clue Cells Wet Prep HPF POC Latest Ref Range: NONE SEEN  NONE SEEN   WBC, Wet Prep HPF POC Latest Ref Range: NONE SEEN  FEW (A)   URINALYSIS, ROUTINE W REFLEX MICROSCOPIC Unknown Rpt (A)   Appearance Latest Ref Range: CLEAR  HAZY (A)   Bilirubin Urine Latest Ref Range: NEGATIVE  NEGATIVE   Color, Urine Latest Ref Range: YELLOW  YELLOW (A)   Glucose, UA Latest Ref Range: NEGATIVE mg/dL NEGATIVE   Hgb urine dipstick Latest Ref Range: NEGATIVE  NEGATIVE   Ketones, ur Latest Ref Range: NEGATIVE mg/dL NEGATIVE   Leukocytes,Ua Latest Ref Range: NEGATIVE  TRACE (A)   Nitrite Latest Ref Range: NEGATIVE  NEGATIVE   pH Latest Ref Range: 5.0 - 8.0  6.0   Protein Latest Ref Range: NEGATIVE mg/dL NEGATIVE   Specific Gravity, Urine Latest Ref Range: 1.005 - 1.030  1.015   Bacteria, UA Latest Ref Range: NONE SEEN  RARE (A)   Mucus Unknown PRESENT   RBC / HPF  Latest Ref Range: 0 - 5 RBC/hpf 0-5   Squamous Epithelial / LPF Latest Ref Range: 0 - 5  6-10   WBC, UA Latest Ref Range: 0 - 5 WBC/hpf 0-5   WET PREP, GENITAL Unknown Rpt (A)   Rom Plus Unknown  NEGATIVE    Procedures: Toco/fetal heart tones Toco negative for contractions Fetal heart tones: 140s  Hospital Course: The patient was admitted to Labor and Delivery Triage for observation.   Discharge Condition: good  Disposition: Discharge disposition: 01-Home or Self Care  Diet: Regular diet  Discharge Activity: Activity as tolerated  Discharge Instructions    Discharge activity:  No Restrictions   Complete by: As directed    Discharge diet:  No restrictions   Complete by: As directed    No sexual activity restrictions   Complete by: As directed    Notify physician for a general feeling that "something is not right"   Complete by: As directed    Notify physician for increase or change in vaginal discharge   Complete by: As directed    Notify physician for intestinal cramps, with or without diarrhea, sometimes described as "gas pain"   Complete by: As directed    Notify physician for leaking of fluid   Complete by: As directed    Notify physician for low, dull backache, unrelieved by heat or Tylenol   Complete by: As directed    Notify physician for menstrual like cramps   Complete by: As directed    Notify physician for pelvic pressure   Complete by: As directed    Notify physician for uterine contractions.  These may be painless and feel like the uterus is tightening or the baby is  "balling up"   Complete by: As directed    Notify physician for vaginal bleeding   Complete by: As directed    PRETERM LABOR:  Includes any of the follwing symptoms that occur between 20 - [redacted] weeks gestation.  If these symptoms are not stopped, preterm labor can result in preterm delivery, placing your baby at risk   Complete by: As directed      Allergies as of 01/14/2020      Reactions    Sulfa Antibiotics Anaphylaxis      Medication List    STOP taking these medications  lidocaine 5 % Commonly known as: Lidoderm   meloxicam 15 MG tablet Commonly known as: MOBIC     TAKE these medications   clonazePAM 0.5 MG tablet Commonly known as: KLONOPIN Take 0.25 mg by mouth 2 (two) times daily as needed for anxiety.   FLUoxetine 10 MG tablet Commonly known as: PROZAC Take 10 mg by mouth daily.   ondansetron 4 MG tablet Commonly known as: ZOFRAN Take 4 mg by mouth every 8 (eight) hours as needed for nausea or vomiting.        Total time spent taking care of this patient: 30 minutes  Signed: [name], CNM  01/14/2020, 10:16 AM

## 2020-01-15 DIAGNOSIS — F419 Anxiety disorder, unspecified: Secondary | ICD-10-CM | POA: Diagnosis not present

## 2020-01-25 ENCOUNTER — Other Ambulatory Visit: Payer: Self-pay

## 2020-01-25 ENCOUNTER — Observation Stay
Admission: EM | Admit: 2020-01-25 | Discharge: 2020-01-25 | DRG: 833 | Disposition: A | Discharge status: Home / Self Care | Payer: Medicaid Other | Attending: Obstetrics and Gynecology | Admitting: Obstetrics and Gynecology

## 2020-01-25 ENCOUNTER — Encounter: Payer: Self-pay | Admitting: Obstetrics & Gynecology

## 2020-01-25 DIAGNOSIS — O212 Late vomiting of pregnancy: Secondary | ICD-10-CM | POA: Diagnosis present

## 2020-01-25 DIAGNOSIS — F1290 Cannabis use, unspecified, uncomplicated: Secondary | ICD-10-CM | POA: Diagnosis not present

## 2020-01-25 DIAGNOSIS — R112 Nausea with vomiting, unspecified: Secondary | ICD-10-CM | POA: Diagnosis present

## 2020-01-25 DIAGNOSIS — F419 Anxiety disorder, unspecified: Secondary | ICD-10-CM | POA: Diagnosis present

## 2020-01-25 DIAGNOSIS — O99322 Drug use complicating pregnancy, second trimester: Secondary | ICD-10-CM | POA: Diagnosis not present

## 2020-01-25 DIAGNOSIS — O99612 Diseases of the digestive system complicating pregnancy, second trimester: Principal | ICD-10-CM | POA: Diagnosis present

## 2020-01-25 DIAGNOSIS — O99342 Other mental disorders complicating pregnancy, second trimester: Secondary | ICD-10-CM | POA: Diagnosis present

## 2020-01-25 DIAGNOSIS — K529 Noninfective gastroenteritis and colitis, unspecified: Secondary | ICD-10-CM | POA: Diagnosis present

## 2020-01-25 DIAGNOSIS — O99613 Diseases of the digestive system complicating pregnancy, third trimester: Secondary | ICD-10-CM

## 2020-01-25 DIAGNOSIS — O479 False labor, unspecified: Secondary | ICD-10-CM | POA: Diagnosis not present

## 2020-01-25 DIAGNOSIS — O219 Vomiting of pregnancy, unspecified: Secondary | ICD-10-CM | POA: Diagnosis present

## 2020-01-25 DIAGNOSIS — R197 Diarrhea, unspecified: Secondary | ICD-10-CM | POA: Diagnosis not present

## 2020-01-25 DIAGNOSIS — Z3A24 24 weeks gestation of pregnancy: Secondary | ICD-10-CM | POA: Diagnosis not present

## 2020-01-25 DIAGNOSIS — R52 Pain, unspecified: Secondary | ICD-10-CM | POA: Diagnosis not present

## 2020-01-25 DIAGNOSIS — Z20822 Contact with and (suspected) exposure to covid-19: Secondary | ICD-10-CM | POA: Diagnosis present

## 2020-01-25 DIAGNOSIS — Z87891 Personal history of nicotine dependence: Secondary | ICD-10-CM | POA: Diagnosis not present

## 2020-01-25 LAB — URINE DRUG SCREEN, QUALITATIVE (ARMC ONLY)
Amphetamines, Ur Screen: NOT DETECTED
Barbiturates, Ur Screen: NOT DETECTED
Benzodiazepine, Ur Scrn: NOT DETECTED
Cannabinoid 50 Ng, Ur ~~LOC~~: POSITIVE — AB
Cocaine Metabolite,Ur ~~LOC~~: NOT DETECTED
MDMA (Ecstasy)Ur Screen: NOT DETECTED
Methadone Scn, Ur: NOT DETECTED
Opiate, Ur Screen: NOT DETECTED
Phencyclidine (PCP) Ur S: NOT DETECTED
Tricyclic, Ur Screen: NOT DETECTED

## 2020-01-25 LAB — URINALYSIS, COMPLETE (UACMP) WITH MICROSCOPIC
Bilirubin Urine: NEGATIVE
Glucose, UA: NEGATIVE mg/dL
Hgb urine dipstick: NEGATIVE
Ketones, ur: 20 mg/dL — AB
Leukocytes,Ua: NEGATIVE
Nitrite: NEGATIVE
Protein, ur: 30 mg/dL — AB
Specific Gravity, Urine: 1.014 (ref 1.005–1.030)
pH: 9 — ABNORMAL HIGH (ref 5.0–8.0)

## 2020-01-25 LAB — COMPREHENSIVE METABOLIC PANEL
ALT: 12 U/L (ref 0–44)
AST: 17 U/L (ref 15–41)
Albumin: 3.5 g/dL (ref 3.5–5.0)
Alkaline Phosphatase: 45 U/L (ref 38–126)
Anion gap: 13 (ref 5–15)
BUN: 9 mg/dL (ref 6–20)
CO2: 19 mmol/L — ABNORMAL LOW (ref 22–32)
Calcium: 9 mg/dL (ref 8.9–10.3)
Chloride: 104 mmol/L (ref 98–111)
Creatinine, Ser: 0.49 mg/dL (ref 0.44–1.00)
GFR calc Af Amer: >60 mL/min (ref >60)
GFR calc non Af Amer: >60 mL/min (ref >60)
Glucose, Bld: 109 mg/dL — ABNORMAL HIGH (ref 70–99)
Potassium: 3.3 mmol/L — ABNORMAL LOW (ref 3.5–5.1)
Sodium: 136 mmol/L (ref 135–145)
Total Bilirubin: 0.9 mg/dL (ref 0.3–1.2)
Total Protein: 6.5 g/dL (ref 6.5–8.1)

## 2020-01-25 LAB — CBC
HCT: 32.4 % — ABNORMAL LOW (ref 36.0–46.0)
Hemoglobin: 11.3 g/dL — ABNORMAL LOW (ref 12.0–15.0)
MCH: 33.6 pg (ref 26.0–34.0)
MCHC: 34.9 g/dL (ref 30.0–36.0)
MCV: 96.4 fL (ref 80.0–100.0)
Platelets: 243 10*3/uL (ref 150–400)
RBC: 3.36 MIL/uL — ABNORMAL LOW (ref 3.87–5.11)
RDW: 12.8 % (ref 11.5–15.5)
WBC: 15.1 10*3/uL — ABNORMAL HIGH (ref 4.0–10.5)
nRBC: 0 % (ref 0.0–0.2)

## 2020-01-25 LAB — SARS CORONAVIRUS 2 BY RT PCR (HOSPITAL ORDER, PERFORMED IN ~~LOC~~ HOSPITAL LAB): SARS Coronavirus 2: NEGATIVE

## 2020-01-25 LAB — LIPASE, BLOOD: Lipase: 32 U/L (ref 11–51)

## 2020-01-25 MED ORDER — KCL-LACTATED RINGERS-D5W 20 MEQ/L IV SOLN
INTRAVENOUS | Status: DC
  Filled 2020-01-25 (×3): qty 1000

## 2020-01-25 MED ORDER — LACTATED RINGERS IV BOLUS
1000.0000 mL | Freq: Once | INTRAVENOUS | Status: AC
  Administered 2020-01-25: 1000 mL via INTRAVENOUS

## 2020-01-25 MED ORDER — LACTATED RINGERS IV SOLN: INTRAVENOUS | Status: DC

## 2020-01-25 MED ORDER — POTASSIUM CHLORIDE 10 MEQ/100ML IV SOLN
10.0000 meq | Freq: Once | INTRAVENOUS | Status: AC
  Administered 2020-01-25: 10 meq via INTRAVENOUS
  Filled 2020-01-25: qty 100

## 2020-01-25 MED ORDER — ACETAMINOPHEN 10 MG/ML IV SOLN
1000.0000 mg | Freq: Once | INTRAVENOUS | Status: AC
  Administered 2020-01-25: 1000 mg via INTRAVENOUS
  Filled 2020-01-25: qty 100

## 2020-01-25 MED ORDER — FAMOTIDINE IN NACL 20-0.9 MG/50ML-% IV SOLN
20.0000 mg | Freq: Two times a day (BID) | INTRAVENOUS | Status: DC
  Filled 2020-01-25: qty 50

## 2020-01-25 MED ORDER — DEXTROSE IN LACTATED RINGERS 5 % IV SOLN: INTRAVENOUS | Status: DC

## 2020-01-25 MED ORDER — ONDANSETRON HCL 4 MG/2ML IJ SOLN
4.0000 mg | INTRAMUSCULAR | Status: DC | PRN
  Administered 2020-01-25 (×2): 4 mg via INTRAVENOUS
  Filled 2020-01-25 (×3): qty 2

## 2020-01-25 MED ORDER — PROMETHAZINE HCL 25 MG/ML IJ SOLN
12.5000 mg | INTRAMUSCULAR | Status: AC
  Administered 2020-01-25: 12.5 mg via INTRAVENOUS
  Filled 2020-01-25: qty 1

## 2020-01-25 MED ORDER — FAMOTIDINE IN NACL 20-0.9 MG/50ML-% IV SOLN
20.0000 mg | Freq: Once | INTRAVENOUS | Status: AC
  Administered 2020-01-25: 20 mg via INTRAVENOUS
  Filled 2020-01-25: qty 50

## 2020-01-25 MED ORDER — ONDANSETRON HCL 4 MG/2ML IJ SOLN
4.0000 mg | INTRAMUSCULAR | Status: AC
  Administered 2020-01-25: 4 mg via INTRAVENOUS
  Filled 2020-01-25: qty 2

## 2020-01-25 NOTE — OB Triage Note (Signed)
Pt [id number] at [redacted]w[redacted]d presents with complaints of N/V/D since 0300 this morning along with stomach pains that come and go. Denies LOF/bleeding. VSS. No known exposure to covid

## 2020-01-25 NOTE — Discharge Instructions (Signed)
Morning Sickness ° °Morning sickness is when you feel sick to your stomach (nauseous) during pregnancy. You may feel sick to your stomach and throw up (vomit). You may feel sick in the morning, but you can feel this way at any time of day. Some women feel very sick to their stomach and cannot stop throwing up (hyperemesis gravidarum). °Follow these instructions at home: °Medicines °· Take over-the-counter and prescription medicines only as told by your doctor. Do not take any medicines until you talk with your doctor about them first. °· Taking multivitamins before getting pregnant can stop or lessen the harshness of morning sickness. °Eating and drinking °· Eat dry toast or crackers before getting out of bed. °· Eat 5 or 6 small meals a day. °· Eat dry and bland foods like rice and baked potatoes. °· Do not eat greasy, fatty, or spicy foods. °· Have someone cook for you if the smell of food causes you to feel sick or throw up. °· If you feel sick to your stomach after taking prenatal vitamins, take them at night or with a snack. °· Eat protein when you need a snack. Nuts, yogurt, and cheese are good choices. °· Drink fluids throughout the day. °· Try ginger ale made with real ginger, ginger tea made from fresh grated ginger, or ginger candies. °General instructions °· Do not use any products that have nicotine or tobacco in them, such as cigarettes and e-cigarettes. If you need help quitting, ask your doctor. °· Use an air purifier to keep the air in your house free of smells. °· Get lots of fresh air. °· Try to avoid smells that make you feel sick. °· Try: °? Wearing a bracelet that is used for seasickness (acupressure wristband). °? Going to a doctor who puts thin needles into certain body points (acupuncture) to improve how you feel. °Contact a doctor if: °· You need medicine to feel better. °· You feel dizzy or light-headed. °· You are losing weight. °Get help right away if: °· You feel very sick to your  stomach and cannot stop throwing up. °· You pass out (faint). °· You have very bad pain in your belly. °Summary °· Morning sickness is when you feel sick to your stomach (nauseous) during pregnancy. °· You may feel sick in the morning, but you can feel this way at any time of day. °· Making some changes to what you eat may help your symptoms go away. °This information is not intended to replace advice given to you by your health care provider. Make sure you discuss any questions you have with your health care provider. °Document Revised: 05/13/2017 Document Reviewed: 07/01/2016 °Elsevier Patient Education © 2020 [employer] ° °

## 2020-01-25 NOTE — Discharge Summary (Signed)
Physician Discharge Summary   Patient ID: Megan Bentley [id number] 34 y.o. 03/02/1986  Admit date: 01/25/2020  Discharge date and time: No discharge date for patient encounter.   Admitting Physician: Natale Milch, MD   Discharge Physician: Adelene Idler MD  Admission Diagnoses: Nausea and vomiting during pregnancy [O21.9]  Discharge Diagnoses: same as above  Admission Condition: fair  Discharged Condition: good  Indication for Admission: Nausea, vomiting in pregnancy  Hospital Course: Megan Bentley presented to labor and delivery with complaints of nausea vomiting diarrhea and contractions this morning.  Evaluation showed that she was not laboring and cervix was closed.  Diarrhea improved during her hospital stay with only 1 loose stool during her admission.  Nausea and vomiting improved with Zofran and IV fluids.  Electrolytes were replaced.  Patient was able to be discharged in the evening after she tolerated an oral diet and report that she is feeling much better.  Discussed reasons to return and encourage patient to follow-up with her physician next week.  Consults: None  Significant Diagnostic Studies: labs: See EPIC  Treatments: IV hydration  Discharge Exam: BP (!) 96/47   Pulse 66   Temp 98.8 F (37.1 C) (Oral)   Resp 18   Ht 5\' 4"  (1.626 m)   Wt 50.3 kg   LMP 08/06/2019   SpO2 100%   BMI 19.05 kg/m   General Appearance:    Alert, cooperative, no distress, appears stated age  Head:    Normocephalic, without obvious abnormality, atraumatic  Eyes:    PERRL, conjunctiva/corneas clear, EOM's intact, fundi    benign, both eyes  Ears:    Normal TM's and external ear canals, both ears  Nose:   Nares normal, septum midline, mucosa normal, no drainage    or sinus tenderness  Throat:   Lips, mucosa, and tongue normal; teeth and gums normal  Neck:   Supple, symmetrical, trachea midline, no adenopathy;    thyroid:  no enlargement/tenderness/nodules; no carotid    bruit or JVD  Back:     Symmetric, no curvature, ROM normal, no CVA tenderness  Lungs:     Clear to auscultation bilaterally, respirations unlabored  Chest Wall:    No tenderness or deformity   Heart:    Regular rate and rhythm, S1 and S2 normal, no murmur, rub   or gallop  Breast Exam:    No tenderness, masses, or nipple abnormality  Abdomen:     Soft, non-tender, bowel sounds active all four quadrants,    no masses, no organomegaly  Genitalia:    Normal female without lesion, discharge or tenderness  Rectal:    Normal tone, normal prostate, no masses or tenderness;   guaiac negative stool  Extremities:   Extremities normal, atraumatic, no cyanosis or edema  Pulses:   2+ and symmetric all extremities  Skin:   Skin color, texture, turgor normal, no rashes or lesions  Lymph nodes:   Cervical, supraclavicular, and axillary nodes normal  Neurologic:   CNII-XII intact, normal strength, sensation and reflexes    throughout    Disposition: Discharge disposition: 01-Home or Self Care       Patient Instructions:  Allergies as of 01/25/2020      Reactions   Sulfa Antibiotics Anaphylaxis      Medication List    TAKE these medications   clonazePAM 0.5 MG tablet Commonly known as: KLONOPIN Take 0.25 mg by mouth 2 (two) times daily as needed for anxiety.   FLUoxetine 10 MG tablet  Commonly known as: PROZAC Take 10 mg by mouth daily.   ondansetron 4 MG tablet Commonly known as: ZOFRAN Take 4 mg by mouth every 8 (eight) hours as needed for nausea or vomiting.      Activity: activity as tolerated Diet: regular diet Wound Care: none needed  Follow-up with [facility]  in 1 week.  Signed: Natale Milch 01/25/2020 9:04 PM

## 2020-01-25 NOTE — Progress Notes (Signed)
OB History & Physical   History of Present Illness:  Chief Complaint:  Nausea, vomiting, diarrhea, and abdominal pain since 0300 HPI:  Megan Bentley is a 34 y.o. G84P1031 female with EDC=11/29/2021at [redacted]w[redacted]d dated by a 7wk4d ultrasound.  Her pregnancy has been complicated by anxiety (on Prozac and clonazepam prn) and nausea and vomiting of pregnancy.  She presents to L&D with complaints of nausea, vomiting, diarrhea and abdominal pain since 0300 this AM. Last ate pork chops last night. No one else in family with similar symptoms. Has tried to keep down water this AM. No blood in vomitus or stool. Has had diarrhea at least 5 times this morning and once since she arrived at the hospital. Has vomited "too many times to count".  Having intermittent lower abdominal pains with contractions. No vaginal bleeding or leakage of fluid.  No dysuria, but "urine smells bad". Ran out of Zofran yesterday, has not picked up refill.  Medical history also remarkable for arthritis, Reynolds syndrome, migraines and kidney stones. Stopped smoking marijuana and tobacco with pregnancy.   Prenatal care site: Prenatal care at [facility].       Maternal Medical History:   Past Medical History:  Diagnosis Date  . Anxiety   . Arthritis   . Kidney stone   . Migraines   . Renal disorder   . Reynolds syndrome [facility])     Past Surgical History:  Procedure Laterality Date  . CYSTOSCOPY W/ RETROGRADES Bilateral 08/06/2015   Procedure: CYSTOSCOPY WITH RETROGRADE PYELOGRAM;  Surgeon: [name], MD;  Location: [facility] ORS;  Service: Urology;  Laterality: Bilateral;  . CYSTOSCOPY/URETEROSCOPY/HOLMIUM LASER/STENT PLACEMENT Left 08/06/2015   Procedure: CYSTOSCOPY/URETEROSCOPY/HOLMIUM LASER/STENT PLACEMENT;  Surgeon: [name], MD;  Location: [facility] ORS;  Service: Urology;  Laterality: Left;  . NO PAST SURGERIES    . none      Allergies  Allergen Reactions  . Sulfa Antibiotics Anaphylaxis    Prior to Admission  medications   Medication Sig Start Date End Date Taking? Authorizing Provider  clonazePAM (KLONOPIN) 0.5 MG tablet Take 0.25 mg by mouth 2 (two) times daily as needed for anxiety.   Yes [provider]  FLUoxetine (PROZAC) 10 MG tablet Take 10 mg by mouth daily.   Yes [provider]  ondansetron (ZOFRAN) 4 MG tablet Take 4 mg by mouth every 8 (eight) hours as needed for nausea or vomiting.   Yes [provider]          Social History: She  reports that she quit smoking about 7 months ago. Her smoking use included cigarettes. She has a 3.30 pack-year smoking history. She has never used smokeless tobacco. She reports previous drug use. Drug: Marijuana. She reports that she does not drink alcohol.  Family History: family history includes Kidney Stones in an other family member; Multiple sclerosis in an other family member; Sjogren's syndrome in an other family member.   Review of Systems: Negative x 10 systems reviewed except as noted in the HPI.      Physical Exam:  Vital Signs: BP (!) 109/59   Pulse 63   Temp (!) 97.5 F (36.4 C) (Oral)   Resp 18   LMP 08/06/2019   SpO2 100%  General: petite gravid female sitting up vomiting initially, not communicative HEENT: normocephalic, atraumatic Heart: regular rate & rhythm.  No murmurs/rubs/gallops Lungs: clear to auscultation bilaterally Abdomen: soft, gravid, tender with contractions which were palpable, bowel sounds hypoactive Pelvic:   Extremities: non-tender, symmetric, no edema bilaterally.  Neurologic: Alert & oriented x 3.    Pertinent Results:  Prenatal Labs: Blood type/Rh O positive  Antibody screen negative  Rubella nonimmune  RPR negative  HBsAg negative  HIV negative  GC negative  Chlamydia negative  Genetic screening Negative for SMA and CF  1 hour GTT   3 hour GTT   GBS    Baseline FHR: 150s  Toco: contractions every 2-4 min apart  Assessment:  Megan Bentley is a 33 y.o.  G67P1031 female at [redacted]w[redacted]d with nausea, vomiting, diarrhea and abdominal pain (contractions) R/O Covid Acute gastroenteritis R/O pancreatitis R/O gallbladder disease Contractions probably due to diarrhea and dehydration Plan:  1. Admit to Labor & Delivery for observation 2. CBC, CMP, lipase, UA, Covid test 3. LR bolus 1000 ml then 150/hr   4. IV Zofran 5. Pepcid 6. Clear liquids when nausea and vomiting subside 7. Monitor contractions-check cervix when able 8. Intermittent fetal heart tones  Farrel Conners  01/25/2020 8:30 AM

## 2020-01-25 NOTE — Progress Notes (Addendum)
L&D progress Note   S: Still nauseous after Zofran 4 mgm IV and Phenergan 12.5 mgm IV. Vomited once after Zofran and twice after phenergan IV. Had one stool since she arrived on L&D. Has had some dysuria. Contractions are less frequent and less intense. Pain in abdomen associated with contractions. Has a frontal headache currently  O: BP (!) 91/48 (BP Location: Right Arm)   Pulse 67   Temp 98.4 F (36.9 C) (Oral)   Resp 19   Ht 5\' 4"  (1.626 m)   Wt 50.3 kg   LMP 08/06/2019   SpO2 100%   BMI 19.05 kg/m    General: very restless in bed, but not writhing with pain as she was. She is speaking a little more clearly (does mumble).   Abdomen: soft, non distended and non tender  upper abdomen. Uterus non tender currently  FHR: 165  Toco: mostly uterine irritability now, lasting 20 sec.   Results for orders placed or performed during the hospital encounter of 01/25/20 (from the past 24 hour(s))  Comprehensive metabolic panel     Status: Abnormal   Collection Time: 01/25/20  8:54 AM  Result Value Ref Range   Sodium 136 135 - 145 mmol/L   Potassium 3.3 (L) 3.5 - 5.1 mmol/L   Chloride 104 98 - 111 mmol/L   CO2 19 (L) 22 - 32 mmol/L   Glucose, Bld 109 (H) 70 - 99 mg/dL   BUN 9 6 - 20 mg/dL   Creatinine, Ser 01/27/20 0.44 - 1.00 mg/dL   Calcium 9.0 8.9 - 6.29 mg/dL   Total Protein 6.5 6.5 - 8.1 g/dL   Albumin 3.5 3.5 - 5.0 g/dL   AST 17 15 - 41 U/L   ALT 12 0 - 44 U/L   Alkaline Phosphatase 45 38 - 126 U/L   Total Bilirubin 0.9 0.3 - 1.2 mg/dL   GFR calc non Af Amer >60 >60 mL/min   GFR calc Af Amer >60 >60 mL/min   Anion gap 13 5 - 15  CBC     Status: Abnormal   Collection Time: 01/25/20  8:54 AM  Result Value Ref Range   WBC 15.1 (H) 4.0 - 10.5 K/uL   RBC 3.36 (L) 3.87 - 5.11 MIL/uL   Hemoglobin 11.3 (L) 12.0 - 15.0 g/dL   HCT 01/27/20 (L) 36 - 46 %   MCV 96.4 80.0 - 100.0 fL   MCH 33.6 26.0 - 34.0 pg   MCHC 34.9 30.0 - 36.0 g/dL   RDW 41.3 24.4 - 01.0 %   Platelets 243 150 -  400 K/uL   nRBC 0.0 0.0 - 0.2 %  Lipase, blood     Status: None   Collection Time: 01/25/20  8:54 AM  Result Value Ref Range   Lipase 32 11 - 51 U/L  SARS Coronavirus 2 by RT PCR (hospital order, performed in [facility] hospital lab) Nasopharyngeal Nasopharyngeal Swab     Status: None   Collection Time: 01/25/20  9:25 AM   Specimen: Nasopharyngeal Swab  Result Value Ref Range   SARS Coronavirus 2 NEGATIVE NEGATIVE  Urinalysis, Complete w Microscopic Urine, Clean Catch     Status: Abnormal   Collection Time: 01/25/20  9:26 AM  Result Value Ref Range   Color, Urine YELLOW (A) YELLOW   APPearance HAZY (A) CLEAR   Specific Gravity, Urine 1.014 1.005 - 1.030   pH 9.0 (H) 5.0 - 8.0   Glucose, UA NEGATIVE NEGATIVE mg/dL  Hgb urine dipstick NEGATIVE NEGATIVE   Bilirubin Urine NEGATIVE NEGATIVE   Ketones, ur 20 (A) NEGATIVE mg/dL   Protein, ur 30 (A) NEGATIVE mg/dL   Nitrite NEGATIVE NEGATIVE   Leukocytes,Ua NEGATIVE NEGATIVE   RBC / HPF 0-5 0 - 5 RBC/hpf   WBC, UA 0-5 0 - 5 WBC/hpf   Bacteria, UA RARE (A) NONE SEEN   Squamous Epithelial / LPF 0-5 0 - 5   Mucus PRESENT   Urine Drug Screen, Qualitative ([facility] only)     Status: Abnormal   Collection Time: 01/25/20  9:26 AM  Result Value Ref Range   Tricyclic, Ur Screen NONE DETECTED NONE DETECTED   Amphetamines, Ur Screen NONE DETECTED NONE DETECTED   MDMA (Ecstasy)Ur Screen NONE DETECTED NONE DETECTED   Cocaine Metabolite,Ur [place] NONE DETECTED NONE DETECTED   Opiate, Ur Screen NONE DETECTED NONE DETECTED   Phencyclidine (PCP) Ur S NONE DETECTED NONE DETECTED   Cannabinoid 50 Ng, Ur [place] POSITIVE (A) NONE DETECTED   Barbiturates, Ur Screen NONE DETECTED NONE DETECTED   Benzodiazepine, Ur Scrn NONE DETECTED NONE DETECTED   Methadone Scn, Ur NONE DETECTED NONE DETECTED   A: Acute gastroenteritis Contractions lessening with hydration Mild hypokalemia Marijuana use- Dysuria, but no evidence of UTI on UA  P: Repeat Zofran 4 mgm  IV Tylenol IV for headache Continue IV hydration Clear liquids when ready Plan on wet prep and cervical exam when feeling a little better.  Farrel Conners, CNM  1700 Addendum: Feeling a little better. Abdominal pain resolved. Still not able to keep down fluids or food. Vomited after trying clear liquids and a banana. FHTs 160s, 170s Pelvic exam: External/BUS: negative for lesions or inflammation Vagina: mucoid discharge Cervix L/T/C and OOP Wet prep negative for hyphae, Trich, clue cells  Plan: monitor baby until baseline WNL Discontinue toco IV KCL Talked with patient about staying overnight for continued hydration until able to tolerate food/fluids.  Farrel Conners, CNM

## 2020-01-28 DIAGNOSIS — O26892 Other specified pregnancy related conditions, second trimester: Secondary | ICD-10-CM | POA: Diagnosis not present

## 2020-01-28 DIAGNOSIS — R112 Nausea with vomiting, unspecified: Secondary | ICD-10-CM | POA: Diagnosis not present

## 2020-01-28 DIAGNOSIS — R109 Unspecified abdominal pain: Secondary | ICD-10-CM | POA: Diagnosis not present

## 2020-01-28 DIAGNOSIS — R748 Abnormal levels of other serum enzymes: Secondary | ICD-10-CM | POA: Diagnosis not present

## 2020-02-19 DIAGNOSIS — O99333 Smoking (tobacco) complicating pregnancy, third trimester: Secondary | ICD-10-CM | POA: Diagnosis not present

## 2020-02-19 DIAGNOSIS — R519 Headache, unspecified: Secondary | ICD-10-CM | POA: Diagnosis not present

## 2020-02-19 DIAGNOSIS — Z283 Underimmunization status: Secondary | ICD-10-CM | POA: Diagnosis not present

## 2020-02-19 DIAGNOSIS — Z3689 Encounter for other specified antenatal screening: Secondary | ICD-10-CM | POA: Diagnosis not present

## 2020-02-19 DIAGNOSIS — Z3A28 28 weeks gestation of pregnancy: Secondary | ICD-10-CM | POA: Diagnosis not present

## 2020-02-19 DIAGNOSIS — Z87440 Personal history of urinary (tract) infections: Secondary | ICD-10-CM | POA: Diagnosis not present

## 2020-02-19 DIAGNOSIS — Z87891 Personal history of nicotine dependence: Secondary | ICD-10-CM | POA: Diagnosis not present

## 2020-02-19 DIAGNOSIS — O99343 Other mental disorders complicating pregnancy, third trimester: Secondary | ICD-10-CM | POA: Diagnosis not present

## 2020-02-19 DIAGNOSIS — Z79899 Other long term (current) drug therapy: Secondary | ICD-10-CM | POA: Diagnosis not present

## 2020-02-19 DIAGNOSIS — O26893 Other specified pregnancy related conditions, third trimester: Secondary | ICD-10-CM | POA: Diagnosis not present

## 2020-02-19 DIAGNOSIS — Z23 Encounter for immunization: Secondary | ICD-10-CM | POA: Diagnosis not present

## 2020-02-19 DIAGNOSIS — F419 Anxiety disorder, unspecified: Secondary | ICD-10-CM | POA: Diagnosis not present

## 2020-02-21 ENCOUNTER — Telehealth: Payer: Self-pay | Admitting: Nurse Practitioner

## 2020-02-21 NOTE — Telephone Encounter (Signed)
Scheduled for 9/22

## 2020-02-21 NOTE — Telephone Encounter (Signed)
Copied from CRM [id number]. Topic: Quick Communication - See Telephone Encounter >> Feb 21, 2020 12:45 PM Aretta Nip wrote: CRM for notification. See Telephone encounter for: 02/21/20.This pt called at lunch, states her mother is Marlena Clipper and wants her to be a pt of [name]. Told pt [name] not accepting, pt states that her mother said she would if she knew she was her mom.  Fu if appt can be made [phone]

## 2020-02-21 NOTE — Telephone Encounter (Signed)
Will accept as new patient to practice.  May schedule visit.

## 2020-03-05 ENCOUNTER — Ambulatory Visit: Payer: Self-pay | Admitting: Nurse Practitioner

## 2020-03-10 DIAGNOSIS — O212 Late vomiting of pregnancy: Secondary | ICD-10-CM | POA: Diagnosis not present

## 2020-03-10 DIAGNOSIS — Z3A31 31 weeks gestation of pregnancy: Secondary | ICD-10-CM | POA: Diagnosis not present

## 2020-03-10 DIAGNOSIS — O99333 Smoking (tobacco) complicating pregnancy, third trimester: Secondary | ICD-10-CM | POA: Diagnosis not present

## 2020-03-10 DIAGNOSIS — F17210 Nicotine dependence, cigarettes, uncomplicated: Secondary | ICD-10-CM | POA: Diagnosis not present

## 2020-03-16 ENCOUNTER — Encounter: Payer: Self-pay | Admitting: Nurse Practitioner

## 2020-03-18 NOTE — Telephone Encounter (Signed)
Pt was schedule for tomorrow as a new patient but her children are in quarantine and are waiting for results.  She will need to resd. For a later date.  The syste is not letting me resch her    CB#  [phone]

## 2020-03-19 ENCOUNTER — Ambulatory Visit: Payer: Medicaid Other | Admitting: Nurse Practitioner

## 2020-04-01 DIAGNOSIS — Z113 Encounter for screening for infections with a predominantly sexual mode of transmission: Secondary | ICD-10-CM | POA: Diagnosis not present

## 2020-04-01 DIAGNOSIS — L299 Pruritus, unspecified: Secondary | ICD-10-CM | POA: Diagnosis not present

## 2020-04-01 DIAGNOSIS — O26893 Other specified pregnancy related conditions, third trimester: Secondary | ICD-10-CM | POA: Diagnosis not present

## 2020-04-01 DIAGNOSIS — O99333 Smoking (tobacco) complicating pregnancy, third trimester: Secondary | ICD-10-CM | POA: Diagnosis not present

## 2020-04-01 DIAGNOSIS — F419 Anxiety disorder, unspecified: Secondary | ICD-10-CM | POA: Diagnosis not present

## 2020-04-01 DIAGNOSIS — F17210 Nicotine dependence, cigarettes, uncomplicated: Secondary | ICD-10-CM | POA: Diagnosis not present

## 2020-04-01 DIAGNOSIS — Z3A34 34 weeks gestation of pregnancy: Secondary | ICD-10-CM | POA: Diagnosis not present

## 2020-04-01 DIAGNOSIS — Z79899 Other long term (current) drug therapy: Secondary | ICD-10-CM | POA: Diagnosis not present

## 2020-04-01 DIAGNOSIS — O99343 Other mental disorders complicating pregnancy, third trimester: Secondary | ICD-10-CM | POA: Diagnosis not present

## 2020-04-01 DIAGNOSIS — M545 Low back pain, unspecified: Secondary | ICD-10-CM | POA: Diagnosis not present

## 2020-04-01 DIAGNOSIS — O4703 False labor before 37 completed weeks of gestation, third trimester: Secondary | ICD-10-CM | POA: Diagnosis not present

## 2020-04-01 DIAGNOSIS — O99713 Diseases of the skin and subcutaneous tissue complicating pregnancy, third trimester: Secondary | ICD-10-CM | POA: Diagnosis not present

## 2020-04-10 ENCOUNTER — Ambulatory Visit (INDEPENDENT_AMBULATORY_CARE_PROVIDER_SITE_OTHER): Payer: Medicaid Other | Admitting: Nurse Practitioner

## 2020-04-10 ENCOUNTER — Other Ambulatory Visit: Payer: Self-pay

## 2020-04-10 ENCOUNTER — Encounter: Payer: Self-pay | Admitting: Nurse Practitioner

## 2020-04-10 ENCOUNTER — Telehealth: Payer: Self-pay

## 2020-04-10 VITALS — BP 104/68 | HR 105 | Temp 99.0°F | Resp 16 | Ht 64.0 in | Wt 122.8 lb

## 2020-04-10 DIAGNOSIS — Z7689 Persons encountering health services in other specified circumstances: Secondary | ICD-10-CM | POA: Diagnosis not present

## 2020-04-10 DIAGNOSIS — Z87898 Personal history of other specified conditions: Secondary | ICD-10-CM | POA: Insufficient documentation

## 2020-04-10 DIAGNOSIS — Z87891 Personal history of nicotine dependence: Secondary | ICD-10-CM

## 2020-04-10 DIAGNOSIS — N6312 Unspecified lump in the right breast, upper inner quadrant: Secondary | ICD-10-CM | POA: Diagnosis not present

## 2020-04-10 DIAGNOSIS — N6310 Unspecified lump in the right breast, unspecified quadrant: Secondary | ICD-10-CM | POA: Insufficient documentation

## 2020-04-10 DIAGNOSIS — F411 Generalized anxiety disorder: Secondary | ICD-10-CM | POA: Diagnosis not present

## 2020-04-10 NOTE — Telephone Encounter (Signed)
Routing to provider to advise.  
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
## 2020-04-10 NOTE — Telephone Encounter (Signed)
Please let her know it is office rule that we can not prescribe controlled substances on initial visits unfortunately.  She can discuss with her provider tomorrow to see if they can offer help with this, would be good for her OB to advise on this due to her pregnancy, want to ensure they are okay with this as well.  If ongoing she can return to see me for follow-up.  I have placed referrals to psychiatry and therapy for her as well, if she does not hear from them over next week please let me know.

## 2020-04-10 NOTE — Telephone Encounter (Signed)
Copied from CRM [id number]. Topic: General - Inquiry >> 05/01/2020 12:26 PM Daphine Deutscher D wrote: Reason for CRM  pt called saying she was in the office this morning with [name] and forgot to ask her if she could get a Rx for Klonipin for emergency panic attacks.  She use t take it on emergencies and her grandfather suddenly passed away. >> May 01, 2020 12:36 PM Daphine Deutscher D wrote: Pt called saying she was in the office with [name] this morning and forgot to ask her if she could get a Rx for klonopin for when she has panic attacks.  She use to take them  Once in while.  [facility] [address]  .

## 2020-04-10 NOTE — Assessment & Plan Note (Signed)
Noted on exam today to right breast at 12 o'clock position.  Will order U/S of area to further assess.

## 2020-04-10 NOTE — Patient Instructions (Signed)

## 2020-04-10 NOTE — Telephone Encounter (Signed)
Tried calling patient, no answer and no VM set up. Will try to call again later.  
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
## 2020-04-10 NOTE — Assessment & Plan Note (Signed)
Quit 2-3 months ago, recommend continued cessation.

## 2020-04-10 NOTE — Assessment & Plan Note (Addendum)
With underlying anxiety disorder, suspect some PTSD.  Will place referral for therapy, she would benefit from CBT.

## 2020-04-11 NOTE — Telephone Encounter (Signed)
Patient notified and verbalized understanding of [name]'s message.  

## 2020-04-11 NOTE — Telephone Encounter (Signed)
Pt called back.  Would like a call back.

## 2020-04-14 ENCOUNTER — Other Ambulatory Visit: Payer: Medicaid Other

## 2020-04-17 ENCOUNTER — Telehealth: Payer: Self-pay

## 2020-04-17 NOTE — Telephone Encounter (Signed)
Copied from CRM [id number]. Topic: Referral - Status >> Apr 17, 2020 11:44 AM Wyonia Hough E wrote: Reason for CRM: [facility] received a referral but it had no insurance info/ please fax info over asap to [phone]

## 2020-04-17 NOTE — Telephone Encounter (Signed)
Insurance Info was in referral. Will send back over to make sure. Thank you!

## 2020-04-21 ENCOUNTER — Ambulatory Visit
Admission: RE | Admit: 2020-04-21 | Discharge: 2020-04-21 | Disposition: A | Discharge status: Home / Self Care | Payer: Medicaid Other | Source: Ambulatory Visit | Attending: Nurse Practitioner | Admitting: Nurse Practitioner

## 2020-04-21 ENCOUNTER — Other Ambulatory Visit: Payer: Self-pay

## 2020-04-21 DIAGNOSIS — N6310 Unspecified lump in the right breast, unspecified quadrant: Secondary | ICD-10-CM

## 2020-04-21 DIAGNOSIS — N6315 Unspecified lump in the right breast, overlapping quadrants: Secondary | ICD-10-CM | POA: Diagnosis not present

## 2020-04-21 DIAGNOSIS — O9229 Other disorders of breast associated with pregnancy and the puerperium: Secondary | ICD-10-CM | POA: Insufficient documentation

## 2020-04-21 NOTE — Progress Notes (Signed)
Contacted via MyChart  Good morning Megan Bentley, your breast ultrasound returned showing some denser breast tissue at the area of concern, but this is normal fibroglandular tissue.  No cysts or masses noted.  Recommendation is to start screening mammograms at age 34, unless area becomes more concerning.  Any questions? Keep being awesome!!  Thank you for allowing me to participate in your care. Kindest regards, Leticia Mcdiarmid

## 2020-04-25 DIAGNOSIS — F411 Generalized anxiety disorder: Secondary | ICD-10-CM | POA: Diagnosis not present

## 2020-05-02 ENCOUNTER — Telehealth: Payer: Self-pay | Admitting: Nurse Practitioner

## 2020-05-02 ENCOUNTER — Other Ambulatory Visit: Payer: Self-pay | Admitting: Nurse Practitioner

## 2020-05-02 DIAGNOSIS — O368130 Decreased fetal movements, third trimester, not applicable or unspecified: Secondary | ICD-10-CM | POA: Diagnosis not present

## 2020-05-02 DIAGNOSIS — O0993 Supervision of high risk pregnancy, unspecified, third trimester: Secondary | ICD-10-CM | POA: Diagnosis not present

## 2020-05-02 DIAGNOSIS — Z3A38 38 weeks gestation of pregnancy: Secondary | ICD-10-CM

## 2020-05-02 DIAGNOSIS — F411 Generalized anxiety disorder: Secondary | ICD-10-CM | POA: Diagnosis not present

## 2020-05-02 NOTE — Telephone Encounter (Signed)
Patient is calling to request a referral to [facility]. Patient is 9 months pregnant. And is needed the referral sent soon.  Patient is due May 12, 2020 CB- [phone]

## 2020-05-02 NOTE — Telephone Encounter (Signed)
Called pt to advise referral has been placed no answer vm not set up

## 2020-05-02 NOTE — Telephone Encounter (Signed)
Referral placed per request.

## 2020-05-05 DIAGNOSIS — O1415 Severe pre-eclampsia, complicating the puerperium: Secondary | ICD-10-CM | POA: Diagnosis not present

## 2020-05-05 DIAGNOSIS — Z87442 Personal history of urinary calculi: Secondary | ICD-10-CM | POA: Diagnosis not present

## 2020-05-05 DIAGNOSIS — F419 Anxiety disorder, unspecified: Secondary | ICD-10-CM | POA: Diagnosis not present

## 2020-05-05 DIAGNOSIS — Z79899 Other long term (current) drug therapy: Secondary | ICD-10-CM | POA: Diagnosis not present

## 2020-05-05 DIAGNOSIS — O99344 Other mental disorders complicating childbirth: Secondary | ICD-10-CM | POA: Diagnosis not present

## 2020-05-05 DIAGNOSIS — F17210 Nicotine dependence, cigarettes, uncomplicated: Secondary | ICD-10-CM | POA: Diagnosis not present

## 2020-05-05 DIAGNOSIS — Z20822 Contact with and (suspected) exposure to covid-19: Secondary | ICD-10-CM | POA: Diagnosis not present

## 2020-05-05 DIAGNOSIS — O99334 Smoking (tobacco) complicating childbirth: Secondary | ICD-10-CM | POA: Diagnosis not present

## 2020-05-05 DIAGNOSIS — Z3A39 39 weeks gestation of pregnancy: Secondary | ICD-10-CM | POA: Diagnosis not present

## 2020-05-05 DIAGNOSIS — Z882 Allergy status to sulfonamides status: Secondary | ICD-10-CM | POA: Diagnosis not present

## 2020-05-05 DIAGNOSIS — Z3483 Encounter for supervision of other normal pregnancy, third trimester: Secondary | ICD-10-CM | POA: Diagnosis not present

## 2020-05-05 DIAGNOSIS — O99324 Drug use complicating childbirth: Secondary | ICD-10-CM | POA: Diagnosis not present

## 2020-05-05 DIAGNOSIS — Z87892 Personal history of anaphylaxis: Secondary | ICD-10-CM | POA: Diagnosis not present

## 2020-05-05 NOTE — Telephone Encounter (Signed)
Called pt back she said that [facility] called her she is scheduled to be induced tonight. She still wants to use the referral because she wants to have [facility] as her obgyn

## 2020-05-05 NOTE — Telephone Encounter (Signed)
Noted, thank you

## 2020-05-06 DIAGNOSIS — R079 Chest pain, unspecified: Secondary | ICD-10-CM | POA: Diagnosis not present

## 2020-05-06 DIAGNOSIS — Z3490 Encounter for supervision of normal pregnancy, unspecified, unspecified trimester: Secondary | ICD-10-CM | POA: Diagnosis not present

## 2020-05-06 DIAGNOSIS — Z3A39 39 weeks gestation of pregnancy: Secondary | ICD-10-CM | POA: Diagnosis not present

## 2020-05-13 ENCOUNTER — Telehealth (INDEPENDENT_AMBULATORY_CARE_PROVIDER_SITE_OTHER): Payer: Medicaid Other | Admitting: Family Medicine

## 2020-05-13 ENCOUNTER — Encounter: Payer: Self-pay | Admitting: Family Medicine

## 2020-05-13 DIAGNOSIS — M549 Dorsalgia, unspecified: Secondary | ICD-10-CM

## 2020-05-13 DIAGNOSIS — F411 Generalized anxiety disorder: Secondary | ICD-10-CM

## 2020-05-13 MED ORDER — BUSPIRONE HCL 7.5 MG PO TABS: 7.5000 mg | ORAL_TABLET | Freq: Two times a day (BID) | ORAL | 0 refills | Status: DC

## 2020-05-13 MED ORDER — CLONAZEPAM 0.5 MG PO TABS: 0.2500 mg | ORAL_TABLET | Freq: Two times a day (BID) | ORAL | 0 refills | Status: DC | PRN

## 2020-05-13 MED ORDER — LIDOCAINE 4 % EX PTCH: 1.0000 | MEDICATED_PATCH | Freq: Two times a day (BID) | CUTANEOUS | 1 refills | Status: DC | PRN
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
## 2020-05-13 NOTE — Assessment & Plan Note (Signed)
Chronic and worsened by postpartum status and grandfather's recent death. Previously intolerant to many SSRIs. Will trial buspar. Will give short course of low-dose clonazepam until able to stabilize. Continue with counseling. F/u in 2 weeks.

## 2020-05-13 NOTE — Assessment & Plan Note (Signed)
2/2 epidural during labor. Rx for lidoderm patches sent.

## 2020-05-13 NOTE — Patient Instructions (Signed)
It was great to see you!  Our plans for today:  - Use the lidoderm patches for your back pain. If you start to have vision changes, headaches not controlled with ibuprofen, tell your OB doctor. - Take the buspirone twice daily, every day. You should hopefully start to feel a difference in the next few days. Let us know if you don't tolerate this. - Only take the clonazepam as needed and when your regular coping mechanisms aren't helping to calm you down.  - See below for some helpful apps.  - Follow up in 2 weeks.   I'm glad you were able to get back in to see your counselor!  Take care and seek immediate care sooner if you develop any concerns.   Dr. Linwood Dibbles   Mental Health Apps and Websites Here are a few free apps meant to help you to help yourself.  To find, try searching on the internet to see if the app is offered on Apple/Android devices. If your first choice doesn't come up on your device, the good news is that there are many choices! Play around with different apps to see which ones are helpful to you . Calm This is an app meant to help increase calm feelings. Includes info, strategies, and tools for tracking your feelings.   Calm Harm  This app is meant to help with self-harm. Provides many 5-minute or 15-min coping strategies for doing instead of hurting yourself.    Healthy Minds Health Minds is a problem-solving tool to help deal with emotions and cope with stress you encounter wherever you are.    MindShift This app can help people cope with anxiety. Rather than trying to avoid anxiety, you can make an important shift and face it.    MY3  MY3 features a support system, safety plan and resources with the goal of offering a tool to use in a time of need.    My Life My Voice  This mood journal offers a simple solution for tracking your thoughts, feelings and moods. Animated emoticons can help identify your mood.   Relax Melodies Designed to help with sleep, on this app  you can mix sounds and meditations for relaxation.    Smiling Mind Smiling Mind is meditation made easy: it's a simple tool that helps put a smile on your mind.    Stop, Breathe & Think  A friendly, simple guide for people through meditations for mindfulness and compassion.  Stop, Breathe and Think Kids Enter your current feelings and choose a "mission" to help you cope. Offers videos for certain moods instead of just sound recordings.     The [employer] The [employer] ([employer]) contains simple tools to help patients with coping, relaxation, distraction, and positive thinking.

## 2020-05-14 NOTE — Telephone Encounter (Signed)
Patient has messaged twice today to both providers. Please advise.

## 2020-05-14 NOTE — Telephone Encounter (Signed)
Patient has messaged twice today to both providers. Please advise.  °

## 2020-05-15 ENCOUNTER — Other Ambulatory Visit: Payer: Self-pay | Admitting: Family Medicine

## 2020-05-15 MED ORDER — CYCLOBENZAPRINE HCL 5 MG PO TABS: 5.0000 mg | ORAL_TABLET | Freq: Three times a day (TID) | ORAL | 0 refills | Status: DC | PRN

## 2020-05-19 ENCOUNTER — Telehealth: Payer: Self-pay

## 2020-05-19 NOTE — Telephone Encounter (Signed)
Called pt to r/s appt, no answer, vm not set up   Copied from CRM [id number]. Topic: General - Other >> May 19, 2020 12:24 PM Gwenlyn Fudge wrote: Reason for CRM: Pt called and is requesting to reschedule her appt with Dr. Linwood Dibbles until Thursday. Please advise.

## 2020-05-20 ENCOUNTER — Ambulatory Visit: Payer: Medicaid Other | Admitting: Family Medicine

## 2020-05-21 NOTE — Progress Notes (Signed)
SUBJECTIVE:   CHIEF COMPLAINT / HPI:   Past Medical History:  Diagnosis Date  . Allergy   . Anxiety   . Arthritis   . Depression   . Kidney stone   . Migraines   . Renal disorder   . Reynolds syndrome [facility])    Back pain - better since using muscle relaxers. Not having to use anymore.   Anxiety - persistent and worsened since delivery. Not taking buspirone as feel it made her headache worse. Taking klonopin sparingly. Has appt with [facility] therapist today at 11:30am.  HEADACHE  Headache started immediately postpartum. Had preE with severe features. Has not had to start antihypertensives but notes her BP fluctuates.  Pain is constant, pounding, throbbing.  Keeps from doing: able to feed and take care of daughter but not much else Location: behind eyes, frontal. Non radiating.  Medications tried: tylenol, ibuprofen. Not helping. Head trauma: no Sudden onset: immediately postpartum Previous similar headaches: no. H/o migraines but feels different Taking blood thinners: no History of cancer: no Nose congestion stuffiness:  no Nausea vomiting: no Photophobia: yes Noise sensitivity: yes Double vision or loss of vision: sometimes sees floaters Fever: no Neck Stiffness: no Trouble walking or speaking: dizziness with standing Started iron supplements 7 days ago. Was not on iron during pregnancy.  DIZZINESS - 148/98 highest, 90s/60s lowest. Duration: since coming home from hospital. few weeks Description of symptoms: lightheaded Duration of episode: minutes Dizziness frequency: no history of the same Provoking factors: movement, standing Aggravating factors:  movement, standing, high BP Triggered by rolling over in bed: no Triggered by bending over: no Aggravated by head movement: no Aggravated by exertion, coughing, loud noises: unsure, "stresses her out" Recent head injury: no Recent or current viral symptoms: no History of vasovagal episodes: no Nausea:  no Vomiting: no Tinnitus: no Hearing loss: no Headache: yes Photophobia/phonophobia: with headache Unsteady gait: yes Diplopia, dysarthria, dysphagia or weakness: no Related to exertion: is not exerting Pallor: yes Diaphoresis: yes Dyspnea: no Chest pain: sometimes, worse at night  URINARY SYMPTOMS Hard to urinate, feels like straining. Had foley in during delivery. 2 days duration. Dysuria: no Urinary frequency: no Urgency: yes Small volume voids: yes Urinary incontinence: no Foul odor: yes Hematuria: yes Abdominal pain: no Back pain: no Suprapubic pain/pressure: no Flank pain: no Fever:  no Vomiting: no Relief with cranberry juice: no Relief with pyridium: no, hasn't tried Status: better/worse/stable Previous urinary tract infection: yes Recurrent urinary tract infection: no Sexual activity: Not currently sexually active, postpartum Treatments attempted: cranberry and increasing fluids    OBJECTIVE:   BP 92/70   Pulse (!) 121   Temp 98.5 F (36.9 C)   SpO2 98%   Gen: well appearing, in NAD Cardiac: tachycardic, Regular rhythm Lungs: CTAB Abd: soft, nonTTP, nondistended, firm fundus Ext: WWP, no edema MSK: Full ROM, strength 5/5 to U/LE bilaterally. Neuro: Alert and oriented, speech normal.  Optic field normal. PERRL, Extraocular movements intact.  Intact symmetric sensation to light touch of face and extremities bilaterally.  Hearing grossly intact bilaterally.  Tongue protrudes normally with no deviation.  Shoulder shrug, smile symmetric.    ASSESSMENT/PLAN:   Anxiety disorder Likely contributing to constellation of symptoms and exacerbated by recent traumatic postpartum experience with preE with severe features. Has appt today with therapist, believe consistent use of antidepressant would be highly beneficial, defer to psych.  Back pain Resolved.   Dizziness Orthostatic measurements today negative but does have postural hypotensive symptoms. Likely  exacerbated by postpartum anemia and inactivity. Will obtain labs. Neuro exam wnl. Postpartum anxiety may also be contributing, has psych appt today. Recommend copious oral hydration and taking time when changing positions. Referral placed to different OB/GYN per patient preference due to location for postpartum preE monitoring.   Headache Likely multifactorial with postpartum anemia, hypotension and dehydration as well as exacerbated anxiety contributing. Neuro exam wnl today. BP low normal so doubt worsened preE. Referral placed to OB/GYN for postpartum preE monitoring. Has psych appt today. Continue OTC pain relief with tylenol/ibuprofen, increase oral hydration. F/u if no better.  Lower urinary tract symptoms Mainly decrease in urine output with straining. UA with casts and sediment indicating slight dehydration. Does have few bacteria and WBC, will send for culture prior to treating but suspect dirty catch rather than true infection. Recommend increased oral hydration, will notify of culture results and treat as indicated.     Megan Laroche, DO

## 2020-05-22 ENCOUNTER — Other Ambulatory Visit: Payer: Self-pay

## 2020-05-22 ENCOUNTER — Ambulatory Visit (INDEPENDENT_AMBULATORY_CARE_PROVIDER_SITE_OTHER): Payer: Medicaid Other | Admitting: Family Medicine

## 2020-05-22 ENCOUNTER — Encounter: Payer: Self-pay | Admitting: Family Medicine

## 2020-05-22 VITALS — BP 92/70 | HR 121 | Temp 98.5°F

## 2020-05-22 DIAGNOSIS — R519 Headache, unspecified: Secondary | ICD-10-CM | POA: Diagnosis not present

## 2020-05-22 DIAGNOSIS — R39198 Other difficulties with micturition: Secondary | ICD-10-CM | POA: Diagnosis not present

## 2020-05-22 DIAGNOSIS — R399 Unspecified symptoms and signs involving the genitourinary system: Secondary | ICD-10-CM | POA: Insufficient documentation

## 2020-05-22 DIAGNOSIS — R8281 Pyuria: Secondary | ICD-10-CM

## 2020-05-22 DIAGNOSIS — O9081 Anemia of the puerperium: Secondary | ICD-10-CM | POA: Diagnosis not present

## 2020-05-22 DIAGNOSIS — F411 Generalized anxiety disorder: Secondary | ICD-10-CM | POA: Diagnosis not present

## 2020-05-22 DIAGNOSIS — O1493 Unspecified pre-eclampsia, third trimester: Secondary | ICD-10-CM | POA: Diagnosis not present

## 2020-05-22 DIAGNOSIS — F4310 Post-traumatic stress disorder, unspecified: Secondary | ICD-10-CM | POA: Diagnosis not present

## 2020-05-22 DIAGNOSIS — D649 Anemia, unspecified: Secondary | ICD-10-CM | POA: Diagnosis not present

## 2020-05-22 DIAGNOSIS — R42 Dizziness and giddiness: Secondary | ICD-10-CM | POA: Diagnosis not present

## 2020-05-22 DIAGNOSIS — M549 Dorsalgia, unspecified: Secondary | ICD-10-CM

## 2020-05-22 LAB — MICROSCOPIC EXAMINATION: RBC, Urine: >30 /hpf — AB (ref 0–2)

## 2020-05-22 LAB — URINALYSIS, ROUTINE W REFLEX MICROSCOPIC
Bilirubin, UA: NEGATIVE
Glucose, UA: NEGATIVE
Ketones, UA: NEGATIVE
Nitrite, UA: NEGATIVE
Specific Gravity, UA: <1.005 — ABNORMAL LOW (ref 1.005–1.030)
Urobilinogen, Ur: 0.2 mg/dL (ref 0.2–1.0)
pH, UA: 5.5 (ref 5.0–7.5)

## 2020-05-22 NOTE — Assessment & Plan Note (Addendum)
Orthostatic measurements today negative but does have postural hypotensive symptoms. Likely exacerbated by postpartum anemia and inactivity. Will obtain labs. Neuro exam wnl. Postpartum anxiety may also be contributing, has psych appt today. Recommend copious oral hydration and taking time when changing positions. Referral placed to different OB/GYN per patient preference due to location for postpartum preE monitoring.

## 2020-05-22 NOTE — Assessment & Plan Note (Addendum)
Likely multifactorial with postpartum anemia, hypotension and dehydration as well as exacerbated anxiety contributing. Neuro exam wnl today. BP low normal so doubt worsened preE. Referral placed to OB/GYN for postpartum preE monitoring. Has psych appt today. Continue OTC pain relief with tylenol/ibuprofen, increase oral hydration. F/u if no better.

## 2020-05-22 NOTE — Assessment & Plan Note (Signed)
Likely contributing to constellation of symptoms and exacerbated by recent traumatic postpartum experience with preE with severe features. Has appt today with therapist, believe consistent use of antidepressant would be highly beneficial, defer to psych.

## 2020-05-22 NOTE — Assessment & Plan Note (Signed)
Resolved

## 2020-05-22 NOTE — Patient Instructions (Addendum)
It was great to see you! I'm glad your back pain is better  Our plans for today:  - We are referring you to a different OB/GYN, someone should contact you about an appointment. - Keep your appointment with your therapist today. I think a daily anti-anxiety medication would also help with your symptoms, make sure to mention this at your appointment.  - Make sure you are drinking plenty of water, aim for 8 8oz glasses of water per day. This should help with urination, dizziness, and blood pressure. Take your time when changing positions to give time for your blood pressure to adjust.  - Continue to take tylenol and ibuprofen for your headache and get plenty of rest. Elicit help from family to help take care of your daughter. - Continue taking your iron supplements. Take this with acidic foods like fruit or orange juice to increase absorption.   We are checking some labs today, we will release these results to your MyChart.  Take care and seek immediate care sooner if you develop any concerns.   Dr. Linwood Dibbles

## 2020-05-22 NOTE — Assessment & Plan Note (Signed)
Mainly decrease in urine output with straining. UA with casts and sediment indicating slight dehydration. Does have few bacteria and WBC, will send for culture prior to treating but suspect dirty catch rather than true infection. Recommend increased oral hydration, will notify of culture results and treat as indicated.

## 2020-05-23 LAB — CBC
Hematocrit: 42.9 % (ref 34.0–46.6)
Hemoglobin: 13.8 g/dL (ref 11.1–15.9)
MCH: 28.6 pg (ref 26.6–33.0)
MCHC: 32.2 g/dL (ref 31.5–35.7)
MCV: 89 fL (ref 79–97)
Platelets: 485 10*3/uL — ABNORMAL HIGH (ref 150–450)
RBC: 4.83 x10E6/uL (ref 3.77–5.28)
RDW: 14.9 % (ref 11.7–15.4)
WBC: 8.3 10*3/uL (ref 3.4–10.8)

## 2020-05-23 LAB — VITAMIN B12: Vitamin B-12: 718 pg/mL (ref 232–1245)

## 2020-05-23 LAB — COMPREHENSIVE METABOLIC PANEL
ALT: 18 IU/L (ref 0–32)
AST: 12 IU/L (ref 0–40)
Albumin/Globulin Ratio: 1.8 (ref 1.2–2.2)
Albumin: 4.8 g/dL (ref 3.8–4.8)
Alkaline Phosphatase: 94 IU/L (ref 44–121)
BUN/Creatinine Ratio: 29 — ABNORMAL HIGH (ref 9–23)
BUN: 20 mg/dL (ref 6–20)
Bilirubin Total: 0.8 mg/dL (ref 0.0–1.2)
CO2: 24 mmol/L (ref 20–29)
Calcium: 10.2 mg/dL (ref 8.7–10.2)
Chloride: 101 mmol/L (ref 96–106)
Creatinine, Ser: 0.7 mg/dL (ref 0.57–1.00)
GFR calc Af Amer: 133 mL/min/{1.73_m2} (ref >59)
GFR calc non Af Amer: 115 mL/min/{1.73_m2} (ref >59)
Globulin, Total: 2.7 g/dL (ref 1.5–4.5)
Glucose: 65 mg/dL (ref 65–99)
Potassium: 4.7 mmol/L (ref 3.5–5.2)
Sodium: 140 mmol/L (ref 134–144)
Total Protein: 7.5 g/dL (ref 6.0–8.5)

## 2020-05-24 ENCOUNTER — Emergency Department: Payer: Medicaid Other

## 2020-05-24 ENCOUNTER — Encounter: Payer: Self-pay | Admitting: Emergency Medicine

## 2020-05-24 ENCOUNTER — Other Ambulatory Visit: Payer: Self-pay

## 2020-05-24 ENCOUNTER — Emergency Department
Admission: EM | Admit: 2020-05-24 | Discharge: 2020-05-24 | Disposition: A | Discharge status: Home / Self Care | Payer: Medicaid Other | Attending: Emergency Medicine | Admitting: Emergency Medicine

## 2020-05-24 DIAGNOSIS — R0602 Shortness of breath: Secondary | ICD-10-CM | POA: Diagnosis not present

## 2020-05-24 DIAGNOSIS — N3001 Acute cystitis with hematuria: Secondary | ICD-10-CM | POA: Insufficient documentation

## 2020-05-24 DIAGNOSIS — R531 Weakness: Secondary | ICD-10-CM

## 2020-05-24 DIAGNOSIS — O9089 Other complications of the puerperium, not elsewhere classified: Secondary | ICD-10-CM | POA: Insufficient documentation

## 2020-05-24 DIAGNOSIS — R000 Tachycardia, unspecified: Secondary | ICD-10-CM | POA: Diagnosis not present

## 2020-05-24 DIAGNOSIS — J90 Pleural effusion, not elsewhere classified: Secondary | ICD-10-CM | POA: Diagnosis not present

## 2020-05-24 DIAGNOSIS — R002 Palpitations: Secondary | ICD-10-CM | POA: Diagnosis not present

## 2020-05-24 DIAGNOSIS — Z87891 Personal history of nicotine dependence: Secondary | ICD-10-CM | POA: Diagnosis not present

## 2020-05-24 DIAGNOSIS — R079 Chest pain, unspecified: Secondary | ICD-10-CM | POA: Diagnosis not present

## 2020-05-24 DIAGNOSIS — I2699 Other pulmonary embolism without acute cor pulmonale: Secondary | ICD-10-CM | POA: Diagnosis not present

## 2020-05-24 DIAGNOSIS — K3189 Other diseases of stomach and duodenum: Secondary | ICD-10-CM | POA: Diagnosis not present

## 2020-05-24 LAB — HEPATIC FUNCTION PANEL
ALT: 21 U/L (ref 0–44)
AST: 26 U/L (ref 15–41)
Albumin: 4.1 g/dL (ref 3.5–5.0)
Alkaline Phosphatase: 67 U/L (ref 38–126)
Bilirubin, Direct: 0.2 mg/dL (ref 0.0–0.2)
Indirect Bilirubin: 0.8 mg/dL (ref 0.3–0.9)
Total Bilirubin: 1 mg/dL (ref 0.3–1.2)
Total Protein: 7.4 g/dL (ref 6.5–8.1)

## 2020-05-24 LAB — BASIC METABOLIC PANEL
Anion gap: 10 (ref 5–15)
BUN: 26 mg/dL — ABNORMAL HIGH (ref 6–20)
CO2: 22 mmol/L (ref 22–32)
Calcium: 9.3 mg/dL (ref 8.9–10.3)
Chloride: 103 mmol/L (ref 98–111)
Creatinine, Ser: 0.72 mg/dL (ref 0.44–1.00)
GFR, Estimated: >60 mL/min (ref >60)
Glucose, Bld: 116 mg/dL — ABNORMAL HIGH (ref 70–99)
Potassium: 4.6 mmol/L (ref 3.5–5.1)
Sodium: 135 mmol/L (ref 135–145)

## 2020-05-24 LAB — CBC
HCT: 39.5 % (ref 36.0–46.0)
Hemoglobin: 12.7 g/dL (ref 12.0–15.0)
MCH: 28 pg (ref 26.0–34.0)
MCHC: 32.2 g/dL (ref 30.0–36.0)
MCV: 87.2 fL (ref 80.0–100.0)
Platelets: 419 10*3/uL — ABNORMAL HIGH (ref 150–400)
RBC: 4.53 MIL/uL (ref 3.87–5.11)
RDW: 15.7 % — ABNORMAL HIGH (ref 11.5–15.5)
WBC: 7 10*3/uL (ref 4.0–10.5)
nRBC: 0 % (ref 0.0–0.2)

## 2020-05-24 LAB — URINALYSIS, COMPLETE (UACMP) WITH MICROSCOPIC
Bilirubin Urine: NEGATIVE
Glucose, UA: NEGATIVE mg/dL
Ketones, ur: NEGATIVE mg/dL
Nitrite: NEGATIVE
Protein, ur: 30 mg/dL — AB
RBC / HPF: >50 RBC/hpf — ABNORMAL HIGH (ref 0–5)
Specific Gravity, Urine: 1.031 — ABNORMAL HIGH (ref 1.005–1.030)
WBC, UA: >50 WBC/hpf — ABNORMAL HIGH (ref 0–5)
pH: 5 (ref 5.0–8.0)

## 2020-05-24 LAB — BRAIN NATRIURETIC PEPTIDE: B Natriuretic Peptide: 8.8 pg/mL (ref 0.0–100.0)

## 2020-05-24 LAB — TROPONIN I (HIGH SENSITIVITY)
Troponin I (High Sensitivity): 3 ng/L (ref <18)
Troponin I (High Sensitivity): 6 ng/L (ref <18)

## 2020-05-24 MED ORDER — LACTATED RINGERS IV BOLUS
1000.0000 mL | Freq: Once | INTRAVENOUS | Status: AC
  Administered 2020-05-24: 1000 mL via INTRAVENOUS

## 2020-05-24 MED ORDER — ACETAMINOPHEN 500 MG PO TABS
1000.0000 mg | ORAL_TABLET | Freq: Once | ORAL | Status: AC
  Administered 2020-05-24: 1000 mg via ORAL
  Filled 2020-05-24: qty 2

## 2020-05-24 MED ORDER — IOHEXOL 350 MG/ML SOLN
75.0000 mL | Freq: Once | INTRAVENOUS | Status: AC | PRN
  Administered 2020-05-24: 75 mL via INTRAVENOUS

## 2020-05-24 MED ORDER — CEPHALEXIN 500 MG PO CAPS
500.0000 mg | ORAL_CAPSULE | Freq: Once | ORAL | Status: AC
  Administered 2020-05-24: 500 mg via ORAL
  Filled 2020-05-24: qty 1

## 2020-05-24 MED ORDER — CEPHALEXIN 500 MG PO CAPS
500.0000 mg | ORAL_CAPSULE | Freq: Four times a day (QID) | ORAL | 0 refills | Status: AC
Start: 2020-05-24 — End: 2020-05-31

## 2020-05-24 MED ORDER — PROCHLORPERAZINE EDISYLATE 10 MG/2ML IJ SOLN
10.0000 mg | Freq: Once | INTRAMUSCULAR | Status: AC
  Administered 2020-05-24: 10 mg via INTRAVENOUS
  Filled 2020-05-24: qty 2

## 2020-05-24 NOTE — ED Notes (Signed)
Rainbow sent to lab

## 2020-05-24 NOTE — ED Notes (Signed)
Pt unable to urinate despite PO fluids.

## 2020-05-24 NOTE — ED Triage Notes (Signed)
C/O NSVD 11/23.   Since delivery c/o back pain, headache, laibile blood pressure, palpitations.  Father has a history of blood clot disorder.  Had some blood work done and platelets were elevated and urine test was abnormal.  Patient had preeclampsia with pregnancy and magnesium infusion post partem.  AAOx3.  Skin warm and dry. NAD
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
## 2020-05-24 NOTE — ED Provider Notes (Signed)
I seen care of this patient approximately 1500.  PCF 1 providers note for full TS Reading patient initial evaluation assessment.  In brief patient is recently postpartum approximately 2 weeks and presents with some palpitations.  Work-up remarkable for evidence of urinary tract infection and given some back pain plan is to obtain CT abdomen pelvis to assess for evidence of stone.  No stone seen on CT.  Given patient is otherwise hemodynamically stable will suspicion for sepsis at this time.  Discharged with antibiotic for Keflex.  Strict return cautions advised and discussed.   Gilles Chiquito, MD 05/24/20 [phone]

## 2020-05-24 NOTE — ED Notes (Signed)
Pt not in stretcher when CT here to take pt.

## 2020-05-24 NOTE — ED Notes (Signed)
Pt called and stated left ED. Removed IV by self. Pt instructed to return per Katrinka Blazing MD for CT scan and abx.

## 2020-05-25 LAB — URINE CULTURE: Organism ID, Bacteria: NO GROWTH

## 2020-05-26 ENCOUNTER — Other Ambulatory Visit: Payer: Self-pay | Admitting: Nurse Practitioner

## 2020-05-26 ENCOUNTER — Telehealth: Payer: Self-pay

## 2020-05-26 MED ORDER — CLONAZEPAM 0.5 MG PO TABS: 0.2500 mg | ORAL_TABLET | Freq: Two times a day (BID) | ORAL | 0 refills | Status: DC | PRN

## 2020-05-26 NOTE — Telephone Encounter (Signed)
Pt returned call; adv I could not adv her b/c we haven't seen her; did adv her to see [facility], ED, or primary re H/As.

## 2020-05-26 NOTE — Telephone Encounter (Signed)
Sending to you since Dr.Rumball is not in today

## 2020-05-26 NOTE — Telephone Encounter (Signed)
Pt calling; has [facility] PT pp appt on the 22nd; having really back H/As; nothing helps - magnessium, tylenol, IBU; worse c walking/standing/moving around/even with sitting; only relief is sleeping.  Had epidural during labor, severe pre eclampsia; has had four doses of antibx for UTI - doesn't feel much better.  [phone]  VM not set up/full; [facility] ref pt to [place]; pt delivered at [facility].

## 2020-05-27 LAB — URINE CULTURE: Culture: 20000 — AB

## 2020-05-27 NOTE — Telephone Encounter (Signed)
Please get patient scheduled asap

## 2020-05-28 ENCOUNTER — Encounter: Payer: Self-pay | Admitting: Nurse Practitioner

## 2020-05-28 ENCOUNTER — Ambulatory Visit (INDEPENDENT_AMBULATORY_CARE_PROVIDER_SITE_OTHER): Payer: Medicaid Other | Admitting: Nurse Practitioner

## 2020-05-28 ENCOUNTER — Other Ambulatory Visit: Payer: Self-pay

## 2020-05-28 VITALS — BP 122/89 | HR 90 | Temp 98.4°F | Ht 63.19 in | Wt 104.6 lb

## 2020-05-28 DIAGNOSIS — R519 Headache, unspecified: Secondary | ICD-10-CM

## 2020-05-28 MED ORDER — TRAMADOL HCL 50 MG PO TABS: 50.0000 mg | ORAL_TABLET | Freq: Three times a day (TID) | ORAL | 0 refills | Status: AC | PRN
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
## 2020-05-28 NOTE — Patient Instructions (Signed)
Spinal Headache  A spinal headache is a severe headache that can happen after a person has had a lumbar puncture or epidural anesthesia. During these procedures, a needle is passed between the bones of the spine. The headache usually starts from a few hours to 1-2 days after that. The headache can last for a few days. In rare cases, it can last for more than a week. What are the causes? This condition is caused by a leak of spinal fluid from the spine through the hole that is left by the needle. What are the signs or symptoms? Symptoms of this condition include:  A severe headache.  A headache that is worse when you sit or stand and better when you lie down.  Neck pain and stiffness, especially when tilting your chin toward your chest.  Nausea and vomiting. How is this diagnosed? This condition is usually diagnosed based on:  Your medical history.  Your symptoms.  A CT scan or MRI of the brain to help rule out other conditions. How is this treated? Treatment for this condition may include:  Replacing fluids that leaked out through the needle hole. Fluids may be replaced by: ? Drinking more fluids. ? Getting fluids through an IV line that is inserted into one of your veins.  Caffeine to help reduce your headache. Your health care provider may recommend drinking caffeinated beverages such as soda, coffee, or tea.  Having an epidural blood patch procedure. In this procedure, a small amount of your blood is injected into the area of the leak in order to seal it.  Medicines for pain.  Resting and lying flat for a few days. Follow these instructions at home:      Take over-the-counter and prescription medicines only as told by your health care provider.  Drink enough fluids to keep your urine pale yellow.  Drink caffeinated beverages as told by your health care provider.  Lie down to relieve pain if your pain gets worse when you sit or stand.  Return to your normal  activities as told by your health care provider. Ask your health care provider what activities are safe for you.  Keep all follow-up visits as told by your health care provider. This is important. Contact a health care provider if:  You develop nausea and vomiting. Get help right away if:  Your pain becomes very severe.  Your pain cannot be controlled.  You develop a fever.  You have a stiff neck.  You develop problems with your vision.  You lose control of your bowel or bladder (have incontinence).  You have trouble walking, you feel weak, or you lose feeling in part of your body. Summary  A spinal headache is a severe headache that can happen after a person has had a lumbar puncture or epidural anesthesia.  This condition is caused by a leak of spinal fluid from the spine through the hole that is left by the needle. The headache can last for a few days. In rare cases, it lasts for more than a week.  Supportive measures, such as drinking more fluid and taking pain medicines, are usually recommended. In some cases, it may be necessary to inject a small amount of your blood to seal the leak. This information is not intended to replace advice given to you by your health care provider. Make sure you discuss any questions you have with your health care provider. Document Revised: 07/14/2017 Document Reviewed: 07/13/2017 Elsevier Patient Education  2020 [employer].

## 2020-05-28 NOTE — Assessment & Plan Note (Signed)
Ongoing post delivery with history of similar with 1st pregnancy -- spinal HA with epidural.  Suspect this is spinal headache due to current symptoms.  No red flags on exam.  Recommend she lie flat as often as possible and ensure good hydration at home.  She is to pick up Fioricet as prescribed by GYN and will send in short burst of Tramadol to use as needed only -- use sparingly. Recommendations: lie in darkened room and apply cold packs prn for pain, side effect profile discussed in detail, asked to keep headache diary and patient reassured that neurodiagnostic workup not indicated from benign H & P.  To return to office in 4 weeks for follow-up, sooner if worsening HA -- immediately go to ER for any red flag symptoms.

## 2020-05-30 ENCOUNTER — Other Ambulatory Visit: Payer: Self-pay | Admitting: Nurse Practitioner

## 2020-05-30 MED ORDER — CLONAZEPAM 0.5 MG PO TABS: 0.2500 mg | ORAL_TABLET | Freq: Two times a day (BID) | ORAL | 0 refills | Status: DC | PRN

## 2020-06-02 NOTE — Telephone Encounter (Signed)
I do not understand why this patient was scheduled with me. She delivered at [facility] on 11/23. These seem like issues she needs to address with [facility].

## 2020-06-02 NOTE — Telephone Encounter (Signed)
Contacted patient and advised that [facility] will not see her until after her postpartum follow up is completed by the delivering provider. Informed patient if she is having GYN issues after 6 weeks of delivery she can call [facility] to schedule an appointment.  Patient expressed understanding but stated that she could not afford the gas to travel to [facility].  Offered to refer patient to Child psychotherapist for gas assistance.  Patient not interested in speaking with social worker at this time.

## 2020-06-04 ENCOUNTER — Encounter: Payer: Self-pay | Admitting: Obstetrics and Gynecology

## 2020-06-09 ENCOUNTER — Other Ambulatory Visit: Payer: Self-pay | Admitting: Nurse Practitioner

## 2020-06-09 MED ORDER — RIZATRIPTAN BENZOATE 5 MG PO TABS: 5.0000 mg | ORAL_TABLET | ORAL | 0 refills | Status: DC | PRN

## 2020-06-10 ENCOUNTER — Other Ambulatory Visit: Payer: Self-pay | Admitting: Family Medicine

## 2020-06-10 NOTE — Telephone Encounter (Signed)
Routing to provider  

## 2020-06-11 DIAGNOSIS — R519 Headache, unspecified: Secondary | ICD-10-CM | POA: Diagnosis not present

## 2020-06-11 DIAGNOSIS — O99335 Smoking (tobacco) complicating the puerperium: Secondary | ICD-10-CM | POA: Diagnosis not present

## 2020-06-11 DIAGNOSIS — F419 Anxiety disorder, unspecified: Secondary | ICD-10-CM | POA: Diagnosis not present

## 2020-06-11 DIAGNOSIS — O99345 Other mental disorders complicating the puerperium: Secondary | ICD-10-CM | POA: Diagnosis not present

## 2020-06-11 DIAGNOSIS — O9089 Other complications of the puerperium, not elsewhere classified: Secondary | ICD-10-CM | POA: Diagnosis not present

## 2020-06-11 DIAGNOSIS — Z20822 Contact with and (suspected) exposure to covid-19: Secondary | ICD-10-CM | POA: Diagnosis not present

## 2020-06-11 DIAGNOSIS — F17210 Nicotine dependence, cigarettes, uncomplicated: Secondary | ICD-10-CM | POA: Diagnosis not present

## 2020-06-11 DIAGNOSIS — Z882 Allergy status to sulfonamides status: Secondary | ICD-10-CM | POA: Diagnosis not present

## 2020-06-12 DIAGNOSIS — O9089 Other complications of the puerperium, not elsewhere classified: Secondary | ICD-10-CM | POA: Diagnosis not present

## 2020-06-12 DIAGNOSIS — F17210 Nicotine dependence, cigarettes, uncomplicated: Secondary | ICD-10-CM | POA: Diagnosis not present

## 2020-06-12 DIAGNOSIS — R519 Headache, unspecified: Secondary | ICD-10-CM | POA: Diagnosis not present

## 2020-06-12 DIAGNOSIS — O99335 Smoking (tobacco) complicating the puerperium: Secondary | ICD-10-CM | POA: Diagnosis not present

## 2020-06-12 DIAGNOSIS — Z20822 Contact with and (suspected) exposure to covid-19: Secondary | ICD-10-CM | POA: Diagnosis not present

## 2020-06-12 DIAGNOSIS — O99345 Other mental disorders complicating the puerperium: Secondary | ICD-10-CM | POA: Diagnosis not present

## 2020-06-12 DIAGNOSIS — Z882 Allergy status to sulfonamides status: Secondary | ICD-10-CM | POA: Diagnosis not present

## 2020-06-12 DIAGNOSIS — F419 Anxiety disorder, unspecified: Secondary | ICD-10-CM | POA: Diagnosis not present

## 2020-06-16 ENCOUNTER — Other Ambulatory Visit: Payer: Self-pay | Admitting: Nurse Practitioner

## 2020-06-16 DIAGNOSIS — G43009 Migraine without aura, not intractable, without status migrainosus: Secondary | ICD-10-CM

## 2020-06-18 ENCOUNTER — Emergency Department
Admission: EM | Admit: 2020-06-18 | Discharge: 2020-06-18 | Disposition: A | Discharge status: Home / Self Care | Payer: Medicaid Other | Attending: Emergency Medicine | Admitting: Emergency Medicine

## 2020-06-18 ENCOUNTER — Other Ambulatory Visit: Payer: Self-pay

## 2020-06-18 ENCOUNTER — Emergency Department: Payer: Medicaid Other

## 2020-06-18 ENCOUNTER — Other Ambulatory Visit: Payer: Self-pay | Admitting: Nurse Practitioner

## 2020-06-18 ENCOUNTER — Encounter (INDEPENDENT_AMBULATORY_CARE_PROVIDER_SITE_OTHER): Payer: Self-pay

## 2020-06-18 ENCOUNTER — Encounter: Payer: Self-pay | Admitting: Emergency Medicine

## 2020-06-18 DIAGNOSIS — Z87891 Personal history of nicotine dependence: Secondary | ICD-10-CM | POA: Diagnosis not present

## 2020-06-18 DIAGNOSIS — N819 Female genital prolapse, unspecified: Secondary | ICD-10-CM | POA: Insufficient documentation

## 2020-06-18 DIAGNOSIS — R399 Unspecified symptoms and signs involving the genitourinary system: Secondary | ICD-10-CM

## 2020-06-18 DIAGNOSIS — N939 Abnormal uterine and vaginal bleeding, unspecified: Secondary | ICD-10-CM | POA: Diagnosis not present

## 2020-06-18 DIAGNOSIS — N390 Urinary tract infection, site not specified: Secondary | ICD-10-CM | POA: Diagnosis not present

## 2020-06-18 LAB — URINALYSIS, COMPLETE (UACMP) WITH MICROSCOPIC
Bilirubin Urine: NEGATIVE
Glucose, UA: NEGATIVE mg/dL
Ketones, ur: NEGATIVE mg/dL
Nitrite: NEGATIVE
Protein, ur: NEGATIVE mg/dL
Specific Gravity, Urine: 1.014 (ref 1.005–1.030)
pH: 6 (ref 5.0–8.0)

## 2020-06-18 LAB — CBC WITH DIFFERENTIAL/PLATELET
Abs Immature Granulocytes: 0.02 10*3/uL (ref 0.00–0.07)
Basophils Absolute: 0 10*3/uL (ref 0.0–0.1)
Basophils Relative: 1 %
Eosinophils Absolute: 0.2 10*3/uL (ref 0.0–0.5)
Eosinophils Relative: 4 %
HCT: 36.3 % (ref 36.0–46.0)
Hemoglobin: 11.9 g/dL — ABNORMAL LOW (ref 12.0–15.0)
Immature Granulocytes: 0 %
Lymphocytes Relative: 36 %
Lymphs Abs: 2 10*3/uL (ref 0.7–4.0)
MCH: 29 pg (ref 26.0–34.0)
MCHC: 32.8 g/dL (ref 30.0–36.0)
MCV: 88.5 fL (ref 80.0–100.0)
Monocytes Absolute: 0.5 10*3/uL (ref 0.1–1.0)
Monocytes Relative: 9 %
Neutro Abs: 2.7 10*3/uL (ref 1.7–7.7)
Neutrophils Relative %: 50 %
Platelets: 274 10*3/uL (ref 150–400)
RBC: 4.1 MIL/uL (ref 3.87–5.11)
RDW: 18.3 % — ABNORMAL HIGH (ref 11.5–15.5)
WBC: 5.4 10*3/uL (ref 4.0–10.5)
nRBC: 0 % (ref 0.0–0.2)

## 2020-06-18 LAB — BASIC METABOLIC PANEL
Anion gap: 8 (ref 5–15)
BUN: 18 mg/dL (ref 6–20)
CO2: 26 mmol/L (ref 22–32)
Calcium: 9.2 mg/dL (ref 8.9–10.3)
Chloride: 102 mmol/L (ref 98–111)
Creatinine, Ser: 0.62 mg/dL (ref 0.44–1.00)
GFR, Estimated: >60 mL/min (ref >60)
Glucose, Bld: 107 mg/dL — ABNORMAL HIGH (ref 70–99)
Potassium: 3.7 mmol/L (ref 3.5–5.1)
Sodium: 136 mmol/L (ref 135–145)

## 2020-06-18 LAB — POC URINE PREG, ED: Preg Test, Ur: NEGATIVE

## 2020-06-18 MED ORDER — CEPHALEXIN 500 MG PO CAPS: 500.0000 mg | ORAL_CAPSULE | Freq: Four times a day (QID) | ORAL | 0 refills | Status: AC

## 2020-06-18 MED ORDER — CEPHALEXIN 500 MG PO CAPS: 500.0000 mg | ORAL_CAPSULE | Freq: Four times a day (QID) | ORAL | 0 refills | Status: DC
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
## 2020-06-18 NOTE — ED Triage Notes (Signed)
Pt comes into the ED via POV c/o vaginal bleeding and possible prolapse.  Pt had a baby 1 month ago and then lifted a TV with her husband.  Pt states she felt like she had a vaginal prolapse and she pushed it back in.  Pt is now having bleeding and states that when she feels inside the vagina, it is now "hardened".  Pt c/o abdominal cramping an back pain that is now associated with it as well.  Pt in NAd at this time with even and unlabored respirations.

## 2020-06-18 NOTE — Discharge Instructions (Signed)
Take the antibiotic as prescribed and finish the full 7-day course.  Make an appointment to follow-up with an area OB/GYN within the next 2 weeks.  We have provided referral information for Dr. Jean Rosenthal at the [facility], or [facility].  You can follow-up at either one.  Return to the ER for new, worsening, or persistent severe abdominal pain, urinary symptoms, vomiting, fever, weakness, or any other new or worsening symptoms that concern you.

## 2020-06-26 ENCOUNTER — Other Ambulatory Visit: Payer: Self-pay

## 2020-06-26 ENCOUNTER — Ambulatory Visit (INDEPENDENT_AMBULATORY_CARE_PROVIDER_SITE_OTHER): Payer: Medicaid Other | Admitting: Nurse Practitioner

## 2020-06-26 ENCOUNTER — Encounter: Payer: Self-pay | Admitting: Nurse Practitioner

## 2020-06-26 VITALS — BP 105/75 | HR 98 | Temp 98.3°F | Ht 64.0 in | Wt 103.8 lb

## 2020-06-26 DIAGNOSIS — G43109 Migraine with aura, not intractable, without status migrainosus: Secondary | ICD-10-CM | POA: Diagnosis not present

## 2020-06-26 DIAGNOSIS — N3000 Acute cystitis without hematuria: Secondary | ICD-10-CM

## 2020-06-26 MED ORDER — PROPRANOLOL HCL 10 MG PO TABS: 10.0000 mg | ORAL_TABLET | Freq: Every day | ORAL | 1 refills | Status: DC

## 2020-06-26 NOTE — Progress Notes (Signed)
BP 105/75   Pulse 98   Temp 98.3 F (36.8 C) (Oral)   Ht 5\' 4"  (1.626 m)   Wt 103 lb 12.8 oz (47.1 kg)   SpO2 99%   BMI 17.82 kg/m    Subjective:    Patient ID: , female    DOB: 26-Oct-1985, 35 y.o.   MRN: [id number]  HPI: Megan Bentley is a 35 y.o. female  Chief Complaint  Patient presents with  . Headache    F/U, Patient states that they were better but she has one this past week    MIGRAINES Started on Amitriptyline every night and Maxalt as needed.  These have helped some --- had none for 2 weeks and then one this week.  Has referral to neurology at [facility] with Dr. 34, who her sister and mother see due to MS.  Has not heard from them to schedule as of yet.  Recently treated for UTI with Keflex, continues on doses and would like urine rechecked when complete. Duration: chronic Onset: gradual Severity: mild Quality: dull, aching and throbbing Frequency: intermittent Location: frontal headache and behind eyes Headache duration: over 24 hours Radiation: no Time of day headache occurs: vary Alleviating factors: Maxalt and Amitriptyline Aggravating factors: unknown Headache status at time of visit: current headache Treatments attempted: Treatments attempted: maxalt and amitriptyline   Aura: yes Nausea:  no Vomiting: no Photophobia:  yes Phonophobia:  yes Effect on social functioning:  yes Numbers of missed days of school/work each month: none Confusion:  no Gait disturbance/ataxia:  no Behavioral changes:  no Fevers:  no  Relevant past medical, surgical, family and social history reviewed and updated as indicated. Interim medical history since our last visit reviewed. Allergies and medications reviewed and updated.  Review of Systems  Constitutional: Negative for activity change, appetite change, diaphoresis, fatigue and fever.  Respiratory: Negative for cough, chest tightness, shortness of breath and wheezing.   Cardiovascular: Negative for  chest pain, palpitations and leg swelling.  Gastrointestinal: Negative for abdominal distention, abdominal pain, constipation, diarrhea, nausea and vomiting.  Neurological: Positive for headaches. Negative for dizziness, syncope, weakness, light-headedness and numbness.  Psychiatric/Behavioral: Negative.     Per HPI unless specifically indicated above     Objective:    BP 105/75   Pulse 98   Temp 98.3 F (36.8 C) (Oral)   Ht 5\' 4"  (1.626 m)   Wt 103 lb 12.8 oz (47.1 kg)   SpO2 99%   BMI 17.82 kg/m   Wt Readings from Last 3 Encounters:  06/26/20 103 lb 12.8 oz (47.1 kg)  06/18/20 106 lb (48.1 kg)  05/28/20 104 lb 9.6 oz (47.4 kg)    Physical Exam Vitals and nursing note reviewed.  Constitutional:      General: She is awake. She is not in acute distress.    Appearance: She is well-developed and well-groomed. She is not ill-appearing or toxic-appearing.  HENT:     Head: Normocephalic.     Right Ear: Hearing, tympanic membrane, ear canal and external ear normal.     Left Ear: Hearing, tympanic membrane, ear canal and external ear normal.     Nose:     Right Sinus: No maxillary sinus tenderness or frontal sinus tenderness.     Left Sinus: No maxillary sinus tenderness or frontal sinus tenderness.     Mouth/Throat:     Mouth: Mucous membranes are moist.  Eyes:     General: Lids are normal.  Right eye: No discharge.        Left eye: No discharge.     Extraocular Movements: Extraocular movements intact.     Conjunctiva/sclera: Conjunctivae normal.     Pupils: Pupils are equal, round, and reactive to light.     Visual Fields: Right eye visual fields normal and left eye visual fields normal.  Neck:     Thyroid: No thyromegaly.     Vascular: No carotid bruit or JVD.  Cardiovascular:     Rate and Rhythm: Normal rate and regular rhythm.     Heart sounds: Normal heart sounds. No murmur heard. No gallop.   Pulmonary:     Effort: Pulmonary effort is normal.     Breath  sounds: Normal breath sounds.  Abdominal:     General: Bowel sounds are normal.     Palpations: Abdomen is soft. There is no hepatomegaly or splenomegaly.  Musculoskeletal:     Cervical back: Normal range of motion and neck supple.     Right lower leg: No edema.     Left lower leg: No edema.  Lymphadenopathy:     Cervical: No cervical adenopathy.  Skin:    General: Skin is warm and dry.  Neurological:     Mental Status: She is alert and oriented to person, place, and time.     Cranial Nerves: Cranial nerves are intact.     Sensory: Sensation is intact.     Motor: Motor function is intact.     Coordination: Coordination is intact.     Gait: Gait is intact.     Deep Tendon Reflexes: Reflexes are normal and symmetric.     Reflex Scores:      Brachioradialis reflexes are 2+ on the right side and 2+ on the left side.      Patellar reflexes are 2+ on the right side and 2+ on the left side. Psychiatric:        Attention and Perception: Attention normal.        Mood and Affect: Mood normal.        Speech: Speech normal.        Behavior: Behavior normal. Behavior is cooperative.        Thought Content: Thought content normal.     Results for orders placed or performed during the hospital encounter of 06/18/20  Urinalysis, Complete w Microscopic  Result Value Ref Range   Color, Urine YELLOW (A) YELLOW   APPearance CLOUDY (A) CLEAR   Specific Gravity, Urine 1.014 1.005 - 1.030   pH 6.0 5.0 - 8.0   Glucose, UA NEGATIVE NEGATIVE mg/dL   Hgb urine dipstick LARGE (A) NEGATIVE   Bilirubin Urine NEGATIVE NEGATIVE   Ketones, ur NEGATIVE NEGATIVE mg/dL   Protein, ur NEGATIVE NEGATIVE mg/dL   Nitrite NEGATIVE NEGATIVE   Leukocytes,Ua MODERATE (A) NEGATIVE   RBC / HPF 0-5 0 - 5 RBC/hpf   WBC, UA 11-20 0 - 5 WBC/hpf   Bacteria, UA RARE (A) NONE SEEN   Squamous Epithelial / LPF 11-20 0 - 5  Basic metabolic panel  Result Value Ref Range   Sodium 136 135 - 145 mmol/L   Potassium 3.7 3.5 -  5.1 mmol/L   Chloride 102 98 - 111 mmol/L   CO2 26 22 - 32 mmol/L   Glucose, Bld 107 (H) 70 - 99 mg/dL   BUN 18 6 - 20 mg/dL   Creatinine, Ser 2.40 0.44 - 1.00 mg/dL   Calcium 9.2 8.9 - 10.3  mg/dL   GFR, Estimated >86 >76 mL/min   Anion gap 8 5 - 15  CBC with Differential  Result Value Ref Range   WBC 5.4 4.0 - 10.5 K/uL   RBC 4.10 3.87 - 5.11 MIL/uL   Hemoglobin 11.9 (L) 12.0 - 15.0 g/dL   HCT 72.0 94.7 - 09.6 %   MCV 88.5 80.0 - 100.0 fL   MCH 29.0 26.0 - 34.0 pg   MCHC 32.8 30.0 - 36.0 g/dL   RDW 28.3 (H) 66.2 - 94.7 %   Platelets 274 150 - 400 K/uL   nRBC 0.0 0.0 - 0.2 %   Neutrophils Relative % 50 %   Neutro Abs 2.7 1.7 - 7.7 K/uL   Lymphocytes Relative 36 %   Lymphs Abs 2.0 0.7 - 4.0 K/uL   Monocytes Relative 9 %   Monocytes Absolute 0.5 0.1 - 1.0 K/uL   Eosinophils Relative 4 %   Eosinophils Absolute 0.2 0.0 - 0.5 K/uL   Basophils Relative 1 %   Basophils Absolute 0.0 0.0 - 0.1 K/uL   Immature Granulocytes 0 %   Abs Immature Granulocytes 0.02 0.00 - 0.07 K/uL  POC urine preg, ED  Result Value Ref Range   Preg Test, Ur NEGATIVE NEGATIVE      Assessment & Plan:   Problem List Items Addressed This Visit      Cardiovascular and Mediastinum   Migraine with aura and without status migrainosus, not intractable - Primary    Chronic, improving with Amitriptyline and Maxalt.  Will add on Propranolol to regimen due to ongoing intermittent episodes, this may also benefit anxiety + at baseline HR runs on higher side -- has had this assessed by cardiology in past with normal work-up.  Script for 10 MG Propranolol to start sent, will increase as needed.  Recommend to continue to monitor for triggers and avoid.  Return to office in 4 weeks for f/u.      Relevant Medications   amitriptyline (ELAVIL) 10 MG tablet   [employer] ACETAMINOPHEN 325 MG tablet   propranolol (INDERAL) 10 MG tablet   Other Relevant Orders   TSH   Basic metabolic panel   CBC with Differential/Platelet     Other Visit Diagnoses    Acute cystitis without hematuria       Will plan to recheck urine once abx course complete and she will call GYN to schedule.   Relevant Orders   Urinalysis, Routine w reflex microscopic       Follow up plan: Return in about 4 weeks (around 07/24/2020) for Migraines.

## 2020-06-26 NOTE — Assessment & Plan Note (Signed)
Chronic, improving with Amitriptyline and Maxalt.  Will add on Propranolol to regimen due to ongoing intermittent episodes, this may also benefit anxiety + at baseline HR runs on higher side -- has had this assessed by cardiology in past with normal work-up.  Script for 10 MG Propranolol to start sent, will increase as needed.  Recommend to continue to monitor for triggers and avoid.  Return to office in 4 weeks for f/u.

## 2020-06-26 NOTE — Patient Instructions (Addendum)
Call [facility] -- [phone] -- referrals were placed beginning of January   Migraine Headache A migraine headache is a very strong throbbing pain on one side or both sides of your head. This type of headache can also cause other symptoms. It can last from 4 hours to 3 days. Talk with your doctor about what things may bring on (trigger) this condition. What are the causes? The exact cause of this condition is not known. This condition may be triggered or caused by:  Drinking alcohol.  Smoking.  Taking medicines, such as: ? Medicine used to treat chest pain (nitroglycerin). ? Birth control pills. ? Estrogen. ? Some blood pressure medicines.  Eating or drinking certain products.  Doing physical activity. Other things that may trigger a migraine headache include:  Having a menstrual period.  Pregnancy.  Hunger.  Stress.  Not getting enough sleep or getting too much sleep.  Weather changes.  Tiredness (fatigue). What increases the risk?  Being 11-62 years old.  Being female.  Having a family history of migraine headaches.  Being Caucasian.  Having depression or anxiety.  Being very overweight. What are the signs or symptoms?  A throbbing pain. This pain may: ? Happen in any area of the head, such as on one side or both sides. ? Make it hard to do daily activities. ? Get worse with physical activity. ? Get worse around bright lights or loud noises.  Other symptoms may include: ? Feeling sick to your stomach (nauseous). ? Vomiting. ? Dizziness. ? Being sensitive to bright lights, loud noises, or smells.  Before you get a migraine headache, you may get warning signs (an aura). An aura may include: ? Seeing flashing lights or having blind spots. ? Seeing bright spots, halos, or zigzag lines. ? Having tunnel vision or blurred vision. ? Having numbness or a tingling feeling. ? Having trouble talking. ? Having weak muscles.  Some  people have symptoms after a migraine headache (postdromal phase), such as: ? Tiredness. ? Trouble thinking (concentrating). How is this treated?  Taking medicines that: ? Relieve pain. ? Relieve the feeling of being sick to your stomach. ? Prevent migraine headaches.  Treatment may also include: ? Having acupuncture. ? Avoiding foods that bring on migraine headaches. ? Learning ways to control your body functions (biofeedback). ? Therapy to help you know and deal with negative thoughts (cognitive behavioral therapy). Follow these instructions at home: Medicines  Take over-the-counter and prescription medicines only as told by your doctor.  Ask your doctor if the medicine prescribed to you: ? Requires you to avoid driving or using heavy machinery. ? Can cause trouble pooping (constipation). You may need to take these steps to prevent or treat trouble pooping:  Drink enough fluid to keep your pee (urine) pale yellow.  Take over-the-counter or prescription medicines.  Eat foods that are high in fiber. These include beans, whole grains, and fresh fruits and vegetables.  Limit foods that are high in fat and sugar. These include fried or sweet foods. Lifestyle  Do not drink alcohol.  Do not use any products that contain nicotine or tobacco, such as cigarettes, e-cigarettes, and chewing tobacco. If you need help quitting, ask your doctor.  Get at least 8 hours of sleep every night.  Limit and deal with stress. General instructions  Keep a journal to find out what may bring on your migraine headaches. For example, write down: ? What you eat and drink. ? How much  sleep you get. ? Any change in what you eat or drink. ? Any change in your medicines.  If you have a migraine headache: ? Avoid things that make your symptoms worse, such as bright lights. ? It may help to lie down in a dark, quiet room. ? Do not drive or use heavy machinery. ? Ask your doctor what activities are  safe for you.  Keep all follow-up visits as told by your doctor. This is important.      Contact a doctor if:  You get a migraine headache that is different or worse than others you have had.  You have more than 15 headache days in one month. Get help right away if:  Your migraine headache gets very bad.  Your migraine headache lasts longer than 72 hours.  You have a fever.  You have a stiff neck.  You have trouble seeing.  Your muscles feel weak or like you cannot control them.  You start to lose your balance a lot.  You start to have trouble walking.  You pass out (faint).  You have a seizure. Summary  A migraine headache is a very strong throbbing pain on one side or both sides of your head. These headaches can also cause other symptoms.  This condition may be treated with medicines and changes to your lifestyle.  Keep a journal to find out what may bring on your migraine headaches.  Contact a doctor if you get a migraine headache that is different or worse than others you have had.  Contact your doctor if you have more than 15 headache days in a month. This information is not intended to replace advice given to you by your health care provider. Make sure you discuss any questions you have with your health care provider. Document Revised: 09/22/2018 Document Reviewed: 07/13/2018 Elsevier Patient Education  2021 [employer].

## 2020-07-01 ENCOUNTER — Other Ambulatory Visit: Payer: Self-pay | Admitting: Nurse Practitioner

## 2020-07-01 MED ORDER — IBUPROFEN 600 MG PO TABS: 600.0000 mg | ORAL_TABLET | Freq: Four times a day (QID) | ORAL | 0 refills | Status: DC | PRN

## 2020-07-01 MED ORDER — CYCLOBENZAPRINE HCL 5 MG PO TABS: 5.0000 mg | ORAL_TABLET | Freq: Three times a day (TID) | ORAL | 1 refills | Status: DC | PRN

## 2020-07-01 NOTE — Progress Notes (Signed)
Ibuprofen and Muscle relaxer fills.

## 2020-07-07 DIAGNOSIS — Z124 Encounter for screening for malignant neoplasm of cervix: Secondary | ICD-10-CM | POA: Diagnosis not present

## 2020-07-07 DIAGNOSIS — N8111 Cystocele, midline: Secondary | ICD-10-CM | POA: Diagnosis not present

## 2020-07-07 DIAGNOSIS — N711 Chronic inflammatory disease of uterus: Secondary | ICD-10-CM | POA: Diagnosis not present

## 2020-07-07 DIAGNOSIS — N920 Excessive and frequent menstruation with regular cycle: Secondary | ICD-10-CM | POA: Diagnosis not present

## 2020-07-07 DIAGNOSIS — N814 Uterovaginal prolapse, unspecified: Secondary | ICD-10-CM | POA: Diagnosis not present

## 2020-07-07 LAB — HM PAP SMEAR

## 2020-07-14 ENCOUNTER — Other Ambulatory Visit: Payer: Self-pay | Admitting: Nurse Practitioner

## 2020-07-14 MED ORDER — AMITRIPTYLINE HCL 10 MG PO TABS: 10.0000 mg | ORAL_TABLET | Freq: Every day | ORAL | 11 refills | Status: DC

## 2020-07-15 DIAGNOSIS — K641 Second degree hemorrhoids: Secondary | ICD-10-CM | POA: Diagnosis not present

## 2020-07-16 ENCOUNTER — Other Ambulatory Visit: Payer: Self-pay | Admitting: Nurse Practitioner

## 2020-07-16 MED ORDER — CLONAZEPAM 0.5 MG PO TABS: 0.2500 mg | ORAL_TABLET | Freq: Two times a day (BID) | ORAL | 0 refills | Status: DC | PRN

## 2020-07-17 DIAGNOSIS — R200 Anesthesia of skin: Secondary | ICD-10-CM | POA: Insufficient documentation

## 2020-07-17 DIAGNOSIS — R42 Dizziness and giddiness: Secondary | ICD-10-CM | POA: Diagnosis not present

## 2020-07-17 DIAGNOSIS — F419 Anxiety disorder, unspecified: Secondary | ICD-10-CM | POA: Diagnosis not present

## 2020-07-17 DIAGNOSIS — R202 Paresthesia of skin: Secondary | ICD-10-CM | POA: Insufficient documentation

## 2020-07-17 DIAGNOSIS — R519 Headache, unspecified: Secondary | ICD-10-CM | POA: Diagnosis not present

## 2020-07-24 DIAGNOSIS — N920 Excessive and frequent menstruation with regular cycle: Secondary | ICD-10-CM | POA: Diagnosis not present

## 2020-07-24 DIAGNOSIS — N8111 Cystocele, midline: Secondary | ICD-10-CM | POA: Diagnosis not present

## 2020-07-24 DIAGNOSIS — K649 Unspecified hemorrhoids: Secondary | ICD-10-CM | POA: Diagnosis not present

## 2020-07-24 DIAGNOSIS — N719 Inflammatory disease of uterus, unspecified: Secondary | ICD-10-CM | POA: Diagnosis not present

## 2020-07-28 DIAGNOSIS — N814 Uterovaginal prolapse, unspecified: Secondary | ICD-10-CM | POA: Diagnosis not present

## 2020-07-28 DIAGNOSIS — R102 Pelvic and perineal pain: Secondary | ICD-10-CM | POA: Diagnosis not present

## 2020-07-28 DIAGNOSIS — N8111 Cystocele, midline: Secondary | ICD-10-CM | POA: Diagnosis not present

## 2020-08-05 ENCOUNTER — Other Ambulatory Visit: Payer: Self-pay | Admitting: Nurse Practitioner

## 2020-08-05 NOTE — Telephone Encounter (Signed)
Requested Prescriptions  Pending Prescriptions Disp Refills  . amitriptyline (ELAVIL) 10 MG tablet [Pharmacy Med Name: AMITRIPTYLINE HCL 10 MG TAB] 90 tablet 0    Sig: TAKE 1 TABLET BY MOUTH EVERYDAY AT BEDTIME     Psychiatry:  Antidepressants - Heterocyclics (TCAs) Passed - 08/05/2020  1:30 PM      Passed - Valid encounter within last 6 months    Recent Outpatient Visits          1 month ago Migraine with aura and without status migrainosus, not intractable   [facility] [place], [name] T, NP   2 months ago Acute nonintractable headache, unspecified headache type   [facility] [place], Baldwin T, NP   2 months ago Dizziness   [facility] Caro Laroche, DO   2 months ago Generalized anxiety disorder   [facility] [place], Darl Householder, DO   3 months ago Encounter to establish care   [facility] [place], Dorie Rank, NP      Future Appointments            In 2 months Cannady, Dorie Rank, NP [employer], [facility]

## 2020-08-06 ENCOUNTER — Other Ambulatory Visit: Payer: Medicaid Other

## 2020-08-06 ENCOUNTER — Other Ambulatory Visit: Payer: Self-pay | Admitting: Nurse Practitioner

## 2020-08-06 MED ORDER — LIDOCAINE VISCOUS HCL 2 % MT SOLN: 15.0000 mL | OROMUCOSAL | 0 refills | Status: DC | PRN

## 2020-08-06 MED ORDER — IBUPROFEN 600 MG PO TABS: 600.0000 mg | ORAL_TABLET | Freq: Four times a day (QID) | ORAL | 1 refills | Status: DC | PRN

## 2020-08-14 DIAGNOSIS — R55 Syncope and collapse: Secondary | ICD-10-CM | POA: Diagnosis not present

## 2020-08-14 DIAGNOSIS — F410 Panic disorder [episodic paroxysmal anxiety] without agoraphobia: Secondary | ICD-10-CM | POA: Diagnosis not present

## 2020-08-14 DIAGNOSIS — G4489 Other headache syndrome: Secondary | ICD-10-CM | POA: Diagnosis not present

## 2020-08-14 DIAGNOSIS — F419 Anxiety disorder, unspecified: Secondary | ICD-10-CM | POA: Diagnosis not present

## 2020-08-15 DIAGNOSIS — F411 Generalized anxiety disorder: Secondary | ICD-10-CM | POA: Diagnosis not present

## 2020-08-18 NOTE — H&P (Signed)
Megan Bentley is a 35 y.o. female here for [facility] and anterior colporrhaphy and bilateral salpingectomy  . Pt here c/o of tissue falling from the vagina . Known  Cystocele  Some pelvic pain  also has hemorrhoids and Dr Tonna Boehringer is going to repair at the same time of my surgery  Past Medical History:  has a past medical history of Anemia, Anxiety, Anxiety disorder (11/27/2019), Arthritis (September 21 2016), Chronic kidney disease, Preeclampsia, Tobacco abuse (09/03/2016), and Vaginal candidiasis (11/27/2019).  Past Surgical History:  has a past surgical history that includes Kidney stone removal and wisdom tooth (Bilateral). Family History: family history includes Anxiety in her father, maternal grandmother, and mother; Arthritis in her maternal grandmother and paternal grandmother; Coronary Artery Disease (Blocked arteries around heart) in her maternal grandfather, maternal grandmother, and paternal grandfather; Deep vein thrombosis (DVT or abnormal blood clot formation) in her father; High blood pressure (Hypertension) in her father, maternal grandmother, and mother; Hyperlipidemia (Elevated cholesterol) in her father and maternal grandmother; Multiple sclerosis in her mother and sister; Osteoarthritis in her father; Sjogren's syndrome in her father; Skin cancer in her maternal grandfather. Social History:  reports that she has quit smoking. Her smoking use included cigarettes. She has a 16.00 pack-year smoking history. She has never used smokeless tobacco. She reports that she does not drink alcohol and does not use drugs. OB/GYN History:          OB History    Gravida  2   Para  2   Term  2   Preterm      AB      Living  2     SAB      IAB      Ectopic      Molar      Multiple  0   Live Births  2          Allergies: is allergic to sulfa (sulfonamide antibiotics). Medications:  Current Outpatient Medications:  .  ARIPiprazole (ABILIFY) 10 MG tablet, Take 1/2 tab at night for  a week then increase to 1 tab at night and continue that dose, Disp: 30 tablet, Rfl: 1 .  clonazePAM (KLONOPIN) 0.5 MG tablet, Take 0.5 tablets (0.25 mg total) by mouth 2 (two) times daily, Disp: 30 tablet, Rfl: 1 .  cyclobenzaprine (FLEXERIL) 5 MG tablet, Take 5 mg by mouth 3 (three) times daily as needed for Muscle spasms   , Disp: , Rfl:  .  diphenhydrAMINE (BENADRYL) 25 mg capsule, as needed, Disp: , Rfl:  .  ibuprofen (MOTRIN) 600 MG tablet, Take 600 mg by mouth every 6 (six) hours as needed, Disp: , Rfl:  .  nortriptyline (PAMELOR) 10 MG capsule, Start Nortriptyline (Pamelor) 10 mg nightly for one week, then increase to 20 mg nightly, Disp: 60 capsule, Rfl: 3 .  propranoloL (INDERAL) 10 MG tablet, Take 1 tablet by mouth once daily, Disp: , Rfl:  .  rizatriptan (MAXALT) 5 MG tablet, Take by mouth as needed, Disp: , Rfl:  .  amitriptyline (ELAVIL) 10 MG tablet, Take 1 tablet (10 mg total) by mouth nightly (Patient not taking: Reported on 07/24/2020  ), Disp: 30 tablet, Rfl: 0 .  clonazePAM (KLONOPIN) 0.25 MG disintegrating tablet, Take 0.25 mg by mouth once daily as needed (Patient not taking: Reported on 07/24/2020  ), Disp: , Rfl:  .  FLUoxetine (PROZAC) 10 MG capsule, Take 1 capsule (10 mg total) by mouth once daily for 30 days, Disp: 30 capsule,  Rfl: 1 .  ondansetron (ZOFRAN ODT) 4 MG disintegrating tablet, One tablet by mouth every 6 hours as needed for nausea. Do not chew, will dissolve. (Patient not taking: Reported on 07/17/2020  ), Disp: 56 tablet, Rfl: 2 .  pediatric multivitamin (PEDIATRIC MULTIVITAMIN) chewable tablet, Take 2 tablets by mouth once daily (Patient not taking: Reported on 05/02/2020  ), Disp: , Rfl:   Review of Systems: General:                      No fatigue or weight loss Eyes:                           No vision changes Ears:                            No hearing difficulty Respiratory:                No cough or shortness of breath Pulmonary:                  No  asthma or shortness of breath Cardiovascular:           No chest pain, palpitations, dyspnea on exertion Gastrointestinal:          No abdominal bloating, chronic diarrhea, constipations, masses, pain or hematochezia Genitourinary:             No hematuria, dysuria, abnormal vaginal discharge, pelvic pain, Menometrorrhagia, + Pelvic prolapse Lymphatic:                   No swollen lymph nodes Musculoskeletal:         No muscle weakness Neurologic:                  No extremity weakness, syncope, seizure disorder Psychiatric:                  No history of depression, delusions or suicidal/homicidal ideation    Exam:      Vitals:   07/28/20 1628  BP: (!) 134/95  Pulse: 99    Body mass index is 18.37 kg/m.  WDWN white/ black female in NAD   Lungs: CTA  CV : RRR without murmur   Breast: exam done in sitting and lying position : No dimpling or retraction, no dominant mass, no spontaneous discharge, no axillary adenopathy Neck:  no thyromegaly Abdomen: soft , no mass, normal active bowel sounds,  non-tender, no rebound tenderness Pelvic: tanner stage 5 ,  External genitalia: vulva /labia no lesions Urethra: no prolapse Vagina: normal physiologic d/c, grade 3 cystocele with valsalva Cervix: no lesions, no cervical motion tenderness   Uterus: normal size shape and contour, non-tender, second degree descensus Adnexa: no mass,  non-tender   Rectovaginal:   pt was fitted for a #3 ring pessary with sleeve. Felt uncomfortable for her - removed   Impression:   The primary encounter diagnosis was Cystocele, midline. 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    Plan:  TVH and anterior colporrhapy.  Bilateral salpingectomy Dr Tonna Boehringer to follow on with hemorrhoid repair   Risks of the procedure have been discussed with patient ( see Gavin Potters notes)     No follow-ups on file.  Vilma Prader, MD

## 2020-08-22 DIAGNOSIS — F411 Generalized anxiety disorder: Secondary | ICD-10-CM | POA: Diagnosis not present

## 2020-08-23 DIAGNOSIS — F411 Generalized anxiety disorder: Secondary | ICD-10-CM | POA: Diagnosis not present

## 2020-08-27 ENCOUNTER — Ambulatory Visit: Payer: Self-pay | Admitting: Surgery

## 2020-08-27 ENCOUNTER — Other Ambulatory Visit: Payer: Self-pay

## 2020-08-27 ENCOUNTER — Encounter
Admission: RE | Admit: 2020-08-27 | Discharge: 2020-08-27 | Disposition: A | Discharge status: Home / Self Care | Payer: Medicaid Other | Source: Ambulatory Visit | Attending: Obstetrics and Gynecology | Admitting: Obstetrics and Gynecology

## 2020-08-27 ENCOUNTER — Encounter: Payer: Self-pay | Admitting: *Deleted

## 2020-08-27 HISTORY — DX: Other specified postprocedural states: Z98.890

## 2020-08-27 HISTORY — DX: Other specified postprocedural states: R11.2

## 2020-08-27 HISTORY — DX: Pneumonia, unspecified organism: J18.9

## 2020-08-27 HISTORY — DX: Unspecified pre-eclampsia, unspecified trimester: O14.90

## 2020-08-27 HISTORY — DX: Personal history of urinary calculi: Z87.442

## 2020-08-27 NOTE — H&P (Signed)
Subjective:  CC: Grade II hemorrhoids [K64.1]   HPI: Megan Bentley is a 35 y.o. female who was referrred by Ples [facility]er* for above. Symptoms were first noted several years ago. Progressively worse with monthly symptoms. she has some rectal bleeding occurring. Bleeding is described as just notes blood on tissue paper. Alleviated by sitz baths and witch hazel. Minimal discomfort with flare.  Toilet habits: Once ever few days   Past Medical History: has a past medical history of Anemia, Anxiety, Anxiety disorder (11/27/2019), Arthritis (September 21 2016), Chronic kidney disease, Preeclampsia, Tobacco abuse (09/03/2016), and Vaginal candidiasis (11/27/2019).  Past Surgical History: has a past surgical history that includes Kidney stone removal and wisdom tooth (Bilateral).  Family History: family history includes Anxiety in her father, maternal grandmother, and mother; Arthritis in her maternal grandmother and paternal grandmother; Coronary Artery Disease (Blocked arteries around heart) in her maternal grandfather, maternal grandmother, and paternal grandfather; Deep vein thrombosis (DVT or abnormal blood clot formation) in her father; High blood pressure (Hypertension) in her father, maternal grandmother, and mother; Hyperlipidemia (Elevated cholesterol) in her father and maternal grandmother; Multiple sclerosis in her mother and sister; Osteoarthritis in her father; Sjogren's syndrome in her father; Skin cancer in her maternal grandfather.  Social History: reports that she has quit smoking. Her smoking use included cigarettes. She has a 16.00 pack-year smoking history. She has never used smokeless tobacco. She reports that she does not drink alcohol and does not use drugs.  Current Medications: has a current medication list which includes the following prescription(s): amitriptyline, amoxicillin-clavulanate, cyclobenzaprine, diphenhydramine, ibuprofen, propranolol, rizatriptan, doxycycline,  fluoxetine, ondansetron, and pediatric multivitamin.  Allergies:  Allergies as of 07/15/2020 - Reviewed 07/15/2020  Allergen Reaction Noted  . Sulfa (sulfonamide antibiotics) Anaphylaxis 04/20/2015   ROS:  A 15 point review of systems was performed and pertinent positives and negatives noted in HPI  Objective:    BP 116/86  Pulse 91  Ht 162.6 cm (5\' 4" )  Wt 47.6 kg (105 lb)  LMP 06/30/2020  BMI 18.02 kg/m   Constitutional : alert, appears stated age, cooperative and no distress  Lymphatics/Throat:: no asymmetry, masses, or scars  Respiratory: clear to auscultation bilaterally  Cardiovascular: regular rate and rhythm  Gastrointestinal: soft, non-tender; bowel sounds normal; no masses, no organomegaly.  Musculoskeletal: Steady gait and movement  Skin: Cool and moist  Psychiatric: Normal affect, non-agitated, not confused  Genital/Rectal: Chaperone present for exam. External exam negative. DRE revealed, normal rectal tone, with palpable internal hemorrhoid noted at LP, RL portion with minimal discomfort. After obtaining verbal consent, an anoscope was inserted after prepped with lubricant and internal hemorrhoids noted on DRE confirmed visually, with no evidence of thrombosis. No other pathology such as fissures, fistulas, polyps noted. Scope withdrawn and patient tolerate procedure well.   LABS:  n/a   RADS: n/a Assessment:    Grade II hemorrhoids [K64.1]  Plan:   1. Grade II hemorrhoids [K64.1] Discussed risks/benefits/alternatives to surgery. Alternatives include the options of observation, medical management. Benefits include symptomatic relief. I discussed in detail and the complications related to the operation and the anesthesia, including bleeding, infection, recurrence, remote possibility of temporary or permanent fecal incontinence, poor/delayed wound healing, chronic pain, and additional procedures to address said risks. The risks of general anesthetic, if used,  includes MI, CVA, sudden death or even reaction to anesthetic medications also discussed.  We also discussed typical post operative recovery which includes weeks to potentially months of anal pain, drainage, occasional bleeding, and  sense of fecal urgency.   ED return precautions given for sudden increase in pain, bleeding, with possible accompanying fever, nausea, and/or vomiting.  The patient understands the risks, any and all questions were answered to the patient's satisfaction.  2. Patient has elected to proceed with surgical treatment. Procedure will be scheduled. Written consent was obtained. Will schedule as joint case with OBGYN hysterectomy case

## 2020-08-27 NOTE — Patient Instructions (Addendum)
Your procedure is scheduled on:  Friday, March 25 Report to the Registration Desk on the 1st floor of the [facility] To find out your arrival time, please call [phone] between 1PM - 3PM on: Thursday, March 24  REMEMBER: Instructions that are not followed completely may result in serious medical risk, up to and including death; or upon the discretion of your surgeon and anesthesiologist your surgery may need to be rescheduled.  Do not eat food after midnight the night before surgery.  No gum chewing, lozengers or hard candies.  You may however, drink CLEAR liquids up to 2 hours before you are scheduled to arrive for your surgery. Do not drink anything within 2 hours of your scheduled arrival time.  Clear liquids include: - water  - apple juice without pulp - gatorade (not RED, PURPLE, OR BLUE) - black coffee or tea (Do NOT add milk or creamers to the coffee or tea) Do NOT drink anything that is not on this list.  DO NOT TAKE ANY MEDICATIONS THE MORNING OF SURGERY  One week prior to surgery: starting March 18 Stop Anti-inflammatories (NSAIDS) such as Advil, Aleve, Ibuprofen, Motrin, Naproxen, Naprosyn and Aspirin based products such as Excedrin, Goodys Powder, BC Powder. Stop ANY OVER THE COUNTER supplements until after surgery.  No Alcohol for 24 hours before or after surgery.  No Smoking including e-cigarettes for 24 hours prior to surgery.  No chewable tobacco products for at least 6 hours prior to surgery.  No nicotine patches on the day of surgery.  Do not use any "recreational" drugs for at least a week prior to your surgery.  Please be advised that the combination of cocaine and anesthesia may have negative outcomes, up to and including death. If you test positive for cocaine, your surgery will be cancelled.  On the morning of surgery brush your teeth with toothpaste and water, you may rinse your mouth with mouthwash if you wish. Do not swallow any toothpaste or  mouthwash.  Do not wear jewelry, make-up, hairpins, clips or nail polish.  Do not wear lotions, powders, or perfumes.   Do not shave body from the neck down 48 hours prior to surgery just in case you cut yourself which could leave a site for infection.  Also, freshly shaved skin may become irritated if using the CHG soap.  Contact lenses, hearing aids and dentures may not be worn into surgery.  Do not bring valuables to the hospital. [facility] is not responsible for any missing/lost belongings or valuables.   Use CHG Soap as directed on instruction sheet.  Notify your doctor if there is any change in your medical condition (cold, fever, infection).  Wear comfortable clothing (specific to your surgery type) to the hospital.  Plan for stool softeners for home use; pain medications have a tendency to cause constipation. You can also help prevent constipation by eating foods high in fiber such as fruits and vegetables and drinking plenty of fluids as your diet allows.  After surgery, you can help prevent lung complications by doing breathing exercises.  Take deep breaths and cough every 1-2 hours. Your doctor may order a device called an Incentive Spirometer to help you take deep breaths.  If you are being discharged the day of surgery, you will not be allowed to drive home. You will need a responsible adult (18 years or older) to drive you home and stay with you that night.   If you are taking public transportation, you will  need to have a responsible adult (18 years or older) with you. Please confirm with your physician that it is acceptable to use public transportation.   Please call the Pre-admissions Testing Dept. at [phone] if you have any questions about these instructions.  Surgery Visitation Policy:  Patients undergoing a surgery or procedure may have one family member or support person with them as long as that person is not COVID-19 positive or experiencing its  symptoms.  That person may remain in the waiting area during the procedure.

## 2020-09-01 ENCOUNTER — Inpatient Hospital Stay: Admission: RE | Admit: 2020-09-01 | Payer: Medicaid Other | Source: Ambulatory Visit

## 2020-09-02 ENCOUNTER — Encounter (HOSPITAL_COMMUNITY): Payer: Self-pay | Admitting: Urgent Care

## 2020-09-02 ENCOUNTER — Inpatient Hospital Stay: Admission: RE | Admit: 2020-09-02 | Payer: Medicaid Other | Source: Ambulatory Visit

## 2020-09-03 ENCOUNTER — Other Ambulatory Visit: Payer: Medicaid Other

## 2020-09-05 ENCOUNTER — Ambulatory Visit
Admission: RE | Admit: 2020-09-05 | Payer: Medicaid Other | Source: Home / Self Care | Admitting: Obstetrics and Gynecology

## 2020-09-05 ENCOUNTER — Encounter: Admission: RE | Payer: Self-pay | Source: Home / Self Care

## 2020-09-05 DIAGNOSIS — F411 Generalized anxiety disorder: Secondary | ICD-10-CM | POA: Diagnosis not present

## 2020-09-05 SURGERY — HYSTERECTOMY, VAGINAL
Anesthesia: General

## 2020-09-08 ENCOUNTER — Other Ambulatory Visit: Payer: Self-pay | Admitting: Nurse Practitioner

## 2020-09-08 NOTE — Telephone Encounter (Signed)
Requested Prescriptions  Pending Prescriptions Disp Refills  . propranolol (INDERAL) 10 MG tablet [Pharmacy Med Name: PROPRANOLOL 10 MG TABLET] 30 tablet 1    Sig: TAKE 1 TABLET BY MOUTH EVERY DAY     Cardiovascular:  Beta Blockers Passed - 09/08/2020  2:57 PM      Passed - Last BP in normal range    BP Readings from Last 1 Encounters:  06/26/20 105/75         Passed - Last Heart Rate in normal range    Pulse Readings from Last 1 Encounters:  06/26/20 98         Passed - Valid encounter within last 6 months    Recent Outpatient Visits          2 months ago Migraine with aura and without status migrainosus, not intractable   [facility] [place], Longoria T, NP   3 months ago Acute nonintractable headache, unspecified headache type   [facility] [place], Castalia T, NP   3 months ago Dizziness   [facility] Caro Laroche, DO   3 months ago Generalized anxiety disorder   [facility] [place], Darl Householder, DO   5 months ago Encounter to establish care   [facility] [place], Dorie Rank, NP      Future Appointments            In 1 month Cannady, Dorie Rank, NP [employer], [facility]

## 2020-09-11 NOTE — H&P (Signed)
Megan.Bentley a 35 y.o.femalehere for [facility] and anterior colporrhaphy and bilateral salpingectomy  . Pt here c/o of tissue falling from the vagina . Known  Cystocele  Some pelvic pain also has hemorrhoids and Dr Tonna Boehringer is going to repair at the same time of my surgery  Past Medical History:has a past medical history of Anemia, Anxiety, Anxiety disorder (11/27/2019), Arthritis (September 21 2016), Chronic kidney disease, Preeclampsia, Tobacco abuse (09/03/2016), and Vaginal candidiasis (11/27/2019). Past Surgical History:has a past surgical history that includes Kidney stone removal and wisdom tooth (Bilateral). Family History:family history includes Anxiety in her father, maternal grandmother, and mother; Arthritis in her maternal grandmother and paternal grandmother; Coronary Artery Disease (Blocked arteries around heart) in her maternal grandfather, maternal grandmother, and paternal grandfather; Deep vein thrombosis (DVT or abnormal blood clot formation) in her father; High blood pressure (Hypertension) in her father, maternal grandmother, and mother; Hyperlipidemia (Elevated cholesterol) in her father and maternal grandmother; Multiple sclerosis in her mother and sister; Osteoarthritis in her father; Sjogren's syndrome in her father; Skin cancer in her maternal grandfather. Social History:reports that she has quit smoking. Her smoking use included cigarettes. She has a 16.00 pack-year smoking history. She has never used smokeless tobacco. She reports that she does not drink alcohol and does not use drugs. OB/GYN History:                           OB History     Gravida  2   Para  2   Term  2   Preterm     AB     Living  2     SAB     IAB     Ectopic     Molar     Multiple  0   Live Births  2          Allergies:is allergic to sulfa (sulfonamide antibiotics). Medications:  Current Outpatient Medications:  .ARIPiprazole (ABILIFY) 10 MG  tablet, Take 1/2 tab at night for a week then increase to 1 tab at night and continue that dose, Disp: 30 tablet, Rfl: 1 .clonazePAM (KLONOPIN) 0.5 MG tablet, Take 0.5 tablets (0.25 mg total) by mouth 2 (two) times daily, Disp: 30 tablet, Rfl: 1 .cyclobenzaprine (FLEXERIL) 5 MG tablet, Take 5 mg by mouth 3 (three) times daily as needed for Muscle spasms , Disp: , Rfl:  .diphenhydrAMINE (BENADRYL) 25 mg capsule, as needed, Disp: , Rfl:  .ibuprofen (MOTRIN) 600 MG tablet, Take 600 mg by mouth every 6 (six) hours as needed, Disp: , Rfl:  .nortriptyline (PAMELOR) 10 MG capsule, Start Nortriptyline (Pamelor) 10 mg nightly for one week, then increase to 20 mg nightly, Disp: 60 capsule, Rfl: 3 .propranoloL (INDERAL) 10 MG tablet, Take 1 tablet by mouth once daily, Disp: , Rfl:  .rizatriptan (MAXALT) 5 MG tablet, Take by mouth as needed, Disp: , Rfl:  .amitriptyline (ELAVIL) 10 MG tablet, Take 1 tablet (10 mg total) by mouth nightly (Patient not taking: Reported on 07/24/2020 ), Disp: 30 tablet, Rfl: 0 .clonazePAM (KLONOPIN) 0.25 MG disintegrating tablet, Take 0.25 mg by mouth once daily as needed (Patient not taking: Reported on 07/24/2020 ), Disp: , Rfl:  .FLUoxetine (PROZAC) 10 MG capsule, Take 1 capsule (10 mg total) by mouth once daily for 30 days, Disp: 30 capsule, Rfl: 1 .ondansetron (ZOFRAN ODT) 4 MG disintegrating tablet, One tablet by mouth every 6 hours as needed for nausea. Do not chew, will dissolve. (Patient not taking:  Reported on 07/17/2020 ), Disp: 56 tablet, Rfl: 2 .pediatric multivitamin (PEDIATRIC MULTIVITAMIN) chewable tablet, Take 2 tablets by mouth once daily (Patient not taking: Reported on 05/02/2020 ), Disp: , Rfl:   Review of Systems: General: No fatigue or weightloss Eyes:No vision changes Ears:No hearing difficulty Respiratory:No cough or shortness of  breath Pulmonary: No asthma or shortness of breath Cardiovascular:No chest pain, palpitations, dyspnea on exertion Gastrointestinal:No abdominal bloating, chronic diarrhea, constipations, masses, pain or hematochezia Genitourinary:No hematuria, dysuria, abnormal vaginal discharge, pelvic pain, Menometrorrhagia, + Pelvic prolapse Lymphatic:No swollen lymph nodes Musculoskeletal:No muscle weakness Neurologic:No extremity weakness, syncope, seizure disorder Psychiatric:No history of depression, delusions or suicidal/homicidal ideation   Exam:      Vitals:   09/11/20 1628  BP: (!) 134/95  Pulse: 99    Body mass index is 18.37 kg/m.  WDWN white/ black female in NAD  Lungs: CTA  CV: RRR without murmur  Breast:exam done in sitting and lying position : No dimpling or retraction, no dominant mass, no spontaneous discharge, no axillary adenopathy Neck: no thyromegaly Abdomen: soft , no mass, normal active bowel sounds, non-tender, no rebound tenderness Pelvic: tanner stage 5 ,  External genitalia: vulva /labia no lesions Urethra: no prolapse Vagina: normal physiologic d/c, grade 3 cystocele with valsalva Cervix: no lesions, no cervical motion tenderness  Uterus: normal size shape and contour, non-tender, second degree descensus Adnexa:no mass, non-tender  Rectovaginal: pt was fitted for a #3 ring pessary with sleeve. Felt uncomfortable for her - removed  Impression:   The primary encounter diagnosis was Cystocele, midline. 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    Plan:  TVH and anterior colporrhapy.  Bilateral salpingectomy Dr Tonna Boehringer to follow on with hemorrhoid repair  Risks of the procedure have been discussed with patient ( see Gavin Potters notes)     No follow-ups on file.  Vilma Prader, MD

## 2020-09-16 DIAGNOSIS — R200 Anesthesia of skin: Secondary | ICD-10-CM | POA: Diagnosis not present

## 2020-09-18 ENCOUNTER — Other Ambulatory Visit: Payer: Self-pay

## 2020-09-18 ENCOUNTER — Other Ambulatory Visit
Admission: RE | Admit: 2020-09-18 | Discharge: 2020-09-18 | Disposition: A | Discharge status: Home / Self Care | Payer: Medicaid Other | Source: Ambulatory Visit | Attending: Obstetrics and Gynecology | Admitting: Obstetrics and Gynecology

## 2020-09-18 DIAGNOSIS — Z01812 Encounter for preprocedural laboratory examination: Secondary | ICD-10-CM | POA: Diagnosis not present

## 2020-09-18 DIAGNOSIS — Z20822 Contact with and (suspected) exposure to covid-19: Secondary | ICD-10-CM | POA: Insufficient documentation

## 2020-09-18 LAB — TYPE AND SCREEN
ABO/RH(D): O POS
Antibody Screen: NEGATIVE

## 2020-09-18 LAB — CBC
HCT: 37 % (ref 36.0–46.0)
Hemoglobin: 12.6 g/dL (ref 12.0–15.0)
MCH: 32.9 pg (ref 26.0–34.0)
MCHC: 34.1 g/dL (ref 30.0–36.0)
MCV: 96.6 fL (ref 80.0–100.0)
Platelets: 203 10*3/uL (ref 150–400)
RBC: 3.83 MIL/uL — ABNORMAL LOW (ref 3.87–5.11)
RDW: 11.8 % (ref 11.5–15.5)
WBC: 6.8 10*3/uL (ref 4.0–10.5)
nRBC: 0 % (ref 0.0–0.2)

## 2020-09-18 LAB — BASIC METABOLIC PANEL
Anion gap: 9 (ref 5–15)
BUN: 12 mg/dL (ref 6–20)
CO2: 27 mmol/L (ref 22–32)
Calcium: 9.3 mg/dL (ref 8.9–10.3)
Chloride: 98 mmol/L (ref 98–111)
Creatinine, Ser: 0.83 mg/dL (ref 0.44–1.00)
GFR, Estimated: >60 mL/min (ref >60)
Glucose, Bld: 109 mg/dL — ABNORMAL HIGH (ref 70–99)
Potassium: 4 mmol/L (ref 3.5–5.1)
Sodium: 134 mmol/L — ABNORMAL LOW (ref 135–145)

## 2020-09-19 LAB — SARS CORONAVIRUS 2 (TAT 6-24 HRS): SARS Coronavirus 2: NEGATIVE

## 2020-09-21 ENCOUNTER — Encounter: Payer: Self-pay | Admitting: Anesthesiology

## 2020-09-21 DIAGNOSIS — R070 Pain in throat: Secondary | ICD-10-CM | POA: Diagnosis not present

## 2020-09-21 DIAGNOSIS — J111 Influenza due to unidentified influenza virus with other respiratory manifestations: Secondary | ICD-10-CM | POA: Diagnosis not present

## 2020-09-22 ENCOUNTER — Ambulatory Visit
Admission: RE | Admit: 2020-09-22 | Payer: Medicaid Other | Source: Home / Self Care | Admitting: Obstetrics and Gynecology

## 2020-09-22 ENCOUNTER — Encounter: Admission: RE | Payer: Self-pay | Source: Home / Self Care

## 2020-09-22 SURGERY — HYSTERECTOMY, VAGINAL
Anesthesia: General

## 2020-09-26 DIAGNOSIS — F411 Generalized anxiety disorder: Secondary | ICD-10-CM | POA: Diagnosis not present

## 2020-10-03 ENCOUNTER — Encounter: Payer: Self-pay | Admitting: Nurse Practitioner

## 2020-10-03 DIAGNOSIS — F4310 Post-traumatic stress disorder, unspecified: Secondary | ICD-10-CM | POA: Diagnosis not present

## 2020-10-03 DIAGNOSIS — F411 Generalized anxiety disorder: Secondary | ICD-10-CM | POA: Diagnosis not present

## 2020-10-03 DIAGNOSIS — N920 Excessive and frequent menstruation with regular cycle: Secondary | ICD-10-CM | POA: Insufficient documentation

## 2020-10-03 DIAGNOSIS — F331 Major depressive disorder, recurrent, moderate: Secondary | ICD-10-CM | POA: Diagnosis not present

## 2020-10-08 ENCOUNTER — Other Ambulatory Visit: Payer: Self-pay | Admitting: Nurse Practitioner

## 2020-10-08 NOTE — Telephone Encounter (Signed)
Called patient, no answer, unable to leave a message, will try again.   

## 2020-10-08 NOTE — Telephone Encounter (Signed)
Scheduled 4/28

## 2020-10-08 NOTE — Telephone Encounter (Signed)
Requested medication (s) are due for refill today:   Provider to review  Requested medication (s) are on the active medication list:   Yes  Future visit scheduled:   Yes tomorrow   Last ordered: 07/16/2020 #30, 0 refills  Non  delegated refill    Requested Prescriptions  Pending Prescriptions Disp Refills   clonazePAM (KLONOPIN) 0.5 MG tablet [Pharmacy Med Name: CLONAZEPAM 0.5 MG TABLET] 30 tablet 1    Sig: TAKE 0.5 TABLETS (0.25MG ) BY MOUTH 2 TIMES DAILY AS NEEDED FOR ANXIETY.      Not Delegated - Psychiatry:  Anxiolytics/Hypnotics Failed - 10/08/2020 11:09 AM      Failed - This refill cannot be delegated      Passed - Urine Drug Screen completed in last 360 days      Passed - Valid encounter within last 6 months    Recent Outpatient Visits           3 months ago Migraine with aura and without status migrainosus, not intractable   [facility] [place], [name] T, NP   4 months ago Acute nonintractable headache, unspecified headache type   [facility] [place], Dorie Rank, NP   4 months ago Dizziness   [facility] Caro Lar[place], DO   4 months ago Generalized anxiety disorder   [facility] [place], Darl Householder, DO   6 months ago Encounter to establish care   [facility] [place], Dorie Rank, NP       Future Appointments             Tomorrow Marjie Skiff, NP [facility]

## 2020-10-09 ENCOUNTER — Ambulatory Visit (INDEPENDENT_AMBULATORY_CARE_PROVIDER_SITE_OTHER): Payer: Medicaid Other | Admitting: Nurse Practitioner

## 2020-10-09 ENCOUNTER — Encounter: Payer: Self-pay | Admitting: Nurse Practitioner

## 2020-10-09 ENCOUNTER — Other Ambulatory Visit: Payer: Self-pay

## 2020-10-09 VITALS — BP 94/68 | HR 83 | Temp 98.9°F | Wt 98.4 lb

## 2020-10-09 DIAGNOSIS — F419 Anxiety disorder, unspecified: Secondary | ICD-10-CM | POA: Diagnosis not present

## 2020-10-09 DIAGNOSIS — R399 Unspecified symptoms and signs involving the genitourinary system: Secondary | ICD-10-CM

## 2020-10-09 DIAGNOSIS — G43109 Migraine with aura, not intractable, without status migrainosus: Secondary | ICD-10-CM | POA: Diagnosis not present

## 2020-10-09 LAB — URINALYSIS, ROUTINE W REFLEX MICROSCOPIC
Bilirubin, UA: NEGATIVE
Glucose, UA: NEGATIVE
Ketones, UA: NEGATIVE
Nitrite, UA: NEGATIVE
Protein,UA: NEGATIVE
Specific Gravity, UA: 1.02 (ref 1.005–1.030)
Urobilinogen, Ur: 0.2 mg/dL (ref 0.2–1.0)
pH, UA: 6 (ref 5.0–7.5)

## 2020-10-09 LAB — MICROSCOPIC EXAMINATION

## 2020-10-09 MED ORDER — NITROFURANTOIN MONOHYD MACRO 100 MG PO CAPS: 100.0000 mg | ORAL_CAPSULE | Freq: Two times a day (BID) | ORAL | 0 refills | Status: AC

## 2020-10-09 NOTE — Progress Notes (Signed)
BP 94/68   Pulse 83   Temp 98.9 F (37.2 C) (Oral)   Wt 98 lb 6.4 oz (44.6 kg)   SpO2 98%   BMI 16.89 kg/m    Subjective:    Patient ID: Megan Bentley, female    DOB: 1986/02/05, 35 y.o.   MRN: [id number]  HPI: Megan Bentley is a 35 y.o. female  Chief Complaint  Patient presents with  . Urinary Tract Infection    Patient states she became symptomatic about 2 weeks ago and states she is having urinary frequency and burning.   . Migraine    Patient states the medications the neurologist prescribed her have helped with her migraines.    MIGRAINES Seeing neurology at this time for migraines and chronic anxiety issues, Dr. Malvin Johns, last visit 08/14/20.  Currently taking Abilify 15 MG QHS, Nortiptyline 20 MG QHS and Klonopin 0.25 MG BID.  She reports no further migraines and anxiety is improved with Abilify + Klonopin as needed.  Is attending counseling every Friday. Duration: chronic Onset: gradual Severity: mild Frequency: none Location: none Headache duration: none at this time Radiation: no Time of day headache occurs: varies Alleviating factors: Nortriptyline Aggravating factors: unknown Headache status at time of visit: asymptomatic Treatments attempted: Treatments attempted: Nortriptyline Aura: yes Nausea:  yes Vomiting: no Photophobia:  no Phonophobia:  no Effect on social functioning:  no Numbers of missed days of school/work each month: 0 Confusion:  no Gait disturbance/ataxia:  no Behavioral changes:  no Fevers:  no   URINARY SYMPTOMS Started about 2 weeks ago.  She is followed by GYN at this time. Dysuria: burning Urinary frequency: yes Urgency: yes Small volume voids: yes Symptom severity: yes Urinary incontinence: no Foul odor: yes Hematuria: no Abdominal pain: no Back pain: no Suprapubic pain/pressure: no Flank pain: no Fever:  no Vomiting: no Relief with cranberry juice: no Relief with pyridium: some relief Status: stable Previous  urinary tract infection: yes Recurrent urinary tract infection: no Sexual activity: No sexually active/monogomous/practicing safe sex History of sexually transmitted disease: no Treatments attempted: cranberry and increasing fluids   Relevant past medical, surgical, family and social history reviewed and updated as indicated. Interim medical history since our last visit reviewed. Allergies and medications reviewed and updated.  Review of Systems  Constitutional: Negative for activity change, appetite change, diaphoresis, fatigue and fever.  Respiratory: Negative for cough, chest tightness, shortness of breath and wheezing.   Cardiovascular: Negative for chest pain, palpitations and leg swelling.  Gastrointestinal: Negative.   Genitourinary: Positive for decreased urine volume, dysuria, frequency and urgency. Negative for hematuria and vaginal discharge.  Neurological: Positive for headaches. Negative for dizziness, syncope, weakness, light-headedness and numbness.  Psychiatric/Behavioral: Negative.     Per HPI unless specifically indicated above     Objective:    BP 94/68   Pulse 83   Temp 98.9 F (37.2 C) (Oral)   Wt 98 lb 6.4 oz (44.6 kg)   SpO2 98%   BMI 16.89 kg/m   Wt Readings from Last 3 Encounters:  10/09/20 98 lb 6.4 oz (44.6 kg)  08/27/20 106 lb (48.1 kg)  06/26/20 103 lb 12.8 oz (47.1 kg)    Physical Exam Vitals and nursing note reviewed.  Constitutional:      General: She is awake. She is not in acute distress.    Appearance: She is well-developed and well-groomed. She is not ill-appearing or toxic-appearing.  HENT:     Head: Normocephalic.  Right Ear: Hearing, tympanic membrane, ear canal and external ear normal.     Left Ear: Hearing, tympanic membrane, ear canal and external ear normal.  Eyes:     General: Lids are normal.        Right eye: No discharge.        Left eye: No discharge.     Extraocular Movements: Extraocular movements intact.      Conjunctiva/sclera: Conjunctivae normal.     Pupils: Pupils are equal, round, and reactive to light.     Visual Fields: Right eye visual fields normal and left eye visual fields normal.  Neck:     Thyroid: No thyromegaly.     Vascular: No carotid bruit or JVD.  Cardiovascular:     Rate and Rhythm: Normal rate and regular rhythm.     Heart sounds: Normal heart sounds. No murmur heard. No gallop.   Pulmonary:     Effort: Pulmonary effort is normal.     Breath sounds: Normal breath sounds.  Abdominal:     General: Bowel sounds are normal.     Palpations: Abdomen is soft. There is no hepatomegaly or splenomegaly.  Musculoskeletal:     Cervical back: Normal range of motion and neck supple.     Right lower leg: No edema.     Left lower leg: No edema.  Lymphadenopathy:     Cervical: No cervical adenopathy.  Skin:    General: Skin is warm and dry.  Neurological:     Mental Status: She is alert and oriented to person, place, and time.     Cranial Nerves: Cranial nerves are intact.     Sensory: Sensation is intact.     Motor: Motor function is intact.     Coordination: Coordination is intact.     Gait: Gait is intact.     Deep Tendon Reflexes: Reflexes are normal and symmetric.     Reflex Scores:      Brachioradialis reflexes are 2+ on the right side and 2+ on the left side.      Patellar reflexes are 2+ on the right side and 2+ on the left side. Psychiatric:        Attention and Perception: Attention normal.        Mood and Affect: Mood normal.        Speech: Speech normal.        Behavior: Behavior normal. Behavior is cooperative.        Thought Content: Thought content normal.    Results for orders placed or performed during the hospital encounter of 09/18/20  SARS CORONAVIRUS 2 (TAT 6-24 HRS) Nasopharyngeal Nasopharyngeal Swab   Specimen: Nasopharyngeal Swab  Result Value Ref Range   SARS Coronavirus 2 NEGATIVE NEGATIVE  CBC  Result Value Ref Range   WBC 6.8 4.0 - 10.5  K/uL   RBC 3.83 (L) 3.87 - 5.11 MIL/uL   Hemoglobin 12.6 12.0 - 15.0 g/dL   HCT 16.1 09.6 - 04.5 %   MCV 96.6 80.0 - 100.0 fL   MCH 32.9 26.0 - 34.0 pg   MCHC 34.1 30.0 - 36.0 g/dL   RDW 40.9 81.1 - 91.4 %   Platelets 203 150 - 400 K/uL   nRBC 0.0 0.0 - 0.2 %  Basic metabolic panel  Result Value Ref Range   Sodium 134 (L) 135 - 145 mmol/L   Potassium 4.0 3.5 - 5.1 mmol/L   Chloride 98 98 - 111 mmol/L   CO2 27 22 -  32 mmol/L   Glucose, Bld 109 (H) 70 - 99 mg/dL   BUN 12 6 - 20 mg/dL   Creatinine, Ser 1.94 0.44 - 1.00 mg/dL   Calcium 9.3 8.9 - 17.4 mg/dL   GFR, Estimated >08 >14 mL/min   Anion gap 9 5 - 15  Type and screen [facility]  Result Value Ref Range   ABO/RH(D) O POS    Antibody Screen NEG    Sample Expiration 10/02/2020,2359    Extend sample reason      NO TRANSFUSIONS OR PREGNANCY IN THE PAST 3 MONTHS Performed at [facility], [address] [place], [place] [address]       Assessment & Plan:   Problem List Items Addressed This Visit      Cardiovascular and Mediastinum   Migraine with aura and without status migrainosus, not intractable - Primary    Chronic with improved symptoms on current medication regimen.  Continue collaboration with neurology and current regimen as prescribed by them.  Return to office in 6 months for physical.        Other   Lower urinary tract symptoms    Acute x 2 weeks, UA noting 3+ LEUK, trace BLD, many bacteria.  Will send this for culture.  At this time since symptomatic for 2 weeks will send in Macrobid 100 MG BID x 5 days and await culture, discussed with patient if need to change regimen will alert her.  Return to office for worsening or ongoing symptoms.      Relevant Orders   Urinalysis, Routine w reflex microscopic   Urine Culture   Chronic anxiety    Chronic, ongoing, stable on current medication regimen as prescribed by neurology.  She is aware of long term risks of chronic benzo use  and aware this script needs to come from neurology.  Continue current regimen as prescribed by them.  Return to office in 6 months for physical.          Follow up plan: Return in about 6 months (around 04/10/2021) for ANNUAL PHYSICAL.

## 2020-10-09 NOTE — Patient Instructions (Signed)
Urinary Tract Infection, Adult A urinary tract infection (UTI) is an infection of any part of the urinary tract. The urinary tract includes:  The kidneys.  The ureters.  The bladder.  The urethra. These organs make, store, and get rid of pee (urine) in the body. What are the causes? This infection is caused by germs (bacteria) in your genital area. These germs grow and cause swelling (inflammation) of your urinary tract. What increases the risk? The following factors may make you more likely to develop this condition:  Using a small, thin tube (catheter) to drain pee.  Not being able to control when you pee or poop (incontinence).  Being female. If you are female, these things can increase the risk: ? Using these methods to prevent pregnancy:  A medicine that kills sperm (spermicide).  A device that blocks sperm (diaphragm). ? Having low levels of a female hormone (estrogen). ? Being pregnant. You are more likely to develop this condition if:  You have genes that add to your risk.  You are sexually active.  You take antibiotic medicines.  You have trouble peeing because of: ? A prostate that is bigger than normal, if you are female. ? A blockage in the part of your body that drains pee from the bladder. ? A kidney stone. ? A nerve condition that affects your bladder. ? Not getting enough to drink. ? Not peeing often enough.  You have other conditions, such as: ? Diabetes. ? A weak disease-fighting system (immune system). ? Sickle cell disease. ? Gout. ? Injury of the spine. What are the signs or symptoms? Symptoms of this condition include:  Needing to pee right away.  Peeing small amounts often.  Pain or burning when peeing.  Blood in the pee.  Pee that smells bad or not like normal.  Trouble peeing.  Pee that is cloudy.  Fluid coming from the vagina, if you are female.  Pain in the belly or lower back. Other symptoms include:  Vomiting.  Not  feeling hungry.  Feeling mixed up (confused). This may be the first symptom in older adults.  Being tired and grouchy (irritable).  A fever.  Watery poop (diarrhea). How is this treated?  Taking antibiotic medicine.  Taking other medicines.  Drinking enough water. In some cases, you may need to see a specialist. Follow these instructions at home: Medicines  Take over-the-counter and prescription medicines only as told by your doctor.  If you were prescribed an antibiotic medicine, take it as told by your doctor. Do not stop taking it even if you start to feel better. General instructions  Make sure you: ? Pee until your bladder is empty. ? Do not hold pee for a long time. ? Empty your bladder after sex. ? Wipe from front to back after peeing or pooping if you are a female. Use each tissue one time when you wipe.  Drink enough fluid to keep your pee pale yellow.  Keep all follow-up visits.   Contact a doctor if:  You do not get better after 1-2 days.  Your symptoms go away and then come back. Get help right away if:  You have very bad back pain.  You have very bad pain in your lower belly.  You have a fever.  You have chills.  You feeling like you will vomit or you vomit. Summary  A urinary tract infection (UTI) is an infection of any part of the urinary tract.  This condition is caused by   germs in your genital area.  There are many risk factors for a UTI.  Treatment includes antibiotic medicines.  Drink enough fluid to keep your pee pale yellow. This information is not intended to replace advice given to you by your health care provider. Make sure you discuss any questions you have with your health care provider. Document Revised: 01/11/2020 Document Reviewed: 01/11/2020 Elsevier Patient Education  2021 [employer]  

## 2020-10-09 NOTE — Assessment & Plan Note (Signed)
Chronic, ongoing, stable on current medication regimen as prescribed by neurology.  She is aware of long term risks of chronic benzo use and aware this script needs to come from neurology.  Continue current regimen as prescribed by them.  Return to office in 6 months for physical.

## 2020-10-09 NOTE — Assessment & Plan Note (Signed)
Chronic with improved symptoms on current medication regimen.  Continue collaboration with neurology and current regimen as prescribed by them.  Return to office in 6 months for physical.

## 2020-10-09 NOTE — Assessment & Plan Note (Signed)
Acute x 2 weeks, UA noting 3+ LEUK, trace BLD, many bacteria.  Will send this for culture.  At this time since symptomatic for 2 weeks will send in Macrobid 100 MG BID x 5 days and await culture, discussed with patient if need to change regimen will alert her.  Return to office for worsening or ongoing symptoms.

## 2020-10-09 NOTE — Telephone Encounter (Signed)
Sending to pool

## 2020-10-14 LAB — URINE CULTURE

## 2020-10-14 NOTE — Progress Notes (Signed)
Contacted via MyChart   Good afternoon Alea, final culture returned and did noted >100,000 growth with sensitivity to Macrobid.  Complete current antibiotic regimen and let me know if ongoing symptoms present. Any questions? Keep being awesome!!  Thank you for allowing me to participate in your care. Kindest regards, Angelo Prindle

## 2020-10-31 DIAGNOSIS — F331 Major depressive disorder, recurrent, moderate: Secondary | ICD-10-CM | POA: Diagnosis not present

## 2020-10-31 DIAGNOSIS — F411 Generalized anxiety disorder: Secondary | ICD-10-CM | POA: Diagnosis not present

## 2020-10-31 DIAGNOSIS — F4310 Post-traumatic stress disorder, unspecified: Secondary | ICD-10-CM | POA: Diagnosis not present

## 2020-11-21 DIAGNOSIS — F411 Generalized anxiety disorder: Secondary | ICD-10-CM | POA: Diagnosis not present

## 2020-11-21 DIAGNOSIS — F4310 Post-traumatic stress disorder, unspecified: Secondary | ICD-10-CM | POA: Diagnosis not present

## 2020-11-21 DIAGNOSIS — F331 Major depressive disorder, recurrent, moderate: Secondary | ICD-10-CM | POA: Diagnosis not present

## 2020-11-25 DIAGNOSIS — R519 Headache, unspecified: Secondary | ICD-10-CM | POA: Diagnosis not present

## 2020-11-25 DIAGNOSIS — R200 Anesthesia of skin: Secondary | ICD-10-CM | POA: Diagnosis not present

## 2020-11-25 DIAGNOSIS — R55 Syncope and collapse: Secondary | ICD-10-CM | POA: Diagnosis not present

## 2020-11-25 DIAGNOSIS — F419 Anxiety disorder, unspecified: Secondary | ICD-10-CM | POA: Diagnosis not present

## 2020-11-28 DIAGNOSIS — F331 Major depressive disorder, recurrent, moderate: Secondary | ICD-10-CM | POA: Diagnosis not present

## 2020-11-28 DIAGNOSIS — F4310 Post-traumatic stress disorder, unspecified: Secondary | ICD-10-CM | POA: Diagnosis not present

## 2020-11-28 DIAGNOSIS — F411 Generalized anxiety disorder: Secondary | ICD-10-CM | POA: Diagnosis not present

## 2020-12-05 DIAGNOSIS — F411 Generalized anxiety disorder: Secondary | ICD-10-CM | POA: Diagnosis not present

## 2020-12-12 DIAGNOSIS — F411 Generalized anxiety disorder: Secondary | ICD-10-CM | POA: Diagnosis not present

## 2020-12-20 DIAGNOSIS — R569 Unspecified convulsions: Secondary | ICD-10-CM | POA: Diagnosis not present

## 2020-12-31 DIAGNOSIS — R569 Unspecified convulsions: Secondary | ICD-10-CM | POA: Diagnosis not present

## 2021-01-01 DIAGNOSIS — F329 Major depressive disorder, single episode, unspecified: Secondary | ICD-10-CM | POA: Diagnosis not present

## 2021-01-05 ENCOUNTER — Other Ambulatory Visit: Payer: Self-pay | Admitting: Nurse Practitioner

## 2021-01-05 MED ORDER — CYCLOBENZAPRINE HCL 5 MG PO TABS: 5.0000 mg | ORAL_TABLET | Freq: Three times a day (TID) | ORAL | 1 refills | Status: DC | PRN

## 2021-01-06 ENCOUNTER — Telehealth: Payer: Self-pay

## 2021-01-06 ENCOUNTER — Telehealth (INDEPENDENT_AMBULATORY_CARE_PROVIDER_SITE_OTHER): Payer: Medicaid Other | Admitting: Nurse Practitioner

## 2021-01-06 ENCOUNTER — Encounter: Payer: Self-pay | Admitting: Nurse Practitioner

## 2021-01-06 DIAGNOSIS — M545 Low back pain, unspecified: Secondary | ICD-10-CM

## 2021-01-06 MED ORDER — TRAMADOL HCL 50 MG PO TABS: 50.0000 mg | ORAL_TABLET | Freq: Four times a day (QID) | ORAL | 0 refills | Status: AC | PRN

## 2021-01-06 NOTE — Patient Instructions (Signed)
Acute Back Pain, Adult Acute back pain is sudden and usually short-lived. It is often caused by an injury to the muscles and tissues in the back. The injury may result from: A muscle or ligament getting overstretched or torn (strained). Ligaments are tissues that connect bones to each other. Lifting something improperly can cause a back strain. Wear and tear (degeneration) of the spinal disks. Spinal disks are circular tissue that provide cushioning between the bones of the spine (vertebrae). Twisting motions, such as while playing sports or doing yard work. A hit to the back. Arthritis. You may have a physical exam, lab tests, and imaging tests to find the cause ofyour pain. Acute back pain usually goes away with rest and home care. Follow these instructions at home: Managing pain, stiffness, and swelling Treatment may include medicines for pain and inflammation that are taken by mouth or applied to the skin, prescription pain medicine, or muscle relaxants. Take over-the-counter and prescription medicines only as told by your health care provider. Your health care provider may recommend applying ice during the first 24-48 hours after your pain starts. To do this: Put ice in a plastic bag. Place a towel between your skin and the bag. Leave the ice on for 20 minutes, 2-3 times a day. If directed, apply heat to the affected area as often as told by your health care provider. Use the heat source that your health care provider recommends, such as a moist heat pack or a heating pad. Place a towel between your skin and the heat source. Leave the heat on for 20-30 minutes. Remove the heat if your skin turns bright red. This is especially important if you are unable to feel pain, heat, or cold. You have a greater risk of getting burned. Activity  Do not stay in bed. Staying in bed for more than 1-2 days can delay your recovery. Sit up and stand up straight. Avoid leaning forward when you sit or  hunching over when you stand. If you work at a desk, sit close to it so you do not need to lean over. Keep your chin tucked in. Keep your neck drawn back, and keep your elbows bent at a 90-degree angle (right angle). Sit high and close to the steering wheel when you drive. Add lower back (lumbar) support to your car seat, if needed. Take short walks on even surfaces as soon as you are able. Try to increase the length of time you walk each day. Do not sit, drive, or stand in one place for more than 30 minutes at a time. Sitting or standing for long periods of time can put stress on your back. Do not drive or use heavy machinery while taking prescription pain medicine. Use proper lifting techniques. When you bend and lift, use positions that put less stress on your back: Bend your knees. Keep the load close to your body. Avoid twisting. Exercise regularly as told by your health care provider. Exercising helps your back heal faster and helps prevent back injuries by keeping muscles strong and flexible. Work with a physical therapist to make a safe exercise program, as recommended by your health care provider. Do any exercises as told by your physical therapist.  Lifestyle Maintain a healthy weight. Extra weight puts stress on your back and makes it difficult to have good posture. Avoid activities or situations that make you feel anxious or stressed. Stress and anxiety increase muscle tension and can make back pain worse. Learn ways to manage   anxiety and stress, such as through exercise. General instructions Sleep on a firm mattress in a comfortable position. Try lying on your side with your knees slightly bent. If you lie on your back, put a pillow under your knees. Follow your treatment plan as told by your health care provider. This may include: Cognitive or behavioral therapy. Acupuncture or massage therapy. Meditation or yoga. Contact a health care provider if: You have pain that is not  relieved with rest or medicine. You have increasing pain going down into your legs or buttocks. Your pain does not improve after 2 weeks. You have pain at night. You lose weight without trying. You have a fever or chills. Get help right away if: You develop new bowel or bladder control problems. You have unusual weakness or numbness in your arms or legs. You develop nausea or vomiting. You develop abdominal pain. You feel faint. Summary Acute back pain is sudden and usually short-lived. Use proper lifting techniques. When you bend and lift, use positions that put less stress on your back. Take over-the-counter and prescription medicines and apply heat or ice as directed by your health care provider. This information is not intended to replace advice given to you by your health care provider. Make sure you discuss any questions you have with your healthcare provider. Document Revised: 02/19/2020 Document Reviewed: 02/22/2020 Elsevier Patient Education  2022 [employer]  

## 2021-01-06 NOTE — Telephone Encounter (Signed)
Pt scheduled by pec on 01/29/2021

## 2021-01-06 NOTE — Telephone Encounter (Signed)
-----   Message from Marjie Skiff, NP sent at 01/06/2021  9:24 AM EDT ----- 3 week follow-up back pain

## 2021-01-06 NOTE — Assessment & Plan Note (Signed)
Acute episode of back pain after injury -- at this time recommend continue heat and Flexeril at home + rest and heat.  Would benefit from Icy/Hot Lidocaine patches OTC.  Send in 5 day supply Tramadol for acute pain, discussed with patient and she is aware not to take at same time as benzo.  Recommend if worsening pain return to office or go immediately to [facility] walk-in.  May benefit from imaging if ongoing pain.  Return in 3 weeks for office visit.

## 2021-01-06 NOTE — Progress Notes (Signed)
BP (!) 103/56   Pulse (!) 132    Subjective:    Patient ID: Megan Bentley, female    DOB: 1985/10/02, 35 y.o.   MRN: [id number]  HPI: Megan Bentley is a 34 y.o. 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 Patient states it happened on Sunday evening. Patient states she hasn't been able to lift her eight month daughter. Patient states she can't lay on her back and she has been using a heating pad. Patient states the muscle relaxer haven't helped.     This visit was completed via video visit through MyChart due to the restrictions of the COVID-19 pandemic. All issues as above were discussed and addressed. Physical exam was done as above through visual confirmation on video through MyChart. If it was felt that the patient should be evaluated in the office, they were directed there. The patient verbally consented to this visit. Location of the patient: home Location of the provider: work Those involved with this call:  Provider: Aura Dials, DNP CMA: Malen Gauze, CMA Front Desk/Registration: Beverely Pace  Time spent on call:  21 minutes with patient face to face via video conference. More than 50% of this time was spent in counseling and coordination of care. 15 minutes total spent in review of patient's record and preparation of their chart.  I verified patient identity using two factors (patient name and date of birth). Patient consents verbally to being seen via telemedicine visit today.    BACK PAIN Presents for back pain, she sent MyChart message yesterday to provider requesting refill on muscle relaxer, which was sent, and for some Tramadol -- which she was made aware she would need appointment for.  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  Reports she heard a pop -- happened on Sunday.  Can not lift up her eight-month old right now and can not lie on back due to pain.  Is using heating pad.  Muscle relaxer is not helping.  On review PDMP last Klonopin fill was on 12/20/20 for #60 tablets (30 day supply) and last Norco fill was 08/19/20 for #20 (5 day supply). Duration: days Mechanism of injury: lifting Location: bilateral and low back -- right side Onset: sudden Severity: 7/10 Quality: dull, aching, and throbbing Frequency: constant Radiation: L leg above the knee Aggravating factors: lifting and movement Alleviating factors: heat Status: fluctuating Treatments attempted: rest, heat, APAP, and ibuprofen  Relief with NSAIDs?: No Nighttime pain:  no Paresthesias / decreased sensation:  no Bowel / bladder incontinence:  no Fevers:  no Dysuria / urinary frequency:  no   Relevant past medical, surgical, family and social history reviewed and updated as indicated. Interim medical history since our last visit reviewed. Allergies and medications reviewed and updated.  Review of Systems  Constitutional:  Negative for activity change, appetite change, diaphoresis, fatigue and fever.  Respiratory:  Negative for cough, chest tightness and shortness of breath.   Cardiovascular:  Negative for chest pain, palpitations and leg swelling.  Musculoskeletal:  Positive for back pain.  Neurological: Negative.   Psychiatric/Behavioral: Negative.     Per HPI unless specifically indicated above  Objective:    BP (!) 103/56   Pulse (!) 132   Wt Readings from Last 3 Encounters:  10/09/20 98 lb 6.4 oz (44.6 kg)  08/27/20 106 lb (48.1 kg)  06/26/20 103 lb 12.8 oz (47.1 kg)    Physical Exam Vitals and nursing note reviewed.  Constitutional:      General: She is awake. She is not in acute distress.    Appearance: She is well-developed and well-groomed. She is not ill-appearing or  toxic-appearing.     Comments: Laying in bed, appears in discomfort.  HENT:     Head: Normocephalic.     Right Ear: Hearing normal.     Left Ear: Hearing normal.  Eyes:     General: Lids are normal.        Right eye: No discharge.        Left eye: No discharge.     Conjunctiva/sclera: Conjunctivae normal.  Pulmonary:     Effort: Pulmonary effort is normal. No accessory muscle usage or respiratory distress.  Musculoskeletal:     Cervical back: Normal range of motion.  Neurological:     Mental Status: She is alert and oriented to person, place, and time.  Psychiatric:        Attention and Perception: Attention normal.        Mood and Affect: Mood normal.        Behavior: Behavior normal. Behavior is cooperative.        Thought Content: Thought content normal.        Judgment: Judgment normal.    Results for orders placed or performed in visit on 10/09/20  Urine Culture   Specimen: Urine   UR  Result Value Ref Range   Urine Culture, Routine Final report (A)    Organism ID, Bacteria Escherichia coli (A)    ORGANISM ID, BACTERIA Comment    Antimicrobial Susceptibility Comment   Microscopic Examination   Urine  Result Value Ref Range   WBC, UA 11-30 (A) 0 - 5 /hpf   RBC 0-2 0 - 2 /hpf   Epithelial Cells (non renal) 0-10 0 - 10 /hpf   Mucus, UA Present (A) Not Estab.   Bacteria, UA Many (A) None seen/Few  Urinalysis, Routine w reflex microscopic  Result Value Ref Range   Specific Gravity, UA 1.020 1.005 - 1.030   pH, UA 6.0 5.0 - 7.5   Color, UA Yellow Yellow   Appearance Ur Cloudy (A) Clear   Leukocytes,UA 3+ (A) Negative   Protein,UA Negative Negative/Trace   Glucose, UA Negative Negative   Ketones, UA Negative Negative   RBC, UA Trace (A) Negative   Bilirubin, UA Negative Negative   Urobilinogen, Ur 0.2 0.2 - 1.0 mg/dL   Nitrite, UA Negative Negative   Microscopic Examination See below:       Assessment & Plan:   Problem List Items Addressed This Visit        Other   Back pain    Acute episode of back pain after injury -- at this time recommend continue heat and Flexeril at home + rest and heat.  Would benefit from Icy/Hot Lidocaine patches OTC.  Send in 5 day supply Tramadol for acute pain, discussed with patient and she is aware not to take at same time as benzo.  Recommend if worsening pain return to office or go immediately to [facility] walk-in.  May benefit from imaging if ongoing pain.  Return in 3 weeks for office visit.  Relevant Medications   traMADol (ULTRAM) 50 MG tablet   I discussed the assessment and treatment plan with the patient. The patient was provided an opportunity to ask questions and all were answered. The patient agreed with the plan and demonstrated an understanding of the instructions.   The patient was advised to call back or seek an in-person evaluation if the symptoms worsen or if the condition fails to improve as anticipated.   I provided 21+ minutes of time during this encounter.   Follow up plan: Return in about 3 weeks (around 01/27/2021) for Bakc pain.

## 2021-01-06 NOTE — Telephone Encounter (Signed)
UNABLE TO LVM TO MAKE THIS APT PHONE CONNECTION ISSUES

## 2021-01-08 DIAGNOSIS — R55 Syncope and collapse: Secondary | ICD-10-CM | POA: Diagnosis not present

## 2021-01-08 DIAGNOSIS — M545 Low back pain, unspecified: Secondary | ICD-10-CM | POA: Diagnosis not present

## 2021-01-08 DIAGNOSIS — S39012A Strain of muscle, fascia and tendon of lower back, initial encounter: Secondary | ICD-10-CM | POA: Diagnosis not present

## 2021-01-14 ENCOUNTER — Emergency Department: Payer: Medicaid Other

## 2021-01-14 ENCOUNTER — Other Ambulatory Visit: Payer: Self-pay

## 2021-01-14 ENCOUNTER — Ambulatory Visit: Payer: Self-pay

## 2021-01-14 ENCOUNTER — Inpatient Hospital Stay
Admission: EM | Admit: 2021-01-14 | Discharge: 2021-01-18 | DRG: 872 | Disposition: A | Discharge status: Home / Self Care | Payer: Medicaid Other | Attending: Internal Medicine | Admitting: Internal Medicine

## 2021-01-14 DIAGNOSIS — Z820 Family history of epilepsy and other diseases of the nervous system: Secondary | ICD-10-CM | POA: Diagnosis not present

## 2021-01-14 DIAGNOSIS — R519 Headache, unspecified: Secondary | ICD-10-CM

## 2021-01-14 DIAGNOSIS — Z880 Allergy status to penicillin: Secondary | ICD-10-CM

## 2021-01-14 DIAGNOSIS — F32A Depression, unspecified: Secondary | ICD-10-CM | POA: Diagnosis present

## 2021-01-14 DIAGNOSIS — X500XXA Overexertion from strenuous movement or load, initial encounter: Secondary | ICD-10-CM

## 2021-01-14 DIAGNOSIS — R197 Diarrhea, unspecified: Secondary | ICD-10-CM | POA: Diagnosis not present

## 2021-01-14 DIAGNOSIS — Z882 Allergy status to sulfonamides status: Secondary | ICD-10-CM | POA: Diagnosis not present

## 2021-01-14 DIAGNOSIS — Z825 Family history of asthma and other chronic lower respiratory diseases: Secondary | ICD-10-CM

## 2021-01-14 DIAGNOSIS — A419 Sepsis, unspecified organism: Secondary | ICD-10-CM | POA: Diagnosis present

## 2021-01-14 DIAGNOSIS — Z833 Family history of diabetes mellitus: Secondary | ICD-10-CM | POA: Diagnosis not present

## 2021-01-14 DIAGNOSIS — D649 Anemia, unspecified: Secondary | ICD-10-CM | POA: Diagnosis not present

## 2021-01-14 DIAGNOSIS — Z818 Family history of other mental and behavioral disorders: Secondary | ICD-10-CM | POA: Diagnosis not present

## 2021-01-14 DIAGNOSIS — Z8249 Family history of ischemic heart disease and other diseases of the circulatory system: Secondary | ICD-10-CM

## 2021-01-14 DIAGNOSIS — Y93F2 Activity, caregiving, lifting: Secondary | ICD-10-CM

## 2021-01-14 DIAGNOSIS — N390 Urinary tract infection, site not specified: Secondary | ICD-10-CM

## 2021-01-14 DIAGNOSIS — Z79899 Other long term (current) drug therapy: Secondary | ICD-10-CM

## 2021-01-14 DIAGNOSIS — R112 Nausea with vomiting, unspecified: Secondary | ICD-10-CM | POA: Diagnosis not present

## 2021-01-14 DIAGNOSIS — N200 Calculus of kidney: Secondary | ICD-10-CM | POA: Diagnosis present

## 2021-01-14 DIAGNOSIS — G629 Polyneuropathy, unspecified: Secondary | ICD-10-CM | POA: Diagnosis not present

## 2021-01-14 DIAGNOSIS — Y990 Civilian activity done for income or pay: Secondary | ICD-10-CM | POA: Diagnosis not present

## 2021-01-14 DIAGNOSIS — M545 Low back pain, unspecified: Secondary | ICD-10-CM | POA: Diagnosis not present

## 2021-01-14 DIAGNOSIS — E872 Acidosis: Secondary | ICD-10-CM | POA: Diagnosis not present

## 2021-01-14 DIAGNOSIS — N12 Tubulo-interstitial nephritis, not specified as acute or chronic: Secondary | ICD-10-CM | POA: Diagnosis not present

## 2021-01-14 DIAGNOSIS — R509 Fever, unspecified: Secondary | ICD-10-CM | POA: Diagnosis not present

## 2021-01-14 DIAGNOSIS — R000 Tachycardia, unspecified: Secondary | ICD-10-CM | POA: Diagnosis not present

## 2021-01-14 DIAGNOSIS — R636 Underweight: Secondary | ICD-10-CM | POA: Diagnosis present

## 2021-01-14 DIAGNOSIS — F419 Anxiety disorder, unspecified: Secondary | ICD-10-CM | POA: Diagnosis not present

## 2021-01-14 DIAGNOSIS — E876 Hypokalemia: Secondary | ICD-10-CM | POA: Diagnosis present

## 2021-01-14 DIAGNOSIS — Z20822 Contact with and (suspected) exposure to covid-19: Secondary | ICD-10-CM | POA: Diagnosis present

## 2021-01-14 DIAGNOSIS — Z681 Body mass index (BMI) 19 or less, adult: Secondary | ICD-10-CM

## 2021-01-14 DIAGNOSIS — A4151 Sepsis due to Escherichia coli [E. coli]: Secondary | ICD-10-CM | POA: Diagnosis not present

## 2021-01-14 DIAGNOSIS — N3001 Acute cystitis with hematuria: Secondary | ICD-10-CM | POA: Diagnosis not present

## 2021-01-14 LAB — COMPREHENSIVE METABOLIC PANEL
ALT: 33 U/L (ref 0–44)
AST: 26 U/L (ref 15–41)
Albumin: 3 g/dL — ABNORMAL LOW (ref 3.5–5.0)
Alkaline Phosphatase: 70 U/L (ref 38–126)
Anion gap: 14 (ref 5–15)
BUN: 16 mg/dL (ref 6–20)
CO2: 22 mmol/L (ref 22–32)
Calcium: 8.8 mg/dL — ABNORMAL LOW (ref 8.9–10.3)
Chloride: 99 mmol/L (ref 98–111)
Creatinine, Ser: 0.72 mg/dL (ref 0.44–1.00)
GFR, Estimated: >60 mL/min (ref >60)
Glucose, Bld: 133 mg/dL — ABNORMAL HIGH (ref 70–99)
Potassium: 3.7 mmol/L (ref 3.5–5.1)
Sodium: 135 mmol/L (ref 135–145)
Total Bilirubin: 0.8 mg/dL (ref 0.3–1.2)
Total Protein: 7 g/dL (ref 6.5–8.1)

## 2021-01-14 LAB — URINALYSIS, COMPLETE (UACMP) WITH MICROSCOPIC
Bilirubin Urine: NEGATIVE
Glucose, UA: NEGATIVE mg/dL
Ketones, ur: NEGATIVE mg/dL
Nitrite: NEGATIVE
Protein, ur: 30 mg/dL — AB
Specific Gravity, Urine: 1.024 (ref 1.005–1.030)
WBC, UA: >50 WBC/hpf — ABNORMAL HIGH (ref 0–5)
pH: 5 (ref 5.0–8.0)

## 2021-01-14 LAB — POC URINE PREG, ED: Preg Test, Ur: NEGATIVE

## 2021-01-14 LAB — RESP PANEL BY RT-PCR (FLU A&B, COVID) ARPGX2
Influenza A by PCR: NEGATIVE
Influenza B by PCR: NEGATIVE
SARS Coronavirus 2 by RT PCR: NEGATIVE

## 2021-01-14 LAB — CBC WITH DIFFERENTIAL/PLATELET
Abs Immature Granulocytes: 0.62 10*3/uL — ABNORMAL HIGH (ref 0.00–0.07)
Basophils Absolute: 0 10*3/uL (ref 0.0–0.1)
Basophils Relative: 0 %
Eosinophils Absolute: 0 10*3/uL (ref 0.0–0.5)
Eosinophils Relative: 0 %
HCT: 33.2 % — ABNORMAL LOW (ref 36.0–46.0)
Hemoglobin: 11.2 g/dL — ABNORMAL LOW (ref 12.0–15.0)
Immature Granulocytes: 3 %
Lymphocytes Relative: 8 %
Lymphs Abs: 1.8 10*3/uL (ref 0.7–4.0)
MCH: 32.7 pg (ref 26.0–34.0)
MCHC: 33.7 g/dL (ref 30.0–36.0)
MCV: 97.1 fL (ref 80.0–100.0)
Monocytes Absolute: 0.8 10*3/uL (ref 0.1–1.0)
Monocytes Relative: 4 %
Neutro Abs: 18.7 10*3/uL — ABNORMAL HIGH (ref 1.7–7.7)
Neutrophils Relative %: 85 %
Platelets: 485 10*3/uL — ABNORMAL HIGH (ref 150–400)
RBC: 3.42 MIL/uL — ABNORMAL LOW (ref 3.87–5.11)
RDW: 14.4 % (ref 11.5–15.5)
WBC: 22 10*3/uL — ABNORMAL HIGH (ref 4.0–10.5)
nRBC: 0 % (ref 0.0–0.2)

## 2021-01-14 LAB — LACTIC ACID, PLASMA
Lactic Acid, Venous: 3.2 mmol/L (ref 0.5–1.9)
Lactic Acid, Venous: 0.8 mmol/L (ref 0.5–1.9)

## 2021-01-14 MED ORDER — ONDANSETRON HCL 4 MG PO TABS
4.0000 mg | ORAL_TABLET | Freq: Four times a day (QID) | ORAL | Status: DC | PRN
  Administered 2021-01-17: 4 mg via ORAL
  Filled 2021-01-14: qty 1

## 2021-01-14 MED ORDER — ACETAMINOPHEN 325 MG PO TABS: 650.0000 mg | ORAL_TABLET | Freq: Four times a day (QID) | ORAL | Status: DC | PRN

## 2021-01-14 MED ORDER — ONDANSETRON HCL 4 MG/2ML IJ SOLN
4.0000 mg | Freq: Once | INTRAMUSCULAR | Status: AC
  Administered 2021-01-14: 4 mg via INTRAVENOUS
  Filled 2021-01-14: qty 2

## 2021-01-14 MED ORDER — ENOXAPARIN SODIUM 30 MG/0.3ML IJ SOSY
30.0000 mg | PREFILLED_SYRINGE | INTRAMUSCULAR | Status: DC
  Administered 2021-01-14: 30 mg via SUBCUTANEOUS
  Filled 2021-01-14 (×2): qty 0.3

## 2021-01-14 MED ORDER — SODIUM CHLORIDE 0.9 % IV SOLN: 1.0000 g | INTRAVENOUS | Status: DC

## 2021-01-14 MED ORDER — ONDANSETRON HCL 4 MG/2ML IJ SOLN
4.0000 mg | Freq: Four times a day (QID) | INTRAMUSCULAR | Status: DC | PRN
  Administered 2021-01-15 – 2021-01-18 (×4): 4 mg via INTRAVENOUS
  Filled 2021-01-14 (×4): qty 2

## 2021-01-14 MED ORDER — CLONAZEPAM 0.25 MG PO TBDP
0.2500 mg | ORAL_TABLET | Freq: Two times a day (BID) | ORAL | Status: DC
  Administered 2021-01-14 – 2021-01-18 (×8): 0.25 mg via ORAL
  Filled 2021-01-14 (×8): qty 1

## 2021-01-14 MED ORDER — ZOLPIDEM TARTRATE 5 MG PO TABS
5.0000 mg | ORAL_TABLET | Freq: Every evening | ORAL | Status: DC | PRN
  Administered 2021-01-14 – 2021-01-15 (×2): 5 mg via ORAL
  Filled 2021-01-14 (×2): qty 1

## 2021-01-14 MED ORDER — MORPHINE SULFATE (PF) 4 MG/ML IV SOLN: 4.0000 mg | Freq: Once | INTRAVENOUS | Status: DC

## 2021-01-14 MED ORDER — LACTATED RINGERS IV SOLN: INTRAVENOUS | Status: DC

## 2021-01-14 MED ORDER — LACTATED RINGERS IV BOLUS (SEPSIS)
250.0000 mL | Freq: Once | INTRAVENOUS | Status: AC
  Administered 2021-01-14: 250 mL via INTRAVENOUS

## 2021-01-14 MED ORDER — HYDROCODONE-ACETAMINOPHEN 5-325 MG PO TABS
1.0000 | ORAL_TABLET | ORAL | Status: DC | PRN
Start: 2021-01-14 — End: 2021-01-18
  Administered 2021-01-14 – 2021-01-18 (×19): 1 via ORAL
  Filled 2021-01-14 (×20): qty 1

## 2021-01-14 MED ORDER — LACTATED RINGERS IV BOLUS (SEPSIS)
1000.0000 mL | Freq: Once | INTRAVENOUS | Status: AC
  Administered 2021-01-14: 1000 mL via INTRAVENOUS

## 2021-01-14 MED ORDER — CYCLOBENZAPRINE HCL 10 MG PO TABS: 5.0000 mg | ORAL_TABLET | Freq: Three times a day (TID) | ORAL | Status: DC | PRN

## 2021-01-14 MED ORDER — NORTRIPTYLINE HCL 10 MG PO CAPS
20.0000 mg | ORAL_CAPSULE | Freq: Every day | ORAL | Status: DC
  Administered 2021-01-14 – 2021-01-17 (×4): 20 mg via ORAL
  Filled 2021-01-14 (×5): qty 2

## 2021-01-14 MED ORDER — ACETAMINOPHEN 650 MG RE SUPP: 650.0000 mg | Freq: Four times a day (QID) | RECTAL | Status: DC | PRN

## 2021-01-14 MED ORDER — SODIUM CHLORIDE 0.9 % IV SOLN
2.0000 g | INTRAVENOUS | Status: DC
  Administered 2021-01-14 – 2021-01-17 (×4): 2 g via INTRAVENOUS
  Filled 2021-01-14: qty 2
  Filled 2021-01-14 (×2): qty 20
  Filled 2021-01-14: qty 2

## 2021-01-14 MED ORDER — MORPHINE SULFATE (PF) 4 MG/ML IV SOLN
4.0000 mg | Freq: Once | INTRAVENOUS | Status: AC
  Administered 2021-01-14: 4 mg via INTRAVENOUS
  Filled 2021-01-14: qty 1

## 2021-01-14 MED ORDER — GABAPENTIN 100 MG PO CAPS
100.0000 mg | ORAL_CAPSULE | Freq: Two times a day (BID) | ORAL | Status: DC
  Administered 2021-01-14 – 2021-01-18 (×8): 100 mg via ORAL
  Filled 2021-01-14 (×8): qty 1

## 2021-01-14 MED ORDER — KETOROLAC TROMETHAMINE 30 MG/ML IJ SOLN
15.0000 mg | Freq: Four times a day (QID) | INTRAMUSCULAR | Status: DC | PRN
  Administered 2021-01-14 – 2021-01-18 (×9): 15 mg via INTRAVENOUS
  Filled 2021-01-14 (×9): qty 1

## 2021-01-14 MED ORDER — KETOROLAC TROMETHAMINE 30 MG/ML IJ SOLN
15.0000 mg | Freq: Once | INTRAMUSCULAR | Status: AC
  Administered 2021-01-14: 15 mg via INTRAVENOUS
  Filled 2021-01-14: qty 1

## 2021-01-14 MED ORDER — ACETAMINOPHEN 325 MG PO TABS
650.0000 mg | ORAL_TABLET | Freq: Once | ORAL | Status: AC
  Administered 2021-01-14: 650 mg via ORAL
  Filled 2021-01-14: qty 2

## 2021-01-14 MED ORDER — MORPHINE SULFATE (PF) 2 MG/ML IV SOLN
2.0000 mg | Freq: Once | INTRAVENOUS | Status: AC
  Administered 2021-01-14: 2 mg via INTRAVENOUS
  Filled 2021-01-14: qty 1

## 2021-01-14 NOTE — Progress Notes (Signed)
CODE SEPSIS - PHARMACY COMMUNICATION  **Broad Spectrum Antibiotics should be administered within 1 hour of Sepsis diagnosis**  Time Code Sepsis Called/Page Received: 1316  Antibiotics Ordered: ceftriaxone   Time of 1st antibiotic administration: 1359   Adah Stoneberg Rodriguez-Guzman PharmD, BCPS 01/14/2021 2:02 PM

## 2021-01-14 NOTE — Progress Notes (Signed)
PHARMACIST - PHYSICIAN COMMUNICATION  CONCERNING:  Enoxaparin (Lovenox) for DVT Prophylaxis    RECOMMENDATION: Patient was prescribed enoxaprin 40mg  q24 hours for VTE prophylaxis.   Filed Weights   01/14/21 1224  Weight: 40.8 kg (90 lb)    Body mass index is 15.45 kg/m.  Estimated Creatinine Clearance: 65 mL/min (by C-G formula based on SCr of 0.72 mg/dL).   Patient is candidate for enoxaparin 30mg  every 24 hours based on CrCl <70ml/min or Weight <45kg  DESCRIPTION: Pharmacy has adjusted enoxaparin dose per [facility] policy.  Patient is now receiving enoxaparin 30 mg every 24 hours    31m, PharmD, BCPS Clinical Pharmacist  01/14/2021 4:41 PM

## 2021-01-14 NOTE — Telephone Encounter (Signed)
Reason for Disposition  [1] MODERATE back pain (e.g., interferes with normal activities) AND [2] present > 3 days  Answer Assessment - Initial Assessment Questions 1. ONSET: "When did the pain begin?"      01/04/21 2. LOCATION: "Where does it hurt?" (upper, mid or lower back)     Low back 3. SEVERITY: "How bad is the pain?"  (e.g., Scale 1-10; mild, moderate, or severe)   - MILD (1-3): doesn't interfere with normal activities    - MODERATE (4-7): interferes with normal activities or awakens from sleep    - SEVERE (8-10): excruciating pain, unable to do any normal activities      Severe 4. PATTERN: "Is the pain constant?" (e.g., yes, no; constant, intermittent)      Constant 5. RADIATION: "Does the pain shoot into your legs or elsewhere?"     No 6. CAUSE:  "What do you think is causing the back pain?"      Hurt at work 7. BACK OVERUSE:  "Any recent lifting of heavy objects, strenuous work or exercise?"     Yes 8. MEDICATIONS: "What have you taken so far for the pain?" (e.g., nothing, acetaminophen, NSAIDS)     Prednisone,muscle relaxer, Meloxicam 9. NEUROLOGIC SYMPTOMS: "Do you have any weakness, numbness, or problems with bowel/bladder control?"     No 10. OTHER SYMPTOMS: "Do you have any other symptoms?" (e.g., fever, abdominal pain, burning with urination, blood in urine)       No appetite 11. PREGNANCY: "Is there any chance you are pregnant?" (e.g., yes, no; LMP)       No  Protocols used: Back Pain-A-AH

## 2021-01-14 NOTE — ED Triage Notes (Signed)
Pt to ER via POV with complaints of lower back pain that started 7/24. 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   Was seen by [facility] yesterday morning who called her in some meloxicam/ sent in for an MRI next Tuesday, advised pt to go to the emergency department due to a persistent fever of 102.2, no relief from pain in the last 5-7 days, lost 5lbs over that time. Reports diarrhea. Reports decreased urine output with foul odor.

## 2021-01-14 NOTE — Sepsis Progress Note (Signed)
eLink is monitoring this Code Sepsis. °

## 2021-01-14 NOTE — ED Notes (Signed)
Christina RN aware of assigned bed 

## 2021-01-14 NOTE — Telephone Encounter (Signed)
Pt. Saw PCP 01/06/21 with back pain. Has been to [facility] and she has MRI scheduled for next week. States medications given to her at [facility] are not helping her pain. No availability today in the practice. Pt. Will go back to [facility].

## 2021-01-14 NOTE — ED Notes (Signed)
Patient transported to CT 

## 2021-01-14 NOTE — ED Notes (Signed)
Notified MD of critical lactic 

## 2021-01-14 NOTE — H&P (Signed)
History and Physical    Megan Bentley RJJ:[id number] DOB: 1985-10-25 DOA: 01/14/2021  PCP: Marjie Skiff, NP   Patient coming from: Home  I have personally briefly reviewed patient's old medical records in [facility]  Chief Complaint: Right low back pain  HPI: Megan Bentley is a 35 y.o. 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  She rates her pain a 5 to 6 x 10 in intensity at its worst.  Pain is nonradiating and is worse with movement.  Over the last 7 days she developed fever, chills, dysuria, frequency, nausea and anorexia. She saw an orthopedic surgeon yesterday for evaluation of her low back pain and was given Mobic. She has also had loose watery stools but denies having any emesis, no cough, no shortness of breath, no chest pain, no dizziness, no lightheadedness, no headache, no palpitations, no diaphoresis, no leg swelling. Labs show sodium 135, potassium 3.7, chloride 99, bicarb 22, glucose 133, BUN 16, creatinine 0.72, calcium 8.8, alkaline phosphatase 70, albumin 3.0, AST 26, ALT 33, total protein 7.0, lactic acid 3.2 >> 0.8, white count 22,000, hemoglobin 11.2, hematocrit 33.2, MCV 97.1, RDW 14.4, platelet count 485 Respiratory viral panel is negative Renal stone study shows nonobstructing right renal calculi. Twelve-lead EKG reviewed by me shows sinus tachycardia with nonspecific T wave changes.   ED Course: Patient is a 35 year old female who presents to the for evaluation of fever, chills, right lower back pain as well as urinary symptoms. She was febrile when she arrived to the ER and was tachycardic.  Lactic acid was elevated at 3.2 and she has pyuria. She received sepsis fluids in the ER with a downward trend of her lactic acid level. She will be admitted to the hospital for further  evaluation.  Review of Systems: As per HPI otherwise all other systems reviewed and negative.    Past Medical History:  Diagnosis Date   Allergy    Anxiety    Arthritis    Depression    History of kidney stones    Kidney stone    Migraines    Pneumonia    PONV (postoperative nausea and vomiting)    Preeclampsia    Renal disorder    Reynolds syndrome [facility])     Past Surgical History:  Procedure Laterality Date   CYSTOSCOPY W/ RETROGRADES Bilateral 08/06/2015   Procedure: CYSTOSCOPY WITH RETROGRADE PYELOGRAM;  Surgeon: [name], MD;  Location: [facility] ORS;  Service: Urology;  Laterality: Bilateral;   CYSTOSCOPY/URETEROSCOPY/HOLMIUM LASER/STENT PLACEMENT Left 08/06/2015   Procedure: CYSTOSCOPY/URETEROSCOPY/HOLMIUM LASER/STENT PLACEMENT;  Surgeon: [name], MD;  Location: [facility] ORS;  Service: Urology;  Laterality: Left;     reports that she quit smoking about 19 months ago. Her smoking use included cigarettes. She has a 3.30 pack-year smoking history. She has never used smokeless tobacco. She reports previous drug use. Drug: Marijuana. She reports that she does not drink alcohol.  Allergies  Allergen Reactions   Sulfa Antibiotics Anaphylaxis   Amoxicillin Itching and Other (See Comments)    Burning skin    Family History  Problem Relation Age of Onset   Multiple sclerosis Other    Sjogren's syndrome Other        Aunt kim   Kidney Stones Other    Anxiety disorder Mother    Multiple sclerosis Mother  Raynaud syndrome Father    Anxiety disorder Father    Diabetes Father    Anxiety disorder Maternal Grandmother    COPD Maternal Grandmother    Heart disease Maternal Grandfather    Multiple sclerosis Sister    Lung disease Paternal Grandmother    Autoimmune disease Paternal Grandmother    Kidney disease Neg Hx    Bladder Cancer Neg Hx       Prior to Admission medications   Medication Sig Start Date End Date Taking? Authorizing Provider  clonazePAM (KLONOPIN)  0.5 MG tablet Take 0.5 tablets (0.25 mg total) by mouth 2 (two) times daily as needed for anxiety. Patient taking differently: Take 0.25 mg by mouth 2 (two) times daily. 07/16/20  Yes Cannady, Corrie DandyJolene T, NP  gabapentin (NEURONTIN) 100 MG capsule Take 100 mg twice a day for 1 week, then increase to 200 mg twice a day 11/25/20  Yes [provider]  nortriptyline (PAMELOR) 10 MG capsule Take 20 mg by mouth at bedtime. 07/17/20  Yes [provider]  cyclobenzaprine (FLEXERIL) 5 MG tablet Take 1 tablet (5 mg total) by mouth 3 (three) times daily as needed for muscle spasms. 01/05/21   Cannady, Corrie DandyJolene T, NP  ibuprofen (ADVIL) 600 MG tablet Take 1 tablet (600 mg total) by mouth every 6 (six) hours as needed. 08/06/20   Cannady, Corrie DandyJolene T, NP  methylPREDNISolone (MEDROL DOSEPAK) 4 MG TBPK tablet Take 4-24 mg by mouth daily. Patient not taking: Reported on 01/14/2021 01/08/21   [provider]  tiZANidine (ZANAFLEX) 4 MG tablet Take 4 mg by mouth every 8 (eight) hours. Patient not taking: Reported on 01/14/2021 01/08/21   [provider]    Physical Exam: Vitals:   01/14/21 1224 01/14/21 1226 01/14/21 1300 01/14/21 1518  BP:  (!) 230/209 108/69 103/61  Pulse:  (!) 102 (!) 106 79  Resp:  (!) 22 (!) 22 17  Temp:  99.9 F (37.7 C)    TempSrc:  Oral    SpO2:  99% 100% 97%  Weight: 40.8 kg     Height: 5\' 4"  (1.626 m)        Vitals:   01/14/21 1224 01/14/21 1226 01/14/21 1300 01/14/21 1518  BP:  (!) 230/209 108/69 103/61  Pulse:  (!) 102 (!) 106 79  Resp:  (!) 22 (!) 22 17  Temp:  99.9 F (37.7 C)    TempSrc:  Oral    SpO2:  99% 100% 97%  Weight: 40.8 kg     Height: 5\' 4"  (1.626 m)         Constitutional: Alert and oriented x 3 . Not in any apparent distress. Thin HEENT:      Head: Normocephalic and atraumatic.         Eyes: PERLA, EOMI, Conjunctivae are normal. Sclera is non-icteric.       Mouth/Throat: Mucous membranes are dry.       Neck: Supple with no signs  of meningismus. Cardiovascular: Tachycardic. No murmurs, gallops, or rubs. 2+ symmetrical distal pulses are present . No JVD. No LE edema Respiratory: Respiratory effort normal .Lungs sounds clear bilaterally. No wheezes, crackles, or rhonchi.  Gastrointestinal: Soft, non tender, and non distended with positive bowel sounds.  Genitourinary: No CVA tenderness. Musculoskeletal: Nontender with normal range of motion in all extremities. No cyanosis, or erythema of extremities. Neurologic:  Face is symmetric. Moving all extremities. No gross focal neurologic deficits . Skin: Skin is warm, dry.  No rash or ulcers Psychiatric:  Mood and affect are normal    Labs on Admission: I have personally reviewed following labs and imaging studies  CBC: Recent Labs  Lab 01/14/21 1213  WBC 22.0*  NEUTROABS 18.7*  HGB 11.2*  HCT 33.2*  MCV 97.1  PLT 485*   Basic Metabolic Panel: Recent Labs  Lab 01/14/21 1213  NA 135  K 3.7  CL 99  CO2 22  GLUCOSE 133*  BUN 16  CREATININE 0.72  CALCIUM 8.8*   GFR: Estimated Creatinine Clearance: 65 mL/min (by C-G formula based on SCr of 0.72 mg/dL). Liver Function Tests: Recent Labs  Lab 01/14/21 1213  AST 26  ALT 33  ALKPHOS 70  BILITOT 0.8  PROT 7.0  ALBUMIN 3.0*   No results for input(s): LIPASE, AMYLASE in the last 168 hours. No results for input(s): AMMONIA in the last 168 hours. Coagulation Profile: No results for input(s): INR, PROTIME in the last 168 hours. Cardiac Enzymes: No results for input(s): CKTOTAL, CKMB, CKMBINDEX, TROPONINI in the last 168 hours. BNP (last 3 results) No results for input(s): PROBNP in the last 8760 hours. HbA1C: No results for input(s): HGBA1C in the last 72 hours. CBG: No results for input(s): GLUCAP in the last 168 hours. Lipid Profile: No results for input(s): CHOL, HDL, LDLCALC, TRIG, CHOLHDL, LDLDIRECT in the last 72 hours. Thyroid Function Tests: No results for input(s): TSH, T4TOTAL, FREET4,  T3FREE, THYROIDAB in the last 72 hours. Anemia Panel: No results for input(s): VITAMINB12, FOLATE, FERRITIN, TIBC, IRON, RETICCTPCT in the last 72 hours. Urine analysis:    Component Value Date/Time   COLORURINE YELLOW (A) 01/14/2021 1227   APPEARANCEUR CLOUDY (A) 01/14/2021 1227   APPEARANCEUR Cloudy (A) 10/09/2020 0839   LABSPEC 1.024 01/14/2021 1227   LABSPEC 1.014 04/16/2013 0844   PHURINE 5.0 01/14/2021 1227   GLUCOSEU NEGATIVE 01/14/2021 1227   GLUCOSEU Negative 04/16/2013 0844   HGBUR MODERATE (A) 01/14/2021 1227   BILIRUBINUR NEGATIVE 01/14/2021 1227   BILIRUBINUR Negative 10/09/2020 0839   BILIRUBINUR Negative 04/16/2013 0844   KETONESUR NEGATIVE 01/14/2021 1227   PROTEINUR 30 (A) 01/14/2021 1227   NITRITE NEGATIVE 01/14/2021 1227   LEUKOCYTESUR SMALL (A) 01/14/2021 1227   LEUKOCYTESUR Negative 04/16/2013 0844    Radiological Exams on Admission: CT Renal Stone Study  Result Date: 01/14/2021 CLINICAL DATA:  Lower back pain since 01/04/2021, fever, diarrhea EXAM: CT ABDOMEN AND PELVIS WITHOUT CONTRAST TECHNIQUE: Multidetector CT imaging of the abdomen and pelvis was performed following the standard protocol without IV contrast. COMPARISON:  05/24/2020 FINDINGS: Lower chest: No acute pleural or parenchymal lung disease. Unenhanced CT was performed per clinician order. Lack of IV contrast limits sensitivity and specificity, especially for evaluation of abdominal/pelvic solid viscera. Hepatobiliary: No focal liver abnormality is seen. No gallstones, gallbladder wall thickening, or biliary dilatation. Pancreas: Unremarkable. No pancreatic ductal dilatation or surrounding inflammatory changes. Spleen: Normal in size without focal abnormality. Adrenals/Urinary Tract: There are 3 distinct nonobstructing less than 3 mm right renal calculi. No left-sided calculi. No obstructive uropathy within either kidney. The adrenals and bladder are unremarkable. Stomach/Bowel: No bowel obstruction or  ileus. The appendix, if still present, is not well visualized. No evidence of bowel wall thickening or inflammatory change. Vascular/Lymphatic: No significant vascular findings are present. No enlarged abdominal or pelvic lymph nodes. Reproductive: Uterus and bilateral adnexa are unremarkable. Other: Trace free fluid extending from the right upper quadrant into the right hemipelvis. No free intraperitoneal gas. No abdominal wall hernia. Musculoskeletal: No acute or destructive bony  lesions. Reconstructed images demonstrate no additional findings. IMPRESSION: 1. Trace free fluid within the right hemiabdomen and right lower pelvis, nonspecific. 2. Punctate nonobstructing right renal calculi. 3. Otherwise unremarkable exam limited by the lack of intravenous and oral contrast. Electronically Signed   By: Sharlet Salina M.D.   On: 01/14/2021 15:29     Assessment/Plan Principal Problem:   Sepsis secondary to UTI (HCC)     Sepsis from a urinary source As evidenced by fever with a T-max of 99, tachycardia, lactic acidosis, markedly elevated white cell count and pyuria Continue aggressive IV fluid resuscitation Treat patient empirically with Rocephin 1 g IV daily until urine culture results become available Follow-up results of blood cultures DVT prophylaxis: Lovenox  Code Status: full code  Family Communication: Greater than 50% of time was spent discussing patient's condition and plan of care with her at the bedside.  All questions and concerns have been addressed.  She verbalizes understanding and agrees with the plan.   Disposition Plan: Back to previous home environment Consults called: none  Status: At the time of admission, it appears that the appropriate admission status for this patient is inpatient. This is judged to be reasonable and necessary in order to provide the required intensity of service to ensure the patient's safety given the presenting symptoms, physical exam findings, and initial  radiographic and laboratory data in the context of their comorbid conditions. Patient requires inpatient status due to high intensity of service, high risk for further deterioration and high frequency of surveillance required.    Lucile Shutters MD [facility]     01/14/2021, 5:00 PM
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
## 2021-01-15 ENCOUNTER — Inpatient Hospital Stay: Payer: Medicaid Other

## 2021-01-15 DIAGNOSIS — F32A Depression, unspecified: Secondary | ICD-10-CM

## 2021-01-15 DIAGNOSIS — F419 Anxiety disorder, unspecified: Secondary | ICD-10-CM

## 2021-01-15 DIAGNOSIS — N3001 Acute cystitis with hematuria: Secondary | ICD-10-CM

## 2021-01-15 DIAGNOSIS — E876 Hypokalemia: Secondary | ICD-10-CM

## 2021-01-15 DIAGNOSIS — M545 Low back pain, unspecified: Secondary | ICD-10-CM

## 2021-01-15 LAB — BASIC METABOLIC PANEL
Anion gap: 7 (ref 5–15)
BUN: 11 mg/dL (ref 6–20)
CO2: 26 mmol/L (ref 22–32)
Calcium: 8.2 mg/dL — ABNORMAL LOW (ref 8.9–10.3)
Chloride: 105 mmol/L (ref 98–111)
Creatinine, Ser: 0.63 mg/dL (ref 0.44–1.00)
GFR, Estimated: >60 mL/min (ref >60)
Glucose, Bld: 95 mg/dL (ref 70–99)
Potassium: 3 mmol/L — ABNORMAL LOW (ref 3.5–5.1)
Sodium: 138 mmol/L (ref 135–145)

## 2021-01-15 LAB — PROTIME-INR
INR: 1.1 (ref 0.8–1.2)
Prothrombin Time: 14.2 seconds (ref 11.4–15.2)

## 2021-01-15 LAB — PROCALCITONIN: Procalcitonin: 0.53 ng/mL

## 2021-01-15 LAB — CORTISOL-AM, BLOOD: Cortisol - AM: 8.1 ug/dL (ref 6.7–22.6)

## 2021-01-15 LAB — HIV ANTIBODY (ROUTINE TESTING W REFLEX): HIV Screen 4th Generation wRfx: NONREACTIVE

## 2021-01-15 MED ORDER — POTASSIUM CHLORIDE CRYS ER 20 MEQ PO TBCR
40.0000 meq | EXTENDED_RELEASE_TABLET | Freq: Once | ORAL | Status: AC
  Administered 2021-01-15: 40 meq via ORAL
  Filled 2021-01-15: qty 2

## 2021-01-15 MED ORDER — ADULT MULTIVITAMIN W/MINERALS CH
1.0000 | ORAL_TABLET | Freq: Every day | ORAL | Status: DC
  Administered 2021-01-16 – 2021-01-18 (×3): 1 via ORAL
  Filled 2021-01-15 (×3): qty 1

## 2021-01-15 MED ORDER — LIDOCAINE 5 % EX PTCH
1.0000 | MEDICATED_PATCH | CUTANEOUS | Status: DC
  Administered 2021-01-15 – 2021-01-17 (×3): 1 via TRANSDERMAL
  Filled 2021-01-15 (×4): qty 1

## 2021-01-15 MED ORDER — ENSURE ENLIVE PO LIQD
237.0000 mL | Freq: Three times a day (TID) | ORAL | Status: DC
  Administered 2021-01-15 – 2021-01-18 (×7): 237 mL via ORAL

## 2021-01-15 NOTE — Progress Notes (Signed)
Initial Nutrition Assessment  DOCUMENTATION CODES:   Underweight  INTERVENTION:   Ensure Enlive po TID, each supplement provides 350 kcal and 20 grams of protein  Magic cup TID with meals, each supplement provides 290 kcal and 9 grams of protein  MVI po daily   Pt at high refeed risk; recommend monitor potassium, magnesium and phosphorus labs daily until stable  NUTRITION DIAGNOSIS:   Inadequate oral intake related to acute illness as evidenced by per patient/family report.  GOAL:   Patient will meet greater than or equal to 90% of their needs  MONITOR:   PO intake, Supplement acceptance, Labs, Weight trends, Skin, I & O's  REASON FOR ASSESSMENT:   Malnutrition Screening Tool    ASSESSMENT:   35 y.o. female with medical history significant for nephrolithiasis and depression who is admitted with back pain, sepsis and UTI.  Met with pt in room today. Pt reports decreased oral intake for several weeks pta r/t poor appetite. Pt reports that she has not been eating much pta as she just has no appetite. Pt reports a significant weight loss pta. Pt also reports diarrhea over the past two weeks but reports that she has diarrhea "more times than not". Pt reports that she sometimes sees undigested food in her stools; pt reports that her stool does sometimes float and that it does sometimes have a bad odor. Pt reports that her UBW is ~110lbs. Per chart, pt is down 33lbs(27%) over the past 8 months. Pt did deliver a baby in November. Per chart, pt weighed 94lbs in 2017, 92lbs in 2018 and 91lbs in 2020; pt's pre-pregnancy weight in 09/2019 was 92lbs. Pt ate 60% of her breakfast this morning and was eating food brought from outside the hospital for lunch. RD will add supplements and vitamins to help pt meet her estimated needs (prefers chocolate). Pt is likely at refeed risk.   Medications reviewed and include: lovenox, ceftriaxone   Labs reviewed: K 3.0(L) Wbc- 22.0(H)  NUTRITION -  FOCUSED PHYSICAL EXAM:  Flowsheet Row Most Recent Value  Orbital Region No depletion  Upper Arm Region Moderate depletion  Thoracic and Lumbar Region No depletion  Buccal Region No depletion  Temple Region No depletion  Clavicle Bone Region Mild depletion  Clavicle and Acromion Bone Region Mild depletion  Scapular Bone Region No depletion  Dorsal Hand No depletion  Patellar Region Mild depletion  Anterior Thigh Region Mild depletion  Posterior Calf Region Mild depletion  Edema (RD Assessment) None  Hair Reviewed  Eyes Reviewed  Mouth Reviewed  Skin Reviewed  Nails Reviewed   Diet Order:   Diet Order             DIET SOFT Room service appropriate? Yes; Fluid consistency: Thin  Diet effective now                  EDUCATION NEEDS:   Education needs have been addressed  Skin:  Skin Assessment: Reviewed RN Assessment  Last BM:  8/3  Height:   Ht Readings from Last 1 Encounters:  01/14/21 5' 4" (1.626 m)    Weight:   Wt Readings from Last 1 Encounters:  01/14/21 40.8 kg    Ideal Body Weight:  54.5 kg  BMI:  Body mass index is 15.45 kg/m.  Estimated Nutritional Needs:   Kcal:  1600-1800kcal/day  Protein:  80-90g/day  Fluid:  1.3-1.5L/day  Koleen Distance MS, RD, LDN Please refer to [facility] for RD and/or RD on-call/weekend/after hours pager

## 2021-01-15 NOTE — Progress Notes (Signed)
Patient ID: Megan Bentley, female   DOB: 07-30-1985, 35 y.o.   MRN: [id number] [facility] PROGRESS NOTE  Megan Bentley WUJ:[id number] DOB: 07-30-1985 DOA: 01/14/2021 PCP: Marjie Skiffannady, [name] T, NP  HPI/Subjective: Patient in a lot of pain.  She states the muscle relaxant and pain medications not helping very much.  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  She has been following at [facility] as outpatient and was set up for an MRI on Tuesday.  Ended up being admitted to the hospital with  Objective: Vitals:   01/15/21 0711 01/15/21 1537  BP: 115/74 114/76  Pulse: 84 73  Resp: 19 18  Temp: 99.2 F (37.3 C) 98.7 F (37.1 C)  SpO2: 100% 100%    Intake/Output Summary (Last 24 hours) at 01/15/2021 1704 Last data filed at 01/15/2021 1612 Gross per 24 hour  Intake 4151.28 ml  Output --  Net 4151.28 ml   Filed Weights   01/14/21 1224  Weight: 40.8 kg    ROS: Review of Systems  Respiratory:  Negative for shortness of breath.   Cardiovascular:  Negative for chest pain.  Gastrointestinal:  Negative for abdominal pain, nausea and vomiting.  Genitourinary:  Positive for dysuria.  Musculoskeletal:  Positive for back pain.  Exam: Physical Exam HENT:     Head: Normocephalic.     Mouth/Throat:     Pharynx: No oropharyngeal exudate.  Eyes:     General: Lids are normal.     Conjunctiva/sclera: Conjunctivae normal.     Pupils: Pupils are equal, round, and reactive to light.  Cardiovascular:     Rate and Rhythm: Normal rate and regular rhythm.     Heart sounds: Normal heart sounds, S1 normal and S2 normal.  Pulmonary:     Breath sounds: Normal breath sounds. No decreased breath sounds, wheezing, rhonchi or rales.  Abdominal:     Palpations: Abdomen is soft.     Tenderness: There is no abdominal tenderness.  Musculoskeletal:     Right lower leg: No swelling.     Left lower leg: No swelling.  Skin:    General: Skin is  warm.     Findings: No rash.  Neurological:     Mental Status: She is alert and oriented to person, place, and time.     Comments: Able to straight leg raise bilaterally.     Data Reviewed: Basic Metabolic Panel: Recent Labs  Lab 01/14/21 1213 01/15/21 0447  NA 135 138  K 3.7 3.0*  CL 99 105  CO2 22 26  GLUCOSE 133* 95  BUN 16 11  CREATININE 0.72 0.63  CALCIUM 8.8* 8.2*   Liver Function Tests: Recent Labs  Lab 01/14/21 1213  AST 26  ALT 33  ALKPHOS 70  BILITOT 0.8  PROT 7.0  ALBUMIN 3.0*    CBC: Recent Labs  Lab 01/14/21 1213  WBC 22.0*  NEUTROABS 18.7*  HGB 11.2*  HCT 33.2*  MCV 97.1  PLT 485*   BNP (last 3 results) Recent Labs    05/24/20 0939  BNP 8.8     Recent Results (from the past 240 hour(s))  Blood culture (routine x 2)     Status: None (Preliminary result)   Collection Time: 01/14/21 12:13 PM   Specimen: BLOOD  Result Value Ref Range Status   Specimen Description BLOOD RIGHT ANTECUBITAL  Final   Special Requests   Final    BOTTLES DRAWN AEROBIC  AND ANAEROBIC Blood Culture results may not be optimal due to an inadequate volume of blood received in culture bottles   Culture   Final    NO GROWTH < 24 HOURS Performed at [facility], [address] [place], [place] [address]    Report Status PENDING  Incomplete  Blood culture (routine x 2)     Status: None (Preliminary result)   Collection Time: 01/14/21  2:15 PM   Specimen: BLOOD  Result Value Ref Range Status   Specimen Description BLOOD RIGHT ANTECUBITAL  Final   Special Requests   Final    BOTTLES DRAWN AEROBIC AND ANAEROBIC Blood Culture adequate volume   Culture   Final    NO GROWTH < 24 HOURS Performed at [facility], [address] [place], [place] [address]    Report Status PENDING  Incomplete  Resp Panel by RT-PCR (Flu A&B, Covid) Urine, Clean Catch     Status: None   Collection Time: 01/14/21  2:15 PM   Specimen: Urine, Clean Catch;  Nasopharyngeal(NP) swabs in vial transport medium  Result Value Ref Range Status   SARS Coronavirus 2 by RT PCR NEGATIVE NEGATIVE Final    Comment: (NOTE) SARS-CoV-2 target nucleic acids are NOT DETECTED.  The SARS-CoV-2 RNA is generally detectable in upper respiratory specimens during the acute phase of infection. The lowest concentration of SARS-CoV-2 viral copies this assay can detect is 138 copies/mL. A negative result does not preclude SARS-Cov-2 infection and should not be used as the sole basis for treatment or other patient management decisions. A negative result may occur with  improper specimen collection/handling, submission of specimen other than nasopharyngeal swab, presence of viral mutation(s) within the areas targeted by this assay, and inadequate number of viral copies(<138 copies/mL). A negative result must be combined with clinical observations, patient history, and epidemiological information. The expected result is Negative.  Fact Sheet for Patients:  BloggerCourse.com  Fact Sheet for Healthcare Providers:  SeriousBroker.it  This test is no t yet approved or cleared by the [place] FDA and  has been authorized for detection and/or diagnosis of SARS-CoV-2 by FDA under an Emergency Use Authorization (EUA). This EUA will remain  in effect (meaning this test can be used) for the duration of the COVID-19 declaration under Section 564(b)(1) of the Act, 21 U.S.C.section 360bbb-3(b)(1), unless the authorization is terminated  or revoked sooner.       Influenza A by PCR NEGATIVE NEGATIVE Final   Influenza B by PCR NEGATIVE NEGATIVE Final    Comment: (NOTE) The Xpert Xpress SARS-CoV-2/FLU/RSV plus assay is intended as an aid in the diagnosis of influenza from Nasopharyngeal swab specimens and should not be used as a sole basis for treatment. Nasal washings and aspirates are unacceptable for Xpert Xpress  SARS-CoV-2/FLU/RSV testing.  Fact Sheet for Patients: BloggerCourse.com  Fact Sheet for Healthcare Providers: SeriousBroker.it  This test is not yet approved or cleared by the [place] FDA and has been authorized for detection and/or diagnosis of SARS-CoV-2 by FDA under an Emergency Use Authorization (EUA). This EUA will remain in effect (meaning this test can be used) for the duration of the COVID-19 declaration under Section 564(b)(1) of the Act, 21 U.S.C. section 360bbb-3(b)(1), unless the authorization is terminated or revoked.  Performed at [facility], [address] [place], [place] [address]      Studies: MR LUMBAR SPINE WO CONTRAST  Result Date: 01/15/2021 CLINICAL DATA:  Low back pain, trauma EXAM: MRI LUMBAR  SPINE WITHOUT CONTRAST TECHNIQUE: Multiplanar, multisequence MR imaging of the lumbar spine was performed. No intravenous contrast was administered. COMPARISON:  None. FINDINGS: Segmentation:  Standard. Alignment:  No significant listhesis. Vertebrae: Vertebral body heights are maintained. No marrow edema. No suspicious osseous lesion. Conus medullaris and cauda equina: Conus extends to the L1-L2 level. Conus and cauda equina appear normal. Paraspinal and other soft tissues: Unremarkable. Disc levels: Intervertebral disc heights and signal are maintained. There is no disc herniation. No canal or foraminal stenosis. IMPRESSION: Normal MRI of the lumbar spine. Electronically Signed   By: Guadlupe Spanish M.D.   On: 01/15/2021 15:53   CT Renal Stone Study  Result Date: 01/14/2021 CLINICAL DATA:  Lower back pain since 01/04/2021, fever, diarrhea EXAM: CT ABDOMEN AND PELVIS WITHOUT CONTRAST TECHNIQUE: Multidetector CT imaging of the abdomen and pelvis was performed following the standard protocol without IV contrast. COMPARISON:  05/24/2020 FINDINGS: Lower chest: No acute pleural or parenchymal lung disease.  Unenhanced CT was performed per clinician order. Lack of IV contrast limits sensitivity and specificity, especially for evaluation of abdominal/pelvic solid viscera. Hepatobiliary: No focal liver abnormality is seen. No gallstones, gallbladder wall thickening, or biliary dilatation. Pancreas: Unremarkable. No pancreatic ductal dilatation or surrounding inflammatory changes. Spleen: Normal in size without focal abnormality. Adrenals/Urinary Tract: There are 3 distinct nonobstructing less than 3 mm right renal calculi. No left-sided calculi. No obstructive uropathy within either kidney. The adrenals and bladder are unremarkable. Stomach/Bowel: No bowel obstruction or ileus. The appendix, if still present, is not well visualized. No evidence of bowel wall thickening or inflammatory change. Vascular/Lymphatic: No significant vascular findings are present. No enlarged abdominal or pelvic lymph nodes. Reproductive: Uterus and bilateral adnexa are unremarkable. Other: Trace free fluid extending from the right upper quadrant into the right hemipelvis. No free intraperitoneal gas. No abdominal wall hernia. Musculoskeletal: No acute or destructive bony lesions. Reconstructed images demonstrate no additional findings. IMPRESSION: 1. Trace free fluid within the right hemiabdomen and right lower pelvis, nonspecific. 2. Punctate nonobstructing right renal calculi. 3. Otherwise unremarkable exam limited by the lack of intravenous and oral contrast. Electronically Signed   By: Sharlet Salina M.D.   On: 01/14/2021 15:29    Scheduled Meds:  clonazePAM  0.25 mg Oral BID   enoxaparin (LOVENOX) injection  30 mg Subcutaneous Q24H   gabapentin  100 mg Oral BID   lidocaine  1 patch Transdermal Q24H   nortriptyline  20 mg Oral QHS   Continuous Infusions:  cefTRIAXone (ROCEPHIN)  IV Stopped (01/15/21 1610)    Assessment/Plan:  Clinical sepsis with fever, tachycardia lactic acidosis and leukocytosis.  Acute cystitis with  hematuria.  Continue Rocephin.  Added on a urine culture not sent from the ER.  Lactic acidosis improved with fluids.  Check white blood cell count tomorrow. Back pain.  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  Too low to be of the kidney.  Likely musculoskeletal.  Patient had an outpatient MRI which was ordered but wanted to get it done here.  MRI negative.  Continue Toradol and as needed pain medication.  Trial of Lidoderm patch. Hypokalemia.  Replace potassium Anxiety depression on clonazepam and nortriptyline        Code Status:     Code Status Orders  (From admission, onward)           Start     Ordered   01/14/21 1636  Full code  Continuous        01/14/21 1638  Code Status History     Date Active Date Inactive Code Status Order ID Comments User Context   01/25/2020 0813 01/26/2020 0211 Full Code [id number]  Farrel Conners, CNM Inpatient   01/14/2020 0858 01/14/2020 1539 Full Code [id number]  [name], CNM Inpatient      Family Communication: Fianc at bedside Disposition Plan: Status is: Inpatient  Dispo: The patient is from: Home              Anticipated d/c is to: Home              Patient currently on IV antibiotics for sepsis   Difficult to place patient.  No.  Antibiotics: Rocephin  Time spent: 27 minutes  Ramie Palladino [employer]

## 2021-01-16 DIAGNOSIS — R519 Headache, unspecified: Secondary | ICD-10-CM

## 2021-01-16 DIAGNOSIS — A4151 Sepsis due to Escherichia coli [E. coli]: Secondary | ICD-10-CM

## 2021-01-16 DIAGNOSIS — R112 Nausea with vomiting, unspecified: Secondary | ICD-10-CM

## 2021-01-16 LAB — CBC WITH DIFFERENTIAL/PLATELET
Abs Immature Granulocytes: 0.36 10*3/uL — ABNORMAL HIGH (ref 0.00–0.07)
Basophils Absolute: 0 10*3/uL (ref 0.0–0.1)
Basophils Relative: 0 %
Eosinophils Absolute: 0.1 10*3/uL (ref 0.0–0.5)
Eosinophils Relative: 1 %
HCT: 27.2 % — ABNORMAL LOW (ref 36.0–46.0)
Hemoglobin: 9.2 g/dL — ABNORMAL LOW (ref 12.0–15.0)
Immature Granulocytes: 4 %
Lymphocytes Relative: 19 %
Lymphs Abs: 1.7 10*3/uL (ref 0.7–4.0)
MCH: 32.5 pg (ref 26.0–34.0)
MCHC: 33.8 g/dL (ref 30.0–36.0)
MCV: 96.1 fL (ref 80.0–100.0)
Monocytes Absolute: 0.7 10*3/uL (ref 0.1–1.0)
Monocytes Relative: 8 %
Neutro Abs: 5.9 10*3/uL (ref 1.7–7.7)
Neutrophils Relative %: 68 %
Platelets: 473 10*3/uL — ABNORMAL HIGH (ref 150–400)
RBC: 2.83 MIL/uL — ABNORMAL LOW (ref 3.87–5.11)
RDW: 13.9 % (ref 11.5–15.5)
WBC: 8.8 10*3/uL (ref 4.0–10.5)
nRBC: 0 % (ref 0.0–0.2)

## 2021-01-16 LAB — BASIC METABOLIC PANEL
Anion gap: 6 (ref 5–15)
BUN: 15 mg/dL (ref 6–20)
CO2: 28 mmol/L (ref 22–32)
Calcium: 8.3 mg/dL — ABNORMAL LOW (ref 8.9–10.3)
Chloride: 103 mmol/L (ref 98–111)
Creatinine, Ser: 0.5 mg/dL (ref 0.44–1.00)
GFR, Estimated: >60 mL/min (ref >60)
Glucose, Bld: 90 mg/dL (ref 70–99)
Potassium: 4 mmol/L (ref 3.5–5.1)
Sodium: 137 mmol/L (ref 135–145)

## 2021-01-16 LAB — MAGNESIUM: Magnesium: 1.7 mg/dL (ref 1.7–2.4)

## 2021-01-16 MED ORDER — MAGNESIUM SULFATE 2 GM/50ML IV SOLN
2.0000 g | Freq: Once | INTRAVENOUS | Status: AC
  Administered 2021-01-16: 2 g via INTRAVENOUS
  Filled 2021-01-16: qty 50

## 2021-01-16 MED ORDER — BUTALBITAL-APAP-CAFFEINE 50-325-40 MG PO TABS
1.0000 | ORAL_TABLET | ORAL | Status: DC | PRN
  Administered 2021-01-17 – 2021-01-18 (×4): 1 via ORAL
  Filled 2021-01-16 (×4): qty 1

## 2021-01-16 MED ORDER — BUTALBITAL-APAP-CAFFEINE 50-325-40 MG PO TABS
1.0000 | ORAL_TABLET | ORAL | Status: AC
  Administered 2021-01-16: 1 via ORAL
  Filled 2021-01-16: qty 1

## 2021-01-16 NOTE — Progress Notes (Addendum)
Patient ID: Megan Bentley, female   DOB: Jul 25, 1985, 35 y.o.   MRN: [id number] [facility] PROGRESS NOTE  Megan Bentley ZLD:[id number] DOB: Jun 20, 1985 DOA: 01/14/2021 PCP: Megan Skiff, NP  HPI/Subjective: Patient stated she just had some nausea and vomiting.  Normally she does drink caffeine and has not been drinking here.  She still is having back pain.  The Lidoderm patch seemed to help a little bit but not completely.  Admitted with clinical sepsis secondary to acute cystitis.  Objective: Vitals:   01/16/21 0503 01/16/21 0755  BP: 107/75 118/80  Pulse: 71 77  Resp: 18   Temp: 98 F (36.7 C) 98.1 F (36.7 C)  SpO2: 100% 100%    Intake/Output Summary (Last 24 hours) at 01/16/2021 1349 Last data filed at 01/16/2021 1027 Gross per 24 hour  Intake 1625.58 ml  Output --  Net 1625.58 ml   Filed Weights   01/14/21 1224  Weight: 40.8 kg    ROS: Review of Systems  Respiratory:  Negative for shortness of breath.   Cardiovascular:  Negative for chest pain.  Gastrointestinal:  Positive for diarrhea, nausea and vomiting. Negative for abdominal pain.  Musculoskeletal:  Positive for back pain.  Exam: Physical Exam HENT:     Head: Normocephalic.     Mouth/Throat:     Pharynx: No oropharyngeal exudate.  Eyes:     General: Lids are normal.     Conjunctiva/sclera: Conjunctivae normal.  Cardiovascular:     Rate and Rhythm: Normal rate and regular rhythm.     Heart sounds: Normal heart sounds, S1 normal and S2 normal.  Pulmonary:     Breath sounds: No decreased breath sounds, wheezing, rhonchi or rales.  Abdominal:     Palpations: Abdomen is soft.     Tenderness: There is no abdominal tenderness.  Musculoskeletal:     Right lower leg: No swelling.     Left lower leg: No swelling.  Skin:    General: Skin is warm.     Findings: No rash.  Neurological:     Mental Status: She is alert and oriented to person, place, and time.     Data Reviewed: Basic Metabolic  Panel: Recent Labs  Lab 01/14/21 1213 01/15/21 0447 01/16/21 0458  NA 135 138 137  K 3.7 3.0* 4.0  CL 99 105 103  CO2 22 26 28   GLUCOSE 133* 95 90  BUN 16 11 15   CREATININE 0.72 0.63 0.50  CALCIUM 8.8* 8.2* 8.3*  MG  --   --  1.7   Liver Function Tests: Recent Labs  Lab 01/14/21 1213  AST 26  ALT 33  ALKPHOS 70  BILITOT 0.8  PROT 7.0  ALBUMIN 3.0*   CBC: Recent Labs  Lab 01/14/21 1213 01/16/21 0458  WBC 22.0* 8.8  NEUTROABS 18.7* 5.9  HGB 11.2* 9.2*  HCT 33.2* 27.2*  MCV 97.1 96.1  PLT 485* 473*    BNP (last 3 results) Recent Labs    05/24/20 0939  BNP 8.8     Recent Results (from the past 240 hour(s))  Blood culture (routine x 2)     Status: None (Preliminary result)   Collection Time: 01/14/21 12:13 PM   Specimen: BLOOD  Result Value Ref Range Status   Specimen Description BLOOD RIGHT ANTECUBITAL  Final   Special Requests   Final    BOTTLES DRAWN AEROBIC AND ANAEROBIC Blood Culture results may not be optimal due to an inadequate volume of blood received in culture  bottles   Culture   Final    NO GROWTH 2 DAYS Performed at [facility], [address] [place], [place] [address]    Report Status PENDING  Incomplete  Urine Culture     Status: Abnormal (Preliminary result)   Collection Time: 01/14/21 12:27 PM   Specimen: Urine, Clean Catch  Result Value Ref Range Status   Specimen Description   Final    URINE, CLEAN CATCH Performed at [facility], [address] [place], [place] [address]    Special Requests   Final    Normal Performed at [facility], [address] [place], [place] [address]    Culture (A)  Final    >=100,000 COLONIES/mL ESCHERICHIA COLI SUSCEPTIBILITIES TO FOLLOW Performed at [facility], [address], [place], [place] [address]    Report Status PENDING  Incomplete  Blood culture (routine x 2)     Status: None (Preliminary result)   Collection Time: 01/14/21  2:15 PM    Specimen: BLOOD  Result Value Ref Range Status   Specimen Description BLOOD RIGHT ANTECUBITAL  Final   Special Requests   Final    BOTTLES DRAWN AEROBIC AND ANAEROBIC Blood Culture adequate volume   Culture   Final    NO GROWTH 2 DAYS Performed at [facility], [address], [place], [place] [address]    Report Status PENDING  Incomplete  Resp Panel by RT-PCR (Flu A&B, Covid) Urine, Clean Catch     Status: None   Collection Time: 01/14/21  2:15 PM   Specimen: Urine, Clean Catch; Nasopharyngeal(NP) swabs in vial transport medium  Result Value Ref Range Status   SARS Coronavirus 2 by RT PCR NEGATIVE NEGATIVE Final    Comment: (NOTE) SARS-CoV-2 target nucleic acids are NOT DETECTED.  The SARS-CoV-2 RNA is generally detectable in upper respiratory specimens during the acute phase of infection. The lowest concentration of SARS-CoV-2 viral copies this assay can detect is 138 copies/mL. A negative result does not preclude SARS-Cov-2 infection and should not be used as the sole basis for treatment or other patient management decisions. A negative result may occur with  improper specimen collection/handling, submission of specimen other than nasopharyngeal swab, presence of viral mutation(s) within the areas targeted by this assay, and inadequate number of viral copies(<138 copies/mL). A negative result must be combined with clinical observations, patient history, and epidemiological information. The expected result is Negative.  Fact Sheet for Patients:  BloggerCourse.com  Fact Sheet for Healthcare Providers:  SeriousBroker.it  This test is no t yet approved or cleared by the [place] FDA and  has been authorized for detection and/or diagnosis of SARS-CoV-2 by FDA under an Emergency Use Authorization (EUA). This EUA will remain  in effect (meaning this test can be used) for the duration of the COVID-19 declaration  under Section 564(b)(1) of the Act, 21 U.S.C.section 360bbb-3(b)(1), unless the authorization is terminated  or revoked sooner.       Influenza A by PCR NEGATIVE NEGATIVE Final   Influenza B by PCR NEGATIVE NEGATIVE Final    Comment: (NOTE) The Xpert Xpress SARS-CoV-2/FLU/RSV plus assay is intended as an aid in the diagnosis of influenza from Nasopharyngeal swab specimens and should not be used as a sole basis for treatment. Nasal washings and aspirates are unacceptable for Xpert Xpress SARS-CoV-2/FLU/RSV testing.  Fact Sheet for Patients: BloggerCourse.com  Fact Sheet for Healthcare Providers: SeriousBroker.it  This test is not yet approved or cleared by  the [employer] and has been authorized for detection and/or diagnosis of SARS-CoV-2 by FDA under an Emergency Use Authorization (EUA). This EUA will remain in effect (meaning this test can be used) for the duration of the COVID-19 declaration under Section 564(b)(1) of the Act, 21 U.S.C. section 360bbb-3(b)(1), unless the authorization is terminated or revoked.  Performed at [facility], [address] [place], [place] [address]      Studies: MR LUMBAR SPINE WO CONTRAST  Result Date: 01/15/2021 CLINICAL DATA:  Low back pain, trauma EXAM: MRI LUMBAR SPINE WITHOUT CONTRAST TECHNIQUE: Multiplanar, multisequence MR imaging of the lumbar spine was performed. No intravenous contrast was administered. COMPARISON:  None. FINDINGS: Segmentation:  Standard. Alignment:  No significant listhesis. Vertebrae: Vertebral body heights are maintained. No marrow edema. No suspicious osseous lesion. Conus medullaris and cauda equina: Conus extends to the L1-L2 level. Conus and cauda equina appear normal. Paraspinal and other soft tissues: Unremarkable. Disc levels: Intervertebral disc heights and signal are maintained. There is no disc herniation. No canal or foraminal stenosis.  IMPRESSION: Normal MRI of the lumbar spine. Electronically Signed   By: Guadlupe Spanish M.D.   On: 01/15/2021 15:53   CT Renal Stone Study  Result Date: 01/14/2021 CLINICAL DATA:  Lower back pain since 01/04/2021, fever, diarrhea EXAM: CT ABDOMEN AND PELVIS WITHOUT CONTRAST TECHNIQUE: Multidetector CT imaging of the abdomen and pelvis was performed following the standard protocol without IV contrast. COMPARISON:  05/24/2020 FINDINGS: Lower chest: No acute pleural or parenchymal lung disease. Unenhanced CT was performed per clinician order. Lack of IV contrast limits sensitivity and specificity, especially for evaluation of abdominal/pelvic solid viscera. Hepatobiliary: No focal liver abnormality is seen. No gallstones, gallbladder wall thickening, or biliary dilatation. Pancreas: Unremarkable. No pancreatic ductal dilatation or surrounding inflammatory changes. Spleen: Normal in size without focal abnormality. Adrenals/Urinary Tract: There are 3 distinct nonobstructing less than 3 mm right renal calculi. No left-sided calculi. No obstructive uropathy within either kidney. The adrenals and bladder are unremarkable. Stomach/Bowel: No bowel obstruction or ileus. The appendix, if still present, is not well visualized. No evidence of bowel wall thickening or inflammatory change. Vascular/Lymphatic: No significant vascular findings are present. No enlarged abdominal or pelvic lymph nodes. Reproductive: Uterus and bilateral adnexa are unremarkable. Other: Trace free fluid extending from the right upper quadrant into the right hemipelvis. No free intraperitoneal gas. No abdominal wall hernia. Musculoskeletal: No acute or destructive bony lesions. Reconstructed images demonstrate no additional findings. IMPRESSION: 1. Trace free fluid within the right hemiabdomen and right lower pelvis, nonspecific. 2. Punctate nonobstructing right renal calculi. 3. Otherwise unremarkable exam limited by the lack of intravenous and oral  contrast. Electronically Signed   By: Sharlet Salina M.D.   On: 01/14/2021 15:29    Scheduled Meds:  clonazePAM  0.25 mg Oral BID   enoxaparin (LOVENOX) injection  30 mg Subcutaneous Q24H   feeding supplement  237 mL Oral TID BM   gabapentin  100 mg Oral BID   lidocaine  1 patch Transdermal Q24H   multivitamin with minerals  1 tablet Oral Daily   nortriptyline  20 mg Oral QHS   Continuous Infusions:  cefTRIAXone (ROCEPHIN)  IV 2 g (01/16/21 1251)   magnesium sulfate bolus IVPB     Brief history.  Patient admitted on 01/14/2021 with clinical sepsis and acute cystitis with hematuria.  Unfortunately urine culture was not sent from the emergency room.  I had to add on a urine culture on 01/15/2021.  Urine culture growing E. coli but sensitivities still pending.  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  MRI of the lumbar spine negative.  On 01/16/2021 had headache and vomiting.  Past medical history of anxiety depression, kidney stones, migraine, neuropathy.  Assessment/Plan:  Clinical sepsis with fever, tachycardia, lactic acidosis and leukocytosis, present on admission.  Patient has acute cystitis with hematuria.  E. coli growing out of the urine culture.  Sensitivities will not be back until tomorrow.  Continue Rocephin.  Lactic acidosis improved with fluids.  White blood cell count normalized. Headache and vomiting this afternoon.  Patient normally drinks quite a bit of caffeine.  Change diet to regular diet so she can drink some caffeine.  We will give Fioricet.  Also IV magnesium. Back pain.  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  MRI of the lumbar sacral spine negative.  Trial of Lidoderm patch seems to be helping a little bit. Hypokalemia replaced into the normal range Neuropathy on gabapentin  anxiety depression on Pamelor and clonazepam Occasional diarrhea.  With her anemia we will send off IgA transglutaminase  antibody    Code Status:     Code Status Orders  (From admission, onward)           Start     Ordered   01/14/21 1636  Full code  Continuous        01/14/21 1638           Code Status History     Date Active Date Inactive Code Status Order ID Comments User Context   01/25/2020 0813 01/26/2020 0211 Full Code [id number]  Farrel ConnersGutierrez, Colleen, CNM Inpatient   01/14/2020 0858 01/14/2020 1539 Full Code [id number]  Tresea [place], Jane, CNM Inpatient       Disposition Plan: Status is: Inpatient  Dispo: The patient is from: Home              Anticipated d/c is to: Hopefully home tomorrow              Patient currently just had some vomiting and having headache.  Being treated for E. coli sepsis.   Difficult to place patient.  No.  Antibiotics: Rocephin  Time spent: 27 minutes  Chana Lindstrom [employer]  [facility]

## 2021-01-16 NOTE — Plan of Care (Signed)
Continuing with plan of care. 

## 2021-01-17 LAB — CBC
HCT: 30.2 % — ABNORMAL LOW (ref 36.0–46.0)
Hemoglobin: 10 g/dL — ABNORMAL LOW (ref 12.0–15.0)
MCH: 31.6 pg (ref 26.0–34.0)
MCHC: 33.1 g/dL (ref 30.0–36.0)
MCV: 95.6 fL (ref 80.0–100.0)
Platelets: 591 10*3/uL — ABNORMAL HIGH (ref 150–400)
RBC: 3.16 MIL/uL — ABNORMAL LOW (ref 3.87–5.11)
RDW: 13.9 % (ref 11.5–15.5)
WBC: 6.4 10*3/uL (ref 4.0–10.5)
nRBC: 0 % (ref 0.0–0.2)

## 2021-01-17 LAB — IRON AND TIBC
Iron: 74 ug/dL (ref 28–170)
Saturation Ratios: 35 % — ABNORMAL HIGH (ref 10.4–31.8)
TIBC: 211 ug/dL — ABNORMAL LOW (ref 250–450)
UIBC: 137 ug/dL

## 2021-01-17 LAB — URINE CULTURE
Culture: >=100000 — AB
Special Requests: NORMAL

## 2021-01-17 LAB — FERRITIN: Ferritin: 148 ng/mL (ref 11–307)

## 2021-01-17 MED ORDER — CEPHALEXIN 500 MG PO CAPS
500.0000 mg | ORAL_CAPSULE | Freq: Four times a day (QID) | ORAL | Status: DC
  Administered 2021-01-17 – 2021-01-18 (×3): 500 mg via ORAL
  Filled 2021-01-17 (×3): qty 1

## 2021-01-17 NOTE — Progress Notes (Signed)
Patient ID: Megan Bentley, female   DOB: 06-13-1986, 35 y.o.   MRN: [id number] [facility] PROGRESS NOTE  Megan Bentley Megan Bentley DOB: Oct 26, 1985 DOA: 01/14/2021 PCP: Marjie Skiff, NP  Brief history.  Patient admitted on 01/14/2021 with clinical sepsis and acute cystitis with hematuria.  Unfortunately urine culture was not sent from the emergency room.  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  MRI of the lumbar spine negative.  On 01/16/2021 had headache and vomiting.  Past medical history of anxiety depression, kidney stones, migraine, neuropathy. Urine cultures with pansensitive E. Coli-antibiotics switched to Keflex today, patient would like to stay another day as she was feeling very weak. History of significant unintentional weight loss over the past 6 to 8 months and also early satiety-need to have work-up done as an outpatient, she will also need to see a gastroenterologist for weight loss work-up which can be arranged by PCP.  Assessment/Plan:  Clinical sepsis with fever, tachycardia, lactic acidosis and leukocytosis, present on admission.  Patient has acute cystitis with hematuria.  Lactic acidosis improved with fluids.  White blood cell count normalized.  Urine cultures with pansensitive E. coli. Switch ceftriaxone with Keflex to complete a 5-day course. Headache and vomiting -resolved Back pain.  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  MRI of the lumbar sacral spine negative.  Trial of Lidoderm patch seems to be helping a little bit. Hypokalemia replaced into the normal range Neuropathy on gabapentin  anxiety depression on Pamelor and clonazepam Occasional diarrhea.  With her anemia  IgA transglutaminase antibody were sent-pending results.  HPI/Subjective: Patient was feeling very weak when seen today.  Denies any more nausea or vomiting.  No diarrhea.  Per patient she is having unintentional weight  loss and early satiety for about a year.  Approximately weight loss of about 30 pounds in 83-month.  Discussed about following up with PCP and also seeing a outpatient gastroenterologist for further weight loss work-up.  Objective: Vitals:   01/17/21 0427 01/17/21 1033  BP: 112/71 109/75  Pulse: 70 83  Resp: 18 14  Temp: 98.3 F (36.8 C)   SpO2: 100% 100%    Intake/Output Summary (Last 24 hours) at 01/17/2021 1444 Last data filed at 01/17/2021 1037 Gross per 24 hour  Intake 360 ml  Output --  Net 360 ml    Filed Weights   01/14/21 1224  Weight: 40.8 kg   Exam: General.  Malnourished, underweight lady, in no acute distress. Pulmonary.  Lungs clear bilaterally, normal respiratory effort. CV.  Regular rate and rhythm, no JVD, rub or murmur. Abdomen.  Soft, nontender, nondistended, BS positive. CNS.  Alert and oriented x3.  No focal neurologic deficit. Extremities.  No edema, no cyanosis, pulses intact and symmetrical. Psychiatry.  Judgment and insight appears normal.   Data Reviewed: Basic Metabolic Panel: Recent Labs  Lab 01/14/21 1213 01/15/21 0447 01/16/21 0458  NA 135 138 137  K 3.7 3.0* 4.0  CL 99 105 103  CO2 22 26 28   GLUCOSE 133* 95 90  BUN 16 11 15   CREATININE 0.72 0.63 0.50  CALCIUM 8.8* 8.2* 8.3*  MG  --   --  1.7    Liver Function Tests: Recent Labs  Lab 01/14/21 1213  AST 26  ALT 33  ALKPHOS 70  BILITOT 0.8  PROT 7.0  ALBUMIN 3.0*    CBC: Recent Labs  Lab 01/14/21 1213 01/16/21 0458  01/17/21 0432  WBC 22.0* 8.8 6.4  NEUTROABS 18.7* 5.9  --   HGB 11.2* 9.2* 10.0*  HCT 33.2* 27.2* 30.2*  MCV 97.1 96.1 95.6  PLT 485* 473* 591*     BNP (last 3 results) Recent Labs    05/24/20 0939  BNP 8.8      Recent Results (from the past 240 hour(s))  Blood culture (routine x 2)     Status: None (Preliminary result)   Collection Time: 01/14/21 12:13 PM   Specimen: BLOOD  Result Value Ref Range Status   Specimen Description BLOOD RIGHT  ANTECUBITAL  Final   Special Requests   Final    BOTTLES DRAWN AEROBIC AND ANAEROBIC Blood Culture results may not be optimal due to an inadequate volume of blood received in culture bottles   Culture   Final    NO GROWTH 3 DAYS Performed at [facility], [address] [place], [place] [address]    Report Status PENDING  Incomplete  Urine Culture     Status: Abnormal   Collection Time: 01/14/21 12:27 PM   Specimen: Urine, Clean Catch  Result Value Ref Range Status   Specimen Description   Final    URINE, CLEAN CATCH Performed at [facility], [address] [place], [place] [address]    Special Requests   Final    Normal Performed at [facility], [address] [place], [place] [address]    Culture >=100,000 COLONIES/mL ESCHERICHIA COLI (A)  Final   Report Status 01/17/2021 FINAL  Final   Organism ID, Bacteria ESCHERICHIA COLI (A)  Final      Susceptibility   Escherichia coli - MIC*    AMPICILLIN <=2 SENSITIVE Sensitive     CEFAZOLIN <=4 SENSITIVE Sensitive     CEFEPIME <=0.12 SENSITIVE Sensitive     CEFTRIAXONE <=0.25 SENSITIVE Sensitive     CIPROFLOXACIN <=0.25 SENSITIVE Sensitive     GENTAMICIN <=1 SENSITIVE Sensitive     IMIPENEM <=0.25 SENSITIVE Sensitive     NITROFURANTOIN <=16 SENSITIVE Sensitive     TRIMETH/SULFA <=20 SENSITIVE Sensitive     AMPICILLIN/SULBACTAM <=2 SENSITIVE Sensitive     PIP/TAZO <=4 SENSITIVE Sensitive     * >=100,000 COLONIES/mL ESCHERICHIA COLI  Blood culture (routine x 2)     Status: None (Preliminary result)   Collection Time: 01/14/21  2:15 PM   Specimen: BLOOD  Result Value Ref Range Status   Specimen Description BLOOD RIGHT ANTECUBITAL  Final   Special Requests   Final    BOTTLES DRAWN AEROBIC AND ANAEROBIC Blood Culture adequate volume   Culture   Final    NO GROWTH 3 DAYS Performed at [facility], [address] [place], [place] [address]    Report Status PENDING  Incomplete  Resp  Panel by RT-PCR (Flu A&B, Covid) Urine, Clean Catch     Status: None   Collection Time: 01/14/21  2:15 PM   Specimen: Urine, Clean Catch; Nasopharyngeal(NP) swabs in vial transport medium  Result Value Ref Range Status   SARS Coronavirus 2 by RT PCR NEGATIVE NEGATIVE Final    Comment: (NOTE) SARS-CoV-2 target nucleic acids are NOT DETECTED.  The SARS-CoV-2 RNA is generally detectable in upper respiratory specimens during the acute phase of infection. The lowest concentration of SARS-CoV-2 viral copies this assay can detect is 138 copies/mL. A negative result does not preclude SARS-Cov-2 infection and should not be used as the sole basis for treatment or other patient management decisions.  A negative result may occur with  improper specimen collection/handling, submission of specimen other than nasopharyngeal swab, presence of viral mutation(s) within the areas targeted by this assay, and inadequate number of viral copies(<138 copies/mL). A negative result must be combined with clinical observations, patient history, and epidemiological information. The expected result is Negative.  Fact Sheet for Patients:  BloggerCourse.com  Fact Sheet for Healthcare Providers:  SeriousBroker.it  This test is no t yet approved or cleared by the [place] FDA and  has been authorized for detection and/or diagnosis of SARS-CoV-2 by FDA under an Emergency Use Authorization (EUA). This EUA will remain  in effect (meaning this test can be used) for the duration of the COVID-19 declaration under Section 564(b)(1) of the Act, 21 U.S.C.section 360bbb-3(b)(1), unless the authorization is terminated  or revoked sooner.       Influenza A by PCR NEGATIVE NEGATIVE Final   Influenza B by PCR NEGATIVE NEGATIVE Final    Comment: (NOTE) The Xpert Xpress SARS-CoV-2/FLU/RSV plus assay is intended as an aid in the diagnosis of influenza from Nasopharyngeal  swab specimens and should not be used as a sole basis for treatment. Nasal washings and aspirates are unacceptable for Xpert Xpress SARS-CoV-2/FLU/RSV testing.  Fact Sheet for Patients: BloggerCourse.com  Fact Sheet for Healthcare Providers: SeriousBroker.it  This test is not yet approved or cleared by the [place] FDA and has been authorized for detection and/or diagnosis of SARS-CoV-2 by FDA under an Emergency Use Authorization (EUA). This EUA will remain in effect (meaning this test can be used) for the duration of the COVID-19 declaration under Section 564(b)(1) of the Act, 21 U.S.C. section 360bbb-3(b)(1), unless the authorization is terminated or revoked.  Performed at [facility], [address] [place], [place] [address]       Studies: No results found.  Scheduled Meds:  cephALEXin  500 mg Oral Q6H   clonazePAM  0.25 mg Oral BID   enoxaparin (LOVENOX) injection  30 mg Subcutaneous Q24H   feeding supplement  237 mL Oral TID BM   gabapentin  100 mg Oral BID   lidocaine  1 patch Transdermal Q24H   multivitamin with minerals  1 tablet Oral Daily   nortriptyline  20 mg Oral QHS   Continuous Infusions:    Code Status:     Code Status Orders  (From admission, onward)           Start     Ordered   01/14/21 1636  Full code  Continuous        01/14/21 1638           Code Status History     Date Active Date Inactive Code Status Order ID Comments User Context   01/25/2020 0813 01/26/2020 0211 Full Code [id number]  Farrel Conners, CNM Inpatient   01/14/2020 0858 01/14/2020 1539 Full Code [id number]  [name], CNM Inpatient       Disposition Plan: Status is: Inpatient  Dispo: The patient is from: Home              Anticipated d/c is to: Hopefully home tomorrow                Difficult to place patient.  No.  Antibiotics: Keflex  Time spent: 35 minutes  This record has been  created using Conservation officer, historic buildings. Errors have been sought and corrected,but may not always be located. Such creation errors do not reflect on the standard of care.  Arnetha Courser  [facility]

## 2021-01-17 NOTE — Progress Notes (Signed)
Mobility Specialist - Progress Note   01/17/21 1500  Mobility  Activity Ambulated in room  Level of Assistance Independent  Assistive Device None  Distance Ambulated (ft) 50 ft  Mobility Ambulated independently in room  Mobility Response Tolerated well  Mobility performed by Mobility specialist  $Mobility charge 1 Mobility    Pt lying in bed upon arrival, utilizing RA. Pt independent with transfers and ambulation. No LOB. Does voice mild dizziness upon sitting, resolved. Some back pain. Denies nausea. Pt reports feeling at/near baseline for mobility. Family at bedside.    Megan Bentley Mobility Specialist 01/17/21, 3:42 PM

## 2021-01-17 NOTE — Plan of Care (Signed)
Continuing with plan of care. 

## 2021-01-18 DIAGNOSIS — F419 Anxiety disorder, unspecified: Secondary | ICD-10-CM

## 2021-01-18 DIAGNOSIS — F32A Depression, unspecified: Secondary | ICD-10-CM

## 2021-01-18 DIAGNOSIS — R41840 Attention and concentration deficit: Secondary | ICD-10-CM

## 2021-01-18 MED ORDER — ADULT MULTIVITAMIN W/MINERALS CH: 1.0000 | ORAL_TABLET | Freq: Every day | ORAL | Status: DC

## 2021-01-18 MED ORDER — ENSURE ENLIVE PO LIQD: 237.0000 mL | Freq: Three times a day (TID) | ORAL | 12 refills | Status: DC

## 2021-01-18 MED ORDER — HYDROCODONE-ACETAMINOPHEN 5-325 MG PO TABS: 1.0000 | ORAL_TABLET | Freq: Two times a day (BID) | ORAL | 0 refills | Status: DC | PRN

## 2021-01-18 MED ORDER — CEPHALEXIN 500 MG PO CAPS: 500.0000 mg | ORAL_CAPSULE | Freq: Two times a day (BID) | ORAL | 0 refills | Status: AC

## 2021-01-18 NOTE — Plan of Care (Signed)
Discharge teaching completed with patient who is in stable condition. 

## 2021-01-18 NOTE — Plan of Care (Signed)
Continuing with plan of care. 

## 2021-01-18 NOTE — Discharge Summary (Signed)
Physician Discharge Summary  Megan Bentley CNO:[id number] DOB: 03-12-1986 DOA: 01/14/2021  PCP: Marjie Skiff, NP  Admit date: 01/14/2021 Discharge date: 01/18/2021  Admitted From: Home.  Disposition: Home.   Recommendations for Outpatient Follow-up:  Follow up with PCP in 1-2 weeks Please obtain BMP/CBC in one week Recommend outpatient follow up with gastroenterology for unintentional weight loss.    Discharge Condition:stable.  CODE STATUS:full code.  Diet recommendation: Heart Healthy   Brief/Interim Summary:  Patient admitted on 01/14/2021 with clinical sepsis and acute cystitis with hematuria.  Unfortunately urine culture was not sent from the emergency room.  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 as outpatient.  MRI of the lumbar spine negative.  On 01/16/2021 had headache and vomiting.  Past medical history of anxiety depression, kidney stones, migraine, neuropathy. Urine cultures with pansensitive E. Coli-antibiotics switched to Keflex today, patient would like to stay another day as she was feeling very weak. History of significant unintentional weight loss over the past 6 to 8 months and also early satiety-need to have work-up done as an outpatient, she will also need to see a gastroenterologist for weight loss work-up which can be arranged by PCP  Discharge Diagnoses:  Principal Problem:   Sepsis secondary to UTI [facility]) Active Problems:   Nausea and vomiting   Acute nonintractable headache   Anxiety and depression   Acute cystitis with hematuria   Hypokalemia   Sepsis due to Escherichia coli without acute organ dysfunction (HCC)  Sepsis secondary to E coli UTI.   with fever, tachycardia, lactic acidosis and leukocytosis, present on admission.   Patient has acute cystitis with hematuria.   Lactic acidosis improved with fluids.   White blood cell count normalized.   Urine cultures with pansensitive E. coli. Switch  ceftriaxone with Keflex to complete a 5-day course.  Headache and vomiting -resolved  Back pain.  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  MRI of the lumbar sacral spine negative.  Trial of Lidoderm patch seems to be helping a little bit.  Hypokalemia replaced into the normal range  Neuropathy on gabapentin  anxiety depression on Pamelor and clonazepam  Occasional diarrhea.   With her anemia  IgA transglutaminase antibody were sent-pending results. Please follow up with PCP.   Discharge Instructions  Discharge Instructions     Diet - low sodium heart healthy   Complete by: As directed    Discharge instructions   Complete by: As directed    Please follow up with PCP in 3 days.      Allergies as of 01/18/2021       Reactions   Sulfa Antibiotics Anaphylaxis   Amoxicillin Itching, Other (See Comments)   Burning skin        Medication List     STOP taking these medications    ibuprofen 600 MG tablet Commonly known as: ADVIL       TAKE these medications    cephALEXin 500 MG capsule Commonly known as: KEFLEX Take 1 capsule (500 mg total) by mouth 2 (two) times daily for 3 days.   clonazePAM 0.5 MG tablet Commonly known as: KLONOPIN Take 0.5 tablets (0.25 mg total) by mouth 2 (two) times daily as needed for anxiety. What changed: when to take this   cyclobenzaprine 5 MG tablet Commonly known as: FLEXERIL Take 1 tablet (5 mg total) by mouth 3 (three) times daily as needed for muscle spasms.   feeding  supplement Liqd Take 237 mLs by mouth 3 (three) times daily between meals.   gabapentin 100 MG capsule Commonly known as: NEURONTIN Take 100 mg twice a day for 1 week, then increase to 200 mg twice a day   HYDROcodone-acetaminophen 5-325 MG tablet Commonly known as: NORCO/VICODIN Take 1 tablet by mouth every 12 (twelve) hours as needed for up to 5 days for severe pain.   multivitamin with minerals Tabs tablet Take 1 tablet by mouth daily. Start taking on:  January 19, 2021   nortriptyline 10 MG capsule Commonly known as: PAMELOR Take 20 mg by mouth at bedtime.        Allergies  Allergen Reactions   Sulfa Antibiotics Anaphylaxis   Amoxicillin Itching and Other (See Comments)    Burning skin    Consultations: None.    Procedures/Studies: MR LUMBAR SPINE WO CONTRAST  Result Date: 01/15/2021 CLINICAL DATA:  Low back pain, trauma EXAM: MRI LUMBAR SPINE WITHOUT CONTRAST TECHNIQUE: Multiplanar, multisequence MR imaging of the lumbar spine was performed. No intravenous contrast was administered. COMPARISON:  None. FINDINGS: Segmentation:  Standard. Alignment:  No significant listhesis. Vertebrae: Vertebral body heights are maintained. No marrow edema. No suspicious osseous lesion. Conus medullaris and cauda equina: Conus extends to the L1-L2 level. Conus and cauda equina appear normal. Paraspinal and other soft tissues: Unremarkable. Disc levels: Intervertebral disc heights and signal are maintained. There is no disc herniation. No canal or foraminal stenosis. IMPRESSION: Normal MRI of the lumbar spine. Electronically Signed   By: Guadlupe Spanish M.D.   On: 01/15/2021 15:53   CT Renal Stone Study  Result Date: 01/14/2021 CLINICAL DATA:  Lower back pain since 01/04/2021, fever, diarrhea EXAM: CT ABDOMEN AND PELVIS WITHOUT CONTRAST TECHNIQUE: Multidetector CT imaging of the abdomen and pelvis was performed following the standard protocol without IV contrast. COMPARISON:  05/24/2020 FINDINGS: Lower chest: No acute pleural or parenchymal lung disease. Unenhanced CT was performed per clinician order. Lack of IV contrast limits sensitivity and specificity, especially for evaluation of abdominal/pelvic solid viscera. Hepatobiliary: No focal liver abnormality is seen. No gallstones, gallbladder wall thickening, or biliary dilatation. Pancreas: Unremarkable. No pancreatic ductal dilatation or surrounding inflammatory changes. Spleen: Normal in size without focal  abnormality. Adrenals/Urinary Tract: There are 3 distinct nonobstructing less than 3 mm right renal calculi. No left-sided calculi. No obstructive uropathy within either kidney. The adrenals and bladder are unremarkable. Stomach/Bowel: No bowel obstruction or ileus. The appendix, if still present, is not well visualized. No evidence of bowel wall thickening or inflammatory change. Vascular/Lymphatic: No significant vascular findings are present. No enlarged abdominal or pelvic lymph nodes. Reproductive: Uterus and bilateral adnexa are unremarkable. Other: Trace free fluid extending from the right upper quadrant into the right hemipelvis. No free intraperitoneal gas. No abdominal wall hernia. Musculoskeletal: No acute or destructive bony lesions. Reconstructed images demonstrate no additional findings. IMPRESSION: 1. Trace free fluid within the right hemiabdomen and right lower pelvis, nonspecific. 2. Punctate nonobstructing right renal calculi. 3. Otherwise unremarkable exam limited by the lack of intravenous and oral contrast. Electronically Signed   By: Sharlet Salina M.D.   On: 01/14/2021 15:29       Subjective:  No new complaints.  Discharge Exam: Vitals:   01/18/21 0445 01/18/21 0807  BP: 101/67 96/65  Pulse: 70 75  Resp: 16 16  Temp: 97.6 F (36.4 C) 97.8 F (36.6 C)  SpO2: 100% 100%   Vitals:   01/17/21 1611 01/17/21 2057 01/18/21 0445 01/18/21 1610  BP: 112/83 99/67 101/67 96/65  Pulse: 81 89 70 75  Resp: 18 20 16 16   Temp: 98.4 F (36.9 C) 98.1 F (36.7 C) 97.6 F (36.4 C) 97.8 F (36.6 C)  TempSrc: Oral Oral  Oral  SpO2: 100% 98% 100% 100%  Weight:      Height:        General: Pt is alert, awake, not in acute distress Cardiovascular: RRR, S1/S2 +, no rubs, no gallops Respiratory: CTA bilaterally, no wheezing, no rhonchi Abdominal: Soft, NT, ND, bowel sounds + Extremities: no edema, no cyanosis    The results of significant diagnostics from this hospitalization  (including imaging, microbiology, ancillary and laboratory) are listed below for reference.     Microbiology: Recent Results (from the past 240 hour(s))  Blood culture (routine x 2)     Status: None (Preliminary result)   Collection Time: 01/14/21 12:13 PM   Specimen: BLOOD  Result Value Ref Range Status   Specimen Description BLOOD RIGHT ANTECUBITAL  Final   Special Requests   Final    BOTTLES DRAWN AEROBIC AND ANAEROBIC Blood Culture results may not be optimal due to an inadequate volume of blood received in culture bottles   Culture   Final    NO GROWTH 4 DAYS Performed at [facility], [address] [place], [place] [address]    Report Status PENDING  Incomplete  Urine Culture     Status: Abnormal   Collection Time: 01/14/21 12:27 PM   Specimen: Urine, Clean Catch  Result Value Ref Range Status   Specimen Description   Final    URINE, CLEAN CATCH Performed at [facility], [address] [place], [place] [address]    Special Requests   Final    Normal Performed at [facility], [address] [place], [place] [address]    Culture >=100,000 COLONIES/mL ESCHERICHIA COLI (A)  Final   Report Status 01/17/2021 FINAL  Final   Organism ID, Bacteria ESCHERICHIA COLI (A)  Final      Susceptibility   Escherichia coli - MIC*    AMPICILLIN <=2 SENSITIVE Sensitive     CEFAZOLIN <=4 SENSITIVE Sensitive     CEFEPIME <=0.12 SENSITIVE Sensitive     CEFTRIAXONE <=0.25 SENSITIVE Sensitive     CIPROFLOXACIN <=0.25 SENSITIVE Sensitive     GENTAMICIN <=1 SENSITIVE Sensitive     IMIPENEM <=0.25 SENSITIVE Sensitive     NITROFURANTOIN <=16 SENSITIVE Sensitive     TRIMETH/SULFA <=20 SENSITIVE Sensitive     AMPICILLIN/SULBACTAM <=2 SENSITIVE Sensitive     PIP/TAZO <=4 SENSITIVE Sensitive     * >=100,000 COLONIES/mL ESCHERICHIA COLI  Blood culture (routine x 2)     Status: None (Preliminary result)   Collection Time: 01/14/21  2:15 PM   Specimen: BLOOD   Result Value Ref Range Status   Specimen Description BLOOD RIGHT ANTECUBITAL  Final   Special Requests   Final    BOTTLES DRAWN AEROBIC AND ANAEROBIC Blood Culture adequate volume   Culture   Final    NO GROWTH 4 DAYS Performed at [facility], [address] [place], [place] [address]    Report Status PENDING  Incomplete  Resp Panel by RT-PCR (Flu A&B, Covid) Urine, Clean Catch     Status: None   Collection Time: 01/14/21  2:15 PM   Specimen: Urine, Clean Catch; Nasopharyngeal(NP) swabs in vial transport medium  Result Value Ref Range Status   SARS Coronavirus 2 by RT PCR NEGATIVE NEGATIVE  Final    Comment: (NOTE) SARS-CoV-2 target nucleic acids are NOT DETECTED.  The SARS-CoV-2 RNA is generally detectable in upper respiratory specimens during the acute phase of infection. The lowest concentration of SARS-CoV-2 viral copies this assay can detect is 138 copies/mL. A negative result does not preclude SARS-Cov-2 infection and should not be used as the sole basis for treatment or other patient management decisions. A negative result may occur with  improper specimen collection/handling, submission of specimen other than nasopharyngeal swab, presence of viral mutation(s) within the areas targeted by this assay, and inadequate number of viral copies(<138 copies/mL). A negative result must be combined with clinical observations, patient history, and epidemiological information. The expected result is Negative.  Fact Sheet for Patients:  BloggerCourse.com  Fact Sheet for Healthcare Providers:  SeriousBroker.it  This test is no t yet approved or cleared by the [place] FDA and  has been authorized for detection and/or diagnosis of SARS-CoV-2 by FDA under an Emergency Use Authorization (EUA). This EUA will remain  in effect (meaning this test can be used) for the duration of the COVID-19 declaration under Section  564(b)(1) of the Act, 21 U.S.C.section 360bbb-3(b)(1), unless the authorization is terminated  or revoked sooner.       Influenza A by PCR NEGATIVE NEGATIVE Final   Influenza B by PCR NEGATIVE NEGATIVE Final    Comment: (NOTE) The Xpert Xpress SARS-CoV-2/FLU/RSV plus assay is intended as an aid in the diagnosis of influenza from Nasopharyngeal swab specimens and should not be used as a sole basis for treatment. Nasal washings and aspirates are unacceptable for Xpert Xpress SARS-CoV-2/FLU/RSV testing.  Fact Sheet for Patients: BloggerCourse.com  Fact Sheet for Healthcare Providers: SeriousBroker.it  This test is not yet approved or cleared by the [place] FDA and has been authorized for detection and/or diagnosis of SARS-CoV-2 by FDA under an Emergency Use Authorization (EUA). This EUA will remain in effect (meaning this test can be used) for the duration of the COVID-19 declaration under Section 564(b)(1) of the Act, 21 U.S.C. section 360bbb-3(b)(1), unless the authorization is terminated or revoked.  Performed at [facility], [address] [place] [address] [place], [place] [address]      Labs: BNP (last 3 results) Recent Labs    05/24/20 0939  BNP 8.8   Basic Metabolic Panel: Recent Labs  Lab 01/14/21 1213 01/15/21 0447 01/16/21 0458  NA 135 138 137  K 3.7 3.0* 4.0  CL 99 105 103  CO2 22 26 28   GLUCOSE 133* 95 90  BUN 16 11 15   CREATININE 0.72 0.63 0.50  CALCIUM 8.8* 8.2* 8.3*  MG  --   --  1.7   Liver Function Tests: Recent Labs  Lab 01/14/21 1213  AST 26  ALT 33  ALKPHOS 70  BILITOT 0.8  PROT 7.0  ALBUMIN 3.0*   No results for input(s): LIPASE, AMYLASE in the last 168 hours. No results for input(s): AMMONIA in the last 168 hours. CBC: Recent Labs  Lab 01/14/21 1213 01/16/21 0458 01/17/21 0432  WBC 22.0* 8.8 6.4  NEUTROABS 18.7* 5.9  --   HGB 11.2* 9.2* 10.0*  HCT 33.2* 27.2* 30.2*  MCV  97.1 96.1 95.6  PLT 485* 473* 591*   Cardiac Enzymes: No results for input(s): CKTOTAL, CKMB, CKMBINDEX, TROPONINI in the last 168 hours. BNP: Invalid input(s): POCBNP CBG: No results for input(s): GLUCAP in the last 168 hours. D-Dimer No results for input(s): DDIMER in the last 72 hours. Hgb A1c No results  for input(s): HGBA1C in the last 72 hours. Lipid Profile No results for input(s): CHOL, HDL, LDLCALC, TRIG, CHOLHDL, LDLDIRECT in the last 72 hours. Thyroid function studies No results for input(s): TSH, T4TOTAL, T3FREE, THYROIDAB in the last 72 hours.  Invalid input(s): FREET3 Anemia work up Recent Labs    01/17/21 0432  FERRITIN 148  TIBC 211*  IRON 74   Urinalysis    Component Value Date/Time   COLORURINE YELLOW (A) 01/14/2021 1227   APPEARANCEUR CLOUDY (A) 01/14/2021 1227   APPEARANCEUR Cloudy (A) 10/09/2020 0839   LABSPEC 1.024 01/14/2021 1227   LABSPEC 1.014 04/16/2013 0844   PHURINE 5.0 01/14/2021 1227   GLUCOSEU NEGATIVE 01/14/2021 1227   GLUCOSEU Negative 04/16/2013 0844   HGBUR MODERATE (A) 01/14/2021 1227   BILIRUBINUR NEGATIVE 01/14/2021 1227   BILIRUBINUR Negative 10/09/2020 0839   BILIRUBINUR Negative 04/16/2013 0844   KETONESUR NEGATIVE 01/14/2021 1227   PROTEINUR 30 (A) 01/14/2021 1227   NITRITE NEGATIVE 01/14/2021 1227   LEUKOCYTESUR SMALL (A) 01/14/2021 1227   LEUKOCYTESUR Negative 04/16/2013 0844   Sepsis Labs Invalid input(s): PROCALCITONIN,  WBC,  LACTICIDVEN Microbiology Recent Results (from the past 240 hour(s))  Blood culture (routine x 2)     Status: None (Preliminary result)   Collection Time: 01/14/21 12:13 PM   Specimen: BLOOD  Result Value Ref Range Status   Specimen Description BLOOD RIGHT ANTECUBITAL  Final   Special Requests   Final    BOTTLES DRAWN AEROBIC AND ANAEROBIC Blood Culture results may not be optimal due to an inadequate volume of blood received in culture bottles   Culture   Final    NO GROWTH 4 DAYS Performed  at [facility], [address] [place], [place] [address]    Report Status PENDING  Incomplete  Urine Culture     Status: Abnormal   Collection Time: 01/14/21 12:27 PM   Specimen: Urine, Clean Catch  Result Value Ref Range Status   Specimen Description   Final    URINE, CLEAN CATCH Performed at [facility], [address] [place], [place] [address]    Special Requests   Final    Normal Performed at [facility], [address] [place], [place] [address]    Culture >=100,000 COLONIES/mL ESCHERICHIA COLI (A)  Final   Report Status 01/17/2021 FINAL  Final   Organism ID, Bacteria ESCHERICHIA COLI (A)  Final      Susceptibility   Escherichia coli - MIC*    AMPICILLIN <=2 SENSITIVE Sensitive     CEFAZOLIN <=4 SENSITIVE Sensitive     CEFEPIME <=0.12 SENSITIVE Sensitive     CEFTRIAXONE <=0.25 SENSITIVE Sensitive     CIPROFLOXACIN <=0.25 SENSITIVE Sensitive     GENTAMICIN <=1 SENSITIVE Sensitive     IMIPENEM <=0.25 SENSITIVE Sensitive     NITROFURANTOIN <=16 SENSITIVE Sensitive     TRIMETH/SULFA <=20 SENSITIVE Sensitive     AMPICILLIN/SULBACTAM <=2 SENSITIVE Sensitive     PIP/TAZO <=4 SENSITIVE Sensitive     * >=100,000 COLONIES/mL ESCHERICHIA COLI  Blood culture (routine x 2)     Status: None (Preliminary result)   Collection Time: 01/14/21  2:15 PM   Specimen: BLOOD  Result Value Ref Range Status   Specimen Description BLOOD RIGHT ANTECUBITAL  Final   Special Requests   Final    BOTTLES DRAWN AEROBIC AND ANAEROBIC Blood Culture adequate volume   Culture   Final    NO GROWTH 4 DAYS Performed at [facility], [address] [place], [place] [address]    Report Status PENDING  Incomplete  Resp Panel by RT-PCR (Flu A&B, Covid) Urine, Clean Catch     Status: None   Collection Time: 01/14/21  2:15 PM   Specimen: Urine, Clean Catch; Nasopharyngeal(NP) swabs in vial transport medium  Result Value Ref Range Status   SARS Coronavirus 2 by  RT PCR NEGATIVE NEGATIVE Final    Comment: (NOTE) SARS-CoV-2 target nucleic acids are NOT DETECTED.  The SARS-CoV-2 RNA is generally detectable in upper respiratory specimens during the acute phase of infection. The lowest concentration of SARS-CoV-2 viral copies this assay can detect is 138 copies/mL. A negative result does not preclude SARS-Cov-2 infection and should not be used as the sole basis for treatment or other patient management decisions. A negative result may occur with  improper specimen collection/handling, submission of specimen other than nasopharyngeal swab, presence of viral mutation(s) within the areas targeted by this assay, and inadequate number of viral copies(<138 copies/mL). A negative result must be combined with clinical observations, patient history, and epidemiological information. The expected result is Negative.  Fact Sheet for Patients:  BloggerCourse.com  Fact Sheet for Healthcare Providers:  SeriousBroker.it  This test is no t yet approved or cleared by the [place] FDA and  has been authorized for detection and/or diagnosis of SARS-CoV-2 by FDA under an Emergency Use Authorization (EUA). This EUA will remain  in effect (meaning this test can be used) for the duration of the COVID-19 declaration under Section 564(b)(1) of the Act, 21 U.S.C.section 360bbb-3(b)(1), unless the authorization is terminated  or revoked sooner.       Influenza A by PCR NEGATIVE NEGATIVE Final   Influenza B by PCR NEGATIVE NEGATIVE Final    Comment: (NOTE) The Xpert Xpress SARS-CoV-2/FLU/RSV plus assay is intended as an aid in the diagnosis of influenza from Nasopharyngeal swab specimens and should not be used as a sole basis for treatment. Nasal washings and aspirates are unacceptable for Xpert Xpress SARS-CoV-2/FLU/RSV testing.  Fact Sheet for Patients: BloggerCourse.com  Fact Sheet  for Healthcare Providers: SeriousBroker.it  This test is not yet approved or cleared by the [place] FDA and has been authorized for detection and/or diagnosis of SARS-CoV-2 by FDA under an Emergency Use Authorization (EUA). This EUA will remain in effect (meaning this test can be used) for the duration of the COVID-19 declaration under Section 564(b)(1) of the Act, 21 U.S.C. section 360bbb-3(b)(1), unless the authorization is terminated or revoked.  Performed at [facility], [address], [place], [place] [address]      Time coordinating discharge:36 minutes.  SIGNED:   Kathlen Mody, MD  [facility] 01/18/2021, 10:43 AM

## 2021-01-19 LAB — CULTURE, BLOOD (ROUTINE X 2)
Culture: NO GROWTH
Culture: NO GROWTH
Special Requests: ADEQUATE

## 2021-01-19 NOTE — Telephone Encounter (Signed)
Hospital follow up with [name] this week please

## 2021-01-20 ENCOUNTER — Encounter: Payer: Self-pay | Admitting: Nurse Practitioner

## 2021-01-20 ENCOUNTER — Telehealth: Payer: Self-pay | Admitting: *Deleted

## 2021-01-20 ENCOUNTER — Ambulatory Visit (INDEPENDENT_AMBULATORY_CARE_PROVIDER_SITE_OTHER): Payer: Medicaid Other | Admitting: Nurse Practitioner

## 2021-01-20 ENCOUNTER — Telehealth: Payer: Self-pay

## 2021-01-20 ENCOUNTER — Other Ambulatory Visit: Payer: Self-pay

## 2021-01-20 VITALS — BP 119/73 | HR 96 | Temp 98.9°F | Wt 88.2 lb

## 2021-01-20 DIAGNOSIS — Z1159 Encounter for screening for other viral diseases: Secondary | ICD-10-CM

## 2021-01-20 DIAGNOSIS — M545 Low back pain, unspecified: Secondary | ICD-10-CM | POA: Diagnosis not present

## 2021-01-20 DIAGNOSIS — R634 Abnormal weight loss: Secondary | ICD-10-CM | POA: Diagnosis not present

## 2021-01-20 DIAGNOSIS — N390 Urinary tract infection, site not specified: Secondary | ICD-10-CM | POA: Diagnosis not present

## 2021-01-20 DIAGNOSIS — M5116 Intervertebral disc disorders with radiculopathy, lumbar region: Secondary | ICD-10-CM | POA: Diagnosis not present

## 2021-01-20 DIAGNOSIS — R7989 Other specified abnormal findings of blood chemistry: Secondary | ICD-10-CM

## 2021-01-20 DIAGNOSIS — E440 Moderate protein-calorie malnutrition: Secondary | ICD-10-CM | POA: Insufficient documentation

## 2021-01-20 DIAGNOSIS — A419 Sepsis, unspecified organism: Secondary | ICD-10-CM | POA: Diagnosis not present

## 2021-01-20 DIAGNOSIS — A4151 Sepsis due to Escherichia coli [E. coli]: Secondary | ICD-10-CM | POA: Diagnosis not present

## 2021-01-20 DIAGNOSIS — N200 Calculus of kidney: Secondary | ICD-10-CM | POA: Diagnosis not present

## 2021-01-20 DIAGNOSIS — M5127 Other intervertebral disc displacement, lumbosacral region: Secondary | ICD-10-CM | POA: Diagnosis not present

## 2021-01-20 DIAGNOSIS — G43109 Migraine with aura, not intractable, without status migrainosus: Secondary | ICD-10-CM

## 2021-01-20 DIAGNOSIS — M5416 Radiculopathy, lumbar region: Secondary | ICD-10-CM | POA: Diagnosis not present

## 2021-01-20 DIAGNOSIS — E876 Hypokalemia: Secondary | ICD-10-CM | POA: Diagnosis not present

## 2021-01-20 DIAGNOSIS — M48061 Spinal stenosis, lumbar region without neurogenic claudication: Secondary | ICD-10-CM | POA: Diagnosis not present

## 2021-01-20 LAB — MICROSCOPIC EXAMINATION: RBC, Urine: NONE SEEN /hpf (ref 0–2)

## 2021-01-20 LAB — URINALYSIS, ROUTINE W REFLEX MICROSCOPIC
Bilirubin, UA: NEGATIVE
Glucose, UA: NEGATIVE
Ketones, UA: NEGATIVE
Nitrite, UA: NEGATIVE
Protein,UA: NEGATIVE
RBC, UA: NEGATIVE
Specific Gravity, UA: 1.01 (ref 1.005–1.030)
Urobilinogen, Ur: 0.2 mg/dL (ref 0.2–1.0)
pH, UA: 6 (ref 5.0–7.5)

## 2021-01-20 LAB — WET PREP FOR TRICH, YEAST, CLUE
Clue Cell Exam: NEGATIVE
Trichomonas Exam: NEGATIVE
Yeast Exam: NEGATIVE

## 2021-01-20 MED ORDER — BUTALBITAL-APAP-CAFFEINE 50-325-40 MG PO TABS: 1.0000 | ORAL_TABLET | Freq: Four times a day (QID) | ORAL | 0 refills | Status: DC | PRN

## 2021-01-20 MED ORDER — ONDANSETRON 4 MG PO TBDP: 4.0000 mg | ORAL_TABLET | Freq: Three times a day (TID) | ORAL | 0 refills | Status: DC | PRN

## 2021-01-20 MED ORDER — HYDROCODONE-ACETAMINOPHEN 5-325 MG PO TABS: 1.0000 | ORAL_TABLET | Freq: Four times a day (QID) | ORAL | 0 refills | Status: AC | PRN

## 2021-01-20 NOTE — Assessment & Plan Note (Addendum)
Acute and improving, although some ongoing waxing and waning right back/abdomen pain (?related to kidney stones).  Will obtain urine sample outpatient, unable to void in office today + check UDS.  Wet prep negative.  At this time will send in Zofran for nausea and refill Norco with increase in frequency she can use to Q6H, as this is offering benefit (5 day supply sent).  Unable to provide Flomax due to sulfa allergy, which she is aware of as has been told this before.  Urgent referral to urology to further assess.  Return in one week, sooner if worsening symptoms.  CBC and CMP today.

## 2021-01-20 NOTE — Telephone Encounter (Signed)
Copied from CRM [id number]. Topic: General - Call Back - No Documentation >> Jan 20, 2021 12:37 PM Randol Kern wrote: [phone] Pharmacy called and needs a call back regarding pt's Hydrocodone order from today. Please advise >> Jan 20, 2021 12:45 PM Gwenlyn Fudge wrote: Pt calling in and is requesting to have this prescription approved for her to take home today. She states that the pharmacy has it, but they are needing approval to give it to her. Please advise.

## 2021-01-20 NOTE — Patient Instructions (Signed)
Flank Pain, Adult Flank pain is pain in your side. The flank is the area of your side between your upper belly (abdomen) and your back. The pain may occur over a short time (acute), or it may be long-term or come back often (chronic). It may be mild or very bad. Pain in this area can be caused by manydifferent things. Follow these instructions at home:  Drink enough fluid to keep your pee (urine) clear or pale yellow. Rest as told by your doctor. Take over-the-counter and prescription medicines only as told by your doctor. Keep a journal to keep track of: What has caused your flank pain. What has made it feel better. Keep all follow-up visits as told by your doctor. This is important. Contact a doctor if: Medicine does not help your pain. You have new symptoms. Your pain gets worse. You have a fever. Your symptoms last longer than 2-3 days. You have trouble peeing. You are peeing more often than normal. Get help right away if: You have trouble breathing. You are short of breath. Your belly hurts, or it is swollen or red. You feel sick to your stomach (nauseous). You throw up (vomit). You feel like you will pass out, or you do pass out (faint). You have blood in your pee. Summary Flank pain is pain in your side. The flank is the area of your side between your upper belly (abdomen) and your back. Flank pain may occur over a short time (acute), or it may be long-term or come back often (chronic). It may be mild or very bad. Pain in this area can be caused by many different things. Contact your doctor if your symptoms get worse or they last longer than 2-3 days. This information is not intended to replace advice given to you by your health care provider. Make sure you discuss any questions you have with your healthcare provider. Document Revised: 02/19/2020 Document Reviewed: 02/22/2020 Elsevier Patient Education  2022 Elsevier Inc.  

## 2021-01-20 NOTE — Assessment & Plan Note (Signed)
Recheck level today, recent at discharge was stable.

## 2021-01-20 NOTE — Assessment & Plan Note (Signed)
Has lost 10 pounds since April 28th, 2022.  Reports eating three small meals a day and drinking Ensure TID, continue this.  CBC and CMP today.  ?kidney stones, referral to urology placed.  Nutritionist in hospital recommended referral to GI, will place this today.  May benefit from celiac panel, unable to obtain today.  Return in one week for weight check.

## 2021-01-20 NOTE — Progress Notes (Addendum)
BP 119/73   Pulse 96   Temp 98.9 F (37.2 C) (Oral)   Wt 88 lb 3.2 oz (40 kg)   LMP 01/07/2021   SpO2 96%   BMI 15.14 kg/m    Subjective:    Patient ID: Megan Bentley, female    DOB: 1986-06-13, 34 y.o.   MRN: [id number]  HPI: Megan Bentley is a 35 y.o. female  Chief Complaint  Patient presents with   Hospitalization Follow-up    Patient states she ended up having to go to the ER and she was diagnosed with Sepsis and was prescribed pain medication. Patient states she was informed to follow up with PCP and have lab work and then discuss next steps with pain medication.    Back Pain    Patient states she has still having back pain and states the pain radiates around to her abdomen.    Transition of Care Hospital Follow up.   Follow-up today due to recent hospitalization for UTI with sepsis.  Admitted on 01/14/21 to [facility].  Prior to hospitalization she was having back pain due to injury, followed by ortho and reports having MRI today -- recent lumbar MRI was normal on 01/15/21.  CT renal study in hospital did note 3 non obstructing less then 3 mm right renal calculi -- without contrast study.  She continue to report ongoing back pain that radiates to abdomen, states that the Norco she was sent home with at discharge is not touching it and that she is losing weight because of pain -- can not eat.  Since April 2022 she has lost 10 pounds.  In hospital they recommended GI referral due to weight loss.  Is eating 3 meals a day at this time (small amounts because she feels full quickly) and drinking 3 Ensures as day.    She denies an dysuria and reports less odor to urine.  Denies any frequency, hematuria, fever, or urgency.  States she continues to have back pain, radiating to abdomen -- sharp and aching to right side.  Rates this as 6/10 -- taking Norco every 12 hours, which helps a little bit, makes pain bearable.  Pain returns in 4 hours after pain medication.  Lidocaine patches help pain at  night.  Heating pad, Meloxicam, and muscle relaxers do not help. Pain is worse when she is sleeping or bending/lifting.  Does endorse some nausea when pain present.  Has had some headaches, but these are improving.  Took Fioricet in hospital which offered benefit and she would like refills, as has taken in past.  Does have history of stent to left kidney due to kidney stones.  Recommendations for Outpatient Follow-up:  Follow up with PCP in 1-2 weeks Please obtain BMP/CBC in one week Please follow up on the following pending results:  "HPI: Megan Bentley is a 35 y.o. 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  She rates her pain a 5 to 6 x 10 in intensity at its worst.  Pain is nonradiating and is worse with movement.  Over the last 7 days she developed fever, chills, dysuria, frequency, nausea and anorexia. She saw an orthopedic surgeon yesterday for evaluation of her low back pain and was given Mobic. She has also had loose watery stools but denies having any emesis, no cough, no shortness  of breath, no chest pain, no dizziness, no lightheadedness, no headache, no palpitations, no diaphoresis, no leg swelling.  Labs show sodium 135, potassium 3.7, chloride 99, bicarb 22, glucose 133, BUN 16, creatinine 0.72, calcium 8.8, alkaline phosphatase 70, albumin 3.0, AST 26, ALT 33, total protein 7.0, lactic acid 3.2 >> 0.8, white count 22,000, hemoglobin 11.2, hematocrit 33.2, MCV 97.1, RDW 14.4, platelet count 485 Respiratory viral panel is negative Renal stone study shows nonobstructing right renal calculi. Twelve-lead EKG reviewed by me shows sinus tachycardia with nonspecific T wave changes.   ED Course: Patient is a 35 year old female who presents to the for evaluation of fever, chills, right lower back pain as well as urinary  symptoms. She was febrile when she arrived to the ER and was tachycardic.  Lactic acid was elevated at 3.2 and she has pyuria. She received sepsis fluids in the ER with a downward trend of her lactic acid level. She will be admitted to the hospital for further evaluation."   Hospital/Facility: [facility] D/C Physician: Dr. Blake Divine D/C Date: 01/18/21  Records Requested: 01/20/21 Records Received: 01/20/21 Records Reviewed: 01/20/21  Diagnoses on Discharge: Sepsis secondary UTI  Date of interactive Contact within 48 hours of discharge:  Contact was through: phone  Date of 7 day or 14 day face-to-face visit:    within 7 days  Outpatient Encounter Medications as of 01/20/2021  Medication Sig   butalbital-acetaminophen-caffeine (FIORICET) 50-325-40 MG tablet Take 1 tablet by mouth every 6 (six) hours as needed for headache.   cephALEXin (KEFLEX) 500 MG capsule Take 1 capsule (500 mg total) by mouth 2 (two) times daily for 3 days.   clonazePAM (KLONOPIN) 0.5 MG tablet Take 0.5 tablets (0.25 mg total) by mouth 2 (two) times daily as needed for anxiety.   cyclobenzaprine (FLEXERIL) 5 MG tablet Take 1 tablet (5 mg total) by mouth 3 (three) times daily as needed for muscle spasms.   feeding supplement (ENSURE ENLIVE / ENSURE PLUS) LIQD Take 237 mLs by mouth 3 (three) times daily between meals.   gabapentin (NEURONTIN) 100 MG capsule Take 100 mg twice a day for 1 week, then increase to 200 mg twice a day   Multiple Vitamin (MULTIVITAMIN WITH MINERALS) TABS tablet Take 1 tablet by mouth daily.   nortriptyline (PAMELOR) 10 MG capsule Take 20 mg by mouth at bedtime.   ondansetron (ZOFRAN ODT) 4 MG disintegrating tablet Take 1 tablet (4 mg total) by mouth every 8 (eight) hours as needed for nausea or vomiting.   HYDROcodone-acetaminophen (NORCO/VICODIN) 5-325 MG tablet Take 1 tablet by mouth every 6 (six) hours as needed for up to 5 days for severe pain.   [DISCONTINUED] HYDROcodone-acetaminophen (NORCO/VICODIN)  5-325 MG tablet Take 1 tablet by mouth every 12 (twelve) hours as needed for up to 5 days for severe pain. (Patient not taking: Reported on 01/20/2021)   [DISCONTINUED] meloxicam (MOBIC) 15 MG tablet Take 15 mg by mouth daily. (Patient not taking: Reported on 01/20/2021)   No facility-administered encounter medications on file as of 01/20/2021.    Diagnostic Tests Reviewed/Disposition: Labs on discharge noted H/H 10.0/30.2, PLT 591 (was ranging 473 to 591 and prior to admission was in 200 range), iron 74, electrolytes unremarkable,  Consults: none  Discharge Instructions: Follow-up with PCP  Disease/illness Education: Discussed with patient today  Home Health/Community Services Discussions/Referrals: none  Establishment or re-establishment of referral orders for community resources: none  Discussion with other health care providers: reviewed notes  Assessment and Support of treatment  regimen adherence: Discussed with patient today  Appointments Coordinated with: Discussed with patient today  Education for self-management, independent living, and ADLs:  Discussed with patient today  Relevant past medical, surgical, family and social history reviewed and updated as indicated. Interim medical history since our last visit reviewed. Allergies and medications reviewed and updated.  Review of Systems  Constitutional:  Positive for fatigue and unexpected weight change. Negative for activity change, appetite change, chills and fever.  Respiratory:  Negative for cough, chest tightness, shortness of breath and wheezing.   Cardiovascular:  Negative for chest pain, palpitations and leg swelling.  Gastrointestinal:  Positive for abdominal pain (right lower quadrant radiating from back) and nausea. Negative for abdominal distention, anal bleeding, constipation, diarrhea, rectal pain and vomiting.  Musculoskeletal:  Negative for myalgias.  Neurological:  Positive for weakness and headaches. Negative for  dizziness, tremors, seizures, syncope, light-headedness and numbness.  Psychiatric/Behavioral: Negative.     Per HPI unless specifically indicated above     Objective:    BP 119/73   Pulse 96   Temp 98.9 F (37.2 C) (Oral)   Wt 88 lb 3.2 oz (40 kg)   LMP 01/07/2021   SpO2 96%   BMI 15.14 kg/m   Wt Readings from Last 3 Encounters:  01/20/21 88 lb 3.2 oz (40 kg)  01/14/21 90 lb (40.8 kg)  10/09/20 98 lb 6.4 oz (44.6 kg)    Physical Exam Vitals and nursing note reviewed.  Constitutional:      General: She is awake. She is not in acute distress.    Appearance: She is well-developed and underweight. She is ill-appearing. She is not toxic-appearing.  HENT:     Head: Normocephalic.     Right Ear: Hearing, ear canal and external ear normal.     Left Ear: Hearing, ear canal and external ear normal.     Nose: Nose normal.     Mouth/Throat:     Mouth: Mucous membranes are moist.  Eyes:     General: Lids are normal.        Right eye: No discharge.        Left eye: No discharge.     Conjunctiva/sclera: Conjunctivae normal.     Pupils: Pupils are equal, round, and reactive to light.  Neck:     Thyroid: No thyromegaly.     Vascular: No carotid bruit.  Cardiovascular:     Rate and Rhythm: Normal rate and regular rhythm.     Heart sounds: Normal heart sounds. No murmur heard.   No gallop.  Pulmonary:     Effort: Pulmonary effort is normal. No accessory muscle usage or respiratory distress.     Breath sounds: Normal breath sounds.  Abdominal:     General: Bowel sounds are normal. There is no distension.     Palpations: Abdomen is soft. There is no hepatomegaly.     Tenderness: abdominal tenderness in the right lower quadrant There is no right CVA tenderness, left CVA tenderness or guarding. Negative signs include psoas sign and obturator sign.     Hernia: No hernia is present.  Musculoskeletal:     Cervical back: Normal range of motion and neck supple.     Right lower leg: No  edema.     Left lower leg: No edema.  Lymphadenopathy:     Cervical: No cervical adenopathy.  Skin:    General: Skin is warm and dry.  Neurological:     Mental Status: She is alert and oriented  to person, place, and time.  Psychiatric:        Attention and Perception: Attention normal.        Mood and Affect: Mood normal.        Speech: Speech normal.        Behavior: Behavior normal. Behavior is cooperative.        Thought Content: Thought content normal.    Results for orders placed or performed in visit on 01/20/21  WET PREP FOR TRICH, YEAST, CLUE   Specimen: Sterile Swab   Sterile Swab  Result Value Ref Range   Trichomonas Exam Negative Negative   Yeast Exam Negative Negative   Clue Cell Exam Negative Negative  Microscopic Examination   Urine  Result Value Ref Range   WBC, UA 0-5 0 - 5 /hpf   RBC None seen 0 - 2 /hpf   Epithelial Cells (non renal) 0-10 0 - 10 /hpf   Bacteria, UA Many (A) None seen/Few  Urinalysis, Routine w reflex microscopic  Result Value Ref Range   Specific Gravity, UA 1.010 1.005 - 1.030   pH, UA 6.0 5.0 - 7.5   Color, UA Yellow Yellow   Appearance Ur Cloudy (A) Clear   Leukocytes,UA 1+ (A) Negative   Protein,UA Negative Negative/Trace   Glucose, UA Negative Negative   Ketones, UA Negative Negative   RBC, UA Negative Negative   Bilirubin, UA Negative Negative   Urobilinogen, Ur 0.2 0.2 - 1.0 mg/dL   Nitrite, UA Negative Negative   Microscopic Examination See below:       Assessment & Plan:   Problem List Items Addressed This Visit       Cardiovascular and Mediastinum   Migraine with aura and without status migrainosus, not intractable    Chronic with improved symptoms on current medication regimen.  Continue collaboration with neurology and current regimen as prescribed by them.  Will refill Fioricet, which has offered her benefit when needed.  Recommend minimal use of this.         Relevant Medications   HYDROcodone-acetaminophen  (NORCO/VICODIN) 5-325 MG tablet   butalbital-acetaminophen-caffeine (FIORICET) 50-325-40 MG tablet     Genitourinary   Recurrent nephrolithiasis    Currently with 3 non obstructing to right kidney, however concern this may be causing her discomfort currently.  She has history of stones to left side with stent placement.  At this time will send in Zofran for nausea and refill Norco with increase in frequency she can use to Q6H, as this is offering benefit (5 day supply sent).  Unable to provide Flomax due to sulfa allergy, which she is aware of as has been told this before.  Urgent referral to urology to further assess.  Return in one week.       Relevant Medications   HYDROcodone-acetaminophen (NORCO/VICODIN) 5-325 MG tablet   Other Relevant Orders   Ambulatory referral to Urology     Other   Back pain    Acute and ongoing, continue collaboration with ortho.  Refer to nephrolithiasis plan of care for further.       Relevant Medications   HYDROcodone-acetaminophen (NORCO/VICODIN) 5-325 MG tablet   butalbital-acetaminophen-caffeine (FIORICET) 50-325-40 MG tablet   Sepsis secondary to UTI (HCC) - Primary    Acute and improving, although some ongoing waxing and waning right back/abdomen pain (?related to kidney stones).  Will obtain urine sample outpatient, unable to void in office today + check UDS.  Wet prep negative.  At this time will send  in Zofran for nausea and refill Norco with increase in frequency she can use to Q6H, as this is offering benefit (5 day supply sent).  Unable to provide Flomax due to sulfa allergy, which she is aware of as has been told this before.  Urgent referral to urology to further assess.  Return in one week, sooner if worsening symptoms.  CBC and CMP today.       Relevant Orders   CBC with Differential/Platelet   Comprehensive metabolic panel   WET PREP FOR TRICH, YEAST, CLUE (Completed)   Ambulatory referral to Urology   Urine Culture   Urinalysis,  Routine w reflex microscopic   [id number] 11+Oxyco+Alc+Crt-Bund   Hypokalemia    Recheck level today, recent at discharge was stable.       Sepsis due to Escherichia coli without acute organ dysfunction (HCC)    Acute and improving, although some ongoing waxing and waning right back/abdomen pain (?related to kidney stones).  Will obtain urine sample outpatient, unable to void in office today.  Wet prep negative.  At this time will send in Zofran for nausea and refill Norco with increase in frequency she can use to Q6H, as this is offering benefit (5 day supply sent).  Unable to provide Flomax due to sulfa allergy, which she is aware of as has been told this before.  Urgent referral to urology to further assess.  Return in one week, sooner if worsening symptoms.       Relevant Orders   WET PREP FOR TRICH, YEAST, CLUE (Completed)   Ambulatory referral to Urology   Protein-calorie malnutrition, moderate (HCC)    Refer to unintended weight loss plan of care for further.       Unintended weight loss    Has lost 10 pounds since April 28th, 2022.  Reports eating three small meals a day and drinking Ensure TID, continue this.  CBC and CMP today.  ?kidney stones, referral to urology placed.  Nutritionist in hospital recommended referral to GI, will place this today.  May benefit from celiac panel, unable to obtain today.  Return in one week for weight check.       Relevant Orders   Ambulatory referral to Gastroenterology   Other Visit Diagnoses     Need for hepatitis C screening test       Hep C screening on labs today, per guideline recommendations.  Discussed with patient.   Relevant Orders   Hepatitis C antibody        Follow up plan: Return in about 1 week (around 01/27/2021) for Weight loss and pain.

## 2021-01-20 NOTE — Assessment & Plan Note (Signed)
Chronic with improved symptoms on current medication regimen.  Continue collaboration with neurology and current regimen as prescribed by them.  Will refill Fioricet, which has offered her benefit when needed.  Recommend minimal use of this.

## 2021-01-20 NOTE — Telephone Encounter (Signed)
Pt wanted you to know that her MRI showed an Enlarged Liver and something going on with a disc in her back. She also stated she had a medication that would need your approval. She want you to please advise her. Thanks  Pt also wants to know if she can have a CT of her Liver done as well.

## 2021-01-20 NOTE — Assessment & Plan Note (Signed)
Currently with 3 non obstructing to right kidney, however concern this may be causing her discomfort currently.  She has history of stones to left side with stent placement.  At this time will send in Zofran for nausea and refill Norco with increase in frequency she can use to Q6H, as this is offering benefit (5 day supply sent).  Unable to provide Flomax due to sulfa allergy, which she is aware of as has been told this before.  Urgent referral to urology to further assess.  Return in one week.

## 2021-01-20 NOTE — Assessment & Plan Note (Signed)
Acute and improving, although some ongoing waxing and waning right back/abdomen pain (?related to kidney stones).  Will obtain urine sample outpatient, unable to void in office today.  Wet prep negative.  At this time will send in Zofran for nausea and refill Norco with increase in frequency she can use to Q6H, as this is offering benefit (5 day supply sent).  Unable to provide Flomax due to sulfa allergy, which she is aware of as has been told this before.  Urgent referral to urology to further assess.  Return in one week, sooner if worsening symptoms.

## 2021-01-20 NOTE — Assessment & Plan Note (Signed)
Refer to unintended weight loss plan of care for further.

## 2021-01-20 NOTE — Assessment & Plan Note (Signed)
Acute and ongoing, continue collaboration with ortho.  Refer to nephrolithiasis plan of care for further.

## 2021-01-20 NOTE — Telephone Encounter (Signed)
Spoke with pharmacist representative and provided the verbal OK for patient prescription for Hydrocodone to be filled. Pharmacist verbalized understanding and has no further questions.

## 2021-01-21 ENCOUNTER — Other Ambulatory Visit: Payer: Self-pay

## 2021-01-21 ENCOUNTER — Ambulatory Visit
Admission: RE | Admit: 2021-01-21 | Discharge: 2021-01-21 | Disposition: A | Discharge status: Home / Self Care | Payer: Medicaid Other | Source: Ambulatory Visit | Attending: Nurse Practitioner | Admitting: Nurse Practitioner

## 2021-01-21 DIAGNOSIS — R634 Abnormal weight loss: Secondary | ICD-10-CM | POA: Insufficient documentation

## 2021-01-21 DIAGNOSIS — R7989 Other specified abnormal findings of blood chemistry: Secondary | ICD-10-CM | POA: Diagnosis not present

## 2021-01-21 DIAGNOSIS — N200 Calculus of kidney: Secondary | ICD-10-CM | POA: Diagnosis not present

## 2021-01-21 LAB — CBC WITH DIFFERENTIAL/PLATELET
Basophils Absolute: 0 10*3/uL (ref 0.0–0.2)
Basos: 1 %
EOS (ABSOLUTE): 0.1 10*3/uL (ref 0.0–0.4)
Eos: 1 %
Hematocrit: 33.1 % — ABNORMAL LOW (ref 34.0–46.6)
Hemoglobin: 10.8 g/dL — ABNORMAL LOW (ref 11.1–15.9)
Immature Grans (Abs): 0.1 10*3/uL (ref 0.0–0.1)
Immature Granulocytes: 1 %
Lymphocytes Absolute: 2 10*3/uL (ref 0.7–3.1)
Lymphs: 34 %
MCH: 31.5 pg (ref 26.6–33.0)
MCHC: 32.6 g/dL (ref 31.5–35.7)
MCV: 97 fL (ref 79–97)
Monocytes Absolute: 0.3 10*3/uL (ref 0.1–0.9)
Monocytes: 5 %
Neutrophils Absolute: 3.4 10*3/uL (ref 1.4–7.0)
Neutrophils: 58 %
Platelets: 827 10*3/uL (ref 150–450)
RBC: 3.43 x10E6/uL — ABNORMAL LOW (ref 3.77–5.28)
RDW: 12.9 % (ref 11.7–15.4)
WBC: 5.9 10*3/uL (ref 3.4–10.8)

## 2021-01-21 LAB — COMPREHENSIVE METABOLIC PANEL
ALT: 27 IU/L (ref 0–32)
AST: 13 IU/L (ref 0–40)
Albumin/Globulin Ratio: 1.3 (ref 1.2–2.2)
Albumin: 4.1 g/dL (ref 3.8–4.8)
Alkaline Phosphatase: 82 IU/L (ref 44–121)
BUN/Creatinine Ratio: 29 — ABNORMAL HIGH (ref 9–23)
BUN: 19 mg/dL (ref 6–20)
Bilirubin Total: <0.2 mg/dL (ref 0.0–1.2)
CO2: 26 mmol/L (ref 20–29)
Calcium: 10 mg/dL (ref 8.7–10.2)
Chloride: 101 mmol/L (ref 96–106)
Creatinine, Ser: 0.66 mg/dL (ref 0.57–1.00)
Globulin, Total: 3.2 g/dL (ref 1.5–4.5)
Glucose: 81 mg/dL (ref 65–99)
Potassium: 5.1 mmol/L (ref 3.5–5.2)
Sodium: 142 mmol/L (ref 134–144)
Total Protein: 7.3 g/dL (ref 6.0–8.5)
eGFR: 119 mL/min/{1.73_m2} (ref >59)

## 2021-01-21 LAB — HEPATITIS C ANTIBODY: Hep C Virus Ab: <0.1 s/co ratio (ref 0.0–0.9)

## 2021-01-21 LAB — TISSUE TRANSGLUTAMINASE, IGA: Tissue Transglutaminase Ab, IgA: <2 U/mL (ref 0–3)

## 2021-01-21 NOTE — Progress Notes (Signed)
I have placed a STAT CT abdomen/pelvis order in for patient and a urgent referral to hematology.  Will alert [name] here too.  MyChart messaged patient.  Please ensure she got this message: Good morning Megan Bentley, your labs have returned and platelet count remains elevated, in fact is trending up. I would like you to start on a Baby ASA 81 MG daily today. I am putting in an urgent referral to hematology to further assess your anemia and platelet count. I will also place urgent CT scan of abdomen. I do not have your MRI result, other then lumbar spine (which was normal), from ortho. But based on this ongoing elevation I would like to assess further abdomen, especially with your weight loss. Liver function onlabs is normal and Hep C is negative. Any questions? Keep being stellar!! Thank you for allowing me to participate in your care. Iappreciate you. Kindest regards, Kenyatta Keidel

## 2021-01-21 NOTE — Addendum Note (Signed)
Addended by: Aura Dials T on: 01/21/2021 08:29 AM   Modules accepted: Orders

## 2021-01-21 NOTE — Progress Notes (Signed)
Contacted via MyChart

## 2021-01-21 NOTE — Telephone Encounter (Signed)
Pt dropped off disc for Megan Bentley to see from [facility].Marland KitchenMarland KitchenPlaced in [facility]

## 2021-01-21 NOTE — Telephone Encounter (Signed)
Noted.  Thank you, will review and have contacted patient this morning about urgent referrals.

## 2021-01-22 ENCOUNTER — Ambulatory Visit (INDEPENDENT_AMBULATORY_CARE_PROVIDER_SITE_OTHER): Payer: Medicaid Other | Admitting: Urology

## 2021-01-22 ENCOUNTER — Encounter: Payer: Self-pay | Admitting: Urology

## 2021-01-22 ENCOUNTER — Telehealth: Payer: Self-pay

## 2021-01-22 VITALS — BP 122/80 | HR 101 | Ht 64.0 in | Wt 89.8 lb

## 2021-01-22 DIAGNOSIS — A419 Sepsis, unspecified organism: Secondary | ICD-10-CM | POA: Diagnosis not present

## 2021-01-22 DIAGNOSIS — N390 Urinary tract infection, site not specified: Secondary | ICD-10-CM

## 2021-01-22 DIAGNOSIS — N200 Calculus of kidney: Secondary | ICD-10-CM | POA: Diagnosis not present

## 2021-01-22 LAB — URINE CULTURE: Organism ID, Bacteria: NO GROWTH

## 2021-01-22 LAB — BLADDER SCAN AMB NON-IMAGING

## 2021-01-22 MED ORDER — NITROFURANTOIN MACROCRYSTAL 50 MG PO CAPS: 50.0000 mg | ORAL_CAPSULE | Freq: Every day | ORAL | 0 refills | Status: DC

## 2021-01-22 NOTE — Progress Notes (Signed)
01/22/21 12:22 PM   Megan Bentley, Megan Bentley [id number]  CC: Recurrent UTIs, history of nephrolithiasis  HPI: I saw Megan Bentley for the above issues in clinic today.  She was recently admitted with UTI and presumed sepsis from urinary source.  CT at that time showed no hydronephrosis, and punctate nonobstructing renal stones.  A CT was repeated again just a few days ago and again showed normal kidneys, but severe constipation.  Her primary complaints today are recurrent urinary infections, abdominal and back pain, and weight loss.  Most recent urine culture on 01/20/2021 showed no growth.  She has a history of nonobstructing nephrolithiasis previously treated by Dr. Apolinar Bentley in 2017.    PVR today normal at 9 mL.   PMH: Past Medical History:  Diagnosis Date   Allergy    Anxiety    Arthritis    Depression    History of kidney stones    Kidney stone    Migraines    Pneumonia    PONV (postoperative nausea and vomiting)    Preeclampsia    Renal disorder    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HCC)    Sepsis (HCC)     Surgical History: Past Surgical History:  Procedure Laterality Date   CYSTOSCOPY W/ RETROGRADES Bilateral 08/06/2015   Procedure: CYSTOSCOPY WITH RETROGRADE PYELOGRAM;  Surgeon: [name], MD;  Location: [facility];  Service: Urology;  Laterality: Bilateral;   CYSTOSCOPY/URETEROSCOPY/HOLMIUM LASER/STENT PLACEMENT Left 08/06/2015   Procedure: CYSTOSCOPY/URETEROSCOPY/HOLMIUM LASER/STENT PLACEMENT;  Surgeon: [name], MD;  Location: [facility];  Service: Urology;  Laterality: Left;     Family History: Family History  Problem Relation Age of Onset   Multiple sclerosis Other    Sjogren's syndrome Other        Aunt kim   Kidney Stones Other    Anxiety disorder Mother    Multiple sclerosis Mother    Raynaud syndrome Father    Anxiety disorder Father    Diabetes Father    Anxiety disorder Maternal Grandmother    COPD Maternal Grandmother    Heart disease Maternal  Grandfather    Multiple sclerosis Sister    Lung disease Paternal Grandmother    Autoimmune disease Paternal Grandmother    Kidney disease Neg Hx    Bladder Cancer Neg Hx     Social History:  reports that she has been smoking e-cigarettes and cigarettes. She has a 3.30 pack-year smoking history. She has never used smokeless tobacco. She reports that she does not currently use drugs after having used the following drugs: Marijuana. She reports that she does not drink alcohol.  Physical Exam: BP 122/80 (BP Location: Left Arm, Patient Position: Sitting, Cuff Size: Small)   Pulse (!) 101   Ht 5\' 4"  (1.626 m)   Wt 89 lb 12.8 oz (40.7 kg)   LMP 01/07/2021   BMI 15.41 kg/m    Constitutional: Extremely thin, tearful Cardiovascular: No clubbing, cyanosis, or edema. Respiratory: Normal respiratory effort, no increased work of breathing. GI: Abdomen is soft, nontender, nondistended, no abdominal masses   Laboratory Data: Reviewed, see HPI  Pertinent Imaging: I have personally viewed and interpreted both CTs from 01/14/2021 and yesterday 01/21/2021.  No hydronephrosis or ureteral stones, punctate very small nonobstructing renal stones  Assessment & Plan:   35 year old female with a number of comorbidities who presents with weight loss, abdominal and back pain, and recurrent UTIs.  CT shows no urologic abnormalities, but significant constipation on most recent CT yesterday.  I think this is likely  contributing to her pain and possible UTIs as well.  I would not recommend any intervention for her small nonobstructing nephrolithiasis, and I think this is unlikely to be related to her prior infections, or any of her acute pain symptoms.  We discussed the evaluation and treatment of patients with recurrent UTIs at length.  We specifically discussed the differences between asymptomatic bacteriuria and true urinary tract infection.  We discussed the AUA definition of recurrent UTI of at least 2 culture  proven symptomatic acute cystitis episodes in a 54-month period, or 3 within a 1 year period.  We discussed the importance of culture directed antibiotic treatment, and antibiotic stewardship.  First-line therapy includes nitrofurantoin(5 days), Bactrim(3 days), or fosfomycin(3 g single dose).  Possible etiologies of recurrent infection include periurethral tissue atrophy in postmenopausal woman, constipation, sexual activity, incomplete emptying, anatomic abnormalities, and even genetic predisposition.  Finally, we discussed the role of perineal hygiene, timed voiding, adequate hydration, topical vaginal estrogen, cranberry prophylaxis, and low-dose antibiotic prophylaxis.  -Strategies regarding constipation discussed at length -I recommend starting with cranberry tablets 1-2 times daily, as well as a 35-month course of very low-dose nitrofurantoin daily to see if we can break the cycle of her recurrent infections. -RTC 3 to 4 months symptom check  Legrand Rams, MD 01/22/2021  [facility] [address], [address] [place], [place] [address] [phone]

## 2021-01-22 NOTE — Progress Notes (Signed)
Contacted via MyChart   Good morning, urine culture is negative.

## 2021-01-22 NOTE — Telephone Encounter (Signed)
Copied from CRM [id number]. Topic: General - Other >> Jan 21, 2021  3:33 PM Megan Bentley A wrote: Reason for CRM: Patient called to share that they still haven't heard from the hematologist's office, as they were expecting to   The patient would like to know if a member of the practice can assist with coordination   Please contact further when possible

## 2021-01-25 LAB — DRUG SCREEN 764883 11+OXYCO+ALC+CRT-BUND
Amphetamines, Urine: NEGATIVE ng/mL
BENZODIAZ UR QL: NEGATIVE ng/mL
Barbiturate: NEGATIVE ng/mL
Cannabinoid Quant, Ur: NEGATIVE ng/mL
Cocaine (Metabolite): NEGATIVE ng/mL
Creatinine: 24 mg/dL (ref 20.0–300.0)
Ethanol: NEGATIVE %
Meperidine: NEGATIVE ng/mL
Methadone Screen, Urine: NEGATIVE ng/mL
Oxycodone/Oxymorphone, Urine: NEGATIVE ng/mL
Phencyclidine: NEGATIVE ng/mL
Propoxyphene: NEGATIVE ng/mL
Tramadol: NEGATIVE ng/mL
pH, Urine: 6 (ref 4.5–8.9)

## 2021-01-25 LAB — OPIATES CONFIRMATION, URINE: Opiates: NEGATIVE ng/mL

## 2021-01-26 ENCOUNTER — Telehealth: Payer: Self-pay | Admitting: Nurse Practitioner

## 2021-01-26 DIAGNOSIS — F329 Major depressive disorder, single episode, unspecified: Secondary | ICD-10-CM | POA: Diagnosis not present

## 2021-01-26 MED ORDER — BUTALBITAL-APAP-CAFFEINE 50-325-40 MG PO TABS: 1.0000 | ORAL_TABLET | Freq: Four times a day (QID) | ORAL | 0 refills | Status: DC | PRN

## 2021-01-26 MED ORDER — HYDROCODONE-ACETAMINOPHEN 5-325 MG PO TABS: 1.0000 | ORAL_TABLET | Freq: Four times a day (QID) | ORAL | 0 refills | Status: DC | PRN

## 2021-01-26 NOTE — Telephone Encounter (Signed)
Routing to provider to advise.  

## 2021-01-26 NOTE — Addendum Note (Signed)
Addended by: Aura Dials T on: 01/26/2021 09:20 AM   Modules accepted: Orders

## 2021-01-26 NOTE — Telephone Encounter (Signed)
Pt is asking for a last refill for  HYDROcodone-acetaminophen (NORCO/VICODIN) 5-325 MG tablet  until she sees a specialist/ pt stated its hard to deal with the pain and can barely pick up her daughter/ states it been severe since getting out of the hospital / pt has an appt scheduled for the 18th but states she would like to see if [name] would be willing to send the refill today/ please advise   [id number] [address]  [address], [place] [address]  Phone:  [phone]  Fax:  [phone]

## 2021-01-27 DIAGNOSIS — F329 Major depressive disorder, single episode, unspecified: Secondary | ICD-10-CM | POA: Diagnosis not present

## 2021-01-28 ENCOUNTER — Other Ambulatory Visit: Payer: Medicaid Other

## 2021-01-28 ENCOUNTER — Inpatient Hospital Stay: Payer: Medicaid Other | Attending: Oncology | Admitting: Oncology

## 2021-01-28 ENCOUNTER — Other Ambulatory Visit: Payer: Self-pay

## 2021-01-28 ENCOUNTER — Encounter: Payer: Self-pay | Admitting: Oncology

## 2021-01-28 VITALS — BP 116/76 | HR 94 | Temp 97.5°F | Resp 14 | Wt 91.7 lb

## 2021-01-28 DIAGNOSIS — F17210 Nicotine dependence, cigarettes, uncomplicated: Secondary | ICD-10-CM | POA: Diagnosis not present

## 2021-01-28 DIAGNOSIS — Z79899 Other long term (current) drug therapy: Secondary | ICD-10-CM | POA: Insufficient documentation

## 2021-01-28 DIAGNOSIS — R200 Anesthesia of skin: Secondary | ICD-10-CM | POA: Diagnosis not present

## 2021-01-28 DIAGNOSIS — R519 Headache, unspecified: Secondary | ICD-10-CM | POA: Insufficient documentation

## 2021-01-28 DIAGNOSIS — Z8269 Family history of other diseases of the musculoskeletal system and connective tissue: Secondary | ICD-10-CM | POA: Insufficient documentation

## 2021-01-28 DIAGNOSIS — D75839 Thrombocytosis, unspecified: Secondary | ICD-10-CM

## 2021-01-28 DIAGNOSIS — M545 Low back pain, unspecified: Secondary | ICD-10-CM | POA: Insufficient documentation

## 2021-01-28 DIAGNOSIS — Z833 Family history of diabetes mellitus: Secondary | ICD-10-CM | POA: Diagnosis not present

## 2021-01-28 DIAGNOSIS — Z87442 Personal history of urinary calculi: Secondary | ICD-10-CM | POA: Diagnosis not present

## 2021-01-28 DIAGNOSIS — Z880 Allergy status to penicillin: Secondary | ICD-10-CM | POA: Insufficient documentation

## 2021-01-28 DIAGNOSIS — K5900 Constipation, unspecified: Secondary | ICD-10-CM | POA: Insufficient documentation

## 2021-01-28 DIAGNOSIS — G4489 Other headache syndrome: Secondary | ICD-10-CM | POA: Diagnosis not present

## 2021-01-28 DIAGNOSIS — Z832 Family history of diseases of the blood and blood-forming organs and certain disorders involving the immune mechanism: Secondary | ICD-10-CM | POA: Diagnosis not present

## 2021-01-28 DIAGNOSIS — E46 Unspecified protein-calorie malnutrition: Secondary | ICD-10-CM | POA: Diagnosis not present

## 2021-01-28 DIAGNOSIS — R5382 Chronic fatigue, unspecified: Secondary | ICD-10-CM | POA: Diagnosis not present

## 2021-01-28 DIAGNOSIS — Z8249 Family history of ischemic heart disease and other diseases of the circulatory system: Secondary | ICD-10-CM | POA: Diagnosis not present

## 2021-01-28 DIAGNOSIS — R110 Nausea: Secondary | ICD-10-CM | POA: Insufficient documentation

## 2021-01-28 DIAGNOSIS — D649 Anemia, unspecified: Secondary | ICD-10-CM

## 2021-01-28 DIAGNOSIS — Z882 Allergy status to sulfonamides status: Secondary | ICD-10-CM | POA: Insufficient documentation

## 2021-01-28 DIAGNOSIS — F32A Depression, unspecified: Secondary | ICD-10-CM | POA: Diagnosis not present

## 2021-01-28 DIAGNOSIS — E876 Hypokalemia: Secondary | ICD-10-CM | POA: Diagnosis not present

## 2021-01-28 DIAGNOSIS — Z836 Family history of other diseases of the respiratory system: Secondary | ICD-10-CM | POA: Insufficient documentation

## 2021-01-28 DIAGNOSIS — R202 Paresthesia of skin: Secondary | ICD-10-CM | POA: Diagnosis not present

## 2021-01-28 DIAGNOSIS — Z87440 Personal history of urinary (tract) infections: Secondary | ICD-10-CM | POA: Insufficient documentation

## 2021-01-28 DIAGNOSIS — N920 Excessive and frequent menstruation with regular cycle: Secondary | ICD-10-CM | POA: Insufficient documentation

## 2021-01-28 DIAGNOSIS — F419 Anxiety disorder, unspecified: Secondary | ICD-10-CM | POA: Diagnosis not present

## 2021-01-28 DIAGNOSIS — R42 Dizziness and giddiness: Secondary | ICD-10-CM | POA: Diagnosis not present

## 2021-01-28 LAB — COMPREHENSIVE METABOLIC PANEL
ALT: 27 U/L (ref 0–44)
AST: 17 U/L (ref 15–41)
Albumin: 4.1 g/dL (ref 3.5–5.0)
Alkaline Phosphatase: 61 U/L (ref 38–126)
Anion gap: 9 (ref 5–15)
BUN: 25 mg/dL — ABNORMAL HIGH (ref 6–20)
CO2: 26 mmol/L (ref 22–32)
Calcium: 9.2 mg/dL (ref 8.9–10.3)
Chloride: 100 mmol/L (ref 98–111)
Creatinine, Ser: 0.59 mg/dL (ref 0.44–1.00)
GFR, Estimated: >60 mL/min (ref >60)
Glucose, Bld: 91 mg/dL (ref 70–99)
Potassium: 4 mmol/L (ref 3.5–5.1)
Sodium: 135 mmol/L (ref 135–145)
Total Bilirubin: 0.4 mg/dL (ref 0.3–1.2)
Total Protein: 7.7 g/dL (ref 6.5–8.1)

## 2021-01-28 LAB — RETICULOCYTES
Immature Retic Fract: 13 % (ref 2.3–15.9)
RBC.: 3.52 MIL/uL — ABNORMAL LOW (ref 3.87–5.11)
Retic Count, Absolute: 140.8 10*3/uL (ref 19.0–186.0)
Retic Ct Pct: 4 % — ABNORMAL HIGH (ref 0.4–3.1)

## 2021-01-28 LAB — CBC WITH DIFFERENTIAL/PLATELET
Abs Immature Granulocytes: 0.04 10*3/uL (ref 0.00–0.07)
Basophils Absolute: 0 10*3/uL (ref 0.0–0.1)
Basophils Relative: 1 %
Eosinophils Absolute: 0.1 10*3/uL (ref 0.0–0.5)
Eosinophils Relative: 3 %
HCT: 32.6 % — ABNORMAL LOW (ref 36.0–46.0)
Hemoglobin: 11.2 g/dL — ABNORMAL LOW (ref 12.0–15.0)
Immature Granulocytes: 1 %
Lymphocytes Relative: 40 %
Lymphs Abs: 2.2 10*3/uL (ref 0.7–4.0)
MCH: 32.3 pg (ref 26.0–34.0)
MCHC: 34.4 g/dL (ref 30.0–36.0)
MCV: 93.9 fL (ref 80.0–100.0)
Monocytes Absolute: 0.4 10*3/uL (ref 0.1–1.0)
Monocytes Relative: 7 %
Neutro Abs: 2.6 10*3/uL (ref 1.7–7.7)
Neutrophils Relative %: 48 %
Platelets: 402 10*3/uL — ABNORMAL HIGH (ref 150–400)
RBC: 3.47 MIL/uL — ABNORMAL LOW (ref 3.87–5.11)
RDW: 14.6 % (ref 11.5–15.5)
WBC: 5.4 10*3/uL (ref 4.0–10.5)
nRBC: 0 % (ref 0.0–0.2)

## 2021-01-28 LAB — SEDIMENTATION RATE: Sed Rate: 32 mm/hr — ABNORMAL HIGH (ref 0–20)

## 2021-01-28 LAB — LACTATE DEHYDROGENASE: LDH: 94 U/L — ABNORMAL LOW (ref 98–192)

## 2021-01-28 LAB — VITAMIN B12: Vitamin B-12: 571 pg/mL (ref 180–914)

## 2021-01-28 NOTE — Progress Notes (Signed)
Pt states that she has been having constant headaches especially after being discharged from ED 8/7. Also c/o fatigue, weakness,dizziness.States FIORICET does not seem to be helping anymore.

## 2021-01-29 ENCOUNTER — Telehealth (INDEPENDENT_AMBULATORY_CARE_PROVIDER_SITE_OTHER): Payer: Medicaid Other | Admitting: Nurse Practitioner

## 2021-01-29 ENCOUNTER — Encounter: Payer: Self-pay | Admitting: Oncology

## 2021-01-29 ENCOUNTER — Encounter: Payer: Self-pay | Admitting: Nurse Practitioner

## 2021-01-29 DIAGNOSIS — F419 Anxiety disorder, unspecified: Secondary | ICD-10-CM | POA: Diagnosis not present

## 2021-01-29 DIAGNOSIS — R55 Syncope and collapse: Secondary | ICD-10-CM | POA: Diagnosis not present

## 2021-01-29 DIAGNOSIS — M545 Low back pain, unspecified: Secondary | ICD-10-CM

## 2021-01-29 DIAGNOSIS — G43109 Migraine with aura, not intractable, without status migrainosus: Secondary | ICD-10-CM | POA: Diagnosis not present

## 2021-01-29 DIAGNOSIS — F32A Depression, unspecified: Secondary | ICD-10-CM

## 2021-01-29 LAB — FERRITIN: Ferritin: 62 ng/mL (ref 11–307)

## 2021-01-29 LAB — ANA COMPREHENSIVE PANEL
Anti JO-1: <0.2 AI (ref 0.0–0.9)
Centromere Ab Screen: <0.2 AI (ref 0.0–0.9)
Chromatin Ab SerPl-aCnc: <0.2 AI (ref 0.0–0.9)
ENA SM Ab Ser-aCnc: <0.2 AI (ref 0.0–0.9)
Ribonucleic Protein: 0.4 AI (ref 0.0–0.9)
SSA (Ro) (ENA) Antibody, IgG: <0.2 AI (ref 0.0–0.9)
SSB (La) (ENA) Antibody, IgG: <0.2 AI (ref 0.0–0.9)
Scleroderma (Scl-70) (ENA) Antibody, IgG: <0.2 AI (ref 0.0–0.9)
ds DNA Ab: <1 IU/mL (ref 0–9)

## 2021-01-29 LAB — TSH: TSH: 2.713 u[IU]/mL (ref 0.350–4.500)

## 2021-01-29 LAB — HAPTOGLOBIN: Haptoglobin: 182 mg/dL (ref 33–278)

## 2021-01-29 LAB — IRON AND TIBC
Iron: 95 ug/dL (ref 28–170)
Saturation Ratios: 23 % (ref 10.4–31.8)
TIBC: 416 ug/dL (ref 250–450)
UIBC: 321 ug/dL

## 2021-01-29 LAB — FOLATE: Folate: 21.4 ng/mL (ref >5.9)

## 2021-01-29 MED ORDER — TRAMADOL HCL 50 MG PO TABS: 25.0000 mg | ORAL_TABLET | Freq: Four times a day (QID) | ORAL | 0 refills | Status: AC | PRN

## 2021-01-29 NOTE — Assessment & Plan Note (Signed)
Chronic, ongoing, followed by neurology with recent increase in Klonopin, which she is not to increase until no longer taking pain medication for back.  She is aware of long term risks of chronic benzo use and aware this script needs to come from neurology, as they initiated this treatment.  Continue current regimen as prescribed by them.

## 2021-01-29 NOTE — Assessment & Plan Note (Signed)
Chronic with improved symptoms on current medication regimen.  Continue collaboration with neurology and current regimen as prescribed by them.

## 2021-01-29 NOTE — Patient Instructions (Signed)
Acute Back Pain, Adult Acute back pain is sudden and usually short-lived. It is often caused by an injury to the muscles and tissues in the back. The injury may result from: A muscle or ligament getting overstretched or torn (strained). Ligaments are tissues that connect bones to each other. Lifting something improperly can cause a back strain. Wear and tear (degeneration) of the spinal disks. Spinal disks are circular tissue that provide cushioning between the bones of the spine (vertebrae). Twisting motions, such as while playing sports or doing yard work. A hit to the back. Arthritis. You may have a physical exam, lab tests, and imaging tests to find the cause ofyour pain. Acute back pain usually goes away with rest and home care. Follow these instructions at home: Managing pain, stiffness, and swelling Treatment may include medicines for pain and inflammation that are taken by mouth or applied to the skin, prescription pain medicine, or muscle relaxants. Take over-the-counter and prescription medicines only as told by your health care provider. Your health care provider may recommend applying ice during the first 24-48 hours after your pain starts. To do this: Put ice in a plastic bag. Place a towel between your skin and the bag. Leave the ice on for 20 minutes, 2-3 times a day. If directed, apply heat to the affected area as often as told by your health care provider. Use the heat source that your health care provider recommends, such as a moist heat pack or a heating pad. Place a towel between your skin and the heat source. Leave the heat on for 20-30 minutes. Remove the heat if your skin turns bright red. This is especially important if you are unable to feel pain, heat, or cold. You have a greater risk of getting burned. Activity  Do not stay in bed. Staying in bed for more than 1-2 days can delay your recovery. Sit up and stand up straight. Avoid leaning forward when you sit or  hunching over when you stand. If you work at a desk, sit close to it so you do not need to lean over. Keep your chin tucked in. Keep your neck drawn back, and keep your elbows bent at a 90-degree angle (right angle). Sit high and close to the steering wheel when you drive. Add lower back (lumbar) support to your car seat, if needed. Take short walks on even surfaces as soon as you are able. Try to increase the length of time you walk each day. Do not sit, drive, or stand in one place for more than 30 minutes at a time. Sitting or standing for long periods of time can put stress on your back. Do not drive or use heavy machinery while taking prescription pain medicine. Use proper lifting techniques. When you bend and lift, use positions that put less stress on your back: Bend your knees. Keep the load close to your body. Avoid twisting. Exercise regularly as told by your health care provider. Exercising helps your back heal faster and helps prevent back injuries by keeping muscles strong and flexible. Work with a physical therapist to make a safe exercise program, as recommended by your health care provider. Do any exercises as told by your physical therapist.  Lifestyle Maintain a healthy weight. Extra weight puts stress on your back and makes it difficult to have good posture. Avoid activities or situations that make you feel anxious or stressed. Stress and anxiety increase muscle tension and can make back pain worse. Learn ways to manage   anxiety and stress, such as through exercise. General instructions Sleep on a firm mattress in a comfortable position. Try lying on your side with your knees slightly bent. If you lie on your back, put a pillow under your knees. Follow your treatment plan as told by your health care provider. This may include: Cognitive or behavioral therapy. Acupuncture or massage therapy. Meditation or yoga. Contact a health care provider if: You have pain that is not  relieved with rest or medicine. You have increasing pain going down into your legs or buttocks. Your pain does not improve after 2 weeks. You have pain at night. You lose weight without trying. You have a fever or chills. Get help right away if: You develop new bowel or bladder control problems. You have unusual weakness or numbness in your arms or legs. You develop nausea or vomiting. You develop abdominal pain. You feel faint. Summary Acute back pain is sudden and usually short-lived. Use proper lifting techniques. When you bend and lift, use positions that put less stress on your back. Take over-the-counter and prescription medicines and apply heat or ice as directed by your health care provider. This information is not intended to replace advice given to you by your health care provider. Make sure you discuss any questions you have with your healthcare provider. Document Revised: 02/19/2020 Document Reviewed: 02/22/2020 Elsevier Patient Education  2022 [employer]  

## 2021-01-29 NOTE — Progress Notes (Signed)
LMP 01/07/2021    Subjective:    Patient ID: Megan Bentley, female    DOB: [birth year], 35 y.o.   MRN: [id number]  HPI: Megan Bentley is a 35 y.o. female  Chief Complaint  Patient presents with   Follow-up    Patient is here for follow up visit and states that she has been discussing different things with provider via MyChart.    This visit was completed via video visit through MyChart due to the restrictions of the COVID-19 pandemic. All issues as above were discussed and addressed. Physical exam was done as above through visual confirmation on video through MyChart. If it was felt that the patient should be evaluated in the office, they were directed there. The patient verbally consented to this visit. Location of the patient: home Location of the provider: work Those involved with this call:  Provider: Aura Dials, DNP CMA: Malen Gauze, CMA Front Desk/Registration: Kandice Hams  Time spent on call:  21 minutes with patient face to face via video conference. More than 50% of this time was spent in counseling and coordination of care. 15 minutes total spent in review of patient's record and preparation of their chart. I verified patient identity using two factors (patient name and date of birth). Patient consents verbally to being seen via telemedicine visit today.     BACK PAIN: Continues to have back pain post injury 01/06/21, followed by ortho with next visit in September.  Had has multiple imaging done.  Taking Norco at this time for pain, instructed to take for severe pain only but reports this does not work and would prefer Tramadol which she was told only PCP could give her until she sees ortho again.  Have had at length discussions with patient and she is aware PCP does not perform chronic pain management.  She stopped taking Norco because she thinks it was causing rebound headaches. She would like to return to Tramadol which works better.  Last Norco fill 01/26/21.  Saw  urology for kidney stones on 01/22/21, monitoring only at this time. Is seeing cardiology today at 1 PM.  Saw hematology yesterday, platelet count has returned to normal she reports, suspect rebound effect with infection recently -- she is return in 2 weeks for anemia.  MIGRAINES Followed by neuro and last seen yesterday with increase in Klonopin to 0.5 MG in morning and night and 0.25 MG in afternoon -- she is not to increased until off pain medication.  Continued Fiorocet for migraines and started Nortriptyline 10 MG at night to increase to 20 MG in one week and then 30 MG.   Duration: chronic Onset: gradual Severity: moderate Quality: dull, aching, tender, tearing, and throbbing Frequency: intermittent Location: frontal behind eyes Headache duration: 24 hours Radiation: no Time of day headache occurs: varies Alleviating factors: Fioricet Aggravating factors: unknown Headache status at time of visit: asymptomatic Treatments attempted: Treatments attempted:  Fioricet    Aura: no Nausea:  yes Vomiting: no Photophobia:  yes Phonophobia:  yes Effect on social functioning:  no Numbers of missed days of school/work each month: none Confusion:  no Gait disturbance/ataxia:  no Behavioral changes:  no Fevers:  no   Relevant past medical, surgical, family and social history reviewed and updated as indicated. Interim medical history since our last visit reviewed. Allergies and medications reviewed and updated.  Review of Systems  Constitutional:  Positive for fatigue and unexpected weight change. Negative for activity change, appetite change, chills and fever.  Respiratory:  Negative for cough, chest tightness, shortness of breath and wheezing.   Cardiovascular:  Negative for chest pain, palpitations and leg swelling.  Gastrointestinal:  Positive for nausea. Negative for abdominal distention, abdominal pain, constipation, diarrhea, rectal pain and vomiting.  Musculoskeletal:  Positive for  back pain.  Neurological:  Positive for headaches. Negative for dizziness, seizures, light-headedness and numbness.  Psychiatric/Behavioral: Negative.     Per HPI unless specifically indicated above     Objective:    LMP 01/07/2021   Wt Readings from Last 3 Encounters:  01/28/21 91 lb 11.4 oz (41.6 kg)  01/22/21 89 lb 12.8 oz (40.7 kg)  01/20/21 88 lb 3.2 oz (40 kg)    Physical Exam Vitals and nursing note reviewed.  Constitutional:      General: She is awake. She is not in acute distress.    Appearance: She is well-developed. She is not ill-appearing.  HENT:     Head: Normocephalic.     Right Ear: Hearing normal.     Left Ear: Hearing normal.  Eyes:     General: Lids are normal.        Right eye: No discharge.        Left eye: No discharge.     Conjunctiva/sclera: Conjunctivae normal.  Pulmonary:     Effort: Pulmonary effort is normal. No accessory muscle usage or respiratory distress.  Musculoskeletal:     Cervical back: Normal range of motion.  Neurological:     Mental Status: She is alert and oriented to person, place, and time.  Psychiatric:        Attention and Perception: Attention normal.        Mood and Affect: Mood normal.        Behavior: Behavior normal. Behavior is cooperative.        Thought Content: Thought content normal.        Judgment: Judgment normal.   Results for orders placed or performed in visit on 01/28/21  Sedimentation rate  Result Value Ref Range   Sed Rate 32 (H) 0 - 20 mm/hr  Lactate dehydrogenase  Result Value Ref Range   LDH 94 (L) 98 - 192 U/L  TSH  Result Value Ref Range   TSH 2.713 0.350 - 4.500 uIU/mL  Haptoglobin  Result Value Ref Range   Haptoglobin 182 33 - 278 mg/dL  Folate  Result Value Ref Range   Folate 21.4 >5.9 ng/mL  Vitamin B12  Result Value Ref Range   Vitamin B-12 571 180 - 914 pg/mL  Ferritin  Result Value Ref Range   Ferritin 62 11 - 307 ng/mL  CBC with Differential/Platelet  Result Value Ref Range    WBC 5.4 4.0 - 10.5 K/uL   RBC 3.47 (L) 3.87 - 5.11 MIL/uL   Hemoglobin 11.2 (L) 12.0 - 15.0 g/dL   HCT 25.0 (L) 53.9 - 76.7 %   MCV 93.9 80.0 - 100.0 fL   MCH 32.3 26.0 - 34.0 pg   MCHC 34.4 30.0 - 36.0 g/dL   RDW 34.1 93.7 - 90.2 %   Platelets 402 (H) 150 - 400 K/uL   nRBC 0.0 0.0 - 0.2 %   Neutrophils Relative % 48 %   Neutro Abs 2.6 1.7 - 7.7 K/uL   Lymphocytes Relative 40 %   Lymphs Abs 2.2 0.7 - 4.0 K/uL   Monocytes Relative 7 %   Monocytes Absolute 0.4 0.1 - 1.0 K/uL   Eosinophils Relative 3 %   Eosinophils Absolute  0.1 0.0 - 0.5 K/uL   Basophils Relative 1 %   Basophils Absolute 0.0 0.0 - 0.1 K/uL   Immature Granulocytes 1 %   Abs Immature Granulocytes 0.04 0.00 - 0.07 K/uL  Comprehensive metabolic panel  Result Value Ref Range   Sodium 135 135 - 145 mmol/L   Potassium 4.0 3.5 - 5.1 mmol/L   Chloride 100 98 - 111 mmol/L   CO2 26 22 - 32 mmol/L   Glucose, Bld 91 70 - 99 mg/dL   BUN 25 (H) 6 - 20 mg/dL   Creatinine, Ser 1.610.59 0.44 - 1.00 mg/dL   Calcium 9.2 8.9 - 09.610.3 mg/dL   Total Protein 7.7 6.5 - 8.1 g/dL   Albumin 4.1 3.5 - 5.0 g/dL   AST 17 15 - 41 U/L   ALT 27 0 - 44 U/L   Alkaline Phosphatase 61 38 - 126 U/L   Total Bilirubin 0.4 0.3 - 1.2 mg/dL   GFR, Estimated >04>60 >54>60 mL/min   Anion gap 9 5 - 15  Iron and TIBC  Result Value Ref Range   Iron 95 28 - 170 ug/dL   TIBC 098416 119250 - 147450 ug/dL   Saturation Ratios 23 10.4 - 31.8 %   UIBC 321 ug/dL  Reticulocytes  Result Value Ref Range   Retic Ct Pct 4.0 (H) 0.4 - 3.1 %   RBC. 3.52 (L) 3.87 - 5.11 MIL/uL   Retic Count, Absolute 140.8 19.0 - 186.0 K/uL   Immature Retic Fract 13.0 2.3 - 15.9 %      Assessment & Plan:   Problem List Items Addressed This Visit       Cardiovascular and Mediastinum   Migraine with aura and without status migrainosus, not intractable - Primary    Chronic with improved symptoms on current medication regimen.  Continue collaboration with neurology and current regimen as  prescribed by them.        Relevant Medications   Ubrogepant 50 MG TABS   traMADol (ULTRAM) 50 MG tablet     Other   Back pain    Ongoing, continue collaboration with ortho who she is to see again in September.  Have recommended she try to schedule visit sooner with them due to ongoing pain.  Is aware PCP does not prescribed chronic pain management, she requested a month supply medication to last until see them.  At this time will STOP Norco and started Tramadol 25 MG Q6H PRN, 5 day supply only sent.  Again reiterated to her to use this for SEVERE pain only, to use OTC Ibuprofen or Tylenol for mild to moderate pain + not to take pain medication at same time she is taking her benzo prescribed by neurology.  She agrees with this plan and reports understanding.  If ongoing pain will get her into PT.  Return in 3 weeks.      Relevant Medications   traMADol (ULTRAM) 50 MG tablet   Anxiety and depression    Chronic, ongoing, followed by neurology with recent increase in Klonopin, which she is not to increase until no longer taking pain medication for back.  She is aware of long term risks of chronic benzo use and aware this script needs to come from neurology, as they initiated this treatment.  Continue current regimen as prescribed by them.         I discussed the assessment and treatment plan with the patient. The patient was provided an opportunity to ask questions and all  were answered. The patient agreed with the plan and demonstrated an understanding of the instructions.   The patient was advised to call back or seek an in-person evaluation if the symptoms worsen or if the condition fails to improve as anticipated.   I provided 21+ minutes of time during this encounter.   Follow up plan: Return in about 4 weeks (around 02/26/2021) for BACK PAIN AND MIGRAINES + MOOD.

## 2021-01-29 NOTE — Assessment & Plan Note (Signed)
Ongoing, continue collaboration with ortho who she is to see again in September.  Have recommended she try to schedule visit sooner with them due to ongoing pain.  Is aware PCP does not prescribed chronic pain management, she requested a month supply medication to last until see them.  At this time will STOP Norco and started Tramadol 25 MG Q6H PRN, 5 day supply only sent.  Again reiterated to her to use this for SEVERE pain only, to use OTC Ibuprofen or Tylenol for mild to moderate pain + not to take pain medication at same time she is taking her benzo prescribed by neurology.  She agrees with this plan and reports understanding.  If ongoing pain will get her into PT.  Return in 3 weeks.

## 2021-01-30 NOTE — Addendum Note (Signed)
Addended by: Aura Dials T on: 01/30/2021 07:29 PM   Modules accepted: Orders

## 2021-02-01 ENCOUNTER — Encounter: Payer: Self-pay | Admitting: Oncology

## 2021-02-01 LAB — MULTIPLE MYELOMA PANEL, SERUM
Albumin SerPl Elph-Mcnc: 3.8 g/dL (ref 2.9–4.4)
Albumin/Glob SerPl: 1.2 (ref 0.7–1.7)
Alpha 1: 0.3 g/dL (ref 0.0–0.4)
Alpha2 Glob SerPl Elph-Mcnc: 0.8 g/dL (ref 0.4–1.0)
B-Globulin SerPl Elph-Mcnc: 0.9 g/dL (ref 0.7–1.3)
Gamma Glob SerPl Elph-Mcnc: 1.2 g/dL (ref 0.4–1.8)
Globulin, Total: 3.2 g/dL (ref 2.2–3.9)
IgA: 140 mg/dL (ref 87–352)
IgG (Immunoglobin G), Serum: 1060 mg/dL (ref 586–1602)
IgM (Immunoglobulin M), Srm: 209 mg/dL (ref 26–217)
Total Protein ELP: 7 g/dL (ref 6.0–8.5)
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
## 2021-02-02 ENCOUNTER — Ambulatory Visit: Payer: Medicaid Other | Admitting: Nurse Practitioner

## 2021-02-02 LAB — BCR-ABL1 FISH
Cells Analyzed: 200
Cells Counted: 200

## 2021-02-04 DIAGNOSIS — F329 Major depressive disorder, single episode, unspecified: Secondary | ICD-10-CM | POA: Diagnosis not present

## 2021-02-04 LAB — MPL MUTATION ANALYSIS

## 2021-02-05 DIAGNOSIS — F329 Major depressive disorder, single episode, unspecified: Secondary | ICD-10-CM | POA: Diagnosis not present

## 2021-02-05 LAB — JAK2 GENOTYPR

## 2021-02-06 LAB — CALRETICULIN (CALR) MUTATION ANALYSIS

## 2021-02-09 ENCOUNTER — Other Ambulatory Visit: Payer: Self-pay

## 2021-02-09 ENCOUNTER — Encounter: Payer: Self-pay | Admitting: Nurse Practitioner

## 2021-02-09 ENCOUNTER — Ambulatory Visit (INDEPENDENT_AMBULATORY_CARE_PROVIDER_SITE_OTHER): Payer: Medicaid Other | Admitting: Nurse Practitioner

## 2021-02-09 VITALS — BP 106/74 | HR 96 | Temp 99.4°F | Ht 63.6 in | Wt 90.8 lb

## 2021-02-09 DIAGNOSIS — R55 Syncope and collapse: Secondary | ICD-10-CM | POA: Diagnosis not present

## 2021-02-09 DIAGNOSIS — M545 Low back pain, unspecified: Secondary | ICD-10-CM | POA: Diagnosis not present

## 2021-02-09 DIAGNOSIS — R8281 Pyuria: Secondary | ICD-10-CM | POA: Diagnosis not present

## 2021-02-09 DIAGNOSIS — E876 Hypokalemia: Secondary | ICD-10-CM | POA: Diagnosis not present

## 2021-02-09 DIAGNOSIS — N200 Calculus of kidney: Secondary | ICD-10-CM

## 2021-02-09 DIAGNOSIS — F329 Major depressive disorder, single episode, unspecified: Secondary | ICD-10-CM | POA: Diagnosis not present

## 2021-02-09 DIAGNOSIS — G43109 Migraine with aura, not intractable, without status migrainosus: Secondary | ICD-10-CM | POA: Diagnosis not present

## 2021-02-09 LAB — URINALYSIS, ROUTINE W REFLEX MICROSCOPIC
Bilirubin, UA: NEGATIVE
Glucose, UA: NEGATIVE
Ketones, UA: NEGATIVE
Leukocytes,UA: NEGATIVE
Nitrite, UA: NEGATIVE
Specific Gravity, UA: 1.02 (ref 1.005–1.030)
Urobilinogen, Ur: 0.2 mg/dL (ref 0.2–1.0)
pH, UA: 7 (ref 5.0–7.5)

## 2021-02-09 LAB — MICROSCOPIC EXAMINATION
Bacteria, UA: NONE SEEN
Epithelial Cells (non renal): NONE SEEN /hpf (ref 0–10)
WBC, UA: NONE SEEN /hpf (ref 0–5)

## 2021-02-09 MED ORDER — TRAMADOL HCL 50 MG PO TABS: 25.0000 mg | ORAL_TABLET | Freq: Four times a day (QID) | ORAL | 0 refills | Status: AC | PRN

## 2021-02-09 NOTE — Progress Notes (Signed)
BP 106/74   Pulse 96   Temp 99.4 F (37.4 C) (Oral)   Ht 5' 3.6" (1.615 m)   Wt 90 lb 12.8 oz (41.2 kg)   SpO2 98%   BMI 15.78 kg/m    Subjective:    Patient ID: Megan Bentley, female    DOB: 27-Nov-1985, 35 y.o.   MRN: [id number]  HPI: Megan Bentley is a 35 y.o. female  Chief Complaint  Patient presents with   Back Pain   Migraine   BACK PAIN: Ongoing issue.  Continues to have back pain post injury 01/06/21, followed by ortho with next visit, September 9th to further assess back pain.  Had has multiple imaging done.  Taking Tramadol and Gabapentin for pain at this time, as reported Norco did not work.  Have had at length discussions with patient and she is aware PCP does not perform chronic pain management.  She stopped taking Norco because she thinks it was causing rebound headaches. She would like to return to Tramadol which works better.  Last Tramadol fill on 01/29/21 for 5 days supply.  She is to take medication for severe pain only, has discussed with her at length, as she is also prescribed Klonopin by neurology for her anxiety.   Saw urology for kidney stones on 01/22/21, monitoring only at this time -- she is on abx treatment for 90 days and is to return in 3 months, would like her urine checked today. Is seeing cardiology today at 1 PM.  Saw hematology yesterday, platelet count has returned to normal she reports, suspect rebound effect with infection recently -- she is return in 2 weeks for anemia.   MIGRAINES Followed by neuro and last seen 01/28/21 with increase in Klonopin to 0.5 MG in morning and night and 0.25 MG in afternoon -- she is not to increase until off pain medication.  Continued Fiorocet for migraines and started Nortriptyline 10 MG at night to increase to 20 MG in one week and then 30 MG.  They are working on getting coverage for Ubrogepant. Duration: chronic Onset: gradual Severity: moderate Quality: dull, aching, tender, tearing, and throbbing Frequency:  intermittent Location: frontal behind eyes Headache duration: 24 hours Radiation: no Time of day headache occurs: varies Alleviating factors: Fioricet Aggravating factors: unknown Headache status at time of visit: asymptomatic Treatments attempted: Treatments attempted:  Fioricet    Aura: no Nausea:  yes Vomiting: no Photophobia:  yes Phonophobia:  yes Effect on social functioning:  no Numbers of missed days of school/work each month: none Confusion:  no Gait disturbance/ataxia:  no Behavioral changes:  no Fevers:  no   Relevant past medical, surgical, family and social history reviewed and updated as indicated. Interim medical history since our last visit reviewed. Allergies and medications reviewed and updated.  Review of Systems  Constitutional:  Negative for activity change, appetite change, chills and fever.  Respiratory:  Negative for cough, chest tightness, shortness of breath and wheezing.   Cardiovascular:  Negative for chest pain, palpitations and leg swelling.  Gastrointestinal:  Negative for abdominal distention, abdominal pain, constipation, diarrhea, rectal pain and vomiting.  Musculoskeletal:  Positive for back pain.  Neurological:  Positive for headaches. Negative for dizziness, seizures, light-headedness and numbness.  Psychiatric/Behavioral: Negative.     Per HPI unless specifically indicated above     Objective:    BP 106/74   Pulse 96   Temp 99.4 F (37.4 C) (Oral)   Ht 5' 3.6" (1.615 m)  Wt 90 lb 12.8 oz (41.2 kg)   SpO2 98%   BMI 15.78 kg/m   Wt Readings from Last 3 Encounters:  02/09/21 90 lb 12.8 oz (41.2 kg)  01/28/21 91 lb 11.4 oz (41.6 kg)  01/22/21 89 lb 12.8 oz (40.7 kg)    Physical Exam Vitals and nursing note reviewed.  Constitutional:      General: She is awake. She is not in acute distress.    Appearance: She is well-developed and well-groomed. She is not ill-appearing or toxic-appearing.  HENT:     Head: Normocephalic.      Right Ear: Hearing, tympanic membrane, ear canal and external ear normal.     Left Ear: Hearing, tympanic membrane, ear canal and external ear normal.  Eyes:     General: Lids are normal.        Right eye: No discharge.        Left eye: No discharge.     Extraocular Movements: Extraocular movements intact.     Conjunctiva/sclera: Conjunctivae normal.     Pupils: Pupils are equal, round, and reactive to light.     Visual Fields: Right eye visual fields normal and left eye visual fields normal.  Neck:     Thyroid: No thyromegaly.     Vascular: No carotid bruit or JVD.  Cardiovascular:     Rate and Rhythm: Normal rate and regular rhythm.     Heart sounds: Normal heart sounds. No murmur heard.   No gallop.  Pulmonary:     Effort: Pulmonary effort is normal.     Breath sounds: Normal breath sounds.  Abdominal:     General: Bowel sounds are normal.     Palpations: Abdomen is soft. There is no hepatomegaly or splenomegaly.  Musculoskeletal:     Cervical back: Normal range of motion and neck supple.     Right lower leg: No edema.     Left lower leg: No edema.  Lymphadenopathy:     Cervical: No cervical adenopathy.  Skin:    General: Skin is warm and dry.  Neurological:     Mental Status: She is alert and oriented to person, place, and time.     Cranial Nerves: Cranial nerves are intact.     Sensory: Sensation is intact.     Motor: Motor function is intact.     Coordination: Coordination is intact.     Gait: Gait is intact.     Deep Tendon Reflexes: Reflexes are normal and symmetric.     Reflex Scores:      Brachioradialis reflexes are 2+ on the right side and 2+ on the left side.      Patellar reflexes are 2+ on the right side and 2+ on the left side. Psychiatric:        Attention and Perception: Attention normal.        Mood and Affect: Mood normal.        Speech: Speech normal.        Behavior: Behavior normal. Behavior is cooperative.        Thought Content: Thought content  normal.    Results for orders placed or performed in visit on 01/28/21  BCR-ABL1 FISH  Result Value Ref Range   Specimen Type BLOOD    Cells Counted 200    Cells Analyzed 200    FISH Result Comment:    Interpretation Comment:    Director Review: Comment:   Sedimentation rate  Result Value Ref Range   Sed  Rate 32 (H) 0 - 20 mm/hr  ANA Comprehensive Panel  Result Value Ref Range   ds DNA Ab <1 0 - 9 IU/mL   Ribonucleic Protein 0.4 0.0 - 0.9 AI   ENA SM Ab Ser-aCnc <0.2 0.0 - 0.9 AI   Scleroderma (Scl-70) (ENA) Antibody, IgG <0.2 0.0 - 0.9 AI   SSA (Ro) (ENA) Antibody, IgG <0.2 0.0 - 0.9 AI   SSB (La) (ENA) Antibody, IgG <0.2 0.0 - 0.9 AI   Chromatin Ab SerPl-aCnc <0.2 0.0 - 0.9 AI   Anti JO-1 <0.2 0.0 - 0.9 AI   Centromere Ab Screen <0.2 0.0 - 0.9 AI   See below: Comment   MPL mutation analysis  Result Value Ref Range   MPL Mutation Analysis Result: Comment    BACKGROUND: Comment    METHODOLOGY: Comment    REFERENCES: Comment    DIRECTOR REVIEW: Comment   Calreticulin (CALR) Mutation Analysis  Result Value Ref Range   CALR Mutation Detection Result Comment    Background: Comment    Methodology: Comment    References: Comment    Director Review Comment   JAK2 genotypr  Result Value Ref Range   JAK2 GenotypR Comment    Director Review, JAK2 Comment    BACKGROUND: Comment   Lactate dehydrogenase  Result Value Ref Range   LDH 94 (L) 98 - 192 U/L  TSH  Result Value Ref Range   TSH 2.713 0.350 - 4.500 uIU/mL  Haptoglobin  Result Value Ref Range   Haptoglobin 182 33 - 278 mg/dL  Folate  Result Value Ref Range   Folate 21.4 >5.9 ng/mL  Vitamin B12  Result Value Ref Range   Vitamin B-12 571 180 - 914 pg/mL  Ferritin  Result Value Ref Range   Ferritin 62 11 - 307 ng/mL  CBC with Differential/Platelet  Result Value Ref Range   WBC 5.4 4.0 - 10.5 K/uL   RBC 3.47 (L) 3.87 - 5.11 MIL/uL   Hemoglobin 11.2 (L) 12.0 - 15.0 g/dL   HCT 32.6 (L) 36.0 - 46.0 %   MCV  93.9 80.0 - 100.0 fL   MCH 32.3 26.0 - 34.0 pg   MCHC 34.4 30.0 - 36.0 g/dL   RDW 14.6 11.5 - 15.5 %   Platelets 402 (H) 150 - 400 K/uL   nRBC 0.0 0.0 - 0.2 %   Neutrophils Relative % 48 %   Neutro Abs 2.6 1.7 - 7.7 K/uL   Lymphocytes Relative 40 %   Lymphs Abs 2.2 0.7 - 4.0 K/uL   Monocytes Relative 7 %   Monocytes Absolute 0.4 0.1 - 1.0 K/uL   Eosinophils Relative 3 %   Eosinophils Absolute 0.1 0.0 - 0.5 K/uL   Basophils Relative 1 %   Basophils Absolute 0.0 0.0 - 0.1 K/uL   Immature Granulocytes 1 %   Abs Immature Granulocytes 0.04 0.00 - 0.07 K/uL  Comprehensive metabolic panel  Result Value Ref Range   Sodium 135 135 - 145 mmol/L   Potassium 4.0 3.5 - 5.1 mmol/L   Chloride 100 98 - 111 mmol/L   CO2 26 22 - 32 mmol/L   Glucose, Bld 91 70 - 99 mg/dL   BUN 25 (H) 6 - 20 mg/dL   Creatinine, Ser 0.59 0.44 - 1.00 mg/dL   Calcium 9.2 8.9 - 10.3 mg/dL   Total Protein 7.7 6.5 - 8.1 g/dL   Albumin 4.1 3.5 - 5.0 g/dL   AST 17 15 - 41 U/L  ALT 27 0 - 44 U/L   Alkaline Phosphatase 61 38 - 126 U/L   Total Bilirubin 0.4 0.3 - 1.2 mg/dL   GFR, Estimated >60 >60 mL/min   Anion gap 9 5 - 15  Iron and TIBC  Result Value Ref Range   Iron 95 28 - 170 ug/dL   TIBC 416 250 - 450 ug/dL   Saturation Ratios 23 10.4 - 31.8 %   UIBC 321 ug/dL  Reticulocytes  Result Value Ref Range   Retic Ct Pct 4.0 (H) 0.4 - 3.1 %   RBC. 3.52 (L) 3.87 - 5.11 MIL/uL   Retic Count, Absolute 140.8 19.0 - 186.0 K/uL   Immature Retic Fract 13.0 2.3 - 15.9 %  Multiple Myeloma Panel (SPEP&IFE w/QIG)  Result Value Ref Range   IgG (Immunoglobin G), Serum 1,060 586 - 1,602 mg/dL   IgA 140 87 - 352 mg/dL   IgM (Immunoglobulin M), Srm 209 26 - 217 mg/dL   Total Protein ELP 7.0 6.0 - 8.5 g/dL   Albumin SerPl Elph-Mcnc 3.8 2.9 - 4.4 g/dL   Alpha 1 0.3 0.0 - 0.4 g/dL   Alpha2 Glob SerPl Elph-Mcnc 0.8 0.4 - 1.0 g/dL   B-Globulin SerPl Elph-Mcnc 0.9 0.7 - 1.3 g/dL   Gamma Glob SerPl Elph-Mcnc 1.2 0.4 - 1.8 g/dL    M Protein SerPl Elph-Mcnc Not Observed Not Observed g/dL   Globulin, Total 3.2 2.2 - 3.9 g/dL   Albumin/Glob SerPl 1.2 0.7 - 1.7   IFE 1 Comment    Please Note Comment       Assessment & Plan:   Problem List Items Addressed This Visit       Cardiovascular and Mediastinum   Migraine with aura and without status migrainosus, not intractable    Chronic with improved symptoms on current medication regimen.  Continue collaboration with neurology and current regimen as prescribed by them.  Will refill Fioricet, which has offered her benefit when needed.  Recommend minimal use of this.        Relevant Medications   traMADol (ULTRAM) 50 MG tablet   Other Relevant Orders   CBC with Differential/Platelet   Comprehensive metabolic panel   TSH     Genitourinary   Recurrent nephrolithiasis    Currently with 3 non obstructing to right kidney.  She has history of stones to left side with stent placement.  Continue collaboration with urology and abx regimen.  Check UA today, showing 3+ blood still, and send for culture.      Relevant Medications   traMADol (ULTRAM) 50 MG tablet     Other   Back pain - Primary    Acute and ongoing, continue collaboration with ortho.  Will send 5 day supply refill on Tramadol, she is to use sparsely only for severe pain.  Supply should last until ortho visit and not to take at same time as her Klonopin.  Return for follow-up after ortho visit.      Relevant Medications   traMADol (ULTRAM) 50 MG tablet   Other Relevant Orders   Urinalysis, Routine w reflex microscopic   Hypokalemia    Recheck CMP today.      Other Visit Diagnoses     Pyuria       Urine for culture.   Relevant Orders   Urine Culture        Follow up plan: Return in about 16 days (around 02/25/2021) for Back Pain.

## 2021-02-09 NOTE — Patient Instructions (Signed)
Acute Back Pain, Adult Acute back pain is sudden and usually short-lived. It is often caused by an injury to the muscles and tissues in the back. The injury may result from: A muscle or ligament getting overstretched or torn (strained). Ligaments are tissues that connect bones to each other. Lifting something improperly can cause a back strain. Wear and tear (degeneration) of the spinal disks. Spinal disks are circular tissue that provide cushioning between the bones of the spine (vertebrae). Twisting motions, such as while playing sports or doing yard work. A hit to the back. Arthritis. You may have a physical exam, lab tests, and imaging tests to find the cause ofyour pain. Acute back pain usually goes away with rest and home care. Follow these instructions at home: Managing pain, stiffness, and swelling Treatment may include medicines for pain and inflammation that are taken by mouth or applied to the skin, prescription pain medicine, or muscle relaxants. Take over-the-counter and prescription medicines only as told by your health care provider. Your health care provider may recommend applying ice during the first 24-48 hours after your pain starts. To do this: Put ice in a plastic bag. Place a towel between your skin and the bag. Leave the ice on for 20 minutes, 2-3 times a day. If directed, apply heat to the affected area as often as told by your health care provider. Use the heat source that your health care provider recommends, such as a moist heat pack or a heating pad. Place a towel between your skin and the heat source. Leave the heat on for 20-30 minutes. Remove the heat if your skin turns bright red. This is especially important if you are unable to feel pain, heat, or cold. You have a greater risk of getting burned. Activity  Do not stay in bed. Staying in bed for more than 1-2 days can delay your recovery. Sit up and stand up straight. Avoid leaning forward when you sit or  hunching over when you stand. If you work at a desk, sit close to it so you do not need to lean over. Keep your chin tucked in. Keep your neck drawn back, and keep your elbows bent at a 90-degree angle (right angle). Sit high and close to the steering wheel when you drive. Add lower back (lumbar) support to your car seat, if needed. Take short walks on even surfaces as soon as you are able. Try to increase the length of time you walk each day. Do not sit, drive, or stand in one place for more than 30 minutes at a time. Sitting or standing for long periods of time can put stress on your back. Do not drive or use heavy machinery while taking prescription pain medicine. Use proper lifting techniques. When you bend and lift, use positions that put less stress on your back: Bend your knees. Keep the load close to your body. Avoid twisting. Exercise regularly as told by your health care provider. Exercising helps your back heal faster and helps prevent back injuries by keeping muscles strong and flexible. Work with a physical therapist to make a safe exercise program, as recommended by your health care provider. Do any exercises as told by your physical therapist.  Lifestyle Maintain a healthy weight. Extra weight puts stress on your back and makes it difficult to have good posture. Avoid activities or situations that make you feel anxious or stressed. Stress and anxiety increase muscle tension and can make back pain worse. Learn ways to manage   anxiety and stress, such as through exercise. General instructions Sleep on a firm mattress in a comfortable position. Try lying on your side with your knees slightly bent. If you lie on your back, put a pillow under your knees. Follow your treatment plan as told by your health care provider. This may include: Cognitive or behavioral therapy. Acupuncture or massage therapy. Meditation or yoga. Contact a health care provider if: You have pain that is not  relieved with rest or medicine. You have increasing pain going down into your legs or buttocks. Your pain does not improve after 2 weeks. You have pain at night. You lose weight without trying. You have a fever or chills. Get help right away if: You develop new bowel or bladder control problems. You have unusual weakness or numbness in your arms or legs. You develop nausea or vomiting. You develop abdominal pain. You feel faint. Summary Acute back pain is sudden and usually short-lived. Use proper lifting techniques. When you bend and lift, use positions that put less stress on your back. Take over-the-counter and prescription medicines and apply heat or ice as directed by your health care provider. This information is not intended to replace advice given to you by your health care provider. Make sure you discuss any questions you have with your healthcare provider. Document Revised: 02/19/2020 Document Reviewed: 02/22/2020 Elsevier Patient Education  2022 [employer]  

## 2021-02-09 NOTE — Assessment & Plan Note (Signed)
Chronic with improved symptoms on current medication regimen.  Continue collaboration with neurology and current regimen as prescribed by them.  Will refill Fioricet, which has offered her benefit when needed.  Recommend minimal use of this.   

## 2021-02-09 NOTE — Assessment & Plan Note (Signed)
Acute and ongoing, continue collaboration with ortho.  Will send 5 day supply refill on Tramadol, she is to use sparsely only for severe pain.  Supply should last until ortho visit and not to take at same time as her Klonopin.  Return for follow-up after ortho visit.

## 2021-02-09 NOTE — Assessment & Plan Note (Signed)
Currently with 3 non obstructing to right kidney.  She has history of stones to left side with stent placement.  Continue collaboration with urology and abx regimen.  Check UA today, showing 3+ blood still, and send for culture.

## 2021-02-09 NOTE — Assessment & Plan Note (Signed)
Recheck CMP today.  °

## 2021-02-10 DIAGNOSIS — F329 Major depressive disorder, single episode, unspecified: Secondary | ICD-10-CM | POA: Diagnosis not present

## 2021-02-10 LAB — COMPREHENSIVE METABOLIC PANEL
ALT: 12 IU/L (ref 0–32)
AST: 14 IU/L (ref 0–40)
Albumin/Globulin Ratio: 2 (ref 1.2–2.2)
Albumin: 4.9 g/dL — ABNORMAL HIGH (ref 3.8–4.8)
Alkaline Phosphatase: 67 IU/L (ref 44–121)
BUN/Creatinine Ratio: 23 (ref 9–23)
BUN: 17 mg/dL (ref 6–20)
Bilirubin Total: 0.7 mg/dL (ref 0.0–1.2)
CO2: 25 mmol/L (ref 20–29)
Calcium: 8.8 mg/dL (ref 8.7–10.2)
Chloride: 100 mmol/L (ref 96–106)
Creatinine, Ser: 0.73 mg/dL (ref 0.57–1.00)
Globulin, Total: 2.4 g/dL (ref 1.5–4.5)
Glucose: 64 mg/dL — ABNORMAL LOW (ref 65–99)
Potassium: 4.1 mmol/L (ref 3.5–5.2)
Sodium: 140 mmol/L (ref 134–144)
Total Protein: 7.3 g/dL (ref 6.0–8.5)
eGFR: 112 mL/min/{1.73_m2} (ref >59)

## 2021-02-10 LAB — CBC WITH DIFFERENTIAL/PLATELET
Basophils Absolute: 0 10*3/uL (ref 0.0–0.2)
Basos: 1 %
EOS (ABSOLUTE): 0.2 10*3/uL (ref 0.0–0.4)
Eos: 2 %
Hematocrit: 39 % (ref 34.0–46.6)
Hemoglobin: 13 g/dL (ref 11.1–15.9)
Immature Grans (Abs): 0 10*3/uL (ref 0.0–0.1)
Immature Granulocytes: 0 %
Lymphocytes Absolute: 1.6 10*3/uL (ref 0.7–3.1)
Lymphs: 18 %
MCH: 33.2 pg — ABNORMAL HIGH (ref 26.6–33.0)
MCHC: 33.3 g/dL (ref 31.5–35.7)
MCV: 100 fL — ABNORMAL HIGH (ref 79–97)
Monocytes Absolute: 0.6 10*3/uL (ref 0.1–0.9)
Monocytes: 6 %
Neutrophils Absolute: 6.4 10*3/uL (ref 1.4–7.0)
Neutrophils: 73 %
Platelets: 256 10*3/uL (ref 150–450)
RBC: 3.91 x10E6/uL (ref 3.77–5.28)
RDW: 14.5 % (ref 11.7–15.4)
WBC: 8.8 10*3/uL (ref 3.4–10.8)

## 2021-02-10 LAB — TSH: TSH: 1.44 u[IU]/mL (ref 0.450–4.500)

## 2021-02-10 NOTE — Progress Notes (Signed)
Contacted via [place] [facility] [place], your labs have returned.  Overall anemia has improved.  Great news.  Kidney function, creatinine and eGFR, is normal, as is liver function, AST an ALT.  Thyroid level is normal.  Sugar level, glucose, is low.  I want you to ensure you are getting frequent health snacks in during the day to help keep these levels stable and prevent you from feeling bad.  Any questions? Keep being stellar!!  Thank you for allowing me to participate in your care.  I appreciate you. Kindest regards, Shaine Mount

## 2021-02-11 ENCOUNTER — Other Ambulatory Visit: Payer: Self-pay

## 2021-02-11 ENCOUNTER — Ambulatory Visit: Payer: Medicaid Other | Admitting: Nurse Practitioner

## 2021-02-11 ENCOUNTER — Inpatient Hospital Stay (HOSPITAL_BASED_OUTPATIENT_CLINIC_OR_DEPARTMENT_OTHER): Payer: Medicaid Other | Admitting: Oncology

## 2021-02-11 DIAGNOSIS — D75839 Thrombocytosis, unspecified: Secondary | ICD-10-CM

## 2021-02-11 LAB — URINE CULTURE: Organism ID, Bacteria: NO GROWTH

## 2021-02-11 NOTE — Progress Notes (Signed)
Contacted via MyChart   Urine shows no growth!!  Great news!!  Antibiotics are working.

## 2021-02-15 ENCOUNTER — Encounter: Payer: Self-pay | Admitting: Oncology

## 2021-02-15 DIAGNOSIS — F329 Major depressive disorder, single episode, unspecified: Secondary | ICD-10-CM | POA: Diagnosis not present

## 2021-02-15 NOTE — Progress Notes (Signed)
I connected with Megan Bentley on 02/15/21 at  2:15 PM EDT by video enabled telemedicine visit and verified that I am speaking with the correct person using two identifiers.   I discussed the limitations, risks, security and privacy concerns of performing an evaluation and management service by telemedicine and the availability of in-person appointments. I also discussed with the patient that there may be a patient responsible charge related to this service. The patient expressed understanding and agreed to proceed.  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  Her past medical history is significant for back pain following an injury on 01/06/2021 for which she is on as needed Norco.  She has ongoing history of migraines for which she sees neurology.  She reports ongoing fatigue and has had unintentional weight loss she weighed about 122 pounds back in October 2021 and is now presently down to 91 pounds.  She had a CT angio chest to rule out PE in December 2021 which did not show any acute intrathoracic abnormality.  Also had a CT abdomen and pelvis without contrast for abdominal pain and weight loss which did not show any findings other than constipation.   In terms of thrombocytosis patient had a normal platelet count up until August 2021.  She was noted to have a platelet count of 485 in December 2021 which again normalized to 203 in April 2022.  More recently in August 2022 she was noted to have a platelet count of 485 then 591 and then increased to 827 and hence referred to me.  During this period in August patient was hospitalized for urinary tract infection and recurrent UTI when her platelet counts were checked.   Results of blood work from 01/28/2021  were as follows: CBC showed white cell count of 5.4, H&H of 11.2/22.6 with an MCV of 93.9 and a platelet count of 402.  Ferritin levels were normal at 62 and iron studies were normal.  B12 levels were normal at 571.  Haptoglobin TSH folate LDH all normal.  JAK2 mutation, CAL R, MPL mutation negative.  ANA comprehensive panel negative.  BCR ABL FISH testing negative.  ESR elevated at 32.  Interval history patient reports feeling better after she is recovered from urinary tract infection.  She still has some ongoing fatigue and chronic back pain   Review of Systems  Constitutional:  Positive for malaise/fatigue. Negative for chills, fever and weight loss.  HENT:  Negative for congestion, ear discharge and nosebleeds.   Eyes:  Negative for blurred vision.  Respiratory:  Negative for cough, hemoptysis, sputum production, shortness of breath and wheezing.   Cardiovascular:  Negative for chest pain, palpitations, orthopnea and claudication.  Gastrointestinal:  Negative for abdominal pain, blood in stool, constipation, diarrhea, heartburn, melena, nausea and vomiting.  Genitourinary:  Negative for dysuria, flank pain, frequency, hematuria and urgency.  Musculoskeletal:  Positive for back pain. Negative for joint pain and myalgias.  Skin:  Negative for rash.  Neurological:  Negative for dizziness, tingling, focal weakness, seizures, weakness and headaches.  Endo/Heme/Allergies:  Does not bruise/bleed easily.  Psychiatric/Behavioral:  Negative for depression and suicidal ideas. The patient does not have insomnia.    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[place])    Sepsis ([place])     Past Surgical History:  Procedure Laterality Date   CYSTOSCOPY W/ RETROGRADES Bilateral 08/06/2015   Procedure: CYSTOSCOPY WITH RETROGRADE PYELOGRAM;  Surgeon: Hollice Espy, MD;  Location: [facility];  Service: Urology;  Laterality: Bilateral;   CYSTOSCOPY/URETEROSCOPY/HOLMIUM LASER/STENT PLACEMENT Left 08/06/2015   Procedure: CYSTOSCOPY/URETEROSCOPY/HOLMIUM LASER/STENT PLACEMENT;  Surgeon: Hollice Espy, MD;  Location: [facility];  Service: Urology;  Laterality: Left;    Social History   Socioeconomic History   Marital status: Legally Separated    Spouse name: Not on file   Number of children: 2   Years of education: Not on file   Highest education level: Not on file  Occupational History   Not on file  Tobacco Use   Smoking status: Former    Packs/day: 0.30    Years: 11.00    Pack years: 3.30    Types: Cigarettes    Quit date: 2021    Years since quitting: 1.6   Smokeless tobacco: Never  Vaping Use   Vaping Use: Some days  Substance and Sexual Activity   Alcohol use: No    Alcohol/week: 0.0 standard drinks   Drug use: Not Currently    Types: Marijuana    Comment: last used 2021   Sexual activity: Not Currently  Other Topics Concern   Not on file  Social History Narrative   Not on file   Social Determinants of Health   Financial Resource Strain: Low Risk    Difficulty of Paying Living Expenses: Not hard at all  Food Insecurity: No Food Insecurity   Worried About Charity fundraiser in the Last Year: Never true   [place] in the Last Year: Never true  Transportation Needs: No Transportation Needs   Lack of Transportation (Medical): No   Lack of Transportation (Non-Medical): No  Physical Activity: Inactive   Days of Exercise per Week: 0 days   Minutes of Exercise per Session: 0 min  Stress: Stress Concern Present   Feeling of Stress : Rather much  Social Connections: Moderately Isolated   Frequency of Communication  with Friends and Family: Twice a week   Frequency of Social Gatherings with Friends and Family: Twice a week   Attends Religious Services: Never   Marine scientist or Organizations: No   Attends Music therapist: Never   Marital Status: Living with partner  Human resources officer Violence: Not on file    Family History  Problem Relation Age of Onset   Multiple sclerosis Other    Sjogren's syndrome Other        Aunt kim   Kidney Stones Other    Anxiety disorder Mother    Multiple sclerosis Mother    Raynaud syndrome Father    Anxiety disorder Father    Diabetes Father    Anxiety disorder Maternal Grandmother    COPD Maternal Grandmother    Heart disease Maternal Grandfather    Multiple sclerosis Sister    Lung disease Paternal Grandmother  Autoimmune disease Paternal Grandmother    Kidney disease Neg Hx    Bladder Cancer Neg Hx      Current Outpatient Medications:    butalbital-acetaminophen-caffeine (FIORICET) 50-325-40 MG tablet, Take 1 tablet by mouth every 6 (six) hours as needed for headache. (Patient not taking: No sig reported), Disp: 14 tablet, Rfl: 0   clonazePAM (KLONOPIN) 0.5 MG tablet, Take 0.5 tablets (0.25 mg total) by mouth 2 (two) times daily as needed for anxiety. (Patient taking differently: Take 0.25 mg by mouth 2 (two) times daily.), Disp: 30 tablet, Rfl: 0   cyclobenzaprine (FLEXERIL) 5 MG tablet, Take 1 tablet (5 mg total) by mouth 3 (three) times daily as needed for muscle spasms., Disp: 30 tablet, Rfl: 1   feeding supplement (ENSURE ENLIVE / ENSURE PLUS) LIQD, Take 237 mLs by mouth 3 (three) times daily between meals., Disp: 237 mL, Rfl: 12   gabapentin (NEURONTIN) 100 MG capsule, Take 100 mg twice a day for 1 week, then increase to 200 mg twice a day, Disp: , Rfl:    Multiple Vitamin (MULTIVITAMIN WITH MINERALS) TABS tablet, Take 1 tablet by mouth daily., Disp: , Rfl:    nitrofurantoin (MACRODANTIN) 50 MG capsule, Take 1 capsule (50 mg  total) by mouth daily., Disp: 90 capsule, Rfl: 0   nortriptyline (PAMELOR) 10 MG capsule, Take 20 mg by mouth at bedtime., Disp: , Rfl:    ondansetron (ZOFRAN ODT) 4 MG disintegrating tablet, Take 1 tablet (4 mg total) by mouth every 8 (eight) hours as needed for nausea or vomiting., Disp: 30 tablet, Rfl: 0   Ubrogepant 50 MG TABS, Take by mouth. (Patient not taking: Reported on 02/09/2021), Disp: , Rfl:   CT Abdomen Pelvis Wo Contrast  Result Date: 01/21/2021 CLINICAL DATA:  Abdominal pain.  Weight loss. EXAM: CT ABDOMEN AND PELVIS WITHOUT CONTRAST TECHNIQUE: Multidetector CT imaging of the abdomen and pelvis was performed following the standard protocol without IV contrast. COMPARISON:  CT 01/14/2021 FINDINGS: Lower chest: Lung bases are clear. Hepatobiliary: No focal hepatic lesion noncontrast exam. Gallbladder normal. Pancreas: Pancreas is normal. No ductal dilatation. No pancreatic inflammation. Spleen: Normal spleen Adrenals/urinary tract: Adrenal glands normal. Four punctate 1 mm calcifications in the RIGHT kidney. No ureterolithiasis or obstructive uropathy. Two small similar 1 mm calculi in LEFT kidney. No LEFT ureterolithiasis or obstructive uropathy. No bladder calculi. Stomach/Bowel: Stomach, duodenum and small bowel normal. Normal appendix. Moderate to large volume stool in the ascending, transverse and descending colon. Large volume stool in the rectosigmoid colon. No evidence of bowel obstruction. Vascular/Lymphatic: Abdominal aorta is normal caliber. No periportal or retroperitoneal adenopathy. No pelvic adenopathy. Reproductive: Uterus and adnexa unremarkable. Other: No free fluid. Musculoskeletal: No acute osseous abnormality.  No arthropathy. IMPRESSION: 1. No acute findings in the abdomen pelvis. 2. Bilateral small punctate nonobstructing renal calculi. 3. Normal appendix. 4. Moderate to large volume stool throughout the colon suggest constipation. Electronically Signed   By: Suzy Bouchard M.D.   On: 01/21/2021 15:48    No images are attached to the encounter.   CMP Latest Ref Rng & Units 02/09/2021  Glucose 65 - 99 mg/dL 64(L)  BUN 6 - 20 mg/dL 17  Creatinine 0.57 - 1.00 mg/dL 0.73  Sodium 134 - 144 mmol/L 140  Potassium 3.5 - 5.2 mmol/L 4.1  Chloride 96 - 106 mmol/L 100  CO2 20 - 29 mmol/L 25  Calcium 8.7 - 10.2 mg/dL 8.8  Total Protein 6.0 - 8.5 g/dL 7.3  Total Bilirubin 0.0 -  1.2 mg/dL 0.7  Alkaline Phos 44 - 121 IU/L 67  AST 0 - 40 IU/L 14  ALT 0 - 32 IU/L 12   CBC Latest Ref Rng & Units 02/09/2021  WBC 3.4 - 10.8 x10E3/uL 8.8  Hemoglobin 11.1 - 15.9 g/dL 13.0  Hematocrit 34.0 - 46.6 % 39.0  Platelets 150 - 450 x10E3/uL 256     Observation/objective: Appears in no acute distress over video visit today.  Breathing is nonlabored  Assessment and plan: Patient is a 35 year old female referred for following issues  Thrombocytosis: Likely reactive to urinary tract infection.  Platelet count has now decreased to 402 from 800.  BCR ABL FISH, JAK2, CAL R, MPL testing negative.  2.  Normocytic anemia: Improved to a hemoglobin of 11.2.  Iron studies B12 folate haptoglobin TSH LDH all normal.  Anemia likely secondary to acute infection induced bone marrow suppression.  Follow-up instructions: Given overall improvement in her blood counts I will plan to see her back in 3 months with CBC with differential  I discussed the assessment and treatment plan with the patient. The patient was provided an opportunity to ask questions and all were answered. The patient agreed with the plan and demonstrated an understanding of the instructions.   The patient was advised to call back or seek an in-person evaluation if the symptoms worsen or if the condition fails to improve as anticipated.  Visit Diagnosis: 1. Thrombocytosis     Dr. Randa Evens, MD, MPH [facility] Tel- [phone] 02/15/2021 11:27 AM

## 2021-02-19 DIAGNOSIS — F329 Major depressive disorder, single episode, unspecified: Secondary | ICD-10-CM | POA: Diagnosis not present

## 2021-02-20 DIAGNOSIS — M545 Low back pain, unspecified: Secondary | ICD-10-CM | POA: Diagnosis not present

## 2021-02-20 DIAGNOSIS — M533 Sacrococcygeal disorders, not elsewhere classified: Secondary | ICD-10-CM | POA: Diagnosis not present

## 2021-02-23 ENCOUNTER — Other Ambulatory Visit: Payer: Self-pay | Admitting: Nurse Practitioner

## 2021-02-23 NOTE — Telephone Encounter (Signed)
Requested medication (s) are due for refill today: yes  Requested medication (s) are on the active medication list: yes  Last refill:  01/20/21 #30 0 refills  Future visit scheduled: yes in 4 days  Notes to clinic:  not delegated per protocol     Requested Prescriptions  Pending Prescriptions Disp Refills   ondansetron (ZOFRAN-ODT) 4 MG disintegrating tablet [Pharmacy Med Name: ONDANSETRON ODT 4 MG TABLET] 30 tablet 0    Sig: TAKE 1 TABLET BY MOUTH EVERY 8 HOURS AS NEEDED FOR NAUSEA AND VOMITING     Not Delegated - Gastroenterology: Antiemetics Failed - 02/23/2021  2:02 PM      Failed - This refill cannot be delegated      Passed - Valid encounter within last 6 months    Recent Outpatient Visits           2 weeks ago Acute midline low back pain without sciatica   [facility] [place], [name] T, NP   3 weeks ago Migraine with aura and without status migrainosus, not intractable   [facility] [place], [name] T, NP   1 month ago Sepsis secondary to UTI (HCC)   [facility] [place], [name] T, NP   1 month ago Acute midline low back pain without sciatica   [facility] [place], [name] T, NP   4 months ago Migraine with aura and without status migrainosus, not intractable   [facility] [place], Dorie Rank, NP       Future Appointments             In 4 days [place], Dorie Rank, NP [facility]   In 1 month Ruth, Dorie Rank, NP [facility]   In 2 months Creig Hines, MD [facility]

## 2021-02-26 DIAGNOSIS — F329 Major depressive disorder, single episode, unspecified: Secondary | ICD-10-CM | POA: Diagnosis not present

## 2021-02-27 ENCOUNTER — Ambulatory Visit: Payer: Medicaid Other | Admitting: Nurse Practitioner

## 2021-02-27 DIAGNOSIS — F329 Major depressive disorder, single episode, unspecified: Secondary | ICD-10-CM | POA: Diagnosis not present

## 2021-03-03 DIAGNOSIS — F329 Major depressive disorder, single episode, unspecified: Secondary | ICD-10-CM | POA: Diagnosis not present

## 2021-03-06 DIAGNOSIS — F329 Major depressive disorder, single episode, unspecified: Secondary | ICD-10-CM | POA: Diagnosis not present

## 2021-03-07 ENCOUNTER — Encounter: Payer: Self-pay | Admitting: Nurse Practitioner

## 2021-03-09 ENCOUNTER — Ambulatory Visit: Payer: Medicaid Other | Admitting: Nurse Practitioner

## 2021-03-09 DIAGNOSIS — F329 Major depressive disorder, single episode, unspecified: Secondary | ICD-10-CM | POA: Diagnosis not present

## 2021-03-11 DIAGNOSIS — R200 Anesthesia of skin: Secondary | ICD-10-CM | POA: Diagnosis not present

## 2021-03-11 DIAGNOSIS — R5382 Chronic fatigue, unspecified: Secondary | ICD-10-CM | POA: Diagnosis not present

## 2021-03-11 DIAGNOSIS — G4489 Other headache syndrome: Secondary | ICD-10-CM | POA: Diagnosis not present

## 2021-03-11 DIAGNOSIS — R42 Dizziness and giddiness: Secondary | ICD-10-CM | POA: Diagnosis not present

## 2021-03-12 DIAGNOSIS — F329 Major depressive disorder, single episode, unspecified: Secondary | ICD-10-CM | POA: Diagnosis not present

## 2021-03-18 DIAGNOSIS — F329 Major depressive disorder, single episode, unspecified: Secondary | ICD-10-CM | POA: Diagnosis not present

## 2021-03-19 DIAGNOSIS — F329 Major depressive disorder, single episode, unspecified: Secondary | ICD-10-CM | POA: Diagnosis not present

## 2021-03-24 ENCOUNTER — Ambulatory Visit: Payer: Medicaid Other | Admitting: Gastroenterology

## 2021-03-26 DIAGNOSIS — F329 Major depressive disorder, single episode, unspecified: Secondary | ICD-10-CM | POA: Diagnosis not present

## 2021-03-31 DIAGNOSIS — F329 Major depressive disorder, single episode, unspecified: Secondary | ICD-10-CM | POA: Diagnosis not present

## 2021-04-03 DIAGNOSIS — F329 Major depressive disorder, single episode, unspecified: Secondary | ICD-10-CM | POA: Diagnosis not present

## 2021-04-06 DIAGNOSIS — K5900 Constipation, unspecified: Secondary | ICD-10-CM | POA: Diagnosis not present

## 2021-04-06 DIAGNOSIS — R634 Abnormal weight loss: Secondary | ICD-10-CM | POA: Diagnosis not present

## 2021-04-06 DIAGNOSIS — R630 Anorexia: Secondary | ICD-10-CM | POA: Diagnosis not present

## 2021-04-06 DIAGNOSIS — R6881 Early satiety: Secondary | ICD-10-CM | POA: Diagnosis not present

## 2021-04-10 ENCOUNTER — Encounter: Payer: Self-pay | Admitting: Nurse Practitioner

## 2021-04-10 ENCOUNTER — Ambulatory Visit (INDEPENDENT_AMBULATORY_CARE_PROVIDER_SITE_OTHER): Payer: Medicaid Other | Admitting: Nurse Practitioner

## 2021-04-10 ENCOUNTER — Other Ambulatory Visit: Payer: Self-pay

## 2021-04-10 VITALS — BP 97/68 | HR 88 | Temp 99.7°F | Ht 63.0 in | Wt 98.4 lb

## 2021-04-10 DIAGNOSIS — Z0000 Encounter for general adult medical examination without abnormal findings: Secondary | ICD-10-CM

## 2021-04-10 DIAGNOSIS — F32A Depression, unspecified: Secondary | ICD-10-CM | POA: Diagnosis not present

## 2021-04-10 DIAGNOSIS — Z79899 Other long term (current) drug therapy: Secondary | ICD-10-CM | POA: Insufficient documentation

## 2021-04-10 DIAGNOSIS — F909 Attention-deficit hyperactivity disorder, unspecified type: Secondary | ICD-10-CM | POA: Insufficient documentation

## 2021-04-10 DIAGNOSIS — F419 Anxiety disorder, unspecified: Secondary | ICD-10-CM

## 2021-04-10 DIAGNOSIS — F902 Attention-deficit hyperactivity disorder, combined type: Secondary | ICD-10-CM | POA: Diagnosis not present

## 2021-04-10 DIAGNOSIS — G43109 Migraine with aura, not intractable, without status migrainosus: Secondary | ICD-10-CM | POA: Diagnosis not present

## 2021-04-10 DIAGNOSIS — F329 Major depressive disorder, single episode, unspecified: Secondary | ICD-10-CM | POA: Diagnosis not present

## 2021-04-10 DIAGNOSIS — Z136 Encounter for screening for cardiovascular disorders: Secondary | ICD-10-CM

## 2021-04-10 MED ORDER — LISDEXAMFETAMINE DIMESYLATE 30 MG PO CAPS: 30.0000 mg | ORAL_CAPSULE | Freq: Every day | ORAL | 0 refills | Status: DC

## 2021-04-10 NOTE — Assessment & Plan Note (Signed)
Signed today, 04/10/21, and is aware that there is need to reduce to discontinue Klonopin.

## 2021-04-10 NOTE — Assessment & Plan Note (Signed)
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  Have discussed at length from her risks with use of Klonopin and Vyvanse -- At this time discussed with her need to reduce to discontinue this script (Klonopin) as she wishes instead to start on medication for her ADHD.  A verbal agreement was made to work on this reduction to discontinuation while initiating Vyvanse.  Controlled substance contract and UDS today.  She is to return in 3 months for follow-up -- will monitor Klonopin use with goal of discontinuation of this.

## 2021-04-10 NOTE — Progress Notes (Signed)
BP 97/68   Pulse 88   Temp 99.7 F (37.6 C) (Oral)   Ht 5\' 3"  (1.6 m)   Wt 98 lb 6.4 oz (44.6 kg)   SpO2 98%   BMI 17.43 kg/m    Subjective:    Patient ID: , female    DOB: December 08, 1985, 35 y.o.   MRN: [id number]  HPI: Megan Bentley is a 35 y.o. female presenting on 04/10/2021 for comprehensive medical examination. Current medical complaints include:none  She currently lives with: husband Menopausal Symptoms: no  DEPRESSION Continues on Klonopin (last fill 03/13/21) as ordered by neurology.  She is also followed by them for migraines and continues on Pamelor and Gabapentin. Last visit on 03/11/21.  She is interested in starting medication to help with focus.  She reports if she takes this for school she would not take Klonopin anymore -- that is her goal.  She has history of treatment for ADHD, her whole family has this.  In childhood took medication.  We discussed at length that if start medication for attention then would want her to slowly taper off Klonopin due to the counter active actions of each -- she agrees to this and agrees to Q3MOS visits and UDS today. Mood status: controlled Satisfied with current treatment?: yes Symptom severity: mild  Duration of current treatment : chronic Side effects: no Medication compliance: good compliance Psychotherapy/counseling: none Previous psychiatric medications: multiple medications Depressed mood: no Anxious mood: no Anhedonia: no Significant weight loss or gain: no Insomnia: none Fatigue: no Feelings of worthlessness or guilt: no Impaired concentration/indecisiveness: no Suicidal ideations: no Hopelessness: no Crying spells: no Depression screen [facility] 2/9 06/26/2020 05/13/2020 04/10/2020  Decreased Interest 1 1 2   Down, Depressed, Hopeless 1 1 1   PHQ - 2 Score 2 2 3   Altered sleeping 1 1 1   Tired, decreased energy 1 1 3   Change in appetite 1 0 0  Feeling bad or failure about yourself  1 1 1   Trouble  concentrating 0 3 2  Moving slowly or fidgety/restless 0 1 0  Suicidal thoughts 0 0 0  PHQ-9 Score 6 9 10   Difficult doing work/chores - Somewhat difficult Not difficult at all    Depression screen [facility] 2/9 06/26/2020 05/13/2020 04/10/2020  Decreased Interest 1 1 2   Down, Depressed, Hopeless 1 1 1   PHQ - 2 Score 2 2 3   Altered sleeping 1 1 1   Tired, decreased energy 1 1 3   Change in appetite 1 0 0  Feeling bad or failure about yourself  1 1 1   Trouble concentrating 0 3 2  Moving slowly or fidgety/restless 0 1 0  Suicidal thoughts 0 0 0  PHQ-9 Score 6 9 10   Difficult doing work/chores - Somewhat difficult Not difficult at all   Adult ADHD Self Report Scale (most recent)     Adult ADHD Self-Report Scale (ASRS-v1.1) Symptom Checklist - 04/10/21 0949       Part A   1. How often do you have trouble wrapping up the final details of a project, once the challenging parts have been done? Often  2. How often do you have difficulty getting things done in order when you have to do a task that requires organization? Often    3. How often do you have problems remembering appointments or obligations? Often  4. When you have a task that requires a lot of thought, how often do you avoid or delay getting started? Often    5.  How often do you fidget or squirm with your hands or feet when you have to sit down for a long time? Sometimes  6. How often do you feel overly active and compelled to do things, like you were driven by a motor? Often      Part B   7. How often do you make careless mistakes when you have to work on a boring or difficult project? Sometimes  8. How often do you have difficulty keeping your attention when you are doing boring or repetitive work? Often    9. How often do you have difficulty concentrating on what people say to you, even when they are speaking to you directly? Sometimes  10. How often do you misplace or have difficulty finding things at home or at work? Sometimes    11.  How often are you distracted by activity or noise around you? Often  12. How often do you leave your seat in meetings or other situations in which you are expected to remain seated? Sometimes    13. How often do you feel restless or fidgety? Sometimes  14. How often do you have difficulty unwinding and relaxing when you have time to yourself? Rarely    15. How often do you find yourself talking too much when you are in social situations? Sometimes  16. When you are in a conversation, how often do you find yourself finishing the sentences of the people you are talking to, before they can finish them themselves? Rarely    17. How often do you have difficulty waiting your turn in situations when turn taking is required? Rarely  18. How often do you interrupt others when they are busy? Rarely      Comment   How old were you when these problems first began to occur? 8                The patient does not have a history of falls. I did not complete a risk assessment for falls. A plan of care for falls was not documented.   Past Medical History:  Past Medical History:  Diagnosis Date   Allergy    Anxiety    Arthritis    Depression    History of kidney stones    Kidney stone    Migraines    Pneumonia    PONV (postoperative nausea and vomiting)    Preeclampsia    Renal disorder    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HCC)    Sepsis (HCC)     Surgical History:  Past Surgical History:  Procedure Laterality Date   CYSTOSCOPY W/ RETROGRADES Bilateral 08/06/2015   Procedure: CYSTOSCOPY WITH RETROGRADE PYELOGRAM;  Surgeon: [name], MD;  Location: [facility] ORS;  Service: Urology;  Laterality: Bilateral;   CYSTOSCOPY/URETEROSCOPY/HOLMIUM LASER/STENT PLACEMENT Left 08/06/2015   Procedure: CYSTOSCOPY/URETEROSCOPY/HOLMIUM LASER/STENT PLACEMENT;  Surgeon: [name], MD;  Location: [facility] OR[facility];  Service: Urology;  Laterality: Left;    Medications:  Current Outpatient Medications on File Prior to Visit   Medication Sig   clonazePAM (KLONOPIN) 0.5 MG tablet Take 0.5 tablets (0.25 mg total) by mouth 2 (two) times daily as needed for anxiety. (Patient taking differently: Take 0.25 mg by mouth 2 (two) times daily.)   feeding supplement (ENSURE ENLIVE / ENSURE PLUS) LIQD Take 237 mLs by mouth 3 (three) times daily between meals.   gabapentin (NEURONTIN) 100 MG capsule Take 100 mg twice a day for 1 week, then increase to 200 mg twice a day  nortriptyline (PAMELOR) 10 MG capsule Take 20 mg by mouth at bedtime.   ondansetron (ZOFRAN-ODT) 4 MG disintegrating tablet TAKE 1 TABLET BY MOUTH EVERY 8 HOURS AS NEEDED FOR NAUSEA AND VOMITING   Multiple Vitamin (MULTIVITAMIN WITH MINERALS) TABS tablet Take 1 tablet by mouth daily. (Patient not taking: Reported on 04/10/2021)   No current facility-administered medications on file prior to visit.    Allergies:  Allergies  Allergen Reactions   Sulfa Antibiotics Anaphylaxis   Amoxicillin Itching and Other (See Comments)    Burning skin    Social History:  Social History   Socioeconomic History   Marital status: Legally Separated    Spouse name: Not on file   Number of children: 2   Years of education: Not on file   Highest education level: Not on file  Occupational History   Not on file  Tobacco Use   Smoking status: Former    Packs/day: 0.30    Years: 11.00    Pack years: 3.30    Types: Cigarettes    Quit date: 2021    Years since quitting: 1.8   Smokeless tobacco: Never  Vaping Use   Vaping Use: Some days  Substance and Sexual Activity   Alcohol use: No    Alcohol/week: 0.0 standard drinks   Drug use: Not Currently    Types: Marijuana    Comment: last used 2021   Sexual activity: Not Currently  Other Topics Concern   Not on file  Social History Narrative   Not on file   Social Determinants of Health   Financial Resource Strain: Low Risk    Difficulty of Paying Living Expenses: Not hard at all  Food Insecurity: No Food  Insecurity   Worried About Programme researcher, broadcasting/film/video in the Last Year: Never true   Ran Out of Food in the Last Year: Never true  Transportation Needs: No Transportation Needs   Lack of Transportation (Medical): No   Lack of Transportation (Non-Medical): No  Physical Activity: Inactive   Days of Exercise per Week: 0 days   Minutes of Exercise per Session: 0 min  Stress: Stress Concern Present   Feeling of Stress : Rather much  Social Connections: Moderately Isolated   Frequency of Communication with Friends and Family: Twice a week   Frequency of Social Gatherings with Friends and Family: Twice a week   Attends Religious Services: Never   Diplomatic Services operational officer: No   Attends Engineer, structural: Never   Marital Status: Living with partner  Intimate Partner Violence: Not on file   Social History   Tobacco Use  Smoking Status Former   Packs/day: 0.30   Years: 11.00   Pack years: 3.30   Types: Cigarettes   Quit date: 2021   Years since quitting: 1.8  Smokeless Tobacco Never   Social History   Substance and Sexual Activity  Alcohol Use No   Alcohol/week: 0.0 standard drinks    Family History:  Family History  Problem Relation Age of Onset   Multiple sclerosis Other    Sjogren's syndrome Other        Aunt kim   Kidney Stones Other    Anxiety disorder Mother    Multiple sclerosis Mother    Raynaud syndrome Father    Anxiety disorder Father    Diabetes Father    Anxiety disorder Maternal Grandmother    COPD Maternal Grandmother    Heart disease Maternal Grandfather    Multiple  sclerosis Sister    Lung disease Paternal Grandmother    Autoimmune disease Paternal Grandmother    Kidney disease Neg Hx    Bladder Cancer Neg Hx     Past medical history, surgical history, medications, allergies, family history and social history reviewed with patient today and changes made to appropriate areas of the chart.   ROS All other ROS negative except what  is listed above and in the HPI.      Objective:    BP 97/68   Pulse 88   Temp 99.7 F (37.6 C) (Oral)   Ht 5\' 3"  (1.6 m)   Wt 98 lb 6.4 oz (44.6 kg)   SpO2 98%   BMI 17.43 kg/m   Wt Readings from Last 3 Encounters:  04/10/21 98 lb 6.4 oz (44.6 kg)  02/09/21 90 lb 12.8 oz (41.2 kg)  01/28/21 91 lb 11.4 oz (41.6 kg)    Physical Exam Vitals and nursing note reviewed. Exam conducted with a chaperone present.  Constitutional:      General: She is awake. She is not in acute distress.    Appearance: She is well-developed. She is not ill-appearing.  HENT:     Head: Normocephalic and atraumatic.     Right Ear: Hearing, tympanic membrane, ear canal and external ear normal. No drainage.     Left Ear: Hearing, tympanic membrane, ear canal and external ear normal. No drainage.     Nose: Nose normal.     Right Sinus: No maxillary sinus tenderness or frontal sinus tenderness.     Left Sinus: No maxillary sinus tenderness or frontal sinus tenderness.     Mouth/Throat:     Mouth: Mucous membranes are moist.     Pharynx: Oropharynx is clear. Uvula midline. No pharyngeal swelling, oropharyngeal exudate or posterior oropharyngeal erythema.  Eyes:     General: Lids are normal.        Right eye: No discharge.        Left eye: No discharge.     Extraocular Movements: Extraocular movements intact.     Conjunctiva/sclera: Conjunctivae normal.     Pupils: Pupils are equal, round, and reactive to light.     Visual Fields: Right eye visual fields normal and left eye visual fields normal.  Neck:     Thyroid: No thyromegaly.     Vascular: No carotid bruit.     Trachea: Trachea normal.  Cardiovascular:     Rate and Rhythm: Normal rate and regular rhythm.     Heart sounds: Normal heart sounds. No murmur heard.   No gallop.  Pulmonary:     Effort: Pulmonary effort is normal. No accessory muscle usage or respiratory distress.     Breath sounds: Normal breath sounds.  Chest:  Breasts:    Right:  Normal.     Left: Normal.  Abdominal:     General: Bowel sounds are normal.     Palpations: Abdomen is soft. There is no hepatomegaly or splenomegaly.     Tenderness: There is no abdominal tenderness.  Musculoskeletal:        General: Normal range of motion.     Cervical back: Normal range of motion and neck supple.     Right lower leg: No edema.     Left lower leg: No edema.  Lymphadenopathy:     Head:     Right side of head: No submental, submandibular, tonsillar, preauricular or posterior auricular adenopathy.     Left side of head: No  submental, submandibular, tonsillar, preauricular or posterior auricular adenopathy.     Cervical: No cervical adenopathy.     Upper Body:     Right upper body: No supraclavicular, axillary or pectoral adenopathy.     Left upper body: No supraclavicular, axillary or pectoral adenopathy.  Skin:    General: Skin is warm and dry.     Capillary Refill: Capillary refill takes less than 2 seconds.     Findings: No rash.  Neurological:     Mental Status: She is alert and oriented to person, place, and time.     Gait: Gait is intact.     Deep Tendon Reflexes: Reflexes are normal and symmetric.     Reflex Scores:      Brachioradialis reflexes are 2+ on the right side and 2+ on the left side.      Patellar reflexes are 2+ on the right side and 2+ on the left side. Psychiatric:        Attention and Perception: Attention normal.        Mood and Affect: Mood normal.        Speech: Speech normal.        Behavior: Behavior normal. Behavior is cooperative.        Thought Content: Thought content normal.        Judgment: Judgment normal.    Results for orders placed or performed in visit on 03/01/21  HM PAP SMEAR  Result Value Ref Range   HM Pap smear see report scanned into chart       Assessment & Plan:   Problem List Items Addressed This Visit       Cardiovascular and Mediastinum   Migraine with aura and without status migrainosus, not  intractable    Chronic with improved symptoms on current medication regimen.  Continue collaboration with neurology and current regimen as prescribed by them.  Will refill Fioricet as needed, which has offered her benefit when needed.  Recommend minimal use of this.          Other   Anxiety and depression - Primary    Chronic, ongoing, followed by neurology with Klonopin ordered.  She is aware of long term risks of chronic benzo use and aware this script needs to come from neurology, as they initiated this treatment.  At this time discussed with her need to reduce to discontinue this script as she wishes instead to start on medication for her ADHD.  A verbal agreement was made to work on this reduction to discontinuation while initiating Vyvanse.  Controlled substance contract and UDS today.      Relevant Orders   TSH   Controlled substance agreement signed    Signed today, 04/10/21, and is aware that there is need to reduce to discontinue Klonopin.      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  Have discussed at length from her risks with use of Klonopin and Vyvanse -- At this time discussed with her need to reduce to discontinue this script (Klonopin) as she wishes instead to start on medication for her ADHD.  A verbal agreement was made to work on this reduction to discontinuation while initiating Vyvanse.  Controlled substance contract and UDS today.  She is to return in 3 months for follow-up -- will monitor Klonopin use with goal of discontinuation of this.  Relevant Orders   [id number]+Oxyco+Alc+Crt-Bund   Other Visit Diagnoses     Encounter for screening for cardiovascular disorders       Lipid panel today   Relevant Orders   Comprehensive metabolic panel   Lipid Panel w/o Chol/HDL Ratio   Encounter for annual physical exam       Annual physical today with labs and health  maintenance reviewed.  Refuses flu vaccine.   Relevant Orders   CBC with Differential/Platelet        Follow up plan: Return in about 3 months (around 07/11/2021) for ADHD.   LABORATORY TESTING:  - Pap smear: up to date  IMMUNIZATIONS:   - Tdap: Tetanus vaccination status reviewed: last tetanus booster within 10 years. - Influenza: refused - Pneumovax: Not applicable - Prevnar: Not applicable - HPV: Not applicable - Zostavax vaccine: Not applicable  SCREENING: -Mammogram: Not applicable  - Colonoscopy: Not applicable  - Bone Density: Not applicable  -Hearing Test: Not applicable  -Spirometry: Not applicable   PATIENT COUNSELING:   Advised to take 1 mg of folate supplement per day if capable of pregnancy.   Sexuality: Discussed sexually transmitted diseases, partner selection, use of condoms, avoidance of unintended pregnancy  and contraceptive alternatives.   Advised to avoid cigarette smoking.  I discussed with the patient that most people either abstain from alcohol or drink within safe limits (<=14/week and <=4 drinks/occasion for males, <=7/weeks and <= 3 drinks/occasion for females) and that the risk for alcohol disorders and other health effects rises proportionally with the number of drinks per week and how often a drinker exceeds daily limits.  Discussed cessation/primary prevention of drug use and availability of treatment for abuse.   Diet: Encouraged to adjust caloric intake to maintain  or achieve ideal body weight, to reduce intake of dietary saturated fat and total fat, to limit sodium intake by avoiding high sodium foods and not adding table salt, and to maintain adequate dietary potassium and calcium preferably from fresh fruits, vegetables, and low-fat dairy products.    Stressed the importance of regular exercise  Injury prevention: Discussed safety belts, safety helmets, smoke detector, smoking near bedding or upholstery.   Dental health: Discussed  importance of regular tooth brushing, flossing, and dental visits.    NEXT PREVENTATIVE PHYSICAL DUE IN 1 YEAR. Return in about 3 months (around 07/11/2021) for ADHD.

## 2021-04-10 NOTE — Assessment & Plan Note (Signed)
Chronic with improved symptoms on current medication regimen.  Continue collaboration with neurology and current regimen as prescribed by them.  Will refill Fioricet as needed, which has offered her benefit when needed.  Recommend minimal use of this.

## 2021-04-10 NOTE — Assessment & Plan Note (Signed)
Chronic, ongoing, followed by neurology with Klonopin ordered.  She is aware of long term risks of chronic benzo use and aware this script needs to come from neurology, as they initiated this treatment.  At this time discussed with her need to reduce to discontinue this script as she wishes instead to start on medication for her ADHD.  A verbal agreement was made to work on this reduction to discontinuation while initiating Vyvanse.  Controlled substance contract and UDS today.

## 2021-04-10 NOTE — Patient Instructions (Signed)
Living With Attention Deficit Hyperactivity Disorder If you have been diagnosed with attention deficit hyperactivity disorder (ADHD), you may be relieved that you now know why you have felt or behaved a certain way. Still, you may feel overwhelmed about the treatment ahead. You may also wonder how to get the support you need and how to deal with the condition day-to-day. With treatment and support, you can live with ADHD and manage your symptoms. How to manage lifestyle changes Managing stress Stress is your body's reaction to life changes and events, both good and bad. To cope with the stress of an ADHD diagnosis, it may help to: Learn more about ADHD. Exercise regularly. Even a short daily walk can lower stress levels. Participate in training or education programs (including social skills training classes) that teach you to deal with symptoms.  Medicines Your health care provider may suggest certain medicines if he or she feels that they will help to improve your condition. Stimulant medicines are usually prescribed to treat ADHD, and therapy may also be prescribed. It is important to: Avoid using alcohol and other substances that may prevent your medicines from working properly (may interact). Talk with your pharmacist or health care provider about all the medicines that you take, their possible side effects, and what medicines are safe to take together. Make it your goal to take part in all treatment decisions (shared decision-making). Ask about possible side effects of medicines that your health care provider recommends, and tell him or her how you feel about having those side effects. It is best if shared decision-making with your health care provider is part of your total treatment plan. Relationships To strengthen your relationships with family members while treating your condition, consider taking part in family therapy. You might also attend self-help groups alone or with a loved one. Be  honest about how your symptoms affect your relationships. Make an effort to communicate respectfully instead of fighting, and find ways to show others that you care. Psychotherapy may be useful in helping you cope with how ADHD affects your relationships. How to recognize changes in your condition The following signs may mean that your treatment is working well and your condition is improving: Consistently being on time for appointments. Being more organized at home and work. Other people noticing improvements in your behavior. Achieving goals that you set for yourself. Thinking more clearly. The following signs may mean that your treatment is not working very well: Feeling impatience or more confusion. Missing, forgetting, or being late for appointments. An increasing sense of disorganization and messiness. More difficulty in reaching goals that you set for yourself. Loved ones becoming angry or frustrated with you. Follow these instructions at home: Take over-the-counter and prescription medicines only as told by your health care provider. Check with your health care provider before taking any new medicines. Create structure and an organized atmosphere at home. For example: Make a list of tasks, then rank them from most important to least important. Work on one task at a time until your listed tasks are done. Make a daily schedule and follow it consistently every day. Use an appointment calendar, and check it 2 or 3 times a day to keep on track. Keep it with you when you leave the house. Create spaces where you keep certain things, and always put things back in their places after you use them. Keep all follow-up visits as told by your health care provider. This is important. Where to find support Talking to others    Keep emotion out of important discussions and speak in a calm, logical way. Listen closely and patiently to your loved ones. Try to understand their point of view, and try to  avoid getting defensive. Take responsibility for the consequences of your actions. Ask that others do not take your behaviors personally. Aim to solve problems as they come up, and express your feelings instead of bottling them up. Talk openly about what you need from your loved ones and how they can support you. Consider going to family therapy sessions or having your family meet with a specialist who deals with ADHD-related behavior problems. Finances Not all insurance plans cover mental health care, so it is important to check with your insurance carrier. If paying for co-pays or counseling services is a problem, search for a local or county mental health care center. Public mental health care services may be offered there at a low cost or no cost when you are not able to see a private health care provider. If you are taking medicine for ADHD, you may be able to get the generic form, which may be less expensive than brand-name medicine. Some makers of prescription medicines also offer help to patients who cannot afford the medicines that they need. Questions to ask your health care provider: What are the risks and benefits of taking medicines? Would I benefit from therapy? How often should I follow up with a health care provider? Contact a health care provider if: You have side effects from your medicines, such as: Repeated muscle twitches, coughing, or speech outbursts. Sleep problems. Loss of appetite. Depression. New or worsening behavior problems. Dizziness. Unusually fast heartbeat. Stomach pains. Headaches. Get help right away if: You have a severe reaction to a medicine. Your behavior suddenly gets worse. Summary With treatment and support, you can live with ADHD and manage your symptoms. The medicines that are most often prescribed for ADHD are stimulants. Consider taking part in family therapy or self-help groups with family members or friends. When you talk with friends  and family about your ADHD, be patient and communicate openly. Take over-the-counter and prescription medicines only as told by your health care provider. Check with your health care provider before taking any new medicines. This information is not intended to replace advice given to you by your health care provider. Make sure you discuss any questions you have with your health care provider. Document Revised: 11/14/2019 Document Reviewed: 11/14/2019 Elsevier Patient Education  2022 [employer]  

## 2021-04-11 LAB — COMPREHENSIVE METABOLIC PANEL
ALT: 19 IU/L (ref 0–32)
AST: 30 IU/L (ref 0–40)
Albumin/Globulin Ratio: 2.1 (ref 1.2–2.2)
Albumin: 4.7 g/dL (ref 3.8–4.8)
Alkaline Phosphatase: 67 IU/L (ref 44–121)
BUN/Creatinine Ratio: 10 (ref 9–23)
BUN: 10 mg/dL (ref 6–20)
Bilirubin Total: 0.5 mg/dL (ref 0.0–1.2)
CO2: 23 mmol/L (ref 20–29)
Calcium: 9.6 mg/dL (ref 8.7–10.2)
Chloride: 94 mmol/L — ABNORMAL LOW (ref 96–106)
Creatinine, Ser: 0.98 mg/dL (ref 0.57–1.00)
Globulin, Total: 2.2 g/dL (ref 1.5–4.5)
Glucose: 80 mg/dL (ref 70–99)
Potassium: 4.1 mmol/L (ref 3.5–5.2)
Sodium: 135 mmol/L (ref 134–144)
Total Protein: 6.9 g/dL (ref 6.0–8.5)
eGFR: 79 mL/min/{1.73_m2} (ref >59)

## 2021-04-11 LAB — CBC WITH DIFFERENTIAL/PLATELET
Basophils Absolute: 0 10*3/uL (ref 0.0–0.2)
Basos: 0 %
EOS (ABSOLUTE): 0 10*3/uL (ref 0.0–0.4)
Eos: 0 %
Hematocrit: 37 % (ref 34.0–46.6)
Hemoglobin: 12.3 g/dL (ref 11.1–15.9)
Immature Grans (Abs): 0 10*3/uL (ref 0.0–0.1)
Immature Granulocytes: 1 %
Lymphocytes Absolute: 0.7 10*3/uL (ref 0.7–3.1)
Lymphs: 16 %
MCH: 32.5 pg (ref 26.6–33.0)
MCHC: 33.2 g/dL (ref 31.5–35.7)
MCV: 98 fL — ABNORMAL HIGH (ref 79–97)
Monocytes Absolute: 0.6 10*3/uL (ref 0.1–0.9)
Monocytes: 14 %
Neutrophils Absolute: 2.8 10*3/uL (ref 1.4–7.0)
Neutrophils: 69 %
Platelets: 172 10*3/uL (ref 150–450)
RBC: 3.78 x10E6/uL (ref 3.77–5.28)
RDW: 11.3 % — ABNORMAL LOW (ref 11.7–15.4)
WBC: 4.1 10*3/uL (ref 3.4–10.8)

## 2021-04-11 LAB — LIPID PANEL W/O CHOL/HDL RATIO
Cholesterol, Total: 123 mg/dL (ref 100–199)
HDL: 52 mg/dL (ref >39)
LDL Chol Calc (NIH): 58 mg/dL (ref 0–99)
Triglycerides: 59 mg/dL (ref 0–149)
VLDL Cholesterol Cal: 13 mg/dL (ref 5–40)

## 2021-04-11 LAB — TSH: TSH: 0.243 u[IU]/mL — ABNORMAL LOW (ref 0.450–4.500)

## 2021-04-12 ENCOUNTER — Other Ambulatory Visit: Payer: Self-pay

## 2021-04-12 ENCOUNTER — Other Ambulatory Visit: Payer: Self-pay | Admitting: Nurse Practitioner

## 2021-04-12 DIAGNOSIS — R7989 Other specified abnormal findings of blood chemistry: Secondary | ICD-10-CM

## 2021-04-12 NOTE — Progress Notes (Signed)
Contacted via MyChart -- needs lab only visit for 6 weeks please    Good morning Megan Bentley, your labs have returned.  Just waiting on urine drug screen.  Blood work overall is stable, with exception of thyroid lab (TSH) which is on lower side = more hyperthyroid.  I would like you to return in 6 weeks for lab only visit to recheck this please.  If continues to be on lower side I will get imaging of your thyroid and send you to endocrinology.  My staff will call to schedule a lab only visit with you.  Any questions? Keep being amazing!!  Thank you for allowing me to participate in your care.  I appreciate you. Kindest regards, Tabor Denham

## 2021-04-13 DIAGNOSIS — F902 Attention-deficit hyperactivity disorder, combined type: Secondary | ICD-10-CM

## 2021-04-13 MED ORDER — ONDANSETRON 4 MG PO TBDP: 4.0000 mg | ORAL_TABLET | Freq: Three times a day (TID) | ORAL | 0 refills | Status: DC | PRN

## 2021-04-14 ENCOUNTER — Ambulatory Visit: Payer: Self-pay

## 2021-04-14 ENCOUNTER — Encounter: Payer: Self-pay | Admitting: Nurse Practitioner

## 2021-04-14 ENCOUNTER — Telehealth (INDEPENDENT_AMBULATORY_CARE_PROVIDER_SITE_OTHER): Payer: Medicaid Other | Admitting: Nurse Practitioner

## 2021-04-14 ENCOUNTER — Ambulatory Visit: Payer: Self-pay | Admitting: *Deleted

## 2021-04-14 DIAGNOSIS — U071 COVID-19: Secondary | ICD-10-CM

## 2021-04-14 MED ORDER — PREDNISONE 10 MG PO TABS: 10.0000 mg | ORAL_TABLET | Freq: Every day | ORAL | 0 refills | Status: DC

## 2021-04-14 MED ORDER — MOLNUPIRAVIR EUA 200MG CAPSULE: 4.0000 | ORAL_CAPSULE | Freq: Two times a day (BID) | ORAL | 0 refills | Status: AC

## 2021-04-14 NOTE — Telephone Encounter (Addendum)
Pt called in stating she has tested positive for COVID on 04/12/21. She had been exposed to her grandmother and also went to a halloween festival with her family. She and her young children and fiance are all sick with similar symptoms. Pt c/o SOB and feels like COVID has attacked her lungs, Chills, headache, sore throat, and body aches. She says she has had a fever that's got up to 102.7 orally. She says that she has been taking chloraseptic cough drops, Mucinex cold and flu, Tylenol, and Goody's for her headache. Pt is scheduled for a Mychart appt at 1100 today. Care advice given and pt verbalized understanding.   Reason for Disposition  MILD difficulty breathing (e.g., minimal/no SOB at rest, SOB with walking, pulse <100)  Answer Assessment - Initial Assessment Questions 1. COVID-19 DIAGNOSIS: "Who made your COVID-19 diagnosis?" "Was it confirmed by a positive lab test or self-test?" If not diagnosed by a doctor (or NP/PA), ask "Are there lots of cases (community spread) where you live?" Note: See public health department website, if unsure.     Home self test  2. COVID-19 EXPOSURE: "Was there any known exposure to COVID before the symptoms began?" CDC Definition of close contact: within 6 feet (2 meters) for a total of 15 minutes or more over a 24-hour period.      Yes 3. ONSET: "When did the COVID-19 symptoms start?"      04/10/21 4. WORST SYMPTOM: "What is your worst symptom?" (e.g., cough, fever, shortness of breath, muscle aches)     Affected lungs,  5. COUGH: "Do you have a cough?" If Yes, ask: "How bad is the cough?"       Yes, greenish mucus 6. FEVER: "Do you have a fever?" If Yes, ask: "What is your temperature, how was it measured, and when did it start?"     102.7 7. RESPIRATORY STATUS: "Describe your breathing?" (e.g., shortness of breath, wheezing, unable to speak)      SOB 8. BETTER-SAME-WORSE: "Are you getting better, staying the same or getting worse compared to yesterday?"  If  getting worse, ask, "In what way?"     same 9. HIGH RISK DISEASE: "Do you have any chronic medical problems?" (e.g., asthma, heart or lung disease, weak immune system, obesity, etc.)     Weak immune system  10. VACCINE: "Have you had the COVID-19 vaccine?" If Yes, ask: "Which one, how many shots, when did you get it?"       No 11. BOOSTER: "Have you received your COVID-19 booster?" If Yes, ask: "Which one and when did you get it?"       No 12. PREGNANCY: "Is there any chance you are pregnant?" "When was your last menstrual period?"       No 13. OTHER SYMPTOMS: "Do you have any other symptoms?"  (e.g., chills, fatigue, headache, loss of smell or taste, muscle pain, sore throat)       Chills, headache, sore throat  14. O2 SATURATION MONITOR:  "Do you use an oxygen saturation monitor (pulse oximeter) at home?" If Yes, ask "What is your reading (oxygen level) today?" "What is your usual oxygen saturation reading?" (e.g., 95%)       No  Protocols used: Coronavirus (COVID-19) Diagnosed or Suspected-A-AH

## 2021-04-14 NOTE — Telephone Encounter (Signed)
Reason for Disposition  [1] Caller has URGENT medicine question about med that PCP or specialist prescribed AND [2] triager unable to answer question  Patient sounds very sick or weak to the triager    See triage notes  Answer Assessment - Initial Assessment Questions 1. NAME of MEDICATION: "What medicine are you calling about?"     I took a medication an hour ago and I have a horrible itchy rash on the back and front of my leg.   It's from my ankles to behind my knees and front of my legs too both legs. Molnupiravir 200 mg is what I took.    I only took 2 pills an hour ago.   I'm suppose to take 4 pills but I'm sensitive to medications. 2. QUESTION: "What is your question?" (e.g., double dose of medicine, side effect)     Should I continue this medication with this rash? 3. PRESCRIBING HCP: "Who prescribed it?" Reason: if prescribed by specialist, call should be referred to that group.     I don't remember her name. 4. SYMPTOMS: "Do you have any symptoms?"     I feel a little nausea.      I'm positive for Covid.    5. SEVERITY: If symptoms are present, ask "Are they mild, moderate or severe?"     Severe itching.    6. PREGNANCY:  "Is there any chance that you are pregnant?" "When was your last menstrual period?"     No  Answer Assessment - Initial Assessment Questions 1. APPEARANCE: "What does the rash look like?"      The rash looks like hives on my legs 2. LOCATION: "Where is the rash located?"      Both legs within the hour of taking the molnupiravir for Covid. 3. NUMBER: "How many hives are there?"      Many hives 4. SIZE: "How big are the hives?" (inches, cm, compare to coins) "Do they all look the same or is there lots of variation in shape and size?"      Like clusters of bumps on the front of my legs 5. ONSET: "When did the hives begin?" (Hours or days ago)      A hour ago 6. ITCHING: "Does it itch?" If Yes, ask: "How bad is the itch?"    - MILD: doesn't interfere with  normal activities   - MODERATE-SEVERE: interferes with work, school, sleep, or other activities      Yes 7. RECURRENT PROBLEM: "Have you had hives before?" If Yes, ask: "When was the last time?" and "What happened that time?"      Yes sulfa drugs 8. TRIGGERS: "Were you exposed to any new food, plant, cosmetic product or animal just before the hives began?"     The COvid medication 9. OTHER SYMPTOMS: "Do you have any other symptoms?" (e.g., fever, tongue swelling, difficulty breathing, abdominal pain)     Positive Covid 10. PREGNANCY: "Is there any chance you are pregnant?" "When was your last menstrual period?"       No  Protocols used: Medication Question Call-A-AH, Hives-A-AH

## 2021-04-14 NOTE — Telephone Encounter (Signed)
Pt called in c/o breaking out in hives within the hour after taking her first dose of molnupiravir for Covid.   She did a video visit with Larae Grooms, NP today and was prescribed it along with prednisone which she has not taken. She denies shortness of breath, chest tightness, throat swelling/closing or hives on the upper part of her body.   The hives are located on both of her legs and are itching bad.  She's also having nausea but she's not sure if it's from Covid because she feels bad anyway or part of the reaction to the medication.  She looked to see if she had any Benadryl but she didn't have any kind of antihistamine.   I asked if she was home alone other than her children there with her and her finance is there but he is sleeping.   "I can wake him up if I need to".    "I'm going to run up to the store near here and get some Benadryl now and I promise I will call 911 if I start experiencing any symptoms you explained to me".    The hives are from her ankles to the backs of the bends of her knees multiple clusters.     I went over the s/s to watch for and call 911 if they occur:  shortness of breath, chest tightness, throat swelling or swelling in and around her mouth, the hives spread to her upper body meaning her chest area and above or just not feeing right.   She replied,   "I promise I will call 911 if any of those things occur".      I tried calling into [facility] but there was no answer to make them aware.   I am sending my notes to their office high priority so they will be aware of her situation.

## 2021-04-14 NOTE — Telephone Encounter (Signed)
Please see below and advise accordingly/contact patient.

## 2021-04-14 NOTE — Progress Notes (Signed)
There were no vitals taken for this visit.   Subjective:    Patient ID: Megan Bentley, female    DOB: 04/21/1986, 35 y.o.   MRN: [id number]  HPI: Megan Bentley is a 35 y.o. female  Chief Complaint  Patient presents with   Covid Positive   UPPER RESPIRATORY TRACT INFECTION Worst symptom: Tested positive for COVID on 04/11/2021.  Patient's symptoms started on 04/10/2021 Fever: yes Cough: yes Shortness of breath:  a little bit Wheezing:  sometimes when she sleeps Chest pain: yes Chest tightness: yes Chest congestion: yes- she is able to cough it up Nasal congestion: yes Runny nose: no Post nasal drip: yes Sneezing: yes Sore throat: yes Swollen glands: no Sinus pressure: no Headache: yes Face pain: yes Toothache: no Ear pain: no bilateral Ear pressure: no bilateral Eyes red/itching:no Eye drainage/crusting: no  Vomiting: no Rash: no Fatigue: yes Sick contacts: yes Strep contacts: no  Context: better Recurrent sinusitis: no Relief with OTC cold/cough medications: yes  Treatments attempted: mucinex   Relevant past medical, surgical, family and social history reviewed and updated as indicated. Interim medical history since our last visit reviewed. Allergies and medications reviewed and updated.  Review of Systems  Constitutional:  Positive for fatigue and fever.  HENT:  Positive for congestion, dental problem, postnasal drip, rhinorrhea, sneezing and sore throat. Negative for ear pain, sinus pressure and sinus pain.   Respiratory:  Positive for cough, shortness of breath and wheezing.   Cardiovascular:  Positive for chest pain.  Gastrointestinal:  Negative for vomiting.  Skin:  Negative for rash.  Neurological:  Positive for headaches.   Per HPI unless specifically indicated above     Objective:    There were no vitals taken for this visit.  Wt Readings from Last 3 Encounters:  04/10/21 98 lb 6.4 oz (44.6 kg)  02/09/21 90 lb 12.8 oz (41.2 kg)  01/28/21  91 lb 11.4 oz (41.6 kg)    Physical Exam Vitals and nursing note reviewed.  HENT:     Head: Normocephalic.     Right Ear: Hearing normal.     Left Ear: Hearing normal.     Nose: Nose normal.  Eyes:     Pupils: Pupils are equal, round, and reactive to light.  Pulmonary:     Effort: Pulmonary effort is normal. No respiratory distress.  Neurological:     Mental Status: She is alert.  Psychiatric:        Mood and Affect: Mood normal.        Behavior: Behavior normal.        Thought Content: Thought content normal.        Judgment: Judgment normal.    Results for orders placed or performed in visit on 04/10/21  CBC with Differential/Platelet  Result Value Ref Range   WBC 4.1 3.4 - 10.8 x10E3/uL   RBC 3.78 3.77 - 5.28 x10E6/uL   Hemoglobin 12.3 11.1 - 15.9 g/dL   Hematocrit 37.0 34.0 - 46.6 %   MCV 98 (H) 79 - 97 fL   MCH 32.5 26.6 - 33.0 pg   MCHC 33.2 31.5 - 35.7 g/dL   RDW 11.3 (L) 11.7 - 15.4 %   Platelets 172 150 - 450 x10E3/uL   Neutrophils 69 Not Estab. %   Lymphs 16 Not Estab. %   Monocytes 14 Not Estab. %   Eos 0 Not Estab. %   Basos 0 Not Estab. %   Neutrophils Absolute 2.8 1.4 -  7.0 x10E3/uL   Lymphocytes Absolute 0.7 0.7 - 3.1 x10E3/uL   Monocytes Absolute 0.6 0.1 - 0.9 x10E3/uL   EOS (ABSOLUTE) 0.0 0.0 - 0.4 x10E3/uL   Basophils Absolute 0.0 0.0 - 0.2 x10E3/uL   Immature Granulocytes 1 Not Estab. %   Immature Grans (Abs) 0.0 0.0 - 0.1 x10E3/uL  Comprehensive metabolic panel  Result Value Ref Range   Glucose 80 70 - 99 mg/dL   BUN 10 6 - 20 mg/dL   Creatinine, Ser 0.98 0.57 - 1.00 mg/dL   eGFR 79 >59 mL/min/1.73   BUN/Creatinine Ratio 10 9 - 23   Sodium 135 134 - 144 mmol/L   Potassium 4.1 3.5 - 5.2 mmol/L   Chloride 94 (L) 96 - 106 mmol/L   CO2 23 20 - 29 mmol/L   Calcium 9.6 8.7 - 10.2 mg/dL   Total Protein 6.9 6.0 - 8.5 g/dL   Albumin 4.7 3.8 - 4.8 g/dL   Globulin, Total 2.2 1.5 - 4.5 g/dL   Albumin/Globulin Ratio 2.1 1.2 - 2.2   Bilirubin Total  0.5 0.0 - 1.2 mg/dL   Alkaline Phosphatase 67 44 - 121 IU/L   AST 30 0 - 40 IU/L   ALT 19 0 - 32 IU/L  Lipid Panel w/o Chol/HDL Ratio  Result Value Ref Range   Cholesterol, Total 123 100 - 199 mg/dL   Triglycerides 59 0 - 149 mg/dL   HDL 52 >39 mg/dL   VLDL Cholesterol Cal 13 5 - 40 mg/dL   LDL Chol Calc (NIH) 58 0 - 99 mg/dL  TSH  Result Value Ref Range   TSH 0.243 (L) 0.450 - 4.500 uIU/mL  [id number] 11+Oxyco+Alc+Crt-Bund  Result Value Ref Range   Ethanol WILL FOLLOW    Amphetamines, Urine WILL FOLLOW    Barbiturate WILL FOLLOW    BARBITURATES WILL FOLLOW    Amobarbital WILL FOLLOW    Amobarbital GC/MS Conf WILL FOLLOW    Secobarbital WILL FOLLOW      SECOBARBITAL GC/MS CONF WILL FOLLOW    Butalbital WILL FOLLOW    Butalbital GC/MS WILL FOLLOW    Pentobarbital Lvl WILL FOLLOW    Pentobarbital GC/MS Conf WILL FOLLOW    Phenobarbital WILL FOLLOW    Phenobarbital, GC/MS WILL FOLLOW    BENZODIAZ UR QL WILL FOLLOW    Cannabinoid Quant, Ur WILL FOLLOW    CANNABINOIDS WILL FOLLOW    Carboxy THC GC/MS Conf WILL FOLLOW    Cocaine (Metabolite) WILL FOLLOW    OPIATE SCREEN URINE WILL FOLLOW    Oxycodone/Oxymorphone, Urine WILL FOLLOW    Phencyclidine WILL FOLLOW    Methadone Screen, Urine WILL FOLLOW    Propoxyphene WILL FOLLOW    Meperidine WILL FOLLOW    Tramadol WILL FOLLOW    Creatinine WILL FOLLOW    pH, Urine WILL FOLLOW       Assessment & Plan:   Problem List Items Addressed This Visit   None Visit Diagnoses     COVID-19    -  Primary   Complete course of Molnupiravir and prednisone. Discussed s/s to monitor for.  Discussed how to properly take medications.  Follow up if symptoms worsen.   Relevant Medications   molnupiravir EUA (LAGEVRIO) 200 mg CAPS capsule        Follow up plan: Return if symptoms worsen or fail to improve.   This visit was completed via MyChart due to the restrictions of the COVID-19 pandemic. All issues as above were discussed and  addressed. Physical exam was done as above through visual confirmation on MyChart. If it was felt that the patient should be evaluated in the office, they were directed there. The patient verbally consented to this visit. Location of the patient: Home Location of the provider: Office Those involved with this call:  Provider: Jon Billings, NP CMA: None Front Desk/Registration: Myrlene Broker This encounter was conducted via video.  I spent 15 dedicated to the care of this patient on the date of this encounter to include previsit review of 20, face to face time with the patient, and post visit ordering of testing.

## 2021-04-14 NOTE — Progress Notes (Signed)
Unable to leave vm to schedule an appt.

## 2021-04-14 NOTE — Telephone Encounter (Signed)
Reviewed the triage note. If patient is still having symptoms she should be seen in the ER.

## 2021-04-14 NOTE — Telephone Encounter (Signed)
Just an fyi for today's appointment.

## 2021-04-15 ENCOUNTER — Ambulatory Visit: Admit: 2021-04-15 | Discharge status: Absent without leave | Payer: Medicaid Other

## 2021-04-15 LAB — DRUG SCREEN 764883 11+OXYCO+ALC+CRT-BUND
Amphetamines, Urine: NEGATIVE ng/mL
BENZODIAZ UR QL: NEGATIVE ng/mL
Cocaine (Metabolite): NEGATIVE ng/mL
Creatinine: 27.4 mg/dL (ref 20.0–300.0)
Ethanol: NEGATIVE %
Meperidine: NEGATIVE ng/mL
Methadone Screen, Urine: NEGATIVE ng/mL
OPIATE SCREEN URINE: NEGATIVE ng/mL
Oxycodone/Oxymorphone, Urine: NEGATIVE ng/mL
Phencyclidine: NEGATIVE ng/mL
Propoxyphene: NEGATIVE ng/mL
Tramadol: NEGATIVE ng/mL
pH, Urine: 5.8 (ref 4.5–8.9)

## 2021-04-15 LAB — CANNABINOID CONFIRMATION, UR
CANNABINOIDS: POSITIVE — AB
Carboxy THC GC/MS Conf: 16 ng/mL

## 2021-04-15 LAB — DRUG PROFILE 799023
Amobarbital: NEGATIVE
BARBITURATES: POSITIVE — AB
Butalbital GC/MS: 746 ng/mL
Butalbital: POSITIVE — AB
Pentobarbital Lvl: NEGATIVE
Phenobarbital: NEGATIVE
Secobarbital: NEGATIVE

## 2021-04-15 NOTE — Telephone Encounter (Signed)
Spoke with patient and notified her of Megan Bentley recommendations. Patient states the rash did not spread and she took 3 Benadryl tablets and she says she feels better. Patient was advised to give our office a call back if her symptoms worsen. Patient verbalized understanding and has no further questions.

## 2021-04-15 NOTE — Progress Notes (Signed)
Contacted via MyChart   Good evening Megan Bentley, your urine drug screen has returned.  It did come back positive for medication in your Fioricet which you take for migraines, but it also returned positive for marijuana -- I recommend you avoid using this when taking Vyvanse for attention as could cause interactions and not so great side effects.  Any questions? Keep being amazing!!  Thank you for allowing me to participate in your care.  I appreciate you. Kindest regards, Yuvaan Olander

## 2021-04-16 ENCOUNTER — Other Ambulatory Visit: Payer: Self-pay | Admitting: Nurse Practitioner

## 2021-04-16 DIAGNOSIS — F902 Attention-deficit hyperactivity disorder, combined type: Secondary | ICD-10-CM

## 2021-04-16 NOTE — Progress Notes (Signed)
Pt is scheduled °

## 2021-04-17 DIAGNOSIS — F329 Major depressive disorder, single episode, unspecified: Secondary | ICD-10-CM | POA: Diagnosis not present

## 2021-04-18 ENCOUNTER — Encounter: Payer: Self-pay | Admitting: Emergency Medicine

## 2021-04-18 ENCOUNTER — Other Ambulatory Visit: Payer: Self-pay

## 2021-04-18 DIAGNOSIS — Z5321 Procedure and treatment not carried out due to patient leaving prior to being seen by health care provider: Secondary | ICD-10-CM | POA: Insufficient documentation

## 2021-04-18 DIAGNOSIS — R509 Fever, unspecified: Secondary | ICD-10-CM | POA: Insufficient documentation

## 2021-04-18 DIAGNOSIS — T7840XA Allergy, unspecified, initial encounter: Secondary | ICD-10-CM | POA: Diagnosis not present

## 2021-04-18 DIAGNOSIS — F329 Major depressive disorder, single episode, unspecified: Secondary | ICD-10-CM | POA: Diagnosis not present

## 2021-04-18 DIAGNOSIS — U071 COVID-19: Secondary | ICD-10-CM | POA: Insufficient documentation

## 2021-04-18 LAB — COMPREHENSIVE METABOLIC PANEL
ALT: 13 U/L (ref 0–44)
AST: 13 U/L — ABNORMAL LOW (ref 15–41)
Albumin: 4.3 g/dL (ref 3.5–5.0)
Alkaline Phosphatase: 46 U/L (ref 38–126)
Anion gap: 7 (ref 5–15)
BUN: 11 mg/dL (ref 6–20)
CO2: 25 mmol/L (ref 22–32)
Calcium: 9.1 mg/dL (ref 8.9–10.3)
Chloride: 105 mmol/L (ref 98–111)
Creatinine, Ser: 0.6 mg/dL (ref 0.44–1.00)
GFR, Estimated: >60 mL/min (ref >60)
Glucose, Bld: 124 mg/dL — ABNORMAL HIGH (ref 70–99)
Potassium: 3.3 mmol/L — ABNORMAL LOW (ref 3.5–5.1)
Sodium: 137 mmol/L (ref 135–145)
Total Bilirubin: 1 mg/dL (ref 0.3–1.2)
Total Protein: 7.3 g/dL (ref 6.5–8.1)

## 2021-04-18 LAB — CBC
HCT: 38.9 % (ref 36.0–46.0)
Hemoglobin: 13.9 g/dL (ref 12.0–15.0)
MCH: 33.3 pg (ref 26.0–34.0)
MCHC: 35.7 g/dL (ref 30.0–36.0)
MCV: 93.1 fL (ref 80.0–100.0)
Platelets: 237 10*3/uL (ref 150–400)
RBC: 4.18 MIL/uL (ref 3.87–5.11)
RDW: 11.7 % (ref 11.5–15.5)
WBC: 6.1 10*3/uL (ref 4.0–10.5)
nRBC: 0 % (ref 0.0–0.2)

## 2021-04-18 LAB — URINALYSIS, COMPLETE (UACMP) WITH MICROSCOPIC
Bilirubin Urine: NEGATIVE
Glucose, UA: NEGATIVE mg/dL
Hgb urine dipstick: NEGATIVE
Ketones, ur: NEGATIVE mg/dL
Nitrite: NEGATIVE
Protein, ur: NEGATIVE mg/dL
Specific Gravity, Urine: 1.01 (ref 1.005–1.030)
WBC, UA: >50 WBC/hpf — ABNORMAL HIGH (ref 0–5)
pH: 6 (ref 5.0–8.0)

## 2021-04-18 LAB — LIPASE, BLOOD: Lipase: 35 U/L (ref 11–51)

## 2021-04-18 LAB — LACTIC ACID, PLASMA: Lactic Acid, Venous: 0.9 mmol/L (ref 0.5–1.9)

## 2021-04-18 NOTE — ED Triage Notes (Signed)
Pt via POV from home. Pt lower back pain and lower abd pain. Pt states that her urine is dark. States that she has had fevers and chills. Pt has recently dx with COVID.  Pt is A&Ox4 and NAD.

## 2021-04-18 NOTE — ED Provider Notes (Signed)
Emergency Medicine Provider Triage Evaluation Note  Megan Bentley , a 35 y.o. female  was evaluated in triage.  Pt complains of Pt tested positive for covid one week ago (sxs were cough and fever- this has cleared)- She was given ? Medication and had allergic reaction.and then was given prednisone - She has hx of reaction to prednisone causing admission 2 months ago  She now c/o abd pain and back pain with decreased urine output   Review of Systems  Positive: Abd/back pain, N/V Negative: Fever, headache, dizziness, diarrhea, cough   Physical Exam  BP 126/86 (BP Location: Left Arm)   Pulse 97   Temp 98.5 F (36.9 C) (Oral)   Resp 20   Ht 5\' 4"  (1.626 m)   Wt 44.5 kg   SpO2 97%   BMI 16.82 kg/m  Gen:   Awake, no distress   Resp:  Normal effort  MSK:   Moves extremities without difficulty  Other:    Medical Decision Making  Medically screening exam initiated at 3:49 PM.  Appropriate orders placed.  was informed that the remainder of the evaluation will be completed by another provider, this initial triage assessment does not replace that evaluation, and the importance of remaining in the ED until their evaluation is complete.  Routine labs    [name], NP 04/18/21 1552    13/05/22, MD 04/18/21 13/05/22

## 2021-04-19 ENCOUNTER — Emergency Department: Payer: Medicaid Other

## 2021-04-19 ENCOUNTER — Emergency Department
Admission: EM | Admit: 2021-04-19 | Discharge: 2021-04-19 | Disposition: A | Discharge status: Home / Self Care | Payer: Medicaid Other | Attending: Student | Admitting: Student

## 2021-04-19 LAB — PREGNANCY, URINE: Preg Test, Ur: NEGATIVE

## 2021-04-19 NOTE — ED Notes (Signed)
Called pt several times no answer  

## 2021-04-20 ENCOUNTER — Telehealth (INDEPENDENT_AMBULATORY_CARE_PROVIDER_SITE_OTHER): Payer: Medicaid Other | Admitting: Nurse Practitioner

## 2021-04-20 ENCOUNTER — Encounter: Payer: Self-pay | Admitting: Nurse Practitioner

## 2021-04-20 DIAGNOSIS — R399 Unspecified symptoms and signs involving the genitourinary system: Secondary | ICD-10-CM | POA: Diagnosis not present

## 2021-04-20 LAB — URINE CULTURE: Culture: <10000 — AB

## 2021-04-20 NOTE — Progress Notes (Signed)
There were no vitals taken for this visit.   Subjective:    Patient ID: Megan Bentley, female    DOB: 11/05/1985, 35 y.o.   MRN: [id number]  HPI: Megan Bentley is a 35 y.o. female  Chief Complaint  Patient presents with   Urinary Tract Infection   Cystitis   Back Pain    Patient states she became symptomatic the day she was prescribed Prednisone by our office. Patient states she started to notice her urine was darker, back and abdominal pain. Patient states she having burning with urination and decreased appetite. Patient states she has not been taking any of her prescription medications, other than Gabapentin since she has been having back pain. Patient states she went to the ER and was never seen due to 18 hour wait. Patient states the pain comes and goes.    This visit was completed via video visit through MyChart due to the restrictions of the COVID-19 pandemic. All issues as above were discussed and addressed. Physical exam was done as above through visual confirmation on video through MyChart. If it was felt that the patient should be evaluated in the office, they were directed there. The patient verbally consented to this visit. Location of the patient: home Location of the provider: work Those involved with this call:  Provider: Marnee Guarneri, DNP CMA: Irena Reichmann, [place] Desk/Registration: [employer]  Time spent on call:  21 minutes with patient face to face via video conference. More than 50% of this time was spent in counseling and coordination of care. 15 minutes total spent in review of patient's record and preparation of their chart.  I verified patient identity using two factors (patient name and date of birth). Patient consents verbally to being seen via telemedicine visit today.    URINARY SYMPTOMS Symptoms started after starting Prednisone for Covid, her urine was darker in color and abdomen/back hurting. Dysuria: yes Urinary frequency: no Urgency:  no Small volume voids: yes Symptom severity:yes Urinary incontinence: no Foul odor: no Hematuria: yes Abdominal pain: yes Back pain: yes Suprapubic pain/pressure: yes Flank pain: no Fever:  no Vomiting: yes Status: worse Previous urinary tract infection: yes Recurrent urinary tract infection: no Sexual activity: monogamous History of sexually transmitted disease: no Treatments attempted: increasing fluids    Relevant past medical, surgical, family and social history reviewed and updated as indicated. Interim medical history since our last visit reviewed. Allergies and medications reviewed and updated.  Review of Systems  Constitutional:  Negative for activity change, appetite change, diaphoresis, fatigue and fever.  Respiratory:  Negative for cough, chest tightness and shortness of breath.   Cardiovascular:  Negative for chest pain, palpitations and leg swelling.  Gastrointestinal:  Positive for abdominal pain and nausea. Negative for abdominal distention, blood in stool, constipation, diarrhea and vomiting.  Genitourinary:  Positive for decreased urine volume, dysuria and hematuria. Negative for frequency, pelvic pain, urgency and vaginal discharge.  Neurological: Negative.   Psychiatric/Behavioral: Negative.     Per HPI unless specifically indicated above     Objective:    There were no vitals taken for this visit.  Wt Readings from Last 3 Encounters:  04/18/21 98 lb (44.5 kg)  04/10/21 98 lb 6.4 oz (44.6 kg)  02/09/21 90 lb 12.8 oz (41.2 kg)    Physical Exam Vitals and nursing note reviewed.  Constitutional:      General: She is awake. She is not in acute distress.    Appearance: She is well-developed.  She is not ill-appearing.  HENT:     Head: Normocephalic.     Right Ear: Hearing normal.     Left Ear: Hearing normal.  Eyes:     General: Lids are normal.        Right eye: No discharge.        Left eye: No discharge.     Conjunctiva/sclera: Conjunctivae  normal.  Pulmonary:     Effort: Pulmonary effort is normal. No accessory muscle usage or respiratory distress.  Musculoskeletal:     Cervical back: Normal range of motion.  Neurological:     Mental Status: She is alert and oriented to person, place, and time.  Psychiatric:        Attention and Perception: Attention normal.        Mood and Affect: Mood normal.        Behavior: Behavior normal. Behavior is cooperative.        Thought Content: Thought content normal.        Judgment: Judgment normal.   Results for orders placed or performed during the hospital encounter of 04/19/21  Urine Culture   Specimen: Urine, Clean Catch  Result Value Ref Range   Specimen Description      URINE, CLEAN CATCH Performed at [facility], [address], [place], [place] [address]    Special Requests      NONE Performed at [facility], [address] [place], [place] [address]    Culture (A)     <10,000 COLONIES/mL INSIGNIFICANT GROWTH Performed at [facility], [address] [place], [place] [address]    Report Status 04/20/2021 FINAL   Urinalysis, Complete w Microscopic  Result Value Ref Range   Color, Urine YELLOW (A) YELLOW   APPearance CLOUDY (A) CLEAR   Specific Gravity, Urine 1.010 1.005 - 1.030   pH 6.0 5.0 - 8.0   Glucose, UA NEGATIVE NEGATIVE mg/dL   Hgb urine dipstick NEGATIVE NEGATIVE   Bilirubin Urine NEGATIVE NEGATIVE   Ketones, ur NEGATIVE NEGATIVE mg/dL   Protein, ur NEGATIVE NEGATIVE mg/dL   Nitrite NEGATIVE NEGATIVE   Leukocytes,Ua LARGE (A) NEGATIVE   RBC / HPF 11-20 0 - 5 RBC/hpf   WBC, UA >50 (H) 0 - 5 WBC/hpf   Bacteria, UA MANY (A) NONE SEEN   Squamous Epithelial / LPF 21-50 0 - 5   Mucus PRESENT    Budding Yeast PRESENT   CBC  Result Value Ref Range   WBC 6.1 4.0 - 10.5 K/uL   RBC 4.18 3.87 - 5.11 MIL/uL   Hemoglobin 13.9 12.0 - 15.0 g/dL   HCT 33.2 95.1 - 88.4 %   MCV 93.1 80.0 - 100.0 fL   MCH 33.3 26.0 - 34.0 pg   MCHC  35.7 30.0 - 36.0 g/dL   RDW 16.6 06.3 - 01.6 %   Platelets 237 150 - 400 K/uL   nRBC 0.0 0.0 - 0.2 %  Comprehensive metabolic panel  Result Value Ref Range   Sodium 137 135 - 145 mmol/L   Potassium 3.3 (L) 3.5 - 5.1 mmol/L   Chloride 105 98 - 111 mmol/L   CO2 25 22 - 32 mmol/L   Glucose, Bld 124 (H) 70 - 99 mg/dL   BUN 11 6 - 20 mg/dL   Creatinine, Ser 0.10 0.44 - 1.00 mg/dL   Calcium 9.1 8.9 - 93.2 mg/dL   Total Protein 7.3 6.5 - 8.1 g/dL   Albumin 4.3 3.5 - 5.0  g/dL   AST 13 (L) 15 - 41 U/L   ALT 13 0 - 44 U/L   Alkaline Phosphatase 46 38 - 126 U/L   Total Bilirubin 1.0 0.3 - 1.2 mg/dL   GFR, Estimated >60 >60 mL/min   Anion gap 7 5 - 15  Lactic acid, plasma  Result Value Ref Range   Lactic Acid, Venous 0.9 0.5 - 1.9 mmol/L  Lipase, blood  Result Value Ref Range   Lipase 35 11 - 51 U/L  Pregnancy, urine  Result Value Ref Range   Preg Test, Ur NEGATIVE NEGATIVE      Assessment & Plan:   Problem List Items Addressed This Visit       Other   Urinary tract infection symptoms - Primary    Acute and starting recently, suspect UTI but will have patient come into office and obtain UA + wet prep, send for culture if needed.  Will also send CBC and CMP to assess further as has history of pyelonephritis and sepsis.  She will come in morning to obtain labs and then will determine next steps.  She agrees with this plan.      Relevant Orders   Urinalysis, Routine w reflex microscopic   WET PREP FOR TRICH, YEAST, CLUE   Comprehensive metabolic panel   CBC with Differential/Platelet   Urine Culture    I discussed the assessment and treatment plan with the patient. The patient was provided an opportunity to ask questions and all were answered. The patient agreed with the plan and demonstrated an understanding of the instructions.   The patient was advised to call back or seek an in-person evaluation if the symptoms worsen or if the condition fails to improve as  anticipated.   I provided 21+ minutes of time during this encounter.   Follow up plan: Return if symptoms worsen or fail to improve.

## 2021-04-20 NOTE — Progress Notes (Signed)
Appointment for labs have been made.

## 2021-04-20 NOTE — Patient Instructions (Signed)

## 2021-04-20 NOTE — Assessment & Plan Note (Signed)
Acute and starting recently, suspect UTI but will have patient come into office and obtain UA + wet prep, send for culture if needed.  Will also send CBC and CMP to assess further as has history of pyelonephritis and sepsis.  She will come in morning to obtain labs and then will determine next steps.  She agrees with this plan.

## 2021-04-21 ENCOUNTER — Other Ambulatory Visit: Payer: Medicaid Other

## 2021-04-21 ENCOUNTER — Encounter: Payer: Self-pay | Admitting: Nurse Practitioner

## 2021-04-21 ENCOUNTER — Other Ambulatory Visit: Payer: Self-pay

## 2021-04-21 DIAGNOSIS — R7989 Other specified abnormal findings of blood chemistry: Secondary | ICD-10-CM | POA: Diagnosis not present

## 2021-04-21 DIAGNOSIS — R197 Diarrhea, unspecified: Secondary | ICD-10-CM | POA: Diagnosis not present

## 2021-04-21 DIAGNOSIS — N819 Female genital prolapse, unspecified: Secondary | ICD-10-CM | POA: Diagnosis not present

## 2021-04-21 DIAGNOSIS — Z87891 Personal history of nicotine dependence: Secondary | ICD-10-CM | POA: Diagnosis not present

## 2021-04-21 DIAGNOSIS — N200 Calculus of kidney: Secondary | ICD-10-CM | POA: Diagnosis not present

## 2021-04-21 DIAGNOSIS — R110 Nausea: Secondary | ICD-10-CM | POA: Diagnosis not present

## 2021-04-21 DIAGNOSIS — R1030 Lower abdominal pain, unspecified: Secondary | ICD-10-CM | POA: Diagnosis not present

## 2021-04-21 DIAGNOSIS — R300 Dysuria: Secondary | ICD-10-CM | POA: Diagnosis not present

## 2021-04-21 DIAGNOSIS — Z87440 Personal history of urinary (tract) infections: Secondary | ICD-10-CM | POA: Diagnosis not present

## 2021-04-21 DIAGNOSIS — G43109 Migraine with aura, not intractable, without status migrainosus: Secondary | ICD-10-CM

## 2021-04-21 DIAGNOSIS — N12 Tubulo-interstitial nephritis, not specified as acute or chronic: Secondary | ICD-10-CM | POA: Diagnosis not present

## 2021-04-21 DIAGNOSIS — Z87442 Personal history of urinary calculi: Secondary | ICD-10-CM | POA: Diagnosis not present

## 2021-04-21 DIAGNOSIS — N8189 Other female genital prolapse: Secondary | ICD-10-CM | POA: Diagnosis not present

## 2021-04-21 DIAGNOSIS — I7300 Raynaud's syndrome without gangrene: Secondary | ICD-10-CM | POA: Diagnosis not present

## 2021-04-21 DIAGNOSIS — R399 Unspecified symptoms and signs involving the genitourinary system: Secondary | ICD-10-CM

## 2021-04-21 DIAGNOSIS — Z882 Allergy status to sulfonamides status: Secondary | ICD-10-CM | POA: Diagnosis not present

## 2021-04-21 DIAGNOSIS — F902 Attention-deficit hyperactivity disorder, combined type: Secondary | ICD-10-CM | POA: Diagnosis not present

## 2021-04-21 DIAGNOSIS — R339 Retention of urine, unspecified: Secondary | ICD-10-CM | POA: Diagnosis not present

## 2021-04-21 LAB — URINALYSIS, ROUTINE W REFLEX MICROSCOPIC
Bilirubin, UA: NEGATIVE
Glucose, UA: NEGATIVE
Ketones, UA: NEGATIVE
Nitrite, UA: POSITIVE — AB
Specific Gravity, UA: 1.01 (ref 1.005–1.030)
Urobilinogen, Ur: 0.2 mg/dL (ref 0.2–1.0)
pH, UA: 7 (ref 5.0–7.5)

## 2021-04-21 LAB — WET PREP FOR TRICH, YEAST, CLUE
Clue Cell Exam: NEGATIVE
Trichomonas Exam: NEGATIVE
Yeast Exam: NEGATIVE

## 2021-04-21 LAB — MICROSCOPIC EXAMINATION: RBC, Urine: >30 /hpf — ABNORMAL HIGH (ref 0–2)

## 2021-04-21 MED ORDER — NITROFURANTOIN MONOHYD MACRO 100 MG PO CAPS: 100.0000 mg | ORAL_CAPSULE | Freq: Two times a day (BID) | ORAL | 0 refills | Status: AC

## 2021-04-21 NOTE — Addendum Note (Signed)
Addended by: Aura Dials T on: 04/21/2021 12:11 PM   Modules accepted: Orders

## 2021-04-22 LAB — CBC WITH DIFFERENTIAL/PLATELET
Basophils Absolute: 0 10*3/uL (ref 0.0–0.2)
Basos: 0 %
EOS (ABSOLUTE): 0.1 10*3/uL (ref 0.0–0.4)
Eos: 1 %
Hematocrit: 41.9 % (ref 34.0–46.6)
Hemoglobin: 14.1 g/dL (ref 11.1–15.9)
Immature Grans (Abs): 0.1 10*3/uL (ref 0.0–0.1)
Immature Granulocytes: 1 %
Lymphocytes Absolute: 1.5 10*3/uL (ref 0.7–3.1)
Lymphs: 22 %
MCH: 32.6 pg (ref 26.6–33.0)
MCHC: 33.7 g/dL (ref 31.5–35.7)
MCV: 97 fL (ref 79–97)
Monocytes Absolute: 0.5 10*3/uL (ref 0.1–0.9)
Monocytes: 8 %
Neutrophils Absolute: 4.6 10*3/uL (ref 1.4–7.0)
Neutrophils: 68 %
Platelets: 327 10*3/uL (ref 150–450)
RBC: 4.32 x10E6/uL (ref 3.77–5.28)
RDW: 11.4 % — ABNORMAL LOW (ref 11.7–15.4)
WBC: 6.8 10*3/uL (ref 3.4–10.8)

## 2021-04-22 LAB — COMPREHENSIVE METABOLIC PANEL
ALT: 11 IU/L (ref 0–32)
AST: 16 IU/L (ref 0–40)
Albumin/Globulin Ratio: 1.7 (ref 1.2–2.2)
Albumin: 4.5 g/dL (ref 3.8–4.8)
Alkaline Phosphatase: 58 IU/L (ref 44–121)
BUN/Creatinine Ratio: 16 (ref 9–23)
BUN: 12 mg/dL (ref 6–20)
Bilirubin Total: 0.8 mg/dL (ref 0.0–1.2)
CO2: 24 mmol/L (ref 20–29)
Calcium: 9.4 mg/dL (ref 8.7–10.2)
Chloride: 96 mmol/L (ref 96–106)
Creatinine, Ser: 0.76 mg/dL (ref 0.57–1.00)
Globulin, Total: 2.6 g/dL (ref 1.5–4.5)
Glucose: 117 mg/dL — ABNORMAL HIGH (ref 70–99)
Potassium: 4.4 mmol/L (ref 3.5–5.2)
Sodium: 134 mmol/L (ref 134–144)
Total Protein: 7.1 g/dL (ref 6.0–8.5)
eGFR: 107 mL/min/{1.73_m2} (ref >59)

## 2021-04-22 LAB — TSH: TSH: 0.755 u[IU]/mL (ref 0.450–4.500)

## 2021-04-22 LAB — T4, FREE: Free T4: 1.52 ng/dL (ref 0.82–1.77)

## 2021-04-22 NOTE — Progress Notes (Signed)
Contacted via [place] evening Megan Bentley, your blood work in office is showing no elevation in white blood cell count which is good.  Kidney function, creatinine and eGFR, is normal with no signs of kidney disease.  Liver function, AST and ALT, is normal.  Thyroid levels normal this check.  Any questions? Keep being amazing!!  Thank you for allowing me to participate in your care.  I appreciate you. Kindest regards, Deborh Pense

## 2021-04-24 DIAGNOSIS — F329 Major depressive disorder, single episode, unspecified: Secondary | ICD-10-CM | POA: Diagnosis not present

## 2021-04-25 LAB — URINE CULTURE

## 2021-04-25 NOTE — Progress Notes (Signed)
Contacted via MyChart   Good morning [place], how are you feeling?  Your urine returned showing no growth, but I recommend you finish off the antibiotic due to symptoms and urinalysis findings.  I also recommend you call urology on Monday and schedule follow-up, as there is possibility you had kidney stones again.  I will see you at follow-up and recommend that we get you in with the same genetic specialist at [facility] your aunt and father saw for assessment in future -- which we can place referral for next visit.  Thanks. Keep being awesome!!  Thank you for allowing me to participate in your care.  I appreciate you. Kindest regards, Brit Carbonell

## 2021-04-26 LAB — DRUG SCREEN 764883 11+OXYCO+ALC+CRT-BUND
Amphetamines, Urine: NEGATIVE ng/mL
BENZODIAZ UR QL: NEGATIVE ng/mL
Barbiturate: NEGATIVE ng/mL
Cocaine (Metabolite): NEGATIVE ng/mL
Creatinine: 22.9 mg/dL (ref 20.0–300.0)
Ethanol: NEGATIVE %
Meperidine: NEGATIVE ng/mL
Methadone Screen, Urine: NEGATIVE ng/mL
OPIATE SCREEN URINE: NEGATIVE ng/mL
Oxycodone/Oxymorphone, Urine: NEGATIVE ng/mL
Phencyclidine: NEGATIVE ng/mL
Propoxyphene: NEGATIVE ng/mL
Tramadol: NEGATIVE ng/mL
pH, Urine: 6.8 (ref 4.5–8.9)

## 2021-04-26 LAB — CANNABINOID CONFIRMATION, UR
CANNABINOIDS: POSITIVE — AB
Carboxy THC GC/MS Conf: 23 ng/mL

## 2021-04-28 DIAGNOSIS — F329 Major depressive disorder, single episode, unspecified: Secondary | ICD-10-CM | POA: Diagnosis not present

## 2021-05-01 DIAGNOSIS — F329 Major depressive disorder, single episode, unspecified: Secondary | ICD-10-CM | POA: Diagnosis not present

## 2021-05-01 DIAGNOSIS — M545 Low back pain, unspecified: Secondary | ICD-10-CM | POA: Diagnosis not present

## 2021-05-05 ENCOUNTER — Ambulatory Visit: Payer: Self-pay

## 2021-05-05 ENCOUNTER — Other Ambulatory Visit: Payer: Self-pay | Admitting: Nurse Practitioner

## 2021-05-05 ENCOUNTER — Encounter: Payer: Self-pay | Admitting: Nurse Practitioner

## 2021-05-05 DIAGNOSIS — M545 Low back pain, unspecified: Secondary | ICD-10-CM

## 2021-05-05 DIAGNOSIS — R399 Unspecified symptoms and signs involving the genitourinary system: Secondary | ICD-10-CM

## 2021-05-05 NOTE — Telephone Encounter (Signed)
Pt called in stating she is having severe back pain in the RLQ. Reports 6-7/10 pain level. Doesn't radiate anywhere. Shes taking Tylenol, Ibuprofen, lidocaine patch, and heat/cold and nothing is helping. She is finished with the abx she was taking for kidney infection and said CT showed stones but unsure if this is stone related or not. Still reports slight pain with urination but not all the time. She doesn't want to be miserable for the holiday but doesn't know what else to do. Attempted to schedule appt but nothing available until 05/12/21. Pt comes in Monday 05/11/21 for labwork. Wants to know if [name] can call her in something or someone call her back with recommendations. Didn't want to see any other provider. Care advice given and pt verbalized understanding. No other questions/concerns noted.     Reason for Disposition  [1] SEVERE back pain (e.g., excruciating, unable to do any normal activities) AND [2] not improved 2 hours after pain medicine  Answer Assessment - Initial Assessment Questions 1. ONSET: "When did the pain begin?"      Sunday  2. LOCATION: "Where does it hurt?" (upper, mid or lower back)     R lower back 3. SEVERITY: "How bad is the pain?"  (e.g., Scale 1-10; mild, moderate, or severe)   - MILD (1-3): doesn't interfere with normal activities    - MODERATE (4-7): interferes with normal activities or awakens from sleep    - SEVERE (8-10): excruciating pain, unable to do any normal activities      6-7 4. PATTERN: "Is the pain constant?" (e.g., yes, no; constant, intermittent)      constant 5. RADIATION: "Does the pain shoot into your legs or elsewhere?"     No 6. CAUSE:  "What do you think is causing the back pain?"      Felt like kidney stones but no urinary symptoms 7. BACK OVERUSE:  "Any recent lifting of heavy objects, strenuous work or exercise?"     No 8. MEDICATIONS: "What have you taken so far for the pain?" (e.g., nothing, acetaminophen, NSAIDS)     Tylenol,  Ibuprofen, lidocaine patch, heat/cold 9. NEUROLOGIC SYMPTOMS: "Do you have any weakness, numbness, or problems with bowel/bladder control?"     Painful when she pees at times.  10. OTHER SYMPTOMS: "Do you have any other symptoms?" (e.g., fever, abdominal pain, burning with urination, blood in urine)       No 11. PREGNANCY: "Is there any chance you are pregnant?" (e.g., yes, no; LMP)       No  Protocols used: Back Pain-A-AH

## 2021-05-06 DIAGNOSIS — F329 Major depressive disorder, single episode, unspecified: Secondary | ICD-10-CM | POA: Diagnosis not present

## 2021-05-06 NOTE — Telephone Encounter (Signed)
See mychart messages from the patient, forwarded to provider for review and response.

## 2021-05-06 NOTE — Telephone Encounter (Signed)
Called pt and she stated that she can not come in for labs today and she is ok with waiting until Monday.  If you still want to discuss options with her at 4:20 today virtually she is fine with that; I can call to follow up with her.

## 2021-05-09 DIAGNOSIS — F329 Major depressive disorder, single episode, unspecified: Secondary | ICD-10-CM | POA: Diagnosis not present

## 2021-05-11 ENCOUNTER — Telehealth: Payer: Self-pay | Admitting: Oncology

## 2021-05-11 ENCOUNTER — Encounter: Payer: Self-pay | Admitting: Nurse Practitioner

## 2021-05-11 ENCOUNTER — Other Ambulatory Visit: Payer: Self-pay

## 2021-05-11 ENCOUNTER — Other Ambulatory Visit: Payer: Medicaid Other

## 2021-05-11 ENCOUNTER — Inpatient Hospital Stay: Payer: Medicaid Other

## 2021-05-11 ENCOUNTER — Ambulatory Visit (INDEPENDENT_AMBULATORY_CARE_PROVIDER_SITE_OTHER): Payer: Medicaid Other | Admitting: Nurse Practitioner

## 2021-05-11 VITALS — BP 84/63 | HR 94 | Wt 98.0 lb

## 2021-05-11 DIAGNOSIS — R399 Unspecified symptoms and signs involving the genitourinary system: Secondary | ICD-10-CM

## 2021-05-11 DIAGNOSIS — M545 Low back pain, unspecified: Secondary | ICD-10-CM | POA: Diagnosis not present

## 2021-05-11 DIAGNOSIS — F419 Anxiety disorder, unspecified: Secondary | ICD-10-CM | POA: Diagnosis not present

## 2021-05-11 DIAGNOSIS — F32A Depression, unspecified: Secondary | ICD-10-CM

## 2021-05-11 DIAGNOSIS — Z8489 Family history of other specified conditions: Secondary | ICD-10-CM | POA: Diagnosis not present

## 2021-05-11 MED ORDER — BACLOFEN 10 MG PO TABS: 10.0000 mg | ORAL_TABLET | Freq: Three times a day (TID) | ORAL | 4 refills | Status: DC

## 2021-05-11 MED ORDER — ONDANSETRON 4 MG PO TBDP: 4.0000 mg | ORAL_TABLET | Freq: Three times a day (TID) | ORAL | 0 refills | Status: DC | PRN

## 2021-05-11 NOTE — Progress Notes (Signed)
BP (!) 84/63   Pulse 94   Wt 98 lb (44.5 kg)   SpO2 98%   BMI 16.82 kg/m    Subjective:    Patient ID: Megan Bentley, female    DOB: [birth year], 35 y.o.   MRN: [id number]  HPI: Megan Bentley is a 35 y.o. female  Chief Complaint  Patient presents with   Urinary Retention    Patient states she is having to bend over to use the restroom and states she is straining to use the restroom to the point of giving herself a headache. Patient states after she uses the restroom, she states her bladder still feels full. Patient states she thinks she has a prolapse.    Medication Refill    Patient is requesting a refill on her Zofran.    URINARY SYMPTOMS Presents today for concern that she has urinary infection or bladder prolapse. Reports she is straining to go to the bathroom, which is causing her to have a headache.  Was to have a hysterectomy in April with Dr. Ouida Sills, but cancelled due to nerves -- she now feels she needs this procedure and has reached out to GYN.    Currently she is followed by GI, last seen on 04/06/21 + urology (Dr. Diamantina Providence) last seen 01/22/21.  Father has hereditary spastic paraplegia -- seen by Dr. [place] [facility] neuromuscular specialist and family has recommended she see them for testing.  She does endorse weakness in legs often and fatigue -- is going to PT.  Continues to have ongoing lower back issues. Dysuria: no Urinary frequency: no Urgency: no Small volume voids: yes Symptom severity: yes Urinary incontinence: no Foul odor: no Hematuria: no Abdominal pain: no Back pain: yes Suprapubic pain/pressure: no Flank pain: no Fever:  no Vomiting: no Status: stable Previous urinary tract infection: yes Sexual activity: monogamous History of sexually transmitted disease: no Treatments attempted: increasing fluids    DEPRESSION/ADHD Started on Vyvanse last visit for ADHD, as she is starting back to school soon.  Filled 04/10/21.  Continues on Klonopin  (last fill 05/08/21) as ordered by neurology, however we discussed at length last visit goal to work on discontinuing this over next few months due to having Vyvanse on board. She reports if she takes Vyvanse for school she would not take Klonopin anymore -- that is her goal.  She does endorse dealing with issues with her fiance's ex + step son.    She is also followed by neurology for migraines and continues on Pamelor and Gabapentin. Last visit on 03/11/21. Mood status: controlled Satisfied with current treatment?: yes Symptom severity: mild  Duration of current treatment : chronic Side effects: no Medication compliance: good compliance Psychotherapy/counseling: none Previous psychiatric medications: multiple medications Depressed mood: no Anxious mood: no Anhedonia: no Significant weight loss or gain: no Insomnia: none Fatigue: no Feelings of worthlessness or guilt: no Impaired concentration/indecisiveness: no Suicidal ideations: no Hopelessness: no Crying spells: no Depression screen [facility] 2/9 05/11/2021 06/26/2020 05/13/2020 04/10/2020  Decreased Interest 1 1 1 2   Down, Depressed, Hopeless 1 1 1 1   PHQ - 2 Score 2 2 2 3   Altered sleeping 0 1 1 1   Tired, decreased energy 1 1 1 3   Change in appetite 1 1 0 0  Feeling bad or failure about yourself  0 1 1 1   Trouble concentrating 0 0 3 2  Moving slowly or fidgety/restless 0 0 1 0  Suicidal thoughts 0 0 0 0  PHQ-9 Score  4 6 9 10   Difficult doing work/chores Somewhat difficult - Somewhat difficult Not difficult at all    GAD 7 : Generalized Anxiety Score 05/11/2021 05/13/2020 04/10/2020  Nervous, Anxious, on Edge 3 3 3   Control/stop worrying 3 3 2   Worry too much - different things 3 3 3   Trouble relaxing 3 3 3   Restless 2 1 3   Easily annoyed or irritable 3 1 3   Afraid - awful might happen 3 1 2   Total GAD 7 Score 20 15 19   Anxiety Difficulty Somewhat difficult Somewhat difficult Somewhat difficult    Relevant past medical,  surgical, family and social history reviewed and updated as indicated. Interim medical history since our last visit reviewed. Allergies and medications reviewed and updated.  Review of Systems  Constitutional:  Negative for activity change, appetite change, diaphoresis, fatigue and fever.  Respiratory:  Negative for cough, chest tightness and shortness of breath.   Cardiovascular:  Negative for chest pain, palpitations and leg swelling.  Gastrointestinal:  Positive for nausea. Negative for abdominal distention, abdominal pain, blood in stool, constipation, diarrhea and vomiting.  Genitourinary:  Positive for decreased urine volume. Negative for dysuria, frequency, hematuria, pelvic pain, urgency and vaginal discharge.  Neurological: Negative.   Psychiatric/Behavioral: Negative.     Per HPI unless specifically indicated above     Objective:    BP (!) 84/63   Pulse 94   Wt 98 lb (44.5 kg)   SpO2 98%   BMI 16.82 kg/m   Wt Readings from Last 3 Encounters:  05/11/21 98 lb (44.5 kg)  04/18/21 98 lb (44.5 kg)  04/10/21 98 lb 6.4 oz (44.6 kg)    Physical Exam Vitals and nursing note reviewed.  Constitutional:      General: She is awake. She is not in acute distress.    Appearance: She is well-developed and well-groomed. She is not ill-appearing or toxic-appearing.  HENT:     Head: Normocephalic.     Right Ear: Hearing, tympanic membrane, ear canal and external ear normal.     Left Ear: Hearing, tympanic membrane, ear canal and external ear normal.  Eyes:     General: Lids are normal.        Right eye: No discharge.        Left eye: No discharge.     Extraocular Movements: Extraocular movements intact.     Conjunctiva/sclera: Conjunctivae normal.     Pupils: Pupils are equal, round, and reactive to light.     Visual Fields: Right eye visual fields normal and left eye visual fields normal.  Neck:     Thyroid: No thyromegaly.     Vascular: No carotid bruit or JVD.   Cardiovascular:     Rate and Rhythm: Normal rate and regular rhythm.     Heart sounds: Normal heart sounds. No murmur heard.   No gallop.  Pulmonary:     Effort: Pulmonary effort is normal.     Breath sounds: Normal breath sounds.  Abdominal:     General: Bowel sounds are normal.     Palpations: Abdomen is soft.  Musculoskeletal:     Cervical back: Normal range of motion and neck supple.     Right lower leg: No edema.     Left lower leg: No edema.  Lymphadenopathy:     Cervical: No cervical adenopathy.  Skin:    General: Skin is warm and dry.  Neurological:     Mental Status: She is alert and oriented to person,  place, and time.     Sensory: Sensation is intact.     Motor: Motor function is intact.     Coordination: Coordination is intact.     Gait: Gait is intact.     Deep Tendon Reflexes: Reflexes are normal and symmetric.     Reflex Scores:      Brachioradialis reflexes are 2+ on the right side and 2+ on the left side.      Patellar reflexes are 2+ on the right side and 2+ on the left side. Psychiatric:        Attention and Perception: Attention normal.        Mood and Affect: Mood normal.        Speech: Speech normal.        Behavior: Behavior normal. Behavior is cooperative.        Thought Content: Thought content normal.    Results for orders placed or performed in visit on 04/21/21  Urine Culture   Specimen: Urine   UR  Result Value Ref Range   Urine Culture, Routine Final report    Organism ID, Bacteria Comment   WET PREP FOR TRICH, YEAST, CLUE   Specimen: Sterile Swab   Sterile Swab  Result Value Ref Range   Trichomonas Exam Negative Negative   Yeast Exam Negative Negative   Clue Cell Exam Negative Negative  Microscopic Examination   Urine  Result Value Ref Range   WBC, UA 0-5 0 - 5 /hpf   RBC >30 (H) 0 - 2 /hpf   Epithelial Cells (non renal) 0-10 0 - 10 /hpf   Mucus, UA Present (A) Not Estab.   Bacteria, UA Many (A) None seen/Few  CBC with  Differential/Platelet  Result Value Ref Range   WBC 6.8 3.4 - 10.8 x10E3/uL   RBC 4.32 3.77 - 5.28 x10E6/uL   Hemoglobin 14.1 11.1 - 15.9 g/dL   Hematocrit 41.9 34.0 - 46.6 %   MCV 97 79 - 97 fL   MCH 32.6 26.6 - 33.0 pg   MCHC 33.7 31.5 - 35.7 g/dL   RDW 11.4 (L) 11.7 - 15.4 %   Platelets 327 150 - 450 x10E3/uL   Neutrophils 68 Not Estab. %   Lymphs 22 Not Estab. %   Monocytes 8 Not Estab. %   Eos 1 Not Estab. %   Basos 0 Not Estab. %   Neutrophils Absolute 4.6 1.4 - 7.0 x10E3/uL   Lymphocytes Absolute 1.5 0.7 - 3.1 x10E3/uL   Monocytes Absolute 0.5 0.1 - 0.9 x10E3/uL   EOS (ABSOLUTE) 0.1 0.0 - 0.4 x10E3/uL   Basophils Absolute 0.0 0.0 - 0.2 x10E3/uL   Immature Granulocytes 1 Not Estab. %   Immature Grans (Abs) 0.1 0.0 - 0.1 x10E3/uL  Comprehensive metabolic panel  Result Value Ref Range   Glucose 117 (H) 70 - 99 mg/dL   BUN 12 6 - 20 mg/dL   Creatinine, Ser 0.76 0.57 - 1.00 mg/dL   eGFR 107 >59 mL/min/1.73   BUN/Creatinine Ratio 16 9 - 23   Sodium 134 134 - 144 mmol/L   Potassium 4.4 3.5 - 5.2 mmol/L   Chloride 96 96 - 106 mmol/L   CO2 24 20 - 29 mmol/L   Calcium 9.4 8.7 - 10.2 mg/dL   Total Protein 7.1 6.0 - 8.5 g/dL   Albumin 4.5 3.8 - 4.8 g/dL   Globulin, Total 2.6 1.5 - 4.5 g/dL   Albumin/Globulin Ratio 1.7 1.2 - 2.2   Bilirubin Total 0.8  0.0 - 1.2 mg/dL   Alkaline Phosphatase 58 44 - 121 IU/L   AST 16 0 - 40 IU/L   ALT 11 0 - 32 IU/L  Urinalysis, Routine w reflex microscopic  Result Value Ref Range   Specific Gravity, UA 1.010 1.005 - 1.030   pH, UA 7.0 5.0 - 7.5   Color, UA Red (A) Yellow   Appearance Ur Clear Clear   Leukocytes,UA Trace (A) Negative   Protein,UA 3+ (A) Negative/Trace   Glucose, UA Negative Negative   Ketones, UA Negative Negative   RBC, UA 3+ (A) Negative   Bilirubin, UA Negative Negative   Urobilinogen, Ur 0.2 0.2 - 1.0 mg/dL   Nitrite, UA Positive (A) Negative   Microscopic Examination See below:   [id number] 11+Oxyco+Alc+Crt-Bund   Result Value Ref Range   Ethanol Negative Cutoff=0.020 %   Amphetamines, Urine Negative Cutoff=1000 ng/mL   Barbiturate Negative Cutoff=200 ng/mL   BENZODIAZ UR QL Negative Cutoff=200 ng/mL   Cannabinoid Quant, Ur See Final Results Cutoff=50 ng/mL   Cocaine (Metabolite) Negative Cutoff=300 ng/mL   OPIATE SCREEN URINE Negative Cutoff=300 ng/mL   Oxycodone/Oxymorphone, Urine Negative Cutoff=300 ng/mL   Phencyclidine Negative Cutoff=25 ng/mL   Methadone Screen, Urine Negative Cutoff=300 ng/mL   Propoxyphene Negative Cutoff=300 ng/mL   Meperidine Negative Cutoff=200 ng/mL   Tramadol Negative Cutoff=200 ng/mL   Creatinine 22.9 20.0 - 300.0 mg/dL   pH, Urine 6.8 4.5 - 8.9  TSH  Result Value Ref Range   TSH 0.755 0.450 - 4.500 uIU/mL  T4, free  Result Value Ref Range   Free T4 1.52 0.82 - 1.77 ng/dL  Cannabinoid Conf, Ur  Result Value Ref Range   CANNABINOIDS Positive (A) Cutoff=50   Carboxy THC GC/MS Conf 23 Cutoff=15 ng/mL      Assessment & Plan:   Problem List Items Addressed This Visit       Other   Anxiety and depression    Chronic, ongoing, followed by neurology with Klonopin ordered.  She is aware of long term risks of chronic benzo use and aware this script needs to come from neurology, as they initiated this treatment.  At this time discussed with her need to reduce to discontinue this script as she wishes to continue on medication for her ADHD.  A verbal agreement was made to work on this reduction to discontinuation while initiating Vyvanse.  Controlled substance contract and UDS up to date -- had at length discussion with her about no MJ use.      Urinary tract infection symptoms - Primary    UA clear today and wet prep negative.  Suspect much is coming from her current prolapse.  She has reached out to GYN to request surgical intervention, which she cancelled beginning of this year.      Other Visit Diagnoses     Family history of genetic disorder       Referral  to provider in neurology at [place] who assisted in diagnosing her aunt and father with hereditary spastic paraplegia.   Relevant Orders   Ambulatory referral to Neurology        Follow up plan: Return in about 8 weeks (around 07/06/2021) for Weakness and urine check.

## 2021-05-11 NOTE — Assessment & Plan Note (Addendum)
Chronic, ongoing, followed by neurology with Klonopin ordered.  She is aware of long term risks of chronic benzo use and aware this script needs to come from neurology, as they initiated this treatment.  At this time discussed with her need to reduce to discontinue this script as she wishes to continue on medication for her ADHD.  A verbal agreement was made to work on this reduction to discontinuation while initiating Vyvanse.  Controlled substance contract and UDS up to date -- had at length discussion with her about no MJ use.

## 2021-05-11 NOTE — Patient Instructions (Signed)
Acute Urinary Retention, Female ?Acute urinary retention is a condition in which a person is unable to pass urine or can only pass a little urine. This condition can happen suddenly and last for a short time. If left untreated, it can become long-term (chronic) and result in kidney damage or other serious complications. ?What are the causes? ?This condition may be caused by: ?Obstruction or narrowing of the tube that drains the bladder (urethra). This may be caused by surgery, problems with nearby organs, or injury to the bladder or urethra. ?Problems with the nerves in the bladder. ?Pelvic organ prolapse. ?Tumors in the area of the pelvis, bladder, or urethra. ?Vaginal childbirth. ?Bladder or urinary tract infection. ?Constipation. ?Certain medicines. ?What increases the risk? ?This condition is more likely to develop in women over age 33. Other chronic health conditions can increase the risk of acute urinary retention. These include: ?Diseases such as multiple sclerosis. ?Spinal cord injuries. ?Diabetes. ?Degenerative cognitive conditions, such as delirium or dementia. ?Psychological conditions. A woman may hold her urine due to trauma or because she does not want to use the bathroom. ?History of preexisting urinary retention. ?History of prior pelvic surgery, incontinence surgery, or radical pelvic surgery. ?What are the signs or symptoms? ?Symptoms of this condition include: ?Trouble urinating. ?Pain in the lower abdomen. ?How is this diagnosed? ?This condition is diagnosed based on a physical exam and your medical history. You may also have other tests, including: ?An ultrasound of the bladder or kidneys or both. ?Blood tests. ?A urine analysis. ?Additional tests may be needed, such as a CT scan, MRI, and kidney or bladder function tests. ?How is this treated? ?Treatment for this condition may include: ?Medicines. ?Placing a thin, sterile tube (catheter) into the bladder to drain urine out of the body. This is  called an indwelling urinary catheter. After it is inserted, the catheter is held in place with a small balloon that is filled with sterile water. Urine drains from the catheter into a collection bag outside of the body. ?Behavioral therapy. ?Treatment for other conditions. ?If needed, you may be treated in the hospital for kidney function problems or to manage other complications. ?Follow these instructions at home: ?Medicines ?Take over-the-counter and prescription medicines only as told by your health care provider. Avoid certain medicines, such as decongestants, antihistamines, and some prescription medicines. Do not take any medicine unless your health care provider approves. ?If you were prescribed an antibiotic medicine, take it as told by your health care provider. Do not stop using the antibiotic even if you start to feel better. ?General instructions ?Do not use any products that contain nicotine or tobacco. These products include cigarettes, chewing tobacco, and vaping devices, such as e-cigarettes. If you need help quitting, ask your health care provider. ?Drink enough fluid to keep your urine pale yellow. ?If you have an indwelling urinary catheter, follow the instructions from your health care provider. ?Monitor any changes in your symptoms. Tell your health care provider about any changes. ?If instructed, monitor your blood pressure at home. Report changes as told by your health care provider. ?Keep all follow-up visits. This is important. ?Contact a health care provider if: ?You have uncomfortable bladder contractions that you cannot control (spasms). ?You leak urine with the spasms. ?Get help right away if: ?You have chills or a fever. ?You have blood in your urine. ?You have a catheter and the following happens: ?Your catheter stops draining urine. ?Your catheter falls out. ?Summary ?Acute urinary retention is a condition   in which a person is unable to pass urine or can only pass a little urine. If  left untreated, this can result in kidney damage or other serious complications. ?One cause of this condition may be obstruction or narrowing of the tube that drains the bladder (urethra). This may be caused by surgery, problems with nearby organs, or injury to the bladder or urethra. ?Treatment may include medicines and placement of an indwelling urinary catheter. ?Monitor any changes in your symptoms. Tell your health care provider about any changes. ?This information is not intended to replace advice given to you by your health care provider. Make sure you discuss any questions you have with your health care provider. ?Document Revised: 02/20/2020 Document Reviewed: 02/20/2020 ?Elsevier Patient Education ? 2022 [employer]vier [employer] ? ?

## 2021-05-11 NOTE — Assessment & Plan Note (Signed)
UA clear today and wet prep negative.  Suspect much is coming from her current prolapse.  She has reached out to GYN to request surgical intervention, which she cancelled beginning of this year.

## 2021-05-11 NOTE — Telephone Encounter (Signed)
Pt called to reschedule her appt. Please call back at [phone]

## 2021-05-12 ENCOUNTER — Inpatient Hospital Stay: Payer: Medicaid Other | Admitting: Oncology

## 2021-05-12 DIAGNOSIS — F329 Major depressive disorder, single episode, unspecified: Secondary | ICD-10-CM | POA: Diagnosis not present

## 2021-05-12 LAB — COMPREHENSIVE METABOLIC PANEL
ALT: 7 IU/L (ref 0–32)
AST: 12 IU/L (ref 0–40)
Albumin/Globulin Ratio: 2.8 — ABNORMAL HIGH (ref 1.2–2.2)
Albumin: 5 g/dL — ABNORMAL HIGH (ref 3.8–4.8)
Alkaline Phosphatase: 55 IU/L (ref 44–121)
BUN/Creatinine Ratio: 15 (ref 9–23)
BUN: 10 mg/dL (ref 6–20)
Bilirubin Total: 0.6 mg/dL (ref 0.0–1.2)
CO2: 21 mmol/L (ref 20–29)
Calcium: 9.5 mg/dL (ref 8.7–10.2)
Chloride: 101 mmol/L (ref 96–106)
Creatinine, Ser: 0.66 mg/dL (ref 0.57–1.00)
Globulin, Total: 1.8 g/dL (ref 1.5–4.5)
Glucose: 82 mg/dL (ref 70–99)
Potassium: 4.3 mmol/L (ref 3.5–5.2)
Sodium: 136 mmol/L (ref 134–144)
Total Protein: 6.8 g/dL (ref 6.0–8.5)
eGFR: 119 mL/min/{1.73_m2} (ref >59)

## 2021-05-12 LAB — CBC WITH DIFFERENTIAL/PLATELET
Basophils Absolute: 0 10*3/uL (ref 0.0–0.2)
Basos: 1 %
EOS (ABSOLUTE): 0.1 10*3/uL (ref 0.0–0.4)
Eos: 2 %
Hematocrit: 39.7 % (ref 34.0–46.6)
Hemoglobin: 13.3 g/dL (ref 11.1–15.9)
Immature Grans (Abs): 0 10*3/uL (ref 0.0–0.1)
Immature Granulocytes: 0 %
Lymphocytes Absolute: 1.5 10*3/uL (ref 0.7–3.1)
Lymphs: 27 %
MCH: 31.8 pg (ref 26.6–33.0)
MCHC: 33.5 g/dL (ref 31.5–35.7)
MCV: 95 fL (ref 79–97)
Monocytes Absolute: 0.4 10*3/uL (ref 0.1–0.9)
Monocytes: 7 %
Neutrophils Absolute: 3.6 10*3/uL (ref 1.4–7.0)
Neutrophils: 63 %
Platelets: 226 10*3/uL (ref 150–450)
RBC: 4.18 x10E6/uL (ref 3.77–5.28)
RDW: 12.7 % (ref 11.7–15.4)
WBC: 5.7 10*3/uL (ref 3.4–10.8)

## 2021-05-12 LAB — URINALYSIS, ROUTINE W REFLEX MICROSCOPIC
Bilirubin, UA: NEGATIVE
Glucose, UA: NEGATIVE
Ketones, UA: NEGATIVE
Leukocytes,UA: NEGATIVE
Nitrite, UA: NEGATIVE
Protein,UA: NEGATIVE
RBC, UA: NEGATIVE
Specific Gravity, UA: <1.005 — ABNORMAL LOW (ref 1.005–1.030)
Urobilinogen, Ur: 0.2 mg/dL (ref 0.2–1.0)
pH, UA: 5.5 (ref 5.0–7.5)

## 2021-05-12 LAB — WET PREP FOR TRICH, YEAST, CLUE
Clue Cell Exam: NEGATIVE
Trichomonas Exam: NEGATIVE
Yeast Exam: NEGATIVE

## 2021-05-12 NOTE — Progress Notes (Signed)
Contacted via [place] morning Megan Bentley, your labs have returned and CBC is normal + kidney function, creatinine and eGFR, is normal!!  [employer].  Liver function, AST and ALT, also normal.  Any questions? Keep being awesome!!  Thank you for allowing me to participate in your care.  I appreciate you. Kindest regards, Alesana Magistro

## 2021-05-12 NOTE — Telephone Encounter (Signed)
Please schedule her an appt to discuss CT further, too much to try and address via Megan Musca, MD 05/12/2021

## 2021-05-13 ENCOUNTER — Encounter: Payer: Self-pay | Admitting: Urology

## 2021-05-13 ENCOUNTER — Ambulatory Visit (INDEPENDENT_AMBULATORY_CARE_PROVIDER_SITE_OTHER): Payer: Medicaid Other | Admitting: Urology

## 2021-05-13 ENCOUNTER — Other Ambulatory Visit: Payer: Self-pay

## 2021-05-13 ENCOUNTER — Ambulatory Visit: Payer: Medicaid Other | Admitting: Physician Assistant

## 2021-05-13 VITALS — Ht 63.0 in | Wt 90.9 lb

## 2021-05-13 DIAGNOSIS — N200 Calculus of kidney: Secondary | ICD-10-CM | POA: Diagnosis not present

## 2021-05-13 DIAGNOSIS — N390 Urinary tract infection, site not specified: Secondary | ICD-10-CM

## 2021-05-13 DIAGNOSIS — M545 Low back pain, unspecified: Secondary | ICD-10-CM | POA: Diagnosis not present

## 2021-05-13 NOTE — Progress Notes (Signed)
   05/13/2021 12:56 PM   Megan Bentley 09/04/1985 [id number]  Reason for visit: Follow up abdominal pain, nephrolithiasis, UTIs  HPI: 35 year old female with anxiety and a number of other comorbidities who presents to review her most recent CT scan from [facility].  I last saw her in August 2022 after being admitted for UTI/sepsis, and recommended cranberry tablets and low-dose nitrofurantoin for 3 months for her recurrent UTIs.  PVR was normal at 9 mL at that time, and CT from 01/21/2021 was normal aside from some very small nonobstructing right renal stones.  She has not had any culture documented infections since that time.  She was seen at [facility] on 04/21/2021 for abdominal pain and CT abdomen and pelvis with contrast showed no hydronephrosis, and a single punctate 2 mm right renal stone.  There was a possible striated right-sided nephrogram but this was quite subtle, and there were no other renal lesions.  I personally viewed and interpreted those images.  Urinalysis at that time was contaminated with squamous cells, and ultimately grew 10-50k coag negative staph, likely representing skin contaminant.  She was having some microscopic hematuria, but was also on her period at that time.  She also has a known cystocele, and had previously been scheduled to undergo hysterectomy and repair with Dr. Feliberto Gottron and gynecology but canceled this procedure in the spring 2022.  Her primary complaint today is feeling of incomplete bladder emptying and intermittent abdominal pain.  Her PVR is 0 mL today.  I encouraged her to re-establish care with Dr. Feliberto Gottron to consider hysterectomy and cystocele repair.  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  Finally, I recommended a PTH today to evaluate for hyperparathyroidism with her history of recurrent stone disease.  We will  call with PTH results.  I also encouraged her to continue cranberry tablets for UTI prophylaxis.  I feel strongly that her abdominal and flank pain is not related to her 2 mm nonobstructive right renal stone, and did not recommend intervention.   I spent 30  total minutes on the day of the encounter including pre-visit review of the medical record, face-to-face time with the patient, and post visit ordering of labs/imaging/tests.  Megan Come, MD  [facility] [address] [place], [place] [address] [phone]

## 2021-05-14 ENCOUNTER — Telehealth: Payer: Self-pay

## 2021-05-14 DIAGNOSIS — Z419 Encounter for procedure for purposes other than remedying health state, unspecified: Secondary | ICD-10-CM | POA: Diagnosis not present

## 2021-05-14 DIAGNOSIS — F329 Major depressive disorder, single episode, unspecified: Secondary | ICD-10-CM | POA: Diagnosis not present

## 2021-05-14 LAB — PTH, INTACT AND CALCIUM
Calcium: 9.5 mg/dL (ref 8.7–10.2)
PTH: 57 pg/mL (ref 15–65)

## 2021-05-14 NOTE — Telephone Encounter (Signed)
-----   Message from Sondra Come, MD sent at 05/14/2021 12:53 PM EST ----- PTH was normal, this means she does not have hyperparathyroidism. Recommend moving follow up with Sam from December to June, thanks  Legrand Rams, MD 05/14/2021

## 2021-05-19 ENCOUNTER — Ambulatory Visit (INDEPENDENT_AMBULATORY_CARE_PROVIDER_SITE_OTHER): Payer: Medicaid Other | Admitting: Nurse Practitioner

## 2021-05-19 ENCOUNTER — Encounter: Payer: Self-pay | Admitting: Nurse Practitioner

## 2021-05-19 ENCOUNTER — Inpatient Hospital Stay: Payer: Medicaid Other | Attending: Oncology | Admitting: Oncology

## 2021-05-19 ENCOUNTER — Other Ambulatory Visit: Payer: Self-pay

## 2021-05-19 VITALS — BP 106/76 | HR 94 | Wt 90.0 lb

## 2021-05-19 DIAGNOSIS — F419 Anxiety disorder, unspecified: Secondary | ICD-10-CM

## 2021-05-19 DIAGNOSIS — F329 Major depressive disorder, single episode, unspecified: Secondary | ICD-10-CM | POA: Diagnosis not present

## 2021-05-19 DIAGNOSIS — F32A Depression, unspecified: Secondary | ICD-10-CM | POA: Diagnosis not present

## 2021-05-19 DIAGNOSIS — D509 Iron deficiency anemia, unspecified: Secondary | ICD-10-CM

## 2021-05-19 DIAGNOSIS — F902 Attention-deficit hyperactivity disorder, combined type: Secondary | ICD-10-CM | POA: Diagnosis not present

## 2021-05-19 NOTE — Assessment & Plan Note (Signed)
Chronic, ongoing, followed by neurology with Klonopin ordered.  She is aware of long term risks of chronic benzo use and aware this script needs to come from neurology, as they initiated this treatment.  A verbal agreement was made to work on this reduction to discontinuation while continuing Vyvanse.  Controlled substance contract and UDS up to date -- had at length discussion with her about no MJ use.  Discussed with her current Medicaid lock-in and recommend she reach out to customer service and work on appeal, as patient sees specialists for other medications.

## 2021-05-19 NOTE — Assessment & Plan Note (Signed)
Improved with medication.  Have discussed at length with her risks with use of Klonopin and Vyvanse -- at this time discussed with her need to reduce to discontinue this script (Klonopin) slow reduction and maintain Vyvanse.  A verbal agreement was made to work on this reduction to discontinuation while initiating Vyvanse.  Controlled substance contract and UDS up to date.  She is to return in 3 months for follow-up -- will monitor Klonopin use with goal of discontinuation of this.

## 2021-05-19 NOTE — Patient Instructions (Signed)

## 2021-05-19 NOTE — Progress Notes (Signed)
BP 106/76   Pulse 94   Wt 90 lb (40.8 kg)   SpO2 96%   BMI 15.94 kg/m    Subjective:    Patient ID: Megan Bentley, female    DOB: 06/10/1986, 35 y.o.   MRN: [id number]  HPI: Megan Bentley is a 35 y.o. female  Chief Complaint  Patient presents with   Medication Management    Patient states she received a letter in the mail from her insurance stating she can only can receive her prescriptions from her primary care provider and not have them prescribed from a different providers. Patient states she has the letter and would like to discuss with provider. Patient states she is only allowed to get her anxiety, pain and etc medications are not covered unlike Jeri Rawlins prescribed them.    DEPRESSION/ADHD She presents today for concerns of medication lock in program through [place] where it is stating she can only have one provider prescribing controlled substances.  Lock in for two years she reports.  She is very anxious about this.  Taking Vyvanse for ADHD, as she is starting back to school soon. Filled 05/13/21.    Continues on Klonopin (last fill 05/08/21) as ordered by neurology, however we discussed at length last visit goal to work on discontinuing this over next few months due to having Vyvanse on board. She reports if she takes Vyvanse for school she would not take Klonopin anymore -- that is her goal.  She does endorse dealing with issues with her fiance's ex + step son.     She is also followed by neurology for migraines and continues on Pamelor and Gabapentin. Last visit on 03/11/21. Mood status: controlled Satisfied with current treatment?: yes Symptom severity: mild  Duration of current treatment : chronic Side effects: no Medication compliance: good compliance Psychotherapy/counseling: none Previous psychiatric medications: multiple medications Depressed mood: no Anxious mood: no Anhedonia: no Significant weight loss or gain: no Insomnia: none Fatigue: no Feelings of  worthlessness or guilt: no Impaired concentration/indecisiveness: no Suicidal ideations: no Hopelessness: no Crying spells: no Depression screen [facility] 2/9 05/11/2021 06/26/2020 05/13/2020 04/10/2020  Decreased Interest 1 1 1 2   Down, Depressed, Hopeless 1 1 1 1   PHQ - 2 Score 2 2 2 3   Altered sleeping 0 1 1 1   Tired, decreased energy 1 1 1 3   Change in appetite 1 1 0 0  Feeling bad or failure about yourself  0 1 1 1   Trouble concentrating 0 0 3 2  Moving slowly or fidgety/restless 0 0 1 0  Suicidal thoughts 0 0 0 0  PHQ-9 Score 4 6 9 10   Difficult doing work/chores Somewhat difficult - Somewhat difficult Not difficult at all    GAD 7 : Generalized Anxiety Score 05/11/2021 05/13/2020 04/10/2020  Nervous, Anxious, on Edge 3 3 3   Control/stop worrying 3 3 2   Worry too much - different things 3 3 3   Trouble relaxing 3 3 3   Restless 2 1 3   Easily annoyed or irritable 3 1 3   Afraid - awful might happen 3 1 2   Total GAD 7 Score 20 15 19   Anxiety Difficulty Somewhat difficult Somewhat difficult Somewhat difficult      Relevant past medical, surgical, family and social history reviewed and updated as indicated. Interim medical history since our last visit reviewed. Allergies and medications reviewed and updated.  Review of Systems  Constitutional:  Negative for activity change, appetite change, diaphoresis, fatigue and fever.  Respiratory:  Negative for cough, chest tightness and shortness of breath.   Cardiovascular:  Negative for chest pain, palpitations and leg swelling.  Gastrointestinal: Negative.   Neurological: Negative.   Psychiatric/Behavioral:  Positive for decreased concentration. Negative for self-injury, sleep disturbance and suicidal ideas. The patient is nervous/anxious.    Per HPI unless specifically indicated above     Objective:    BP 106/76   Pulse 94   Wt 90 lb (40.8 kg)   SpO2 96%   BMI 15.94 kg/m   Wt Readings from Last 3 Encounters:  05/19/21 90 lb  (40.8 kg)  05/13/21 90 lb 14.4 oz (41.2 kg)  05/11/21 98 lb (44.5 kg)    Physical Exam Vitals and nursing note reviewed.  Constitutional:      General: She is awake. She is not in acute distress.    Appearance: She is well-developed and well-groomed. She is not ill-appearing or toxic-appearing.  HENT:     Head: Normocephalic.     Right Ear: Hearing, tympanic membrane, ear canal and external ear normal.     Left Ear: Hearing, tympanic membrane, ear canal and external ear normal.  Eyes:     General: Lids are normal.        Right eye: No discharge.        Left eye: No discharge.     Extraocular Movements: Extraocular movements intact.     Conjunctiva/sclera: Conjunctivae normal.     Pupils: Pupils are equal, round, and reactive to light.     Visual Fields: Right eye visual fields normal and left eye visual fields normal.  Neck:     Thyroid: No thyromegaly.     Vascular: No carotid bruit or JVD.  Cardiovascular:     Rate and Rhythm: Normal rate and regular rhythm.     Heart sounds: Normal heart sounds. No murmur heard.   No gallop.  Pulmonary:     Effort: Pulmonary effort is normal.     Breath sounds: Normal breath sounds.  Abdominal:     General: Bowel sounds are normal.     Palpations: Abdomen is soft.  Musculoskeletal:     Cervical back: Normal range of motion and neck supple.     Right lower leg: No edema.     Left lower leg: No edema.  Lymphadenopathy:     Cervical: No cervical adenopathy.  Skin:    General: Skin is warm and dry.  Neurological:     Mental Status: She is alert and oriented to person, place, and time.     Sensory: Sensation is intact.     Motor: Motor function is intact.     Coordination: Coordination is intact.     Gait: Gait is intact.     Deep Tendon Reflexes: Reflexes are normal and symmetric.     Reflex Scores:      Brachioradialis reflexes are 2+ on the right side and 2+ on the left side.      Patellar reflexes are 2+ on the right side and 2+  on the left side. Psychiatric:        Attention and Perception: Attention normal.        Mood and Affect: Mood normal.        Speech: Speech normal.        Behavior: Behavior normal. Behavior is cooperative.        Thought Content: Thought content normal.    Results for orders placed or performed in visit on 05/13/21  PTH, Intact  and Calcium  Result Value Ref Range   Calcium 9.5 8.7 - 10.2 mg/dL   PTH 57 15 - 65 pg/mL   PTH Interp Comment       Assessment & Plan:   Problem List Items Addressed This Visit       Other   ADHD - Primary    Improved with medication.  Have discussed at length with her risks with use of Klonopin and Vyvanse -- at this time discussed with her need to reduce to discontinue this script (Klonopin) slow reduction and maintain Vyvanse.  A verbal agreement was made to work on this reduction to discontinuation while initiating Vyvanse.  Controlled substance contract and UDS up to date.  She is to return in 3 months for follow-up -- will monitor Klonopin use with goal of discontinuation of this.        Anxiety and depression    Chronic, ongoing, followed by neurology with Klonopin ordered.  She is aware of long term risks of chronic benzo use and aware this script needs to come from neurology, as they initiated this treatment.  A verbal agreement was made to work on this reduction to discontinuation while continuing Vyvanse.  Controlled substance contract and UDS up to date -- had at length discussion with her about no MJ use.  Discussed with her current Medicaid lock-in and recommend she reach out to customer service and work on appeal, as patient sees specialists for other medications.        Follow up plan: Return if symptoms worsen or fail to improve.

## 2021-05-20 ENCOUNTER — Encounter: Payer: Self-pay | Admitting: Nurse Practitioner

## 2021-05-22 DIAGNOSIS — F329 Major depressive disorder, single episode, unspecified: Secondary | ICD-10-CM | POA: Diagnosis not present

## 2021-05-27 DIAGNOSIS — F329 Major depressive disorder, single episode, unspecified: Secondary | ICD-10-CM | POA: Diagnosis not present

## 2021-05-28 DIAGNOSIS — N8111 Cystocele, midline: Secondary | ICD-10-CM | POA: Diagnosis not present

## 2021-05-28 DIAGNOSIS — N3281 Overactive bladder: Secondary | ICD-10-CM | POA: Diagnosis not present

## 2021-05-28 DIAGNOSIS — N814 Uterovaginal prolapse, unspecified: Secondary | ICD-10-CM | POA: Diagnosis not present

## 2021-05-28 DIAGNOSIS — N816 Rectocele: Secondary | ICD-10-CM | POA: Diagnosis not present

## 2021-05-29 DIAGNOSIS — F329 Major depressive disorder, single episode, unspecified: Secondary | ICD-10-CM | POA: Diagnosis not present

## 2021-06-05 DIAGNOSIS — F329 Major depressive disorder, single episode, unspecified: Secondary | ICD-10-CM | POA: Diagnosis not present

## 2021-06-09 ENCOUNTER — Ambulatory Visit: Payer: Self-pay | Admitting: Gastroenterology

## 2021-06-09 ENCOUNTER — Ambulatory Visit: Payer: Medicaid Other | Admitting: Gastroenterology

## 2021-06-09 NOTE — Progress Notes (Deleted)
Gastroenterology Consultation  Referring Provider:     Marjie Skiff, NP Primary Care Physician:  Marjie Skiff, NP Primary Gastroenterologist:  Dr. Servando Snare     Reason for Consultation:     Unintended weight loss        HPI:   Megan Bentley is a 35 y.o. y/o female referred for consultation & management of unintended weight loss by Dr. Harvest Dark, Dorie Rank, NP.  This patient comes to see me after she was seen by the [facility] GI department for loss of appetite, early satiety and unintentional weight loss.  The patient had reported to them that this has been going on for over 2 years and reporting that her "taste buds were not working right".  It was reported that she had lost weight and stated that she had started at around 115 and had gone down to 98 pounds at the time of her last GI visit.  She had reported to them that "she had no desire for food".  There is report that when she puts food in her mouth she gets sick to her stomach and feels like she is going to vomit.  She reported that she was able to eat fruits bananas and strawberries just fine.  Her other GI history includes constipation.  Imaging of her abdomen has shown large stool burden's with mild gastric distention with retained food on the one CT scan in 2021 without any mention of this again in her subsequent to CT scans.  The nurse practitioners recommendations at that consultation were"  - Symptoms could be consistent with gastroparesis (delayed stomach emptying) - Read up on gastroparesis diet handout sheets - Gastric emptying scan ordered to rule out gastroparesis - In interim, try Reglan (metoclopramide) 5 mg tablet - 15 minutes before meals and nightly. No more than 4 times daily. - If above is unhelpful or no answers, endoscopy and colonoscopy can be considered   It does not appear that the patient underwent a gastric emptying study since that office visit.  Past Medical History:  Diagnosis Date   Allergy     Anxiety    Arthritis    Depression    History of kidney stones    Kidney stone    Migraines    Pneumonia    PONV (postoperative nausea and vomiting)    Preeclampsia    Renal disorder    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HCC)    Sepsis (HCC)     Past Surgical History:  Procedure Laterality Date   CYSTOSCOPY W/ RETROGRADES Bilateral 08/06/2015   Procedure: CYSTOSCOPY WITH RETROGRADE PYELOGRAM;  Surgeon: [name], MD;  Location: [facility] ORS;  Service: Urology;  Laterality: Bilateral;   CYSTOSCOPY/URETEROSCOPY/HOLMIUM LASER/STENT PLACEMENT Left 08/06/2015   Procedure: CYSTOSCOPY/URETEROSCOPY/HOLMIUM LASER/STENT PLACEMENT;  Surgeon: [name], MD;  Location: [facility] ORS;  Service: Urology;  Laterality: Left;    Prior to Admission medications   Medication Sig Start Date End Date Taking? Authorizing Provider  clonazePAM (KLONOPIN) 0.5 MG tablet Take 0.5 tablets (0.25 mg total) by mouth 2 (two) times daily as needed for anxiety. Patient taking differently: Take 0.25 mg by mouth as needed. 07/16/20   Cannady, Corrie Dandy T, NP  feeding supplement (ENSURE ENLIVE / ENSURE PLUS) LIQD Take 237 mLs by mouth 3 (three) times daily between meals. 01/18/21   Kathlen Mody, MD  gabapentin (NEURONTIN) 100 MG capsule Take 100 mg twice a day for 1 week, then increase to 200 mg twice a day 11/25/20   [provider]  lisdexamfetamine (VYVANSE) 30 MG capsule Take 1 capsule (30 mg total) by mouth daily. 05/11/21   Cannady, Corrie Dandy T, NP  lisdexamfetamine (VYVANSE) 30 MG capsule Take 1 capsule (30 mg total) by mouth daily. 06/10/21   Cannady, Corrie Dandy T, NP  nortriptyline (PAMELOR) 10 MG capsule Take 20 mg by mouth at bedtime. 07/17/20   [provider]  ondansetron (ZOFRAN-ODT) 4 MG disintegrating tablet Take 1 tablet (4 mg total) by mouth every 8 (eight) hours as needed for nausea or vomiting. 05/11/21   Marjie Skiff, NP    Family History  Problem Relation Age of Onset   Multiple sclerosis Other     Sjogren's syndrome Other        Aunt kim   Kidney Stones Other    Anxiety disorder Mother    Multiple sclerosis Mother    Raynaud syndrome Father    Anxiety disorder Father    Diabetes Father    Anxiety disorder Maternal Grandmother    COPD Maternal Grandmother    Heart disease Maternal Grandfather    Multiple sclerosis Sister    Lung disease Paternal Grandmother    Autoimmune disease Paternal Grandmother    Kidney disease Neg Hx    Bladder Cancer Neg Hx      Social History   Tobacco Use   Smoking status: Former    Packs/day: 0.30    Years: 11.00    Pack years: 3.30    Types: Cigarettes    Quit date: 2021    Years since quitting: 1.9   Smokeless tobacco: Never  Vaping Use   Vaping Use: Some days  Substance Use Topics   Alcohol use: No    Alcohol/week: 0.0 standard drinks   Drug use: Not Currently    Types: Marijuana    Comment: last used 2021    Allergies as of 06/09/2021 - Review Complete 05/19/2021  Allergen Reaction Noted   Sulfa antibiotics Anaphylaxis 04/20/2015   Molnupiravir Hives and Itching 04/20/2021   Amoxicillin Itching and Other (See Comments) 08/27/2020   Prednisone Rash 04/18/2021    Review of Systems:    All systems reviewed and negative except where noted in HPI.   Physical Exam:  There were no vitals taken for this visit. No LMP recorded. General:   Alert,  Well-developed, well-nourished, pleasant and cooperative in NAD Head:  Normocephalic and atraumatic. Eyes:  Sclera clear, no icterus.   Conjunctiva pink. Ears:  Normal auditory acuity. Neck:  Supple; no masses or thyromegaly. Lungs:  Respirations even and unlabored.  Clear throughout to auscultation.   No wheezes, crackles, or rhonchi. No acute distress. Heart:  Regular rate and rhythm; no murmurs, clicks, rubs, or gallops. Abdomen:  Normal bowel sounds.  No bruits.  Soft, non-tender and non-distended without masses, hepatosplenomegaly or hernias noted.  No guarding or rebound  tenderness.  Negative Carnett sign.   Rectal:  Deferred.  Pulses:  Normal pulses noted. Extremities:  No clubbing or edema.  No cyanosis. Neurologic:  Alert and oriented x3;  grossly normal neurologically. Skin:  Intact without significant lesions or rashes.  No jaundice. Lymph Nodes:  No significant cervical adenopathy. Psych:  Alert and cooperative. Normal mood and affect.  Imaging Studies: No results found.  Assessment and Plan:   Megan Bentley is a 35 y.o. y/o female ***    Megan Minium, MD. Clementeen Graham    Note: This dictation was prepared with Dragon dictation along with smaller phrase technology. Any transcriptional errors that  result from this process are unintentional.

## 2021-06-10 DIAGNOSIS — F329 Major depressive disorder, single episode, unspecified: Secondary | ICD-10-CM | POA: Diagnosis not present

## 2021-06-11 ENCOUNTER — Ambulatory Visit: Payer: Medicaid Other | Admitting: Physician Assistant

## 2021-06-12 ENCOUNTER — Other Ambulatory Visit: Payer: Self-pay | Admitting: Obstetrics and Gynecology

## 2021-06-12 NOTE — H&P (Signed)
Megan Bentley is a 35 y.o. 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 .pt had been previously workup for a TVH , bilateral salpingectomy and anterior colporrhaphy. Pt had to reschedule surgery  . She continues to have urinary urgency , some SUI , urinary retention at time .  G6P2  She wishes now to proceed with surgery .    She was entertaining a hemorrhoidectomy with Dr Lysle Pearl but now declines     Past Medical History:  has a past medical history of Anemia, Anxiety, Anxiety disorder (11/27/2019), Arthritis (September 21 2016), Chronic kidney disease, Preeclampsia, Tobacco abuse (09/03/2016), and Vaginal candidiasis (11/27/2019).  Past Surgical History:  has a past surgical history that includes Kidney stone removal and wisdom tooth (Bilateral). Family History: family history includes Anxiety in her father, maternal grandmother, and mother; Arthritis in her maternal grandmother and paternal grandmother; Coronary Artery Disease (Blocked arteries around heart) in her maternal grandfather, maternal grandmother, and paternal grandfather; Deep vein thrombosis (DVT or abnormal blood clot formation) in her father; High blood pressure (Hypertension) in her father, maternal grandmother, and mother; Hyperlipidemia (Elevated cholesterol) in her father and maternal grandmother; Multiple sclerosis in her mother and sister; Osteoarthritis in her father; Sjogren's syndrome in her father; Skin cancer in her maternal grandfather; Uterine cancer in her paternal grandmother. Social History:  reports that she has quit smoking. Her smoking use included cigarettes. She has a 16.00 pack-year smoking history. She has never used smokeless tobacco. She reports that she does not drink alcohol and does not use drugs. OB/GYN History:          OB History     Gravida  2   Para  2   Term  2   Preterm      AB      Living  2      SAB      IAB      Ectopic      Molar       Multiple  0   Live Births  2             Allergies: is allergic to sulfa (sulfonamide antibiotics), amoxicillin, and other. Medications:   Current Outpatient Medications:    clonazePAM (KLONOPIN) 0.5 MG tablet, Take 0.5mg  in the morning, 0.25mg  in the afternoon (1/2 tablet), and 0.5mg  at night., Disp: 75 tablet, Rfl: 5   cyclobenzaprine (FLEXERIL) 5 MG tablet, Take 5 mg by mouth 3 (three) times daily as needed for Muscle spasms, Disp: , Rfl:    gabapentin (NEURONTIN) 100 MG capsule, Take 200 mg twice a day, Disp: 120 capsule, Rfl: 5   ondansetron (ZOFRAN) 8 MG tablet, Take 1 tablet (8 mg total) by mouth every 8 (eight) hours as needed for Nausea, Disp: 30 tablet, Rfl: 0   VYVANSE 30 mg capsule, Take 1 capsule (30 mg total) by mouth once daily, Disp: , Rfl:    diphenhydrAMINE (BENADRYL) 25 mg capsule, as needed (Patient not taking: Reported on 05/28/2021), Disp: , Rfl:    FLUoxetine (PROZAC) 10 MG capsule, Take 1 capsule (10 mg total) by mouth once daily for 30 days (Patient not taking: Reported on 01/08/2021), Disp: 30 capsule, Rfl: 1   ibuprofen (MOTRIN) 600 MG tablet, Take 600 mg by mouth every 6 (six) hours as needed (Patient not taking: Reported on 05/28/2021), Disp: , Rfl:    metoclopramide (REGLAN) 5 MG tablet, Take 1 tablet (5 mg total) by mouth  4 (four) times daily (Patient not taking: Reported on 05/28/2021), Disp: 90 tablet, Rfl: 0   nortriptyline (PAMELOR) 10 MG capsule, Start Nortriptyline (Pamelor) 10 mg nightly for one week, then increase to 20 mg nightly for one week, then increase to 30mg  nightly. (Patient not taking: Reported on 05/28/2021), Disp: 90 capsule, Rfl: 5   ondansetron (ZOFRAN ODT) 4 MG disintegrating tablet, One tablet by mouth every 6 hours as needed for nausea. Do not chew, will dissolve. (Patient not taking: Reported on 05/28/2021), Disp: 56 tablet, Rfl: 2   pediatric multivitamin (PEDIATRIC MULTIVITAMIN) chewable tablet, Take 2 tablets by mouth once daily  (Patient not taking: Reported on 05/28/2021), Disp: , Rfl:    polyethylene glycol (MIRALAX) packet, Take 1 packet (17 g total) by mouth 2 (two) times daily Mix in 4-8ounces of fluid prior to taking., Disp: 30 packet, Rfl: 1   propranoloL (INDERAL) 10 MG tablet, Take 1 tablet by mouth once daily (Patient not taking: Reported on 03/11/2021), Disp: , Rfl:    QUEtiapine (SEROQUEL) 25 MG tablet, Take 12.5 mg at night for 1 week, then increase to 25 mg and continue (Patient not taking: Reported on 03/11/2021), Disp: 30 tablet, Rfl: 3   solifenacin (VESICARE) 5 MG tablet, Take 1 tablet (5 mg total) by mouth once daily, Disp: 30 tablet, Rfl: 11   ubrogepant 50 mg Tab, Take 50 mg by mouth as needed Take 50mg  at headache onset. Can repeat after 2 hours if needed. Do not exceed 200mg  in 24 hours. (Patient not taking: Reported on 02/09/2021), Disp: 20 tablet, Rfl: 1   Review of Systems: General:                      No fatigue or weight loss Eyes:                           No vision changes Ears:                            No hearing difficulty Respiratory:                No cough or shortness of breath Pulmonary:                  No asthma or shortness of breath Cardiovascular:           No chest pain, palpitations, dyspnea on exertion Gastrointestinal:          No abdominal bloating, chronic diarrhea, constipations, masses, pain or hematochezia Genitourinary:             No hematuria, dysuria, abnormal vaginal discharge, pelvic pain, Menometrorrhagia Lymphatic:                   No swollen lymph nodes Musculoskeletal:         No muscle weakness Neurologic:                  No extremity weakness, syncope, seizure disorder Psychiatric:                  No history of depression, delusions or suicidal/homicidal ideation      Exam:       Vitals:    06/16/21  BP: 132/84  Pulse: (!) 111      Body mass index is 15.62 kg/m.   WDWN white/ female in NAD   Lungs:  CTA  CV : RRR without murmur   Neck:  no  thyromegaly Abdomen: soft , no mass, normal active bowel sounds,  non-tender, no rebound tenderness Pelvic: tanner stage 5 ,  External genitalia: vulva /labia no lesions Urethra: no prolapse Vagina: normal physiologic d/c, third degree cystocele with valsalva ,, second degree rectocele. + laxity of the perineal body Cervix: no lesions, no cervical motion tenderness  . Hard stool palpated r/v septum and laterally  Uterus: second degree descensus,normal size shape and contour, non-tender Adnexa: no mass,  non-tender   Rectovaginal: no mass heme negative   Impression:    The primary encounter diagnosis was Cystocele, midline. Diagnoses of Uterus descensus, Rectocele, female,      Plan:    Pt has been recounseled regarding the role for TVH , bilateral salpingectomy and anterior /posterior colporrhaphy  And perineoplasty . She has been counseled for the potential risk of worsening incontinence , dyspareunia , urinary retention ,    Benefits and risks to surgery: The proposed benefit of the surgery has been discussed with the patient. The possible risks include, but are not limited to: organ injury to the bowel , bladder, ureters, and major blood vessels and nerves. There is a possibility of additional surgeries resulting from these injuries. There is also the risk of blood transfusion and the need to receive blood products during or after the procedure which may rarely lead to HIV or Hepatitis C infection. There is a risk of developing a deep venous thrombosis or a pulmonary embolism . There is the possibility of wound infection and also anesthetic complications, even the rare possibility of death. The patient understands these risks and wishes to proceed. All questions have been answered and the consent has been signed.       No follow-ups on file.   Vilma Prader, MD

## 2021-06-14 DIAGNOSIS — Z419 Encounter for procedure for purposes other than remedying health state, unspecified: Secondary | ICD-10-CM | POA: Diagnosis not present

## 2021-06-18 ENCOUNTER — Other Ambulatory Visit
Admission: RE | Admit: 2021-06-18 | Discharge: 2021-06-18 | Disposition: A | Discharge status: Home / Self Care | Payer: Medicaid Other | Source: Ambulatory Visit | Attending: Obstetrics and Gynecology | Admitting: Obstetrics and Gynecology

## 2021-06-18 ENCOUNTER — Other Ambulatory Visit: Payer: Self-pay

## 2021-06-18 DIAGNOSIS — Z79899 Other long term (current) drug therapy: Secondary | ICD-10-CM

## 2021-06-18 HISTORY — DX: Anemia, unspecified: D64.9

## 2021-06-18 HISTORY — DX: Attention-deficit hyperactivity disorder, unspecified type: F90.9

## 2021-06-18 NOTE — Patient Instructions (Addendum)
Your procedure is scheduled on: 06/26/21 - Friday Report to the Registration Desk on the 1st floor of the [facility]. To find out your arrival time, please call [phone] between 1PM - 3PM on: 06/25/21 - Thursday Report to [facility] Labs on 06/22/21 at 3 pm.  REMEMBER: Instructions that are not followed completely may result in serious medical risk, up to and including death; or upon the discretion of your surgeon and anesthesiologist your surgery may need to be rescheduled.  Do not eat food after midnight the night before surgery.  No gum chewing, lozengers or hard candies.  You may however, drink CLEAR liquids up to 2 hours before you are scheduled to arrive for your surgery. Do not drink anything within 2 hours of your scheduled arrival time.  Clear liquids include: - water  - apple juice without pulp - gatorade (not RED, PURPLE, OR BLUE) - black coffee or tea (Do NOT add milk or creamers to the coffee or tea) Do NOT drink anything that is not on this list.   In addition, your doctor has ordered for you to drink the provided  Ensure Pre-Surgery Clear Carbohydrate Drink  Drinking this carbohydrate drink up to two hours before surgery helps to reduce insulin resistance and improve patient outcomes. Please complete drinking 2 hours prior to scheduled arrival time.  TAKE THESE MEDICATIONS THE MORNING OF SURGERY WITH A SIP OF WATER:  - clonazePAM (KLONOPIN) 0.5 MG tablet - lisdexamfetamine (VYVANSE) 30 MG capsule  One week prior to surgery: Stop Anti-inflammatories (NSAIDS) such as Advil, Aleve, Ibuprofen, Motrin, Naproxen, Naprosyn and Aspirin based products such as Excedrin, Goodys Powder, BC Powder.  Stop ANY OVER THE COUNTER supplements until after surgery.  You may take Tylenol if needed for pain up until the day of surgery.  No Alcohol for 24 hours before or after surgery.  No Smoking including e-cigarettes for 24 hours prior to surgery.  No chewable  tobacco products for at least 6 hours prior to surgery.  No nicotine patches on the day of surgery.  Do not use any "recreational" drugs for at least a week prior to your surgery.  Please be advised that the combination of cocaine and anesthesia may have negative outcomes, up to and including death. If you test positive for cocaine, your surgery will be cancelled.  On the morning of surgery brush your teeth with toothpaste and water, you may rinse your mouth with mouthwash if you wish. Do not swallow any toothpaste or mouthwash.  Do not wear jewelry, make-up, hairpins, clips or nail polish.  Do not wear lotions, powders, or perfumes.   Do not shave body from the neck down 48 hours prior to surgery just in case you cut yourself which could leave a site for infection.  Also, freshly shaved skin may become irritated if using the CHG soap.  Contact lenses, hearing aids and dentures may not be worn into surgery.  Do not bring valuables to the hospital. [facility] is not responsible for any missing/lost belongings or valuables.   Notify your doctor if there is any change in your medical condition (cold, fever, infection).  Wear comfortable clothing (specific to your surgery type) to the hospital.  After surgery, you can help prevent lung complications by doing breathing exercises.  Take deep breaths and cough every 1-2 hours. Your doctor may order a device called an Incentive Spirometer to help you take deep breaths. When coughing or sneezing, hold a pillow firmly against  your incision with both hands. This is called splinting. Doing this helps protect your incision. It also decreases belly discomfort.  If you are being admitted to the hospital overnight, leave your suitcase in the car. After surgery it may be brought to your room.  If you are being discharged the day of surgery, you will not be allowed to drive home. You will need a responsible adult (18 years or older) to drive you  home and stay with you that night.   If you are taking public transportation, you will need to have a responsible adult (18 years or older) with you. Please confirm with your physician that it is acceptable to use public transportation.   Please call the Pre-admissions Testing Dept. at [phone] if you have any questions about these instructions.  Surgery Visitation Policy:  Patients undergoing a surgery or procedure may have one family member or support person with them as long as that person is not COVID-19 positive or experiencing its symptoms.  That person may remain in the waiting area during the procedure and may rotate out with other people.  Inpatient Visitation:    Visiting hours are 7 a.m. to 8 p.m. Up to two visitors ages 16+ are allowed at one time in a patient room. The visitors may rotate out with other people during the day. Visitors must check out when they leave, or other visitors will not be allowed. One designated support person may remain overnight. The visitor must pass COVID-19 screenings, use hand sanitizer when entering and exiting the patients room and wear a mask at all times, including in the patients room. Patients must also wear a mask when staff or their visitor are in the room. Masking is required regardless of vaccination status.

## 2021-06-22 ENCOUNTER — Other Ambulatory Visit
Admission: RE | Admit: 2021-06-22 | Discharge: 2021-06-22 | Disposition: A | Discharge status: Home / Self Care | Payer: Medicaid Other | Source: Ambulatory Visit | Attending: Obstetrics and Gynecology | Admitting: Obstetrics and Gynecology

## 2021-06-22 ENCOUNTER — Other Ambulatory Visit: Payer: Self-pay

## 2021-06-22 DIAGNOSIS — Z01818 Encounter for other preprocedural examination: Secondary | ICD-10-CM

## 2021-06-22 DIAGNOSIS — Z01812 Encounter for preprocedural laboratory examination: Secondary | ICD-10-CM | POA: Insufficient documentation

## 2021-06-22 LAB — BASIC METABOLIC PANEL
Anion gap: 7 (ref 5–15)
BUN: 14 mg/dL (ref 6–20)
CO2: 25 mmol/L (ref 22–32)
Calcium: 8.8 mg/dL — ABNORMAL LOW (ref 8.9–10.3)
Chloride: 104 mmol/L (ref 98–111)
Creatinine, Ser: 0.63 mg/dL (ref 0.44–1.00)
GFR, Estimated: >60 mL/min (ref >60)
Glucose, Bld: 64 mg/dL — ABNORMAL LOW (ref 70–99)
Potassium: 3.3 mmol/L — ABNORMAL LOW (ref 3.5–5.1)
Sodium: 136 mmol/L (ref 135–145)

## 2021-06-22 LAB — TYPE AND SCREEN
ABO/RH(D): O POS
Antibody Screen: NEGATIVE

## 2021-06-22 LAB — CBC
HCT: 34.3 % — ABNORMAL LOW (ref 36.0–46.0)
Hemoglobin: 11.7 g/dL — ABNORMAL LOW (ref 12.0–15.0)
MCH: 32.1 pg (ref 26.0–34.0)
MCHC: 34.1 g/dL (ref 30.0–36.0)
MCV: 94 fL (ref 80.0–100.0)
Platelets: 274 10*3/uL (ref 150–400)
RBC: 3.65 MIL/uL — ABNORMAL LOW (ref 3.87–5.11)
RDW: 12.2 % (ref 11.5–15.5)
WBC: 8.2 10*3/uL (ref 4.0–10.5)
nRBC: 0 % (ref 0.0–0.2)

## 2021-06-26 ENCOUNTER — Ambulatory Visit: Payer: Medicaid Other | Admitting: Anesthesiology

## 2021-06-26 ENCOUNTER — Encounter: Payer: Self-pay | Admitting: Obstetrics and Gynecology

## 2021-06-26 ENCOUNTER — Observation Stay
Admission: RE | Admit: 2021-06-26 | Discharge: 2021-06-28 | Disposition: A | Discharge status: Home / Self Care | Payer: Medicaid Other | Source: Ambulatory Visit | Attending: Obstetrics and Gynecology | Admitting: Obstetrics and Gynecology

## 2021-06-26 ENCOUNTER — Encounter
Admission: RE | Disposition: A | Discharge status: Home / Self Care | Payer: Self-pay | Source: Ambulatory Visit | Attending: Obstetrics and Gynecology

## 2021-06-26 ENCOUNTER — Other Ambulatory Visit: Payer: Self-pay

## 2021-06-26 DIAGNOSIS — F419 Anxiety disorder, unspecified: Secondary | ICD-10-CM | POA: Diagnosis not present

## 2021-06-26 DIAGNOSIS — N8111 Cystocele, midline: Principal | ICD-10-CM | POA: Insufficient documentation

## 2021-06-26 DIAGNOSIS — K623 Rectal prolapse: Secondary | ICD-10-CM | POA: Diagnosis not present

## 2021-06-26 DIAGNOSIS — N841 Polyp of cervix uteri: Secondary | ICD-10-CM | POA: Diagnosis not present

## 2021-06-26 DIAGNOSIS — N8189 Other female genital prolapse: Secondary | ICD-10-CM | POA: Diagnosis not present

## 2021-06-26 DIAGNOSIS — N816 Rectocele: Secondary | ICD-10-CM | POA: Insufficient documentation

## 2021-06-26 DIAGNOSIS — Z87891 Personal history of nicotine dependence: Secondary | ICD-10-CM | POA: Diagnosis not present

## 2021-06-26 DIAGNOSIS — R339 Retention of urine, unspecified: Secondary | ICD-10-CM | POA: Insufficient documentation

## 2021-06-26 DIAGNOSIS — N819 Female genital prolapse, unspecified: Secondary | ICD-10-CM | POA: Diagnosis present

## 2021-06-26 DIAGNOSIS — Z79899 Other long term (current) drug therapy: Secondary | ICD-10-CM | POA: Diagnosis not present

## 2021-06-26 DIAGNOSIS — N189 Chronic kidney disease, unspecified: Secondary | ICD-10-CM | POA: Diagnosis not present

## 2021-06-26 DIAGNOSIS — Z9889 Other specified postprocedural states: Secondary | ICD-10-CM

## 2021-06-26 DIAGNOSIS — Z01818 Encounter for other preprocedural examination: Secondary | ICD-10-CM

## 2021-06-26 DIAGNOSIS — N814 Uterovaginal prolapse, unspecified: Secondary | ICD-10-CM | POA: Diagnosis not present

## 2021-06-26 DIAGNOSIS — N812 Incomplete uterovaginal prolapse: Secondary | ICD-10-CM | POA: Diagnosis not present

## 2021-06-26 HISTORY — PX: VAGINAL HYSTERECTOMY: SHX2639

## 2021-06-26 HISTORY — PX: ANTERIOR AND POSTERIOR REPAIR: SHX5121

## 2021-06-26 HISTORY — PX: BILATERAL SALPINGECTOMY: SHX5743

## 2021-06-26 LAB — POCT I-STAT, CHEM 8
BUN: 12 mg/dL (ref 6–20)
Calcium, Ion: 1.15 mmol/L (ref 1.15–1.40)
Chloride: 105 mmol/L (ref 98–111)
Creatinine, Ser: 0.7 mg/dL (ref 0.44–1.00)
Glucose, Bld: 103 mg/dL — ABNORMAL HIGH (ref 70–99)
HCT: 37 % (ref 36.0–46.0)
Hemoglobin: 12.6 g/dL (ref 12.0–15.0)
Potassium: 3.6 mmol/L (ref 3.5–5.1)
Sodium: 139 mmol/L (ref 135–145)
TCO2: 25 mmol/L (ref 22–32)

## 2021-06-26 LAB — URINE DRUG SCREEN, QUALITATIVE (ARMC ONLY)
Amphetamines, Ur Screen: POSITIVE — AB
Barbiturates, Ur Screen: NOT DETECTED
Benzodiazepine, Ur Scrn: NOT DETECTED
Cannabinoid 50 Ng, Ur ~~LOC~~: POSITIVE — AB
Cocaine Metabolite,Ur ~~LOC~~: NOT DETECTED
MDMA (Ecstasy)Ur Screen: NOT DETECTED
Methadone Scn, Ur: NOT DETECTED
Opiate, Ur Screen: NOT DETECTED
Phencyclidine (PCP) Ur S: NOT DETECTED
Tricyclic, Ur Screen: POSITIVE — AB

## 2021-06-26 LAB — POCT PREGNANCY, URINE: Preg Test, Ur: NEGATIVE

## 2021-06-26 LAB — PREGNANCY, URINE: Preg Test, Ur: NEGATIVE

## 2021-06-26 SURGERY — HYSTERECTOMY, VAGINAL
Anesthesia: General | Site: Vagina

## 2021-06-26 MED ORDER — ONDANSETRON HCL 4 MG PO TABS: 4.0000 mg | ORAL_TABLET | Freq: Four times a day (QID) | ORAL | Status: DC | PRN

## 2021-06-26 MED ORDER — LACTATED RINGERS IV SOLN: INTRAVENOUS | Status: DC

## 2021-06-26 MED ORDER — CHLORHEXIDINE GLUCONATE 0.12 % MT SOLN
OROMUCOSAL | Status: AC
  Administered 2021-06-26: 15 mL via OROMUCOSAL
  Filled 2021-06-26: qty 15

## 2021-06-26 MED ORDER — MORPHINE SULFATE 1 MG/ML IV SOLN PCA
INTRAVENOUS | Status: DC
  Administered 2021-06-26: 15.79 mg via INTRAVENOUS
  Administered 2021-06-26: 1 mL via INTRAVENOUS
  Administered 2021-06-26: 13.33 mL via INTRAVENOUS
  Administered 2021-06-27: 13.75 mg via INTRAVENOUS
  Administered 2021-06-27: 18.13 mg via INTRAVENOUS
  Administered 2021-06-27: 6.94 mg via INTRAVENOUS
  Administered 2021-06-27: 13.7 mg via INTRAVENOUS
  Filled 2021-06-26 (×4): qty 30

## 2021-06-26 MED ORDER — PROPOFOL 10 MG/ML IV BOLUS
INTRAVENOUS | Status: AC
  Filled 2021-06-26: qty 40

## 2021-06-26 MED ORDER — PROPOFOL 500 MG/50ML IV EMUL
INTRAVENOUS | Status: DC | PRN
  Administered 2021-06-26: 30 ug/kg/min via INTRAVENOUS

## 2021-06-26 MED ORDER — HYDROMORPHONE HCL 1 MG/ML IJ SOLN
INTRAMUSCULAR | Status: AC
  Filled 2021-06-26: qty 1

## 2021-06-26 MED ORDER — GABAPENTIN 600 MG PO TABS: 300.0000 mg | ORAL_TABLET | Freq: Two times a day (BID) | ORAL | Status: DC

## 2021-06-26 MED ORDER — ESTROGENS CONJUGATED 0.625 MG/GM VA CREA
TOPICAL_CREAM | VAGINAL | Status: AC
  Filled 2021-06-26: qty 30

## 2021-06-26 MED ORDER — MORPHINE SULFATE 1 MG/ML IV SOLN PCA
INTRAVENOUS | Status: DC
  Administered 2021-06-26: 1 mg via INTRAVENOUS
  Filled 2021-06-26: qty 30

## 2021-06-26 MED ORDER — ONDANSETRON HCL 4 MG/2ML IJ SOLN
4.0000 mg | Freq: Four times a day (QID) | INTRAMUSCULAR | Status: DC | PRN
  Administered 2021-06-26: 4 mg via INTRAVENOUS
  Filled 2021-06-26: qty 2

## 2021-06-26 MED ORDER — ONDANSETRON HCL 4 MG/2ML IJ SOLN: 4.0000 mg | Freq: Once | INTRAMUSCULAR | Status: DC | PRN

## 2021-06-26 MED ORDER — FENTANYL CITRATE (PF) 100 MCG/2ML IJ SOLN
INTRAMUSCULAR | Status: AC
  Filled 2021-06-26: qty 2

## 2021-06-26 MED ORDER — SODIUM CHLORIDE 0.9% FLUSH: 9.0000 mL | INTRAVENOUS | Status: DC | PRN

## 2021-06-26 MED ORDER — ONDANSETRON HCL 4 MG/2ML IJ SOLN: 4.0000 mg | Freq: Four times a day (QID) | INTRAMUSCULAR | Status: DC | PRN

## 2021-06-26 MED ORDER — MIDAZOLAM HCL 2 MG/2ML IJ SOLN
INTRAMUSCULAR | Status: DC | PRN
  Administered 2021-06-26: 2 mg via INTRAVENOUS

## 2021-06-26 MED ORDER — CHLORHEXIDINE GLUCONATE 0.12 % MT SOLN: 15.0000 mL | Freq: Once | OROMUCOSAL | Status: AC

## 2021-06-26 MED ORDER — ONDANSETRON 4 MG PO TBDP
4.0000 mg | ORAL_TABLET | Freq: Four times a day (QID) | ORAL | Status: DC | PRN
  Administered 2021-06-28: 4 mg via ORAL
  Filled 2021-06-26: qty 1

## 2021-06-26 MED ORDER — ACETAMINOPHEN 500 MG PO TABS
ORAL_TABLET | ORAL | Status: AC
  Administered 2021-06-26: 1000 mg via ORAL
  Filled 2021-06-26: qty 2

## 2021-06-26 MED ORDER — SILVER NITRATE-POT NITRATE 75-25 % EX MISC
CUTANEOUS | Status: AC
  Filled 2021-06-26: qty 10

## 2021-06-26 MED ORDER — FENTANYL CITRATE (PF) 100 MCG/2ML IJ SOLN
25.0000 ug | INTRAMUSCULAR | Status: DC | PRN
  Administered 2021-06-26 (×6): 25 ug via INTRAVENOUS

## 2021-06-26 MED ORDER — DIPHENHYDRAMINE HCL 12.5 MG/5ML PO ELIX
12.5000 mg | ORAL_SOLUTION | Freq: Four times a day (QID) | ORAL | Status: DC | PRN
  Filled 2021-06-26: qty 5

## 2021-06-26 MED ORDER — FENTANYL CITRATE PF 50 MCG/ML IJ SOSY: 25.0000 ug | PREFILLED_SYRINGE | INTRAMUSCULAR | Status: DC | PRN

## 2021-06-26 MED ORDER — OXYCODONE HCL 5 MG PO TABS
5.0000 mg | ORAL_TABLET | ORAL | Status: DC | PRN
  Administered 2021-06-26: 5 mg via ORAL

## 2021-06-26 MED ORDER — LIDOCAINE HCL (PF) 2 % IJ SOLN
INTRAMUSCULAR | Status: AC
  Filled 2021-06-26: qty 5

## 2021-06-26 MED ORDER — WITCH HAZEL-GLYCERIN EX PADS
MEDICATED_PAD | CUTANEOUS | Status: DC | PRN
  Filled 2021-06-26: qty 100

## 2021-06-26 MED ORDER — ROCURONIUM BROMIDE 100 MG/10ML IV SOLN
INTRAVENOUS | Status: DC | PRN
  Administered 2021-06-26: 50 mg via INTRAVENOUS
  Administered 2021-06-26 (×3): 10 mg via INTRAVENOUS

## 2021-06-26 MED ORDER — POVIDONE-IODINE 10 % EX SWAB: 2.0000 "application " | Freq: Once | CUTANEOUS | Status: DC

## 2021-06-26 MED ORDER — LIDOCAINE-EPINEPHRINE 1 %-1:100000 IJ SOLN
INTRAMUSCULAR | Status: AC
  Filled 2021-06-26: qty 1

## 2021-06-26 MED ORDER — PHENYLEPHRINE HCL (PRESSORS) 10 MG/ML IV SOLN
INTRAVENOUS | Status: AC
  Filled 2021-06-26: qty 1

## 2021-06-26 MED ORDER — ONDANSETRON HCL 4 MG/2ML IJ SOLN
INTRAMUSCULAR | Status: AC
  Filled 2021-06-26: qty 2

## 2021-06-26 MED ORDER — ORAL CARE MOUTH RINSE: 15.0000 mL | Freq: Once | OROMUCOSAL | Status: AC

## 2021-06-26 MED ORDER — KETOROLAC TROMETHAMINE 30 MG/ML IJ SOLN
30.0000 mg | Freq: Four times a day (QID) | INTRAMUSCULAR | Status: DC
Start: 2021-06-26 — End: 2021-06-27
  Administered 2021-06-26 – 2021-06-27 (×3): 30 mg via INTRAVENOUS
  Filled 2021-06-26 (×4): qty 1

## 2021-06-26 MED ORDER — HYDROMORPHONE HCL 1 MG/ML IJ SOLN
INTRAMUSCULAR | Status: DC | PRN
  Administered 2021-06-26: .25 mg via INTRAVENOUS
  Administered 2021-06-26: .5 mg via INTRAVENOUS
  Administered 2021-06-26: .25 mg via INTRAVENOUS

## 2021-06-26 MED ORDER — ACETAMINOPHEN 10 MG/ML IV SOLN
1000.0000 mg | Freq: Four times a day (QID) | INTRAVENOUS | Status: DC | PRN
  Administered 2021-06-26 – 2021-06-27 (×3): 1000 mg via INTRAVENOUS
  Filled 2021-06-26 (×3): qty 100

## 2021-06-26 MED ORDER — FENTANYL CITRATE (PF) 100 MCG/2ML IJ SOLN
INTRAMUSCULAR | Status: DC | PRN
  Administered 2021-06-26: 25 ug via INTRAVENOUS
  Administered 2021-06-26: 50 ug via INTRAVENOUS
  Administered 2021-06-26: 25 ug via INTRAVENOUS

## 2021-06-26 MED ORDER — DIPHENHYDRAMINE HCL 50 MG/ML IJ SOLN: 12.5000 mg | Freq: Four times a day (QID) | INTRAMUSCULAR | Status: DC | PRN

## 2021-06-26 MED ORDER — KETAMINE HCL 50 MG/5ML IJ SOSY
PREFILLED_SYRINGE | INTRAMUSCULAR | Status: AC
  Filled 2021-06-26: qty 5

## 2021-06-26 MED ORDER — GABAPENTIN 300 MG PO CAPS: 300.0000 mg | ORAL_CAPSULE | ORAL | Status: AC

## 2021-06-26 MED ORDER — NALOXONE HCL 0.4 MG/ML IJ SOLN
0.4000 mg | INTRAMUSCULAR | Status: DC | PRN
  Filled 2021-06-26: qty 1

## 2021-06-26 MED ORDER — ACETAMINOPHEN 500 MG PO TABS: 1000.0000 mg | ORAL_TABLET | ORAL | Status: AC

## 2021-06-26 MED ORDER — DEXAMETHASONE SODIUM PHOSPHATE 10 MG/ML IJ SOLN
INTRAMUSCULAR | Status: AC
  Filled 2021-06-26: qty 1

## 2021-06-26 MED ORDER — LIDOCAINE-EPINEPHRINE 1 %-1:100000 IJ SOLN
INTRAMUSCULAR | Status: DC | PRN
  Administered 2021-06-26: 27 mL

## 2021-06-26 MED ORDER — CEFAZOLIN SODIUM-DEXTROSE 2-4 GM/100ML-% IV SOLN
2.0000 g | Freq: Once | INTRAVENOUS | Status: AC
Start: 2021-06-26 — End: 2021-06-26
  Administered 2021-06-26: 2 g via INTRAVENOUS

## 2021-06-26 MED ORDER — PRENATAL MULTIVITAMIN CH
1.0000 | ORAL_TABLET | Freq: Every day | ORAL | Status: DC
  Administered 2021-06-27: 1 via ORAL
  Filled 2021-06-26 (×2): qty 1

## 2021-06-26 MED ORDER — LIDOCAINE HCL (CARDIAC) PF 100 MG/5ML IV SOSY
PREFILLED_SYRINGE | INTRAVENOUS | Status: DC | PRN
  Administered 2021-06-26: 60 mg via INTRAVENOUS

## 2021-06-26 MED ORDER — ONDANSETRON HCL 4 MG/2ML IJ SOLN
INTRAMUSCULAR | Status: DC | PRN
  Administered 2021-06-26: 4 mg via INTRAVENOUS

## 2021-06-26 MED ORDER — CEFAZOLIN SODIUM-DEXTROSE 2-4 GM/100ML-% IV SOLN
INTRAVENOUS | Status: AC
  Filled 2021-06-26: qty 100

## 2021-06-26 MED ORDER — FENTANYL CITRATE PF 50 MCG/ML IJ SOSY
25.0000 ug | PREFILLED_SYRINGE | INTRAMUSCULAR | Status: AC | PRN
  Administered 2021-06-26 – 2021-06-27 (×3): 25 ug via INTRAVENOUS
  Filled 2021-06-26 (×3): qty 1

## 2021-06-26 MED ORDER — GABAPENTIN 600 MG PO TABS
300.0000 mg | ORAL_TABLET | Freq: Two times a day (BID) | ORAL | Status: DC
  Filled 2021-06-26: qty 0.5

## 2021-06-26 MED ORDER — GABAPENTIN 300 MG PO CAPS
300.0000 mg | ORAL_CAPSULE | Freq: Two times a day (BID) | ORAL | Status: DC
  Administered 2021-06-26 – 2021-06-28 (×4): 300 mg via ORAL
  Filled 2021-06-26 (×4): qty 1

## 2021-06-26 MED ORDER — OXYCODONE HCL 5 MG PO TABS
ORAL_TABLET | ORAL | Status: AC
  Filled 2021-06-26: qty 1

## 2021-06-26 MED ORDER — PROPOFOL 10 MG/ML IV BOLUS
INTRAVENOUS | Status: DC | PRN
  Administered 2021-06-26: 200 mg via INTRAVENOUS

## 2021-06-26 MED ORDER — KETAMINE HCL 10 MG/ML IJ SOLN
INTRAMUSCULAR | Status: DC | PRN
  Administered 2021-06-26: 30 mg via INTRAVENOUS

## 2021-06-26 MED ORDER — MIDAZOLAM HCL 2 MG/2ML IJ SOLN
INTRAMUSCULAR | Status: AC
  Filled 2021-06-26: qty 2

## 2021-06-26 MED ORDER — KETOROLAC TROMETHAMINE 30 MG/ML IJ SOLN
INTRAMUSCULAR | Status: DC | PRN
  Administered 2021-06-26: 30 mg via INTRAVENOUS

## 2021-06-26 MED ORDER — GABAPENTIN 300 MG PO CAPS
ORAL_CAPSULE | ORAL | Status: AC
  Administered 2021-06-26: 300 mg via ORAL
  Filled 2021-06-26: qty 1

## 2021-06-26 MED ORDER — SUGAMMADEX SODIUM 200 MG/2ML IV SOLN
INTRAVENOUS | Status: DC | PRN
  Administered 2021-06-26: 200 mg via INTRAVENOUS

## 2021-06-26 MED ORDER — HYDROMORPHONE HCL 1 MG/ML IJ SOLN
0.5000 mg | INTRAMUSCULAR | Status: DC | PRN
  Administered 2021-06-26 (×3): 0.5 mg via INTRAVENOUS

## 2021-06-26 SURGICAL SUPPLY — 49 items
BAG URINE DRAIN 2000ML AR STRL (UROLOGICAL SUPPLIES) ×3 IMPLANT
BLADE SURG 15 STRL LF DISP TIS (BLADE) ×2 IMPLANT
BLADE SURG 15 STRL SS (BLADE) ×1
CATH FOLEY 2WAY  5CC 16FR (CATHETERS) ×1
CATH ROBINSON RED A/P 16FR (CATHETERS) ×3 IMPLANT
CATH URTH 16FR FL 2W BLN LF (CATHETERS) ×2 IMPLANT
DRAPE PERI LITHO V/GYN (MISCELLANEOUS) ×3 IMPLANT
DRAPE SURG 17X11 SM STRL (DRAPES) ×3 IMPLANT
DRAPE UNDER BUTTOCK W/FLU (DRAPES) ×3 IMPLANT
ELECT REM PT RETURN 9FT ADLT (ELECTROSURGICAL) ×3
ELECTRODE REM PT RTRN 9FT ADLT (ELECTROSURGICAL) ×2 IMPLANT
GAUZE 4X4 16PLY ~~LOC~~+RFID DBL (SPONGE) ×6 IMPLANT
GAUZE PACK 2X3YD (PACKING) ×3 IMPLANT
GLOVE SURG SYN 8.0 (GLOVE) ×3 IMPLANT
GLOVE SURG SYN 8.0 PF PI (GLOVE) ×2 IMPLANT
GOWN STRL REUS W/ TWL LRG LVL3 (GOWN DISPOSABLE) ×6 IMPLANT
GOWN STRL REUS W/ TWL XL LVL3 (GOWN DISPOSABLE) ×2 IMPLANT
GOWN STRL REUS W/TWL LRG LVL3 (GOWN DISPOSABLE) ×3
GOWN STRL REUS W/TWL XL LVL3 (GOWN DISPOSABLE) ×1
KIT PINK PAD W/HEAD ARE REST (MISCELLANEOUS) ×3
KIT PINK PAD W/HEAD ARM REST (MISCELLANEOUS) ×2 IMPLANT
KIT TURNOVER CYSTO (KITS) ×3 IMPLANT
KIT TURNOVER KIT A (KITS) ×3 IMPLANT
LABEL OR SOLS (LABEL) ×3 IMPLANT
MANIFOLD NEPTUNE II (INSTRUMENTS) ×3 IMPLANT
NEEDLE HYPO 22GX1.5 SAFETY (NEEDLE) ×3 IMPLANT
NS IRRIG 500ML POUR BTL (IV SOLUTION) ×3 IMPLANT
PACK BASIN MINOR ARMC (MISCELLANEOUS) ×3 IMPLANT
PAD OB MATERNITY 4.3X12.25 (PERSONAL CARE ITEMS) ×3 IMPLANT
PAD PREP 24X41 OB/GYN DISP (PERSONAL CARE ITEMS) ×3 IMPLANT
SCRUB EXIDINE 4% CHG 4OZ (MISCELLANEOUS) ×3 IMPLANT
SOL PREP PROV IODINE SCRUB 4OZ (MISCELLANEOUS) ×3 IMPLANT
SOL PREP PVP 2OZ (MISCELLANEOUS) ×3
SOLUTION PREP PVP 2OZ (MISCELLANEOUS) ×2 IMPLANT
SURGILUBE 2OZ TUBE FLIPTOP (MISCELLANEOUS) ×3 IMPLANT
SUT PDS 2-0 27IN (SUTURE) IMPLANT
SUT PDS AB 2-0 CT1 27 (SUTURE) ×3 IMPLANT
SUT VIC AB 0 CT1 27 (SUTURE) ×3
SUT VIC AB 0 CT1 27XCR 8 STRN (SUTURE) ×4 IMPLANT
SUT VIC AB 0 CT1 36 (SUTURE) ×5 IMPLANT
SUT VIC AB 2-0 CT1 36 (SUTURE) IMPLANT
SUT VIC AB 2-0 SH 27 (SUTURE) ×9
SUT VIC AB 2-0 SH 27XBRD (SUTURE) ×6 IMPLANT
SUT VIC AB 3-0 SH 27 (SUTURE)
SUT VIC AB 3-0 SH 27X BRD (SUTURE) IMPLANT
SYR 10ML LL (SYRINGE) ×3 IMPLANT
SYR CONTROL 10ML LL (SYRINGE) ×3 IMPLANT
WATER STERILE IRR 1000ML POUR (IV SOLUTION) ×3 IMPLANT
WATER STERILE IRR 500ML POUR (IV SOLUTION) ×3 IMPLANT

## 2021-06-26 NOTE — Discharge Instructions (Signed)
AMBULATORY SURGERY  ?DISCHARGE INSTRUCTIONS ? ? ?The drugs that you were given will stay in your system until tomorrow so for the next 24 hours you should not: ? ?Drive an automobile ?Make any legal decisions ?Drink any alcoholic beverage ? ? ?You may resume regular meals tomorrow.  Today it is better to start with liquids and gradually work up to solid foods. ? ?You may eat anything you prefer, but it is better to start with liquids, then soup and crackers, and gradually work up to solid foods. ? ? ?Please notify your doctor immediately if you have any unusual bleeding, trouble breathing, redness and pain at the surgery site, drainage, fever, or pain not relieved by medication. ? ? ? ?Additional Instructions: ? ? ? ?Please contact your physician with any problems or Same Day Surgery at [phone], Monday through Friday 6 am to 4 pm, or [place] [facility] [place] Main number at [phone].  ?

## 2021-06-26 NOTE — Progress Notes (Signed)
Talked to Dr. Pernell Dupre about UDS still pending.  Told him she was only positive for barbituates, butalbital, and cannibinoids in the past.  He acknowledged and said it was ok to proceed and shouldn't delay the surgery start.

## 2021-06-26 NOTE — Brief Op Note (Signed)
06/26/2021  10:06 AM  PATIENT:  Megan Bentley  36 y.o. female  PRE-OPERATIVE DIAGNOSIS:  pelvic organ prolapse  POST-OPERATIVE DIAGNOSIS:  pelvic organ prolapses  PROCEDURE:  Procedure(s): HYSTERECTOMY VAGINAL (N/A) BILATERAL SALPINGECTOMY (Bilateral) ANTERIOR (CYSTOCELE) AND POSTERIOR REPAIR (RECTOCELE) (N/A) Perineoplasty  SURGEON:  Surgeon(s) and Role:    * Lacrisha Bielicki, Ihor Austin, MD - Primary    * Conard Novak, MD - Assisting  PHYSICIAN ASSISTANT:   ASSISTANTS: cst   ANESTHESIA:   general  EBL:  20 mL IOF 700 urineoutput 600 cc  BLOOD ADMINISTERED:none  DRAINS: none   LOCAL MEDICATIONS USED:  LIDOCAINE with epi 15 cc  SPECIMEN:  Source of Specimen:  cx uterus and bilateral fallopian tubes   DISPOSITION OF SPECIMEN:  PATHOLOGY  COUNTS:  YES  TOURNIQUET:  * No tourniquets in log *  DICTATION: .Other Dictation: Dictation Number verbal   PLAN OF CARE: Discharge to home after PACU  PATIENT DISPOSITION:  PACU - hemodynamically stable.   Delay start of Pharmacological VTE agent (>24hrs) due to surgical blood loss or risk of bleeding: not applicable

## 2021-06-26 NOTE — Anesthesia Preprocedure Evaluation (Signed)
Anesthesia Evaluation  Patient identified by MRN, date of birth, ID band Patient awake    Reviewed: Allergy & Precautions, H&P , NPO status , Patient's Chart, lab work & pertinent test results, reviewed documented beta blocker date and time   History of Anesthesia Complications (+) PONV and history of anesthetic complications  Airway Mallampati: II  TM Distance: >3 FB Neck ROM: full    Dental  (+) Teeth Intact   Pulmonary neg pulmonary ROS, pneumonia, resolved, former smoker,    Pulmonary exam normal        Cardiovascular Exercise Tolerance: Good hypertension, On Medications negative cardio ROS Normal cardiovascular exam Rhythm:regular Rate:Normal     Neuro/Psych  Headaches, PSYCHIATRIC DISORDERS Anxiety Depression negative neurological ROS  negative psych ROS   GI/Hepatic negative GI ROS, Neg liver ROS, (+) Hepatitis -  Endo/Other  negative endocrine ROS  Renal/GU Renal diseasenegative Renal ROS  negative genitourinary   Musculoskeletal   Abdominal   Peds  Hematology negative hematology ROS (+) Blood dyscrasia, anemia ,   Anesthesia Other Findings Past Medical History: No date: ADHD (attention deficit hyperactivity disorder) No date: Allergy No date: Anemia No date: Anxiety No date: Arthritis No date: Depression No date: History of kidney stones No date: Kidney stone No date: Migraines No date: Pneumonia No date: PONV (postoperative nausea and vomiting) No date: Preeclampsia No date: Renal disorder 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[facility]) No date: Sepsis [facility]) Past Surgical History: 08/06/2015: CYSTOSCOPY W/ RETROGRADES; Bilateral     Comment:  Procedure: CYSTOSCOPY WITH RETROGRADE PYELOGRAM;                Surgeon: [name], MD;  Location: [facility] ORS;                Service: Urology;  Laterality: Bilateral; 08/06/2015: CYSTOSCOPY/URETEROSCOPY/HOLMIUM LASER/STENT PLACEMENT;  Left     Comment:   Procedure: CYSTOSCOPY/URETEROSCOPY/HOLMIUM LASER/STENT               PLACEMENT;  Surgeon: [name], MD;  Location: [facility]               ORS;  Service: Urology;  Laterality: Left; No date: WISDOM TOOTH EXTRACTION BMI    Body Mass Index: 16.12 kg/m     Reproductive/Obstetrics negative OB ROS                             Anesthesia Physical Anesthesia Plan  ASA: 3  Anesthesia Plan: General ETT   Post-op Pain Management:    Induction:   PONV Risk Score and Plan: 4 or greater  Airway Management Planned:   Additional Equipment:   Intra-op Plan:   Post-operative Plan:   Informed Consent: I have reviewed the patients History and Physical, chart, labs and discussed the procedure including the risks, benefits and alternatives for the proposed anesthesia with the patient or authorized representative who has indicated his/her understanding and acceptance.     Dental Advisory Given  Plan Discussed with: CRNA  Anesthesia Plan Comments:         Anesthesia Quick Evaluation

## 2021-06-26 NOTE — Op Note (Signed)
Megan Bentley, Megan Bentley MEDICAL RECORD NO: [id number] ACCOUNT NO: [id number] DATE OF BIRTH: June 11, 1986 FACILITY: [facility] LOCATION: [facility] PHYSICIAN: Suzy Bouchard, MD  Operative Report   DATE OF PROCEDURE: 06/26/2021  PREOPERATIVE DIAGNOSES:   1.  Pelvic organ prolapse. 2.  Uterine descensus. 3.  Cystocele. 4.  Rectocele. 5.  Perineal body relaxation.  POSTOPERATIVE DIAGNOSES: 1.  Pelvic organ prolapse. 2.  Uterine descensus. 3.  Cystocele. 4.  Rectocele. 5.  Perineal body relaxation.  PROCEDURES:    1.  Total vaginal hysterectomy. 2.  Bilateral salpingectomy. 3.  Anterior colporrhaphy. 4.  Posterior colporrhaphy. 5.  Perineoplasty (perineorrhaphy).  SURGEON:  Suzy Bouchard, MD  FIRST ASSISTANT:  Thomasene Mohair, MD  ANESTHESIA:  General endotracheal anesthesia.  INDICATION: A 36 year old, gravida 2, para 2, patient with a long history of pelvic relaxation, notably has second-degree uterine descensus, third-degree cystocele, second-degree rectocele, and shortened perineal body with relaxation.  DESCRIPTION OF PROCEDURE:  After adequate general endotracheal anesthesia, the patient was placed in dorsal supine position with the legs in the candy cane stirrups.  Lower abdomen, perineum, and vagina were prepped and draped in normal sterile fashion.   The patient did receive 2 g of Ancef for surgical prophylaxis.  Timeout was performed.  A weighted speculum was placed in the posterior vaginal vault and the anterior vagina was elevated with a Occupational hygienist.  Two thyroid tenacula were  placed on the cervix.  The cervix was then circumferentially injected with 0.5% lidocaine with 1:100,000 epinephrine.  An Allis clamp was placed on the posterior fornix, and a direct posterior colpotomy incision was made.  Uterosacral ligaments were  bilaterally clamped, transected, and suture ligated with 0 Vicryl suture.  Anterior cervix was circumferentially incised with the  Bovie, and the anterior cul-de-sac was entered sharply.  Deaver retractor was placed within to elevate the bladder  anteriorly.  The cardinal ligaments were then bilaterally clamped, transected, and suture ligated with 0 Vicryl suture followed by clamping of the uterine arteries bilaterally transecting and suture ligating with 0 Vicryl suture.  The cornua were then  bilaterally clamped, transected, and the uterus was delivered.  Each cornual pedicle was doubly ligated with 0 Vicryl suture.  Each fallopian tube was identified, and the distal portion was clamped, removed, and suture ligated with 0 Vicryl suture.   Ovaries appeared normal.  The peritoneum was then closed in a pursestring fashion with 2-0 PDS suture.  Good hemostasis noted.  Attention was directed to the cystocele where the vaginal epithelium was grasped centrally and was then injected with 0.5%  lidocaine with 1:100,000 epinephrine, and the anterior vaginal epithelium was opened centrally to 1 cm distal to the urethral meatus.  These tissues were secured with mosquito clamps, and the endopelvic fascia was then dissected off the subvaginal  epithelium.  Once the defect was reduced, 2-0 Vicryl suture was used with mattress sutures to reduce the cystocele and plicate the tissue centrally.  Five separate sutures were used to close the defect.  The vaginal epithelium was then trimmed and held  for later identification.  Attention was then directed to the posterior vagina, which was grasped at the hymenal ring centrally, and the subepithelium tissues were injected with lidocaine and epinephrine, and the posterior defect was opened centrally  approximately 6 cm. The perirectal fascia or endopelvic fascia was dissected off the subvaginal epithelium.  Healthy tissue was identified laterally, and several mattress sutures with 2-0 Vicryl suture was used to close the defect.  The  vaginal  epithelium was trimmed.  The vaginal cuff was then closed initially  with 0 Vicryl suture, and then the anterior cystocele repair was closed with separate 0 plain gut suture running nonlocking, and the posterior rectocele vaginal epithelium was repaired  with separate 0 Vicryl suture.  A triangle shape wedge was taken from the hymenal body down to the perineal body superficially to remove the epithelium.  The bulbocavernosus and superficial transverse perinei muscles were then reapproximated centrally  with 2 deep crown sutures, 0 Vicryl suture used.  Closure of defect and building the perineal body was accomplished.  The  perineal epithelium was then closed with 3-0 Vicryl subcuticular suture with good approximation of tissues.  The  vagina at the end of the case accommodated 2 fingers adequately without any undue tension and no banjo strings from the previously placed suture were noted.  Of note, the patient's bladder was catheterized to start the case yielding 500 mL, and after the  vaginal hysterectomy was performed, additional catheterization yielded 100 mL of urine.  The patient tolerated the procedure well.  There were no complications.  ESTIMATED BLOOD LOSS:  20 mL.  INTRAOPERATIVE FLUIDS:  700 mL.  URINE OUTPUT:  600 mL.  DISPOSITION:  The patient was taken to recovery room in good condition.   [facility] D: 06/26/2021 10:48:46 am T: 06/26/2021 11:36:00 am  JOB: [id number]/ [id number]

## 2021-06-26 NOTE — Progress Notes (Signed)
Pt scheduled for TVH and A+P repair and perineoplasty  LAbs reviewed . All questions answered . HCG pending   All questions answered .  Proceed

## 2021-06-26 NOTE — Anesthesia Procedure Notes (Signed)
Procedure Name: Intubation Date/Time: 06/26/2021 8:13 AM Performed by: Loletha Grayer, CRNA Pre-anesthesia Checklist: Patient identified, Patient being monitored, Timeout performed, Emergency Drugs available and Suction available Patient Re-evaluated:Patient Re-evaluated prior to induction Oxygen Delivery Method: Circle system utilized Preoxygenation: Pre-oxygenation with 100% oxygen Induction Type: IV induction Ventilation: Mask ventilation without difficulty Laryngoscope Size: Miller and 2 Grade View: Grade I Tube type: Oral Tube size: 7.0 mm Number of attempts: 1 Airway Equipment and Method: Stylet Placement Confirmation: ETT inserted through vocal cords under direct vision, positive ETCO2 and breath sounds checked- equal and bilateral Secured at: 21 cm Tube secured with: Tape Dental Injury: Teeth and Oropharynx as per pre-operative assessment

## 2021-06-26 NOTE — Progress Notes (Signed)
Patient ID: Megan Bentley, female   DOB: 30-Jan-1986, 36 y.o.   MRN: [id number] DOS  Patient admitted for pain relief .  PCA on   BAsal rate increase to 2 mg. Additional toradol also given. Pain is slowly started to improve .   200cc vaginal bleeding dark  I packed to vagina with gauze roll . Good urine output .  Tomorrow plan d/c PCA  And start PO meds  Voiding trial

## 2021-06-26 NOTE — Transfer of Care (Signed)
Immediate Anesthesia Transfer of Care Note  Patient: Megan Bentley  Procedure(s) Performed: HYSTERECTOMY VAGINAL BILATERAL SALPINGECTOMY (Bilateral) ANTERIOR (CYSTOCELE) AND POSTERIOR REPAIR (RECTOCELE)  Patient Location: PACU  Anesthesia Type:General  Level of Consciousness: drowsy  Airway & Oxygen Therapy: Patient Spontanous Breathing and Patient connected to face mask oxygen  Post-op Assessment: Report given to RN and Post -op Vital signs reviewed and stable  Post vital signs: Reviewed and stable  Last Vitals:  Vitals Value Taken Time  BP 103/68 06/26/21 1016  Temp    Pulse 67 06/26/21 1016  Resp    SpO2 100 % 06/26/21 1016    Last Pain:  Vitals:   06/26/21 0648  TempSrc: Oral  PainSc: 2          Complications: No notable events documented.

## 2021-06-27 DIAGNOSIS — Z79899 Other long term (current) drug therapy: Secondary | ICD-10-CM | POA: Diagnosis not present

## 2021-06-27 DIAGNOSIS — G8918 Other acute postprocedural pain: Secondary | ICD-10-CM | POA: Diagnosis not present

## 2021-06-27 DIAGNOSIS — Z87891 Personal history of nicotine dependence: Secondary | ICD-10-CM | POA: Diagnosis not present

## 2021-06-27 DIAGNOSIS — R339 Retention of urine, unspecified: Secondary | ICD-10-CM | POA: Diagnosis not present

## 2021-06-27 DIAGNOSIS — N8111 Cystocele, midline: Secondary | ICD-10-CM | POA: Diagnosis not present

## 2021-06-27 DIAGNOSIS — N816 Rectocele: Secondary | ICD-10-CM | POA: Diagnosis not present

## 2021-06-27 DIAGNOSIS — N189 Chronic kidney disease, unspecified: Secondary | ICD-10-CM | POA: Diagnosis not present

## 2021-06-27 LAB — CBC
HCT: 27.8 % — ABNORMAL LOW (ref 36.0–46.0)
Hemoglobin: 9.1 g/dL — ABNORMAL LOW (ref 12.0–15.0)
MCH: 31.7 pg (ref 26.0–34.0)
MCHC: 32.7 g/dL (ref 30.0–36.0)
MCV: 96.9 fL (ref 80.0–100.0)
Platelets: 144 10*3/uL — ABNORMAL LOW (ref 150–400)
RBC: 2.87 MIL/uL — ABNORMAL LOW (ref 3.87–5.11)
RDW: 12.6 % (ref 11.5–15.5)
WBC: 4.4 10*3/uL (ref 4.0–10.5)
nRBC: 0 % (ref 0.0–0.2)

## 2021-06-27 LAB — BASIC METABOLIC PANEL
Anion gap: 6 (ref 5–15)
BUN: 8 mg/dL (ref 6–20)
CO2: 29 mmol/L (ref 22–32)
Calcium: 8.5 mg/dL — ABNORMAL LOW (ref 8.9–10.3)
Chloride: 101 mmol/L (ref 98–111)
Creatinine, Ser: 0.57 mg/dL (ref 0.44–1.00)
GFR, Estimated: >60 mL/min (ref >60)
Glucose, Bld: 102 mg/dL — ABNORMAL HIGH (ref 70–99)
Potassium: 3.9 mmol/L (ref 3.5–5.1)
Sodium: 136 mmol/L (ref 135–145)

## 2021-06-27 MED ORDER — OXYCODONE HCL 5 MG PO TABS
10.0000 mg | ORAL_TABLET | ORAL | Status: DC | PRN
  Administered 2021-06-27 – 2021-06-28 (×6): 10 mg via ORAL
  Filled 2021-06-27 (×6): qty 2

## 2021-06-27 MED ORDER — IBUPROFEN 800 MG PO TABS
800.0000 mg | ORAL_TABLET | Freq: Three times a day (TID) | ORAL | Status: DC
  Administered 2021-06-27 (×2): 800 mg via ORAL
  Filled 2021-06-27 (×2): qty 1

## 2021-06-27 MED ORDER — MORPHINE SULFATE (PF) 2 MG/ML IV SOLN: 1.0000 mg | INTRAVENOUS | Status: DC | PRN

## 2021-06-27 MED ORDER — CLONAZEPAM 0.5 MG PO TABS
0.5000 mg | ORAL_TABLET | Freq: Two times a day (BID) | ORAL | Status: DC | PRN
  Administered 2021-06-27 – 2021-06-28 (×3): 0.5 mg via ORAL
  Filled 2021-06-27 (×3): qty 1

## 2021-06-27 MED ORDER — OXYCODONE HCL 5 MG PO TABS
10.0000 mg | ORAL_TABLET | Freq: Four times a day (QID) | ORAL | Status: DC | PRN
  Administered 2021-06-27: 10 mg via ORAL
  Filled 2021-06-27: qty 2

## 2021-06-27 MED ORDER — BETHANECHOL CHLORIDE 10 MG PO TABS
5.0000 mg | ORAL_TABLET | Freq: Three times a day (TID) | ORAL | Status: DC
  Administered 2021-06-27 – 2021-06-28 (×3): 5 mg via ORAL
  Filled 2021-06-27 (×4): qty 0.5

## 2021-06-27 MED ORDER — SIMETHICONE 80 MG PO CHEW
80.0000 mg | CHEWABLE_TABLET | Freq: Four times a day (QID) | ORAL | Status: DC
  Administered 2021-06-27 – 2021-06-28 (×6): 80 mg via ORAL
  Filled 2021-06-27 (×6): qty 1

## 2021-06-27 MED ORDER — CLONAZEPAM 0.5 MG PO TABS: 0.5000 mg | ORAL_TABLET | Freq: Two times a day (BID) | ORAL | Status: DC | PRN

## 2021-06-27 MED ORDER — KETOROLAC TROMETHAMINE 30 MG/ML IJ SOLN
30.0000 mg | Freq: Four times a day (QID) | INTRAMUSCULAR | Status: DC
Start: 2021-06-27 — End: 2021-06-28
  Administered 2021-06-28 (×3): 30 mg via INTRAVENOUS
  Filled 2021-06-27 (×3): qty 1

## 2021-06-27 MED ORDER — ACETAMINOPHEN 500 MG PO TABS
1000.0000 mg | ORAL_TABLET | Freq: Four times a day (QID) | ORAL | Status: DC | PRN
  Administered 2021-06-27 – 2021-06-28 (×3): 1000 mg via ORAL
  Filled 2021-06-27 (×3): qty 2

## 2021-06-27 NOTE — Progress Notes (Signed)
Patient ID: Megan Bentley, female   DOB: Jul 10, 1985, 36 y.o.   MRN: [id number] Pt failed voiding trial today . Catherization of the bladder  Will add Urecholine 5 mg tid to increase tone  If by 9 pm still not passing I will replace foley and rest overnight

## 2021-06-27 NOTE — Progress Notes (Signed)
Foley Catheter placed under Sterile Technique. Pt. Tolerated well. 550cc of clear amber urine out. P. Educated prior to insertion and v/o.

## 2021-06-27 NOTE — Progress Notes (Signed)
1 Day Post-Op Procedure(s) (LRB): HYSTERECTOMY VAGINAL (N/A) BILATERAL SALPINGECTOMY (Bilateral) ANTERIOR (CYSTOCELE) AND POSTERIOR REPAIR (RECTOCELE) (N/A) perineorrhaphy Subjective: Patient reports pain improved on PCA  Vaginal packing in  Po intake ok.    Objective: I have reviewed patient's vital signs, intake and output, medications, and labs.  General: alert and cooperative Resp: clear to auscultation bilaterally Cardio: regular rate and rhythm, S1, S2 normal, no murmur, click, rub or gallop GI: soft, non-tender; bowel sounds normal; no masses,  no organomegaly Vagina: packing removed old blood and 2 small clots  Foley catheter removed  Assessment: s/p Procedure(s): HYSTERECTOMY VAGINAL (N/A) BILATERAL SALPINGECTOMY (Bilateral) ANTERIOR (CYSTOCELE) AND POSTERIOR REPAIR (RECTOCELE) (N/A):  Pain control an issue Plan: D/cPCA and packing and foley . Voiding trial . Po pain meds  If passing voiding trial and pain is adequately controlled I will d/c   LOS: 0 days    Ihor Austin Adriana Quinby 06/27/2021, 11:15 AM

## 2021-06-27 NOTE — Progress Notes (Signed)
Notified Dr. Feliberto Gottron of Pt.'s inability to void and Bladder Scan showing >450cc of Urine in bladder. Order received for Indwelling catheter to be placed and left in over night.

## 2021-06-27 NOTE — Progress Notes (Signed)
Pt. Has >450cc of Urine in bladder after atttempt to void. Secure Chat sent to Dr. Ouida Sills to notify him of Pt.'s inability to void.

## 2021-06-28 DIAGNOSIS — Z87891 Personal history of nicotine dependence: Secondary | ICD-10-CM | POA: Diagnosis not present

## 2021-06-28 DIAGNOSIS — G8918 Other acute postprocedural pain: Secondary | ICD-10-CM | POA: Diagnosis not present

## 2021-06-28 DIAGNOSIS — N8111 Cystocele, midline: Secondary | ICD-10-CM | POA: Diagnosis not present

## 2021-06-28 DIAGNOSIS — Z79899 Other long term (current) drug therapy: Secondary | ICD-10-CM | POA: Diagnosis not present

## 2021-06-28 DIAGNOSIS — R339 Retention of urine, unspecified: Secondary | ICD-10-CM | POA: Diagnosis not present

## 2021-06-28 DIAGNOSIS — N189 Chronic kidney disease, unspecified: Secondary | ICD-10-CM | POA: Diagnosis not present

## 2021-06-28 DIAGNOSIS — N816 Rectocele: Secondary | ICD-10-CM | POA: Diagnosis not present

## 2021-06-28 MED ORDER — BETHANECHOL CHLORIDE 5 MG PO TABS: 5.0000 mg | ORAL_TABLET | Freq: Three times a day (TID) | ORAL | 0 refills | Status: AC

## 2021-06-28 MED ORDER — OXYCODONE-ACETAMINOPHEN 5-325 MG PO TABS: 1.0000 | ORAL_TABLET | Freq: Four times a day (QID) | ORAL | 0 refills | Status: DC | PRN

## 2021-06-28 MED ORDER — GABAPENTIN 300 MG PO CAPS: 300.0000 mg | ORAL_CAPSULE | Freq: Three times a day (TID) | ORAL | 2 refills | Status: DC

## 2021-06-28 NOTE — Progress Notes (Signed)
Pt. Holding small burgundy container in her hand that she states is a"Vape cigarette"    Pt. States she is not using the cigarette but is "holding it because it makes me feel better."  Pt. States she knows she is not supposed to use this in Hospital. Will cont. To follow.

## 2021-06-28 NOTE — Progress Notes (Signed)
Patient educated on discharge instructions, medications and follow up appointments. Patient verbalized understanding. Patient escorted out by auxiliary.

## 2021-06-28 NOTE — Anesthesia Postprocedure Evaluation (Signed)
Anesthesia Post Note  Patient: Megan Bentley  Procedure(s) Performed: HYSTERECTOMY VAGINAL (Vagina ) BILATERAL SALPINGECTOMY (Bilateral: Vagina ) ANTERIOR (CYSTOCELE) AND POSTERIOR REPAIR (RECTOCELE) (Vagina )  Patient location during evaluation: PACU Anesthesia Type: General Level of consciousness: awake and alert Pain management: pain level controlled Vital Signs Assessment: post-procedure vital signs reviewed and stable Respiratory status: spontaneous breathing, nonlabored ventilation, respiratory function stable and patient connected to nasal cannula oxygen Cardiovascular status: blood pressure returned to baseline and stable Postop Assessment: no apparent nausea or vomiting Anesthetic complications: no   No notable events documented.   Last Vitals:  Vitals:   06/28/21 0419 06/28/21 0722  BP: 103/63 105/83  Pulse: 70 67  Resp:  18  Temp:  36.9 C  SpO2:  100%    Last Pain:  Vitals:   06/28/21 1248  TempSrc:   PainSc: 5                  Yevette Edwards

## 2021-06-28 NOTE — Progress Notes (Signed)
Foley removed at 8:30am per Dr. Feliberto Gottron. Patient voided 36mL. Bladder scan complete- 38mL post void.

## 2021-06-28 NOTE — Discharge Summary (Signed)
Physician Discharge Summary  Patient ID: Megan Bentley MRN: [id number] DOB/AGE: 34/34/1987 34 y.o.  Admit date: 06/26/2021 Discharge date: 06/28/2021  Admission Diagnoses:pelvic relaxation   Discharge Diagnoses: urinary retention  Principal Problem:   Postoperative state   Discharged Condition: good  Hospital Course: pt underwent a TVH , Bilateral salpingectomy  Anterior and posterior colporrhaphy , perineoplasty . Poor pain control DOS and needed PCA . Vaginal packing placed . POD#1 failed voiding trial and foley catheter replaced .  POD#2 passed voiding trial after being placed on urecholine   Consults: None  Significant Diagnostic Studies: labs:  Results for orders placed or performed during the hospital encounter of 06/26/21 (from the past 72 hour(s))  I-STAT, chem 8     Status: Abnormal   Collection Time: 06/26/21  6:40 AM  Result Value Ref Range   Sodium 139 135 - 145 mmol/L   Potassium 3.6 3.5 - 5.1 mmol/L   Chloride 105 98 - 111 mmol/L   BUN 12 6 - 20 mg/dL   Creatinine, Ser 5.63 0.44 - 1.00 mg/dL   Glucose, Bld 149 (H) 70 - 99 mg/dL    Comment: Glucose reference range applies only to samples taken after fasting for at least 8 hours.   Calcium, Ion 1.15 1.15 - 1.40 mmol/L   TCO2 25 22 - 32 mmol/L   Hemoglobin 12.6 12.0 - 15.0 g/dL   HCT 70.2 63.7 - 85.8 %  Pregnancy, urine     Status: None   Collection Time: 06/26/21  7:06 AM  Result Value Ref Range   Preg Test, Ur NEGATIVE NEGATIVE    Comment: Performed at [facility], [address], [place], [place] [address]  Urine Drug Screen, Qualitative ([facility] only)     Status: Abnormal   Collection Time: 06/26/21  7:06 AM  Result Value Ref Range   Tricyclic, Ur Screen POSITIVE (A) NONE DETECTED   Amphetamines, Ur Screen POSITIVE (A) NONE DETECTED   MDMA (Ecstasy)Ur Screen NONE DETECTED NONE DETECTED   Cocaine Metabolite,Ur Hyrum NONE DETECTED NONE DETECTED   Opiate, Ur Screen NONE DETECTED NONE DETECTED    Phencyclidine (PCP) Ur S NONE DETECTED NONE DETECTED   Cannabinoid 50 Ng, Ur [place] POSITIVE (A) NONE DETECTED   Barbiturates, Ur Screen NONE DETECTED NONE DETECTED   Benzodiazepine, Ur Scrn NONE DETECTED NONE DETECTED   Methadone Scn, Ur NONE DETECTED NONE DETECTED    Comment: (NOTE) Tricyclics + metabolites, urine    Cutoff 1000 ng/mL Amphetamines + metabolites, urine  Cutoff 1000 ng/mL MDMA (Ecstasy), urine              Cutoff 500 ng/mL Cocaine Metabolite, urine          Cutoff 300 ng/mL Opiate + metabolites, urine        Cutoff 300 ng/mL Phencyclidine (PCP), urine         Cutoff 25 ng/mL Cannabinoid, urine                 Cutoff 50 ng/mL Barbiturates + metabolites, urine  Cutoff 200 ng/mL Benzodiazepine, urine              Cutoff 200 ng/mL Methadone, urine                   Cutoff 300 ng/mL  The urine drug screen provides only a preliminary, unconfirmed analytical test result and should not be used for non-medical purposes. Clinical consideration and professional judgment should be applied to any positive drug screen  result due to possible interfering substances. A more specific alternate chemical method must be used in order to obtain a confirmed analytical result. Gas chromatography / mass spectrometry (GC/MS) is the preferred confirm atory method. Performed at [facility], [address] [place], [place] [address]   Pregnancy, urine POC     Status: None   Collection Time: 06/26/21  7:09 AM  Result Value Ref Range   Preg Test, Ur NEGATIVE NEGATIVE    Comment:        THE SENSITIVITY OF THIS METHODOLOGY IS >24 mIU/mL   Basic metabolic panel     Status: Abnormal   Collection Time: 06/27/21  6:32 AM  Result Value Ref Range   Sodium 136 135 - 145 mmol/L   Potassium 3.9 3.5 - 5.1 mmol/L   Chloride 101 98 - 111 mmol/L   CO2 29 22 - 32 mmol/L   Glucose, Bld 102 (H) 70 - 99 mg/dL    Comment: Glucose reference range applies only to samples taken after fasting for at  least 8 hours.   BUN 8 6 - 20 mg/dL   Creatinine, Ser 1.60 0.44 - 1.00 mg/dL   Calcium 8.5 (L) 8.9 - 10.3 mg/dL   GFR, Estimated >73 >71 mL/min    Comment: (NOTE) Calculated using the CKD-EPI Creatinine Equation (2021)    Anion gap 6 5 - 15    Comment: Performed at [facility], [address] [place], [place] [address]  CBC     Status: Abnormal   Collection Time: 06/27/21  6:32 AM  Result Value Ref Range   WBC 4.4 4.0 - 10.5 K/uL   RBC 2.87 (L) 3.87 - 5.11 MIL/uL   Hemoglobin 9.1 (L) 12.0 - 15.0 g/dL   HCT 48.5 (L) 46.2 - 70.3 %   MCV 96.9 80.0 - 100.0 fL   MCH 31.7 26.0 - 34.0 pg   MCHC 32.7 30.0 - 36.0 g/dL   RDW 50.0 93.8 - 18.2 %   Platelets 144 (L) 150 - 400 K/uL   nRBC 0.0 0.0 - 0.2 %    Comment: Performed at [facility], [address] [place], [place] [address]     Treatments: surgery as above   Discharge Exam: Blood pressure 105/83, pulse 67, temperature 98.4 F (36.9 C), resp. rate 18, height 5\' 3"  (1.6 m), weight 41.3 kg, last menstrual period 06/11/2021, SpO2 100 %. General appearance: alert and cooperative Resp: clear to auscultation bilaterally Cardio: regular rate and rhythm, S1, S2 normal, no murmur, click, rub or gallop  Disposition: d/c home   Discharge Instructions     Call MD for:   Complete by: As directed    Heavy vaginal bleeding . Inability to void or empty bladder   Call MD for:  difficulty breathing, headache or visual disturbances   Complete by: As directed    Call MD for:  extreme fatigue   Complete by: As directed    Call MD for:  hives   Complete by: As directed    Call MD for:  persistant dizziness or light-headedness   Complete by: As directed    Call MD for:  persistant nausea and vomiting   Complete by: As directed    Call MD for:  redness, tenderness, or signs of infection (pain, swelling, redness, odor or green/yellow discharge around incision site)   Complete by: As directed    Call MD for:  severe  uncontrolled pain   Complete by: As directed  Call MD for:  temperature >100.4   Complete by: As directed    Diet - low sodium heart healthy   Complete by: As directed    Increase activity slowly   Complete by: As directed       Allergies as of 06/28/2021       Reactions   Sulfa Antibiotics Anaphylaxis   Molnupiravir Hives, Itching   Amoxicillin Itching, Other (See Comments)   Burning skin   Prednisone Rash        Medication List     STOP taking these medications    acetaminophen 500 MG tablet Commonly known as: TYLENOL   clonazePAM 0.5 MG tablet Commonly known as: KLONOPIN   feeding supplement Liqd   ondansetron 4 MG disintegrating tablet Commonly known as: ZOFRAN-ODT       TAKE these medications    bethanechol 5 MG tablet Commonly known as: Urecholine Take 1 tablet (5 mg total) by mouth 3 (three) times daily for 5 days.   gabapentin 100 MG capsule Commonly known as: NEURONTIN Take 200 mg by mouth 2 (two) times daily. What changed: Another medication with the same name was added. Make sure you understand how and when to take each.   gabapentin 300 MG capsule Commonly known as: Neurontin Take 1 capsule (300 mg total) by mouth 3 (three) times daily for 10 days. What changed: You were already taking a medication with the same name, and this prescription was added. Make sure you understand how and when to take each.   lisdexamfetamine 30 MG capsule Commonly known as: Vyvanse Take 1 capsule (30 mg total) by mouth daily. What changed: Another medication with the same name was removed. Continue taking this medication, and follow the directions you see here.   oxyCODONE-acetaminophen 5-325 MG tablet Commonly known as: Percocet Take 1-2 tablets by mouth every 6 (six) hours as needed for severe pain.        Follow-up Information     Rayden Dock, Ihor Austinhomas J, MD Follow up on 07/15/2021.   Specialty: Obstetrics and Gynecology Why: Post operative follow up  Wednesday, 07/15/2021 @ 9:30am Contact information: [address] [facility] [place] [id number] 302 [phone]                 Signed: Ihor Austinhomas J Marzetta Lanza 06/28/2021, 12:48 PM

## 2021-06-29 ENCOUNTER — Telehealth: Payer: Self-pay

## 2021-06-29 ENCOUNTER — Encounter: Payer: Self-pay | Admitting: Obstetrics and Gynecology

## 2021-06-29 LAB — SURGICAL PATHOLOGY

## 2021-06-29 NOTE — Telephone Encounter (Signed)
Transition Care Management Unsuccessful Follow-up Telephone Call  Date of discharge and from where:  06/28/2021 from [facility]  Attempts:  1st Attempt  Reason for unsuccessful TCM follow-up call:  Left voice message

## 2021-06-30 ENCOUNTER — Encounter: Payer: Self-pay | Admitting: Nurse Practitioner

## 2021-06-30 MED ORDER — OXYCODONE-ACETAMINOPHEN 5-325 MG PO TABS: 1.0000 | ORAL_TABLET | Freq: Four times a day (QID) | ORAL | 0 refills | Status: DC | PRN

## 2021-07-01 NOTE — Telephone Encounter (Signed)
Transition Care Management Unsuccessful Follow-up Telephone Call ° °Date of discharge and from where:  06/28/2021 from [facility] ° °Attempts:  2nd Attempt ° °Reason for unsuccessful TCM follow-up call:  Left voice message ° ° ° °

## 2021-07-02 NOTE — Telephone Encounter (Signed)
Transition Care Management Unsuccessful Follow-up Telephone Call  Date of discharge and from where:  06/28/2021 from [facility]  Attempts:  3rd Attempt  Reason for unsuccessful TCM follow-up call:  Unable to reach patient

## 2021-07-03 DIAGNOSIS — D649 Anemia, unspecified: Secondary | ICD-10-CM | POA: Insufficient documentation

## 2021-07-03 MED ORDER — OXYCODONE-ACETAMINOPHEN 5-325 MG PO TABS: 1.0000 | ORAL_TABLET | Freq: Four times a day (QID) | ORAL | 0 refills | Status: DC | PRN

## 2021-07-03 MED ORDER — LIDOCAINE 5 % EX PTCH: 1.0000 | MEDICATED_PATCH | CUTANEOUS | 0 refills | Status: DC

## 2021-07-03 NOTE — Addendum Note (Signed)
Addended by: Aura Dials T on: 07/03/2021 08:28 AM   Modules accepted: Orders

## 2021-07-03 NOTE — Addendum Note (Signed)
Addended by: Aura Dials T on: 07/03/2021 08:26 AM   Modules accepted: Orders

## 2021-07-04 DIAGNOSIS — Z87448 Personal history of other diseases of urinary system: Secondary | ICD-10-CM | POA: Insufficient documentation

## 2021-07-04 DIAGNOSIS — Z90710 Acquired absence of both cervix and uterus: Secondary | ICD-10-CM | POA: Insufficient documentation

## 2021-07-06 ENCOUNTER — Encounter: Payer: Self-pay | Admitting: Nurse Practitioner

## 2021-07-06 ENCOUNTER — Other Ambulatory Visit: Payer: Self-pay

## 2021-07-06 ENCOUNTER — Ambulatory Visit (INDEPENDENT_AMBULATORY_CARE_PROVIDER_SITE_OTHER): Payer: Medicaid Other | Admitting: Nurse Practitioner

## 2021-07-06 VITALS — BP 95/60 | HR 85 | Temp 98.2°F | Ht 63.0 in | Wt 92.2 lb

## 2021-07-06 DIAGNOSIS — D649 Anemia, unspecified: Secondary | ICD-10-CM | POA: Diagnosis not present

## 2021-07-06 DIAGNOSIS — F32A Depression, unspecified: Secondary | ICD-10-CM | POA: Diagnosis not present

## 2021-07-06 DIAGNOSIS — F419 Anxiety disorder, unspecified: Secondary | ICD-10-CM | POA: Diagnosis not present

## 2021-07-06 DIAGNOSIS — Z87448 Personal history of other diseases of urinary system: Secondary | ICD-10-CM | POA: Diagnosis not present

## 2021-07-06 DIAGNOSIS — K649 Unspecified hemorrhoids: Secondary | ICD-10-CM | POA: Insufficient documentation

## 2021-07-06 DIAGNOSIS — F902 Attention-deficit hyperactivity disorder, combined type: Secondary | ICD-10-CM | POA: Diagnosis not present

## 2021-07-06 DIAGNOSIS — Z90710 Acquired absence of both cervix and uterus: Secondary | ICD-10-CM

## 2021-07-06 DIAGNOSIS — K641 Second degree hemorrhoids: Secondary | ICD-10-CM

## 2021-07-06 DIAGNOSIS — E440 Moderate protein-calorie malnutrition: Secondary | ICD-10-CM | POA: Diagnosis not present

## 2021-07-06 MED ORDER — OXYCODONE-ACETAMINOPHEN 5-325 MG PO TABS: 1.0000 | ORAL_TABLET | Freq: Four times a day (QID) | ORAL | 0 refills | Status: AC | PRN

## 2021-07-06 MED ORDER — HYDROCORTISONE (PERIANAL) 2.5 % EX CREA: 1.0000 "application " | TOPICAL_CREAM | Freq: Two times a day (BID) | CUTANEOUS | 0 refills | Status: DC

## 2021-07-06 NOTE — Assessment & Plan Note (Signed)
At this time overall is gradually improving with some lingering pain, will send in one more refill on Norco for 3 day supply only.  To use Lidocaine patches as needed and transition to these + alternating Ibuprofen and Tylenol + Baclofen.  She is aware not to take Norco at same time as Klonopin.

## 2021-07-06 NOTE — Assessment & Plan Note (Signed)
Recent repair, refer to post-op plan of care.

## 2021-07-06 NOTE — Assessment & Plan Note (Signed)
Chronic, ongoing, followed by neurology with Klonopin ordered.  She is aware of long term risks of chronic benzo use and aware this script needs to come from neurology, as they initiated this treatment.  At this time discussed with her need to reduce to discontinue this script as she wishes to continue on medication for her ADHD.  A verbal agreement was made to work on this reduction to discontinuation while initiating Vyvanse in future.  Controlled substance contract and UDS up to date -- had at length discussion with her about no MJ use.

## 2021-07-06 NOTE — Patient Instructions (Signed)
Anemia °Anemia is a condition in which there is not enough red blood cells or hemoglobin in the blood. Hemoglobin is a substance in red blood cells that carries oxygen. °When you do not have enough red blood cells or hemoglobin (are anemic), your body cannot get enough oxygen and your organs may not work properly. As a result, you may feel very tired or have other problems. °What are the causes? °Common causes of anemia include: °Excessive bleeding. Anemia can be caused by excessive bleeding inside or outside the body, including bleeding from the intestines or from heavy menstrual periods in females. °Poor nutrition. °Long-lasting (chronic) kidney, thyroid, and liver disease. °Bone marrow disorders, spleen problems, and blood disorders. °Cancer and treatments for cancer. °HIV (human immunodeficiency virus) and AIDS (acquired immunodeficiency syndrome). °Infections, medicines, and autoimmune disorders that destroy red blood cells. °What are the signs or symptoms? °Symptoms of this condition include: °Minor weakness. °Dizziness. °Headache, or difficulties concentrating and sleeping. °Heartbeats that feel irregular or faster than normal (palpitations). °Shortness of breath, especially with exercise. °Pale skin, lips, and nails, or cold hands and feet. °Indigestion and nausea. °Symptoms may occur suddenly or develop slowly. If your anemia is mild, you may not have symptoms. °How is this diagnosed? °This condition is diagnosed based on blood tests, your medical history, and a physical exam. In some cases, a test may be needed in which cells are removed from the soft tissue inside of a bone and looked at under a microscope (bone marrow biopsy). Your health care provider may also check your stool (feces) for blood and may do additional testing to look for the cause of your bleeding. °Other tests may include: °Imaging tests, such as a CT scan or MRI. °A procedure to see inside your esophagus and stomach (endoscopy). °A  procedure to see inside your colon and rectum (colonoscopy). °How is this treated? °Treatment for this condition depends on the cause. If you continue to lose a lot of blood, you may need to be treated at a hospital. Treatment may include: °Taking supplements of iron, vitamin B12, or folic acid. °Taking a hormone medicine (erythropoietin) that can help to stimulate red blood cell growth. °Having a blood transfusion. This may be needed if you lose a lot of blood. °Making changes to your diet. °Having surgery to remove your spleen. °Follow these instructions at home: °Take over-the-counter and prescription medicines only as told by your health care provider. °Take supplements only as told by your health care provider. °Follow any diet instructions that you were given by your health care provider. °Keep all follow-up visits as told by your health care provider. This is important. °Contact a health care provider if: °You develop new bleeding anywhere in the body. °Get help right away if: °You are very weak. °You are short of breath. °You have pain in your abdomen or chest. °You are dizzy or feel faint. °You have trouble concentrating. °You have bloody stools, black stools, or tarry stools. °You vomit repeatedly or you vomit up blood. °These symptoms may represent a serious problem that is an emergency. Do not wait to see if the symptoms will go away. Get medical help right away. Call your local emergency services (911 in the [place] Do not drive yourself to the hospital. °Summary °Anemia is a condition in which you do not have enough red blood cells or enough of a substance in your red blood cells that carries oxygen (hemoglobin). °Symptoms may occur suddenly or develop slowly. °If your anemia is   mild, you may not have symptoms. °This condition is diagnosed with blood tests, a medical history, and a physical exam. Other tests may be needed. °Treatment for this condition depends on the cause of the anemia. °This  information is not intended to replace advice given to you by your health care provider. Make sure you discuss any questions you have with your health care provider. °Document Revised: 05/08/2019 Document Reviewed: 05/08/2019 °Elsevier Patient Education © 2022 [employer] ° °

## 2021-07-06 NOTE — Assessment & Plan Note (Signed)
Ongoing, underweight, but has gained 2 pounds.  Continue supplements at home and monitor weight closely.  Denies any purging/binging or restrictions.

## 2021-07-06 NOTE — Assessment & Plan Note (Addendum)
Noted on labs post-op, recheck today CBC, BMP, iron, ferritin.  May need supplement added if ongoing.  Discussed with patient.

## 2021-07-06 NOTE — Assessment & Plan Note (Signed)
Improved with medication, at this time is holding it due to need for pain medications post op.  Have discussed at length with her risks with use of Klonopin and Vyvanse -- at this time discussed with her need to reduce to discontinue this script (Klonopin) slow reduction and maintain Vyvanse.  A verbal agreement was made to work on this reduction to discontinuation while initiating Vyvanse.  Controlled substance contract and UDS up to date.  She is to return in 3 months for follow-up -- will monitor Klonopin use with goal of discontinuation of this.

## 2021-07-06 NOTE — Assessment & Plan Note (Signed)
Noted on exam -- she sent provider photos of them via MyChart. To reach out to surgery who she is referred to about treatment.  Anusol ordered today.

## 2021-07-06 NOTE — Progress Notes (Signed)
BP 95/60    Pulse 85    Temp 98.2 F (36.8 C) (Oral)    Ht 5\' 3"  (1.6 m)    Wt 92 lb 3.2 oz (41.8 kg)    LMP 06/11/2021 (Approximate)    SpO2 98%    BMI 16.33 kg/m    Subjective:    Patient ID: 06/13/2021, female    DOB: Feb 11, 1986, 36 y.o.   MRN: [id number]  HPI: Megan Bentley is a 36 y.o. female  Chief Complaint  Patient presents with   ADHD   Depression   Anxiety   Post-op Problem    Patient states she is still experiencing pain after her Hysterectomy and states her surgeon could prescribe her medication but she would have to have wait 72 hours before it being approved. Patient states she woke up this morning with a lot of pain and states she took the last of her medication today.    Hemorrhoids    Patient states her Hemorrhoids are not gone back in since her MyChart message. Patient states the area is still irritated and bothers her to sit and do a lot of walking.    HEMORRHOIDS Hemorrhoids were thrombosed last night, has referral to surgery at [facility] -- they were to band during her recent surgery, but they held off on this.  She is going to call to schedule with them.  She reports they have gone back in now.  Duration: chronic Anal fullness: yes Perianal itching/irritation: none Perianal pain: no Bright red rectal bleeding: no Amount of blood: none Frequency: intermittent Constipation: no Hard stools: no Chronic straining/valsava: no Alleviating factors: stool softeners Aggravating factors: none Status: stable Treatments attempted: nothing  Previous hemorrhoids: yes  Colonoscopy: no   POST-OP PAIN Continues to have post-op pain -- 06/26/2021 == HYSTERECTOMY VAGINAL & BILATERAL SALPINGECTOMY -- bladder sling as well with Dr. 06/28/2021.  She is very frustrated as in lock in program through [employer], which she is working on.  PCP currently prescribing post-op pain medications -- last fill 07/03/21, was given 3 day supply on review, she is taking every 4 hours +  Tylenol.  Has not picked Lidocaine patches yet -- is picking them up today.  Did have some post-op anemia noted on labs.    DEPRESSION/ADHD/ANXIETY Follow-up for mood today.  She continues in medication lock in program through 07/05/21 where it is stating she can only have two providers prescribing controlled substances -- PCP and neurology.  Lock in for two years she reports.   Taking Vyvanse for ADHD, as she is starting back to school soon.     Continues on Klonopin (last fill 07/02/20) as ordered by neurology, however we discussed at length last visit goal to work on discontinuing this over next few months due to having Vyvanse on board (last fill 06/11/21) -- she has not been using much at this time due to anxiety, she is using Klonopin more at this time. She reports if she takes Vyvanse for school she would not take Klonopin anymore -- that is her goal.     She is also followed by neurology for migraines and continues on Pamelor and Gabapentin. Last visit on 03/11/21. Mood status: controlled Satisfied with current treatment?: yes Symptom severity: mild  Duration of current treatment : chronic Side effects: no Medication compliance: good compliance Psychotherapy/counseling: none Previous psychiatric medications: multiple medications Depressed mood: no Anxious mood: no Anhedonia: no Significant weight loss or gain: no Insomnia: none Fatigue: no Feelings  of worthlessness or guilt: no Impaired concentration/indecisiveness: no Suicidal ideations: no Hopelessness: no Crying spells: no Depression screen [facility] 2/9 07/06/2021 05/11/2021 06/26/2020 05/13/2020 04/10/2020  Decreased Interest 0 1 1 1 2   Down, Depressed, Hopeless 0 1 1 1 1   PHQ - 2 Score 0 2 2 2 3   Altered sleeping 0 0 1 1 1   Tired, decreased energy 3 1 1 1 3   Change in appetite 1 1 1  0 0  Feeling bad or failure about yourself  0 0 1 1 1   Trouble concentrating 0 0 0 3 2  Moving slowly or fidgety/restless 0 0 0 1 0  Suicidal  thoughts 0 0 0 0 0  PHQ-9 Score 4 4 6 9 10   Difficult doing work/chores Not difficult at all Somewhat difficult - Somewhat difficult Not difficult at all    GAD 7 : Generalized Anxiety Score 07/06/2021 05/11/2021 05/13/2020 04/10/2020  Nervous, Anxious, on Edge 2 3 3 3   Control/stop worrying 3 3 3 2   Worry too much - different things 3 3 3 3   Trouble relaxing 2 3 3 3   Restless 3 2 1 3   Easily annoyed or irritable 1 3 1 3   Afraid - awful might happen 3 3 1 2   Total GAD 7 Score 17 20 15 19   Anxiety Difficulty Somewhat difficult Somewhat difficult Somewhat difficult Somewhat difficult   Relevant past medical, surgical, family and social history reviewed and updated as indicated. Interim medical history since our last visit reviewed. Allergies and medications reviewed and updated.  Review of Systems  Constitutional:  Negative for activity change, appetite change, diaphoresis, fatigue and fever.  Respiratory:  Negative for cough, chest tightness and shortness of breath.   Cardiovascular:  Negative for chest pain, palpitations and leg swelling.  Gastrointestinal: Negative.   Neurological: Negative.   Psychiatric/Behavioral:  Positive for decreased concentration. Negative for self-injury, sleep disturbance and suicidal ideas. The patient is nervous/anxious.    Per HPI unless specifically indicated above     Objective:    BP 95/60    Pulse 85    Temp 98.2 F (36.8 C) (Oral)    Ht 5\' 3"  (1.6 m)    Wt 92 lb 3.2 oz (41.8 kg)    LMP 06/11/2021 (Approximate)    SpO2 98%    BMI 16.33 kg/m   Wt Readings from Last 3 Encounters:  07/06/21 92 lb 3.2 oz (41.8 kg)  06/26/21 91 lb (41.3 kg)  05/19/21 90 lb (40.8 kg)    Physical Exam Vitals and nursing note reviewed.  Constitutional:      General: She is awake. She is not in acute distress.    Appearance: She is well-developed and well-groomed. She is not ill-appearing or toxic-appearing.  HENT:     Head: Normocephalic.     Right Ear: Hearing,  tympanic membrane, ear canal and external ear normal.     Left Ear: Hearing, tympanic membrane, ear canal and external ear normal.  Eyes:     General: Lids are normal.        Right eye: No discharge.        Left eye: No discharge.     Extraocular Movements: Extraocular movements intact.     Conjunctiva/sclera: Conjunctivae normal.     Pupils: Pupils are equal, round, and reactive to light.     Visual Fields: Right eye visual fields normal and left eye visual fields normal.  Neck:     Thyroid: No thyromegaly.  Vascular: No carotid bruit or JVD.  Cardiovascular:     Rate and Rhythm: Normal rate and regular rhythm.     Heart sounds: Normal heart sounds. No murmur heard.   No gallop.  Pulmonary:     Effort: Pulmonary effort is normal.     Breath sounds: Normal breath sounds.  Abdominal:     General: Bowel sounds are normal.     Palpations: Abdomen is soft.  Musculoskeletal:     Cervical back: Normal range of motion and neck supple.     Right lower leg: No edema.     Left lower leg: No edema.  Lymphadenopathy:     Cervical: No cervical adenopathy.  Skin:    General: Skin is warm and dry.  Neurological:     Mental Status: She is alert and oriented to person, place, and time.     Sensory: Sensation is intact.     Motor: Motor function is intact.     Coordination: Coordination is intact.     Gait: Gait is intact.     Deep Tendon Reflexes: Reflexes are normal and symmetric.     Reflex Scores:      Brachioradialis reflexes are 2+ on the right side and 2+ on the left side.      Patellar reflexes are 2+ on the right side and 2+ on the left side. Psychiatric:        Attention and Perception: Attention normal.        Mood and Affect: Mood normal.        Speech: Speech normal.        Behavior: Behavior normal. Behavior is cooperative.        Thought Content: Thought content normal.    Results for orders placed or performed during the hospital encounter of 06/26/21  Pregnancy,  urine  Result Value Ref Range   Preg Test, Ur NEGATIVE NEGATIVE  Urine Drug Screen, Qualitative ([facility] only)  Result Value Ref Range   Tricyclic, Ur Screen POSITIVE (A) NONE DETECTED   Amphetamines, Ur Screen POSITIVE (A) NONE DETECTED   MDMA (Ecstasy)Ur Screen NONE DETECTED NONE DETECTED   Cocaine Metabolite,Ur [place] NONE DETECTED NONE DETECTED   Opiate, Ur Screen NONE DETECTED NONE DETECTED   Phencyclidine (PCP) Ur S NONE DETECTED NONE DETECTED   Cannabinoid 50 Ng, Ur [place] POSITIVE (A) NONE DETECTED   Barbiturates, Ur Screen NONE DETECTED NONE DETECTED   Benzodiazepine, Ur Scrn NONE DETECTED NONE DETECTED   Methadone Scn, Ur NONE DETECTED NONE DETECTED  Basic metabolic panel  Result Value Ref Range   Sodium 136 135 - 145 mmol/L   Potassium 3.9 3.5 - 5.1 mmol/L   Chloride 101 98 - 111 mmol/L   CO2 29 22 - 32 mmol/L   Glucose, Bld 102 (H) 70 - 99 mg/dL   BUN 8 6 - 20 mg/dL   Creatinine, Ser 4.09 0.44 - 1.00 mg/dL   Calcium 8.5 (L) 8.9 - 10.3 mg/dL   GFR, Estimated >81 >19 mL/min   Anion gap 6 5 - 15  CBC  Result Value Ref Range   WBC 4.4 4.0 - 10.5 K/uL   RBC 2.87 (L) 3.87 - 5.11 MIL/uL   Hemoglobin 9.1 (L) 12.0 - 15.0 g/dL   HCT 14.7 (L) 82.9 - 56.2 %   MCV 96.9 80.0 - 100.0 fL   MCH 31.7 26.0 - 34.0 pg   MCHC 32.7 30.0 - 36.0 g/dL   RDW 13.0 86.5 - 78.4 %  Platelets 144 (L) 150 - 400 K/uL   nRBC 0.0 0.0 - 0.2 %  I-STAT, chem 8  Result Value Ref Range   Sodium 139 135 - 145 mmol/L   Potassium 3.6 3.5 - 5.1 mmol/L   Chloride 105 98 - 111 mmol/L   BUN 12 6 - 20 mg/dL   Creatinine, Ser 4.780.70 0.44 - 1.00 mg/dL   Glucose, Bld 295103 (H) 70 - 99 mg/dL   Calcium, Ion 6.211.15 3.081.15 - 1.40 mmol/L   TCO2 25 22 - 32 mmol/L   Hemoglobin 12.6 12.0 - 15.0 g/dL   HCT 65.737.0 84.636.0 - 96.246.0 %  Pregnancy, urine POC  Result Value Ref Range   Preg Test, Ur NEGATIVE NEGATIVE  Surgical pathology  Result Value Ref Range   SURGICAL PATHOLOGY      SURGICAL PATHOLOGY CASE: [id number] PATIENT:  Megan Bentley Surgical Pathology Report     Specimen Submitted: A. Uterus, cervix, billateral fallopian tubes  Clinical History: Pelvic organ prolapse     DIAGNOSIS: A. UTERUS WITH CERVIX; HYSTERECTOMY: - CERVIX WITHOUT PATHOLOGIC CHANGES. - WEAKLY PROLIFERATIVE ENDOMETRIUM. - MYOMETRIUM WITHOUT PATHOLOGIC CHANGES.  BILATERAL FALLOPIAN TUBES; SALPINGECTOMY: - TWO DISTAL FALLOPIAN TUBE SEGMENTS WITH FIMBRIA.  GROSS DESCRIPTION: A. Labeled: Uterus, cervix and bilateral fallopian tubes Received: Fresh Collection time: 8:25 AM on 06/26/2021 Placed into formalin time: 9:03 AM on 06/26/2021 Weight: 92.2 grams Dimensions:      Fundus -9.5 (superior to inferior) by 5.0 (breadth of uterus at fundus) by 3.4 (anterior to posterior) cm      Cervix -3.3 x 3.0 cm with a 1.5 x 0.4 cm slitlike os Serosa: Pink and glistening Cervix: Tan-pink and smooth Endocervix: Pink, mucoid, and finely granular Endometrial cavity:       Dimensions -3.0 x 2.5 cm      Thickness -0.2 cm      Other findings -the endometrium is pink and smooth Myometrium:     Thickness -1.8 cm     Other findings -the myometrium is pink and smooth Adnexa: Also received are 2 fragments of pink, unremarkable fimbriae, 1.2 x 1.0 cm and 1.5 x 1.2 cm.           Other comments: None noted  Block summary: 1 -anterior cervix and endocervix 2-posterior cervix and endocervix 3-anterior endomyometrium 4-posterior endomyometrium 5-one fimbria fragment, bisected 6-one fimbria fragment, bisected  [facility]  Final Diagnosis performed by Ronald LoboMary Olney, MD.   Electronically signed 06/29/2021 4:49:27PM The electronic signature indicates that the named Attending Pathologist has evaluated the specimen Technical component performed at [facility], [address], [place], [place] [address] Lab: [phone] Dir: Mihaela Fajardo SchimkeSanjai Nagendra, MD, MMM  Professional component performed at [facility], [facility], [address],  [place], [place] [address] Lab: [phone] Dir: Beryle QuantHeath M. Jones, MD       Assessment & Plan:   Problem List Items Addressed This Visit       Cardiovascular and Mediastinum   Hemorrhoids    Noted on exam -- she sent provider photos of them via MyChart. To reach out to surgery who she is referred to about treatment.  Anusol ordered today.        Other   ADHD    Improved with medication, at this time is holding it due to need for pain medications post op.  Have discussed at length with her risks with use of Klonopin and Vyvanse -- at this time discussed with her need to reduce to discontinue this script (Klonopin) slow reduction  and maintain Vyvanse.  A verbal agreement was made to work on this reduction to discontinuation while initiating Vyvanse.  Controlled substance contract and UDS up to date.  She is to return in 3 months for follow-up -- will monitor Klonopin use with goal of discontinuation of this.        Anxiety and depression    Chronic, ongoing, followed by neurology with Klonopin ordered.  She is aware of long term risks of chronic benzo use and aware this script needs to come from neurology, as they initiated this treatment.  At this time discussed with her need to reduce to discontinue this script as she wishes to continue on medication for her ADHD.  A verbal agreement was made to work on this reduction to discontinuation while initiating Vyvanse in future.  Controlled substance contract and UDS up to date -- had at length discussion with her about no MJ use.      History of cystocele    Recent repair, refer to post-op plan of care.      Relevant Orders   Basic metabolic panel   CBC with Differential/Platelet   Iron Binding Cap (TIBC)([facility])   Ferritin   Postoperative anemia    Noted on labs post-op, recheck today CBC, BMP, iron, ferritin.  May need supplement added if ongoing.  Discussed with patient.      Relevant Orders   Basic metabolic panel   CBC with  Differential/Platelet   Iron Binding Cap (TIBC)([facility])   Ferritin   Protein-calorie malnutrition, moderate (HCC) - Primary    Ongoing, underweight, but has gained 2 pounds.  Continue supplements at home and monitor weight closely.  Denies any purging/binging or restrictions.        S/P vaginal hysterectomy    At this time overall is gradually improving with some lingering pain, will send in one more refill on Norco for 3 day supply only.  To use Lidocaine patches as needed and transition to these + alternating Ibuprofen and Tylenol + Baclofen.  She is aware not to take Norco at same time as Klonopin.        Relevant Orders   Basic metabolic panel   CBC with Differential/Platelet   Iron Binding Cap (TIBC)([facility])   Ferritin     Follow up plan: Return in about 4 weeks (around 08/03/2021) for Anemia and Mood + hemorrhoids.

## 2021-07-07 ENCOUNTER — Encounter: Payer: Self-pay | Admitting: Nurse Practitioner

## 2021-07-07 ENCOUNTER — Other Ambulatory Visit: Payer: Self-pay | Admitting: Nurse Practitioner

## 2021-07-07 LAB — CBC WITH DIFFERENTIAL/PLATELET
Basophils Absolute: 0 10*3/uL (ref 0.0–0.2)
Basos: 0 %
EOS (ABSOLUTE): 0.2 10*3/uL (ref 0.0–0.4)
Eos: 3 %
Hematocrit: 32.2 % — ABNORMAL LOW (ref 34.0–46.6)
Hemoglobin: 10.5 g/dL — ABNORMAL LOW (ref 11.1–15.9)
Immature Grans (Abs): 0.1 10*3/uL (ref 0.0–0.1)
Immature Granulocytes: 1 %
Lymphocytes Absolute: 1.6 10*3/uL (ref 0.7–3.1)
Lymphs: 23 %
MCH: 31.5 pg (ref 26.6–33.0)
MCHC: 32.6 g/dL (ref 31.5–35.7)
MCV: 97 fL (ref 79–97)
Monocytes Absolute: 0.5 10*3/uL (ref 0.1–0.9)
Monocytes: 7 %
Neutrophils Absolute: 4.6 10*3/uL (ref 1.4–7.0)
Neutrophils: 66 %
Platelets: 324 10*3/uL (ref 150–450)
RBC: 3.33 x10E6/uL — ABNORMAL LOW (ref 3.77–5.28)
RDW: 11.9 % (ref 11.7–15.4)
WBC: 7 10*3/uL (ref 3.4–10.8)

## 2021-07-07 LAB — IRON AND TIBC
Iron Saturation: 11 % — ABNORMAL LOW (ref 15–55)
Iron: 36 ug/dL (ref 27–159)
Total Iron Binding Capacity: 326 ug/dL (ref 250–450)
UIBC: 290 ug/dL (ref 131–425)

## 2021-07-07 LAB — FERRITIN: Ferritin: 51 ng/mL (ref 15–150)

## 2021-07-07 MED ORDER — IRON-VITAMIN C 65-125 MG PO TABS: 1.0000 | ORAL_TABLET | Freq: Every day | ORAL | 12 refills | Status: DC

## 2021-07-07 MED ORDER — TRAMADOL HCL 50 MG PO TABS: 25.0000 mg | ORAL_TABLET | Freq: Four times a day (QID) | ORAL | 0 refills | Status: DC | PRN

## 2021-07-07 NOTE — Progress Notes (Signed)
Contacted via MyChart   Good morning Megan Bentley, your labs have returned: - CBC continues to show some mild anemia -- although some improvement.  Iron level is on low side of normal.  If you are not taking supplement at home, I recommend we start one.  I will send this in for you to take once a day for now and we will recheck labs next visit.  I suspect this will improve with a little iron and is more related to recent surgery and blood loss with that.  Any questions? Keep being amazing!!  Thank you for allowing me to participate in your care.  I appreciate you. Kindest regards, Jahir Halt

## 2021-07-08 LAB — BASIC METABOLIC PANEL
BUN/Creatinine Ratio: 15 (ref 9–23)
BUN: 12 mg/dL (ref 6–20)
CO2: 21 mmol/L (ref 20–29)
Calcium: 9.6 mg/dL (ref 8.7–10.2)
Chloride: 102 mmol/L (ref 96–106)
Creatinine, Ser: 0.79 mg/dL (ref 0.57–1.00)
Glucose: 86 mg/dL (ref 70–99)
Potassium: 4.9 mmol/L (ref 3.5–5.2)
Sodium: 141 mmol/L (ref 134–144)
eGFR: 101 mL/min/{1.73_m2} (ref >59)

## 2021-07-08 MED ORDER — TRAMADOL HCL 50 MG PO TABS
25.0000 mg | ORAL_TABLET | Freq: Four times a day (QID) | ORAL | 0 refills | Status: DC | PRN
Start: 2021-07-08 — End: 2021-07-13

## 2021-07-08 NOTE — Addendum Note (Signed)
Addended by: Aura Dials T on: 07/08/2021 08:04 AM   Modules accepted: Orders

## 2021-07-08 NOTE — Progress Notes (Signed)
Contacted via [place] morning Megan Bentley, your labs have returned and overall kidney function is stable.:)

## 2021-07-13 ENCOUNTER — Ambulatory Visit: Payer: Medicaid Other | Admitting: Nurse Practitioner

## 2021-07-13 MED ORDER — TRAMADOL HCL 50 MG PO TABS: 25.0000 mg | ORAL_TABLET | Freq: Four times a day (QID) | ORAL | 0 refills | Status: AC | PRN

## 2021-07-13 MED ORDER — LISDEXAMFETAMINE DIMESYLATE 30 MG PO CAPS: 30.0000 mg | ORAL_CAPSULE | Freq: Every day | ORAL | 0 refills | Status: DC

## 2021-07-13 NOTE — Addendum Note (Signed)
Addended by: Aura Dials T on: 07/13/2021 09:42 AM   Modules accepted: Orders

## 2021-07-15 ENCOUNTER — Ambulatory Visit: Payer: Self-pay | Admitting: Surgery

## 2021-07-15 DIAGNOSIS — K641 Second degree hemorrhoids: Secondary | ICD-10-CM | POA: Diagnosis not present

## 2021-07-15 DIAGNOSIS — Z419 Encounter for procedure for purposes other than remedying health state, unspecified: Secondary | ICD-10-CM | POA: Diagnosis not present

## 2021-07-15 NOTE — H&P (Signed)
Subjective: CC: Grade II hemorrhoids [K64.1]   HPI:  Megan Bentley is a 36 y.o. female who returns for above. Symptoms were first noted several years ago. Progressively worse with monthly symptoms.  she has some rectal bleeding occurring. Bleeding is described as just notes blood on tissue paper. Alleviated by sitz baths and witch hazel. Minimal discomfort with flare.  Wanted to postpone surgery after initial consult but now ready to proceed.  Toilet habits: Once ever few days    Past Medical History:  has a past medical history of Anemia, Anxiety, Anxiety disorder (11/27/2019), Arthritis (September 21 2016), Chronic kidney disease, Preeclampsia, Tobacco abuse (09/03/2016), and Vaginal candidiasis (11/27/2019).  Past Surgical History:  has a past surgical history that includes Kidney stone removal and wisdom tooth (Bilateral).  Family History: family history includes Anxiety in her father, maternal grandmother, and mother; Arthritis in her maternal grandmother and paternal grandmother; Coronary Artery Disease (Blocked arteries around heart) in her maternal grandfather, maternal grandmother, and paternal grandfather; Deep vein thrombosis (DVT or abnormal blood clot formation) in her father; High blood pressure (Hypertension) in her father, maternal grandmother, and mother; Hyperlipidemia (Elevated cholesterol) in her father and maternal grandmother; Multiple sclerosis in her mother and sister; Osteoarthritis in her father; Sjogren's syndrome in her father; Skin cancer in her maternal grandfather; Uterine cancer in her paternal grandmother.  Social History:  reports that she has quit smoking. Her smoking use included cigarettes. She has a 16.00 pack-year smoking history. She has never used smokeless tobacco. She reports that she does not drink alcohol and does not use drugs.  Current Medications: has a current medication list which includes the following prescription(s): clonazepam, gabapentin,  ibuprofen, ondansetron, vyvanse, cyclobenzaprine, diphenhydramine, fluoxetine, gabapentin, metoclopramide, nortriptyline, ondansetron, ondansetron, oxycodone-acetaminophen, pediatric multivitamin, polyethylene glycol, propranolol, quetiapine, solifenacin, and ubrogepant.  Allergies:  Allergies as of 07/15/2021 - Reviewed 07/15/2021 Allergen Reaction Noted  Sulfa (sulfonamide antibiotics) Anaphylaxis 04/20/2015  Molnupiravir Hives and Itching 04/20/2021  Amoxicillin Itching and Other (See Comments) 08/27/2020  Other Hives 05/28/2021  Prednisone Rash 04/18/2021   ROS:  A 15 point review of systems was performed and pertinent positives and negatives noted in HPI  Objective:    BP 108/55    Pulse 86    Ht 162.6 cm (5\' 4" )    Wt (!) 41.3 kg (91 lb)    LMP 05/21/2021    BMI 15.62 kg/m   Constitutional :  alert, appears stated age, cooperative and no distress Lymphatics/Throat::  no asymmetry, masses, or scars Respiratory:  clear to auscultation bilaterally Cardiovascular:  regular rate and rhythm Gastrointestinal: soft, non-tender; bowel sounds normal; no masses,  no organomegaly.   Musculoskeletal: Steady gait and movement Skin: Cool and moist Psychiatric: Normal affect, non-agitated, not confused Genital/Rectal: Deferred for today    LABS:  n/a   RADS: n/a Assessment:    Grade II hemorrhoids [K64.1]  Plan:   1. Grade II hemorrhoids [K64.1] Discussed risks/benefits/alternatives to surgery.  Alternatives include the options of observation, medical management.  Benefits include symptomatic relief.  I discussed  in detail and the complications related to the operation and the anesthesia, including bleeding, infection, recurrence, remote possibility of temporary or permanent fecal incontinence, poor/delayed wound healing, chronic pain, and additional procedures to address said risks. The risks of general anesthetic, if used, includes MI, CVA, sudden death or even reaction to  anesthetic medications also discussed.   We also discussed typical post operative recovery which includes weeks to potentially months of anal pain, drainage,  occasional bleeding, and sense of fecal urgency.    ED return precautions given for sudden increase in pain, bleeding, with possible accompanying fever, nausea, and/or vomiting.  The patient understands the risks, any and all questions were answered to the patient's satisfaction.  2. Patient has elected to proceed with surgical treatment. Procedure will be scheduled.

## 2021-07-20 ENCOUNTER — Ambulatory Visit: Payer: Medicaid Other | Admitting: Nurse Practitioner

## 2021-07-20 NOTE — Telephone Encounter (Signed)
Patient called to say that she called the surgeon's office who performed the hysterectomy and is waiting for a return call. She asked does she still come in for the appointment today because she doesn't want to go to the ED as recommended by [facility] and sit their all day, she has her children with her. I called and spoke to [place], [facility] who says that the message is clear to follow up with provider who performed hysterectomy or go to the ED, her appointment at 1120 today is being cancelled. I advised the patient, she says she is not going to the ED, so she will just wait to see what the surgeon does. She says she thinks she has an infection and wanted some blood work done. I advised to follow the recommendations of [name].

## 2021-07-21 DIAGNOSIS — R300 Dysuria: Secondary | ICD-10-CM | POA: Diagnosis not present

## 2021-07-21 DIAGNOSIS — R102 Pelvic and perineal pain: Secondary | ICD-10-CM | POA: Diagnosis not present

## 2021-07-21 DIAGNOSIS — G8918 Other acute postprocedural pain: Secondary | ICD-10-CM | POA: Diagnosis not present

## 2021-07-27 ENCOUNTER — Encounter
Admission: RE | Admit: 2021-07-27 | Discharge: 2021-07-27 | Disposition: A | Discharge status: Home / Self Care | Payer: Medicaid Other | Source: Ambulatory Visit | Attending: Surgery | Admitting: Surgery

## 2021-07-27 ENCOUNTER — Encounter: Payer: Self-pay | Admitting: *Deleted

## 2021-07-27 NOTE — Patient Instructions (Signed)
Your procedure is scheduled on:07-30-21 Thursday Report to the Registration Desk on the 1st floor of the [facility].Then proceed to the 2nd floor Surgery Desk in the Medical [facility] To find out your arrival time, please call [phone] between 1PM - 3PM on:07-29-21 Wednesday  REMEMBER: Instructions that are not followed completely may result in serious medical risk, up to and including death; or upon the discretion of your surgeon and anesthesiologist your surgery may need to be rescheduled.  Do not eat food after midnight the night before surgery.  No gum chewing, lozengers or hard candies.  You may however, drink CLEAR liquids up to 2 hours before you are scheduled to arrive for your surgery. Do not drink anything within 2 hours of your scheduled arrival time.  Clear liquids include: - water  - apple juice without pulp - gatorade (not RED, PURPLE, OR BLUE) - black coffee or tea (Do NOT add milk or creamers to the coffee or tea) Do NOT drink anything that is not on this list.  TAKE THESE MEDICATIONS THE MORNING OF SURGERY WITH A SIP OF WATER: -clonazePAM (KLONOPIN)   One week prior to surgery: Stop Anti-inflammatories (NSAIDS) such as Advil, Aleve, Ibuprofen, Motrin, Naproxen, Naprosyn and Aspirin based products such as Excedrin, Goodys Powder, BC Powder.You may however, continue to take Tylenol if needed for pain up until the day of surgery.  Stop ANY OVER THE COUNTER supplements/vitamins NOW (07-27-21) until after surgery  No Alcohol for 24 hours before or after surgery.  No Smoking including e-cigarettes for 24 hours prior to surgery.  No chewable tobacco products for at least 6 hours prior to surgery.  No nicotine patches on the day of surgery.  Do not use any "recreational" drugs for at least a week prior to your surgery.  Please be advised that the combination of cocaine and anesthesia may have negative outcomes, up to and including death. If you test positive for cocaine,  your surgery will be cancelled.  On the morning of surgery brush your teeth with toothpaste and water, you may rinse your mouth with mouthwash if you wish. Do not swallow any toothpaste or mouthwash.  Do not wear jewelry, make-up, hairpins, clips or nail polish.  Do not wear lotions, powders, or perfumes.   Do not shave body from the neck down 48 hours prior to surgery just in case you cut yourself which could leave a site for infection.  Also, freshly shaved skin may become irritated if using the CHG soap.  Contact lenses, hearing aids and dentures may not be worn into surgery.  Do not bring valuables to the hospital. [facility] is not responsible for any missing/lost belongings or valuables.   Notify your doctor if there is any change in your medical condition (cold, fever, infection).  Wear comfortable clothing (specific to your surgery type) to the hospital.  After surgery, you can help prevent lung complications by doing breathing exercises.  Take deep breaths and cough every 1-2 hours. Your doctor may order a device called an Incentive Spirometer to help you take deep breaths. When coughing or sneezing, hold a pillow firmly against your incision with both hands. This is called splinting. Doing this helps protect your incision. It also decreases belly discomfort.  If you are being admitted to the hospital overnight, leave your suitcase in the car. After surgery it may be brought to your room.  If you are being discharged the day of surgery, you will not be allowed to drive  home. You will need a responsible adult (18 years or older) to drive you home and stay with you that night.   If you are taking public transportation, you will need to have a responsible adult (18 years or older) with you. Please confirm with your physician that it is acceptable to use public transportation.   Please call the Pre-admissions Testing Dept. at [phone] if you have any questions about  these instructions.  Surgery Visitation Policy:  Patients undergoing a surgery or procedure may have one family member or support person with them as long as that person is not COVID-19 positive or experiencing its symptoms.  That person may remain in the waiting area during the procedure and may rotate out with other people.  Inpatient Visitation:    Visiting hours are 7 a.m. to 8 p.m. Up to two visitors ages 16+ are allowed at one time in a patient room. The visitors may rotate out with other people during the day. Visitors must check out when they leave, or other visitors will not be allowed. One designated support person may remain overnight. The visitor must pass COVID-19 screenings, use hand sanitizer when entering and exiting the patients room and wear a mask at all times, including in the patients room. Patients must also wear a mask when staff or their visitor are in the room. Masking is required regardless of vaccination status.

## 2021-07-28 ENCOUNTER — Other Ambulatory Visit: Payer: Self-pay

## 2021-07-28 ENCOUNTER — Emergency Department
Admission: EM | Admit: 2021-07-28 | Discharge: 2021-07-29 | Disposition: A | Discharge status: Home / Self Care | Payer: Medicaid Other | Attending: Emergency Medicine | Admitting: Emergency Medicine

## 2021-07-28 ENCOUNTER — Emergency Department: Payer: Medicaid Other

## 2021-07-28 DIAGNOSIS — R202 Paresthesia of skin: Secondary | ICD-10-CM | POA: Insufficient documentation

## 2021-07-28 DIAGNOSIS — R5383 Other fatigue: Secondary | ICD-10-CM | POA: Diagnosis not present

## 2021-07-28 DIAGNOSIS — R0602 Shortness of breath: Secondary | ICD-10-CM | POA: Insufficient documentation

## 2021-07-28 DIAGNOSIS — L929 Granulomatous disorder of the skin and subcutaneous tissue, unspecified: Secondary | ICD-10-CM | POA: Diagnosis not present

## 2021-07-28 DIAGNOSIS — R634 Abnormal weight loss: Secondary | ICD-10-CM | POA: Diagnosis not present

## 2021-07-28 DIAGNOSIS — R0789 Other chest pain: Secondary | ICD-10-CM | POA: Insufficient documentation

## 2021-07-28 DIAGNOSIS — R079 Chest pain, unspecified: Secondary | ICD-10-CM

## 2021-07-28 DIAGNOSIS — R918 Other nonspecific abnormal finding of lung field: Secondary | ICD-10-CM | POA: Diagnosis not present

## 2021-07-28 LAB — CBC
HCT: 33.3 % — ABNORMAL LOW (ref 36.0–46.0)
Hemoglobin: 10.8 g/dL — ABNORMAL LOW (ref 12.0–15.0)
MCH: 30.7 pg (ref 26.0–34.0)
MCHC: 32.4 g/dL (ref 30.0–36.0)
MCV: 94.6 fL (ref 80.0–100.0)
Platelets: 200 10*3/uL (ref 150–400)
RBC: 3.52 MIL/uL — ABNORMAL LOW (ref 3.87–5.11)
RDW: 12.6 % (ref 11.5–15.5)
WBC: 6 10*3/uL (ref 4.0–10.5)
nRBC: 0 % (ref 0.0–0.2)

## 2021-07-28 LAB — COMPREHENSIVE METABOLIC PANEL
ALT: 9 U/L (ref 0–44)
AST: 13 U/L — ABNORMAL LOW (ref 15–41)
Albumin: 3.9 g/dL (ref 3.5–5.0)
Alkaline Phosphatase: 55 U/L (ref 38–126)
Anion gap: 6 (ref 5–15)
BUN: 14 mg/dL (ref 6–20)
CO2: 24 mmol/L (ref 22–32)
Calcium: 9.1 mg/dL (ref 8.9–10.3)
Chloride: 106 mmol/L (ref 98–111)
Creatinine, Ser: 0.79 mg/dL (ref 0.44–1.00)
GFR, Estimated: >60 mL/min (ref >60)
Glucose, Bld: 100 mg/dL — ABNORMAL HIGH (ref 70–99)
Potassium: 3.9 mmol/L (ref 3.5–5.1)
Sodium: 136 mmol/L (ref 135–145)
Total Bilirubin: 0.4 mg/dL (ref 0.3–1.2)
Total Protein: 6.7 g/dL (ref 6.5–8.1)

## 2021-07-28 LAB — TROPONIN I (HIGH SENSITIVITY)
Troponin I (High Sensitivity): <2 ng/L (ref <18)
Troponin I (High Sensitivity): <2 ng/L (ref <18)

## 2021-07-28 LAB — CBG MONITORING, ED: Glucose-Capillary: 110 mg/dL — ABNORMAL HIGH (ref 70–99)

## 2021-07-28 MED ORDER — IOHEXOL 350 MG/ML SOLN
60.0000 mL | Freq: Once | INTRAVENOUS | Status: AC | PRN
  Administered 2021-07-28: 60 mL via INTRAVENOUS

## 2021-07-28 MED ORDER — SODIUM CHLORIDE 0.9 % IV BOLUS: 1000.0000 mL | Freq: Once | INTRAVENOUS | Status: DC

## 2021-07-28 NOTE — ED Provider Notes (Signed)
[facility] Provider Note    Event Date/Time   First MD Initiated Contact with Patient 07/28/21 2157     (approximate)   History   Chest pain   HPI  Megan Bentley is a 36 y.o. female with a history of anxiety, hysterectomy in January  Patient she was at home watching her daughter play when suddenly she felt a sudden sense of heaviness in her chest and chest pain.  She felt short of breath.  She felt like several times as though she was going to pass out she reports she was breathing very quickly felt like she could not catch her breath at all.  She reports it almost felt like a "panic attack" but she has never had anything this severe.  Nothing that would have precipitated a panic attack.  She was caring for her child when this occurred  She does take gabapentin but denies taking any other medications or substances  I reviewed patient's notes including operative note from January of this year where the patient underwent hysterectomy   At the present time she feels well, her symptoms have improved and she feels comfortable without concern.  She also relates that when the episode happened it lasted about an hour and she felt weak confused, could not describe the symptoms too well and her family brought her to the ER for evaluation.  She now reports that she feels much better, she has no headache but did experience a heavy chest pressure and shortness of breath during the episode but is now since gone away.  Denies history of blood clots does not take any estrogens.  Did have surgery in January, denies any vaginal bleeding or symptoms associated.  She does report difficulty with initiating urination, however this has been ongoing since the time of her hysterectomy and reports she is following with GYN who are continuing evaluation for this ongoing issue.  Physical Exam   Triage Vital Signs: ED Triage Vitals  Enc Vitals Group     BP 07/28/21 1918 (!) 118/100      Pulse Rate 07/28/21 1918 (!) 113     Resp 07/28/21 1918 19     Temp 07/28/21 1918 99 F (37.2 C)     Temp Source 07/28/21 1918 Oral     SpO2 07/28/21 1918 97 %     Weight 07/28/21 1920 89 lb (40.4 kg)     Height 07/28/21 1920 5\' 4"  (1.626 m)     Head Circumference --      Peak Flow --      Pain Score 07/28/21 1920 0     Pain Loc --      Pain Edu? --      Excl. in [place]? --     Most recent vital signs: Vitals:   07/28/21 1918 07/28/21 2151  BP: (!) 118/100 115/72  Pulse: (!) 113 92  Resp: 19 18  Temp: 99 F (37.2 C)   SpO2: 97% 100%     General: Awake, no distress.  She is now fully alert oriented to month date and time as well as location.  She reports she felt confused during the episode but that has resolved CV:  Good peripheral perfusion.  Normal heart tones.  Normal rate and rhythm. Resp:  Normal effort.  Clear lung sounds bilaterally.  No distress.  Speaks in full clear sentences Abd:  No distention.  No abdominal pain to palpation all quadrants. Other:  No lower extremity or  calf edema or unilateral swelling.  At the present time the patient is alert well oriented without distress.  Reports her symptoms have improved.  Appears in well-appearing nontoxic  Moves all extremities 5-5 strength.  Normal facial expressions and cranial nerve exam.  Clear speech.  Denies headache.  Limitedly reported head neck pain as well during triage, patient does not note neck pain at this time  ED Results / Procedures / Treatments   Labs (all labs ordered are listed, but only abnormal results are displayed) Labs Reviewed  CBC - Abnormal; Notable for the following components:      Result Value   RBC 3.52 (*)    Hemoglobin 10.8 (*)    HCT 33.3 (*)    All other components within normal limits  COMPREHENSIVE METABOLIC PANEL - Abnormal; Notable for the following components:   Glucose, Bld 100 (*)    AST 13 (*)    All other components within normal limits  CBG MONITORING, ED - Abnormal;  Notable for the following components:   Glucose-Capillary 110 (*)    All other components within normal limits  TROPONIN I (HIGH SENSITIVITY)  TROPONIN I (HIGH SENSITIVITY)   Labs reviewed notable for mild anemia that is chronic in nature, first cardiac troponin is normal.  Second troponin is also normal (less than 2)  Metabolic panel is normal  EKG  Reviewed interpreted me at 1925 Heart rate 105 QRS 70 QTc 470 Sinus tachycardia without evidence of ischemia   RADIOLOGY  I personally viewed the patient's CT angiography of the chest negative for large pulmonary embolism or obvious major acute pulmonary etiology.  Defer to radiologist for detail.  CT Angio Chest PE W and/or Wo Contrast   Radiology - IMPRESSION: Negative for acute pulmonary embolus.  Clear lung fields.      PROCEDURES:  Critical Care performed: No  Procedures   MEDICATIONS ORDERED IN ED: Medications  iohexol (OMNIPAQUE) 350 MG/ML injection 60 mL (60 mLs Intravenous Contrast Given 07/28/21 2240)     IMPRESSION / MDM / ASSESSMENT AND PLAN / ED COURSE  I reviewed the triage vital signs and the nursing notes.                              Differential diagnosis includes, but is not limited to, pulmonary embolism, acute cardiopulmonary process.  EKG and first troponin very reassuring without evidence of ACS.  She reports a sudden onset of chest discomfort shortness of breath and feeling as though she was going to pass out that last about an hour, she also reports she felt very confused and "out of it" during that episode.  She relates all symptoms started with an episode of sudden onset chest pain.  Given her recent surgery I think it is prudent to rule out acute etiology such as pulmonary embolism, pneumonia, dissection, or other acute pulmonary process.  She has a very reassuring abdominal exam normal hemoglobin to her baseline, no evidence of acute infection afebrile status.  Neurologic exam is very normal  moves all extremities well fully alert and nothing that would indicate an acute central neurologic process.  She does report that she feels a sort of tight crampy feeling in the hands and the tingly feeling in her fingertips when symptoms occur  Patient is maintained on cardiac monitor.  No evidence of arrhythmias noted.    Clinical Course as of 07/28/21 [place] Jul 28, 2021  2247  Personally viewed and interpreted the patient's CT angiography, I do not see evidence of gross pulmonary embolism or saddle embolism.  Await final report from radiologist [MQ]    Clinical Course User Index [MQ] Delman Kitten, MD   ----------------------------------------- 11:38 PM on 07/28/2021 ----------------------------------------- Patient work-up very reassuring to this point.  She is fully awake alert well-oriented.  She does not appear in any distress and has normal hemodynamics.  She advised at this point she does still have a feeling of tingliness in her fingertips, but otherwise appears well.  She reports that she is slightly anxious now because of the symptoms that she had earlier, but is agreeable with plan for discharge with careful return precautions.  She is following with her OB/GYN as well, saw them today in the clinic she reports  To this point, she appears hemodynamically stable and I think very much appropriate for further outpatient follow-up.  I suspect this may now at this point be related to underlying bone of anxiety which she does carry, as her work-up here is very reassuring.  We will discharge patient to home, she is comfortable agreeable with this plan will follow-up with her outpatient providers.  Return precautions and treatment recommendations and follow-up discussed with the patient who is agreeable with the plan.   FINAL CLINICAL IMPRESSION(S) / ED DIAGNOSES   Final diagnoses:  Chest pain with low risk for cardiac etiology  Paresthesia of hand, bilateral     Rx / DC Orders    ED Discharge Orders     None        Note:  This document was prepared using Dragon voice recognition software and may include unintentional dictation errors.   Delman Kitten, MD 07/28/21 [phone]

## 2021-07-28 NOTE — Discharge Instructions (Addendum)
You have been seen in the Emergency Department (ED) today for chest pain and shortness of breath that have now improved.  As we have discussed todays test results are normal, but you may require further testing.  Please follow up with the recommended doctor as instructed above in these documents regarding todays emergent visit and your recent symptoms to discuss further management.    Return to the Emergency Department (ED) if you experience any worsening chest pain/pressure/tightness, confusion, headache, weakness, difficulty breathing, or sudden sweating, or other symptoms that concern you.

## 2021-07-28 NOTE — ED Notes (Signed)
This RN went to start fluid bolus and pt voiced concern about it making her need to pee. Reported she has had trouble emptying her bladder lately and that it is painful. Bladder scan obtained and > 200 mL post void residual noted. Provider aware.

## 2021-07-28 NOTE — ED Triage Notes (Signed)
Pt in from home due to chest pain and heaviness; reports "passed out" several times; is lethargic currently; reports neck pain. Denies taking any substances; reports hysterectomy 3 weeks ago; denies major blood loss. A&Ox3 currently; pt unsure of year and month.

## 2021-07-29 ENCOUNTER — Encounter: Payer: Self-pay | Admitting: Nurse Practitioner

## 2021-07-29 DIAGNOSIS — R200 Anesthesia of skin: Secondary | ICD-10-CM | POA: Diagnosis not present

## 2021-07-29 DIAGNOSIS — F418 Other specified anxiety disorders: Secondary | ICD-10-CM | POA: Diagnosis not present

## 2021-07-29 DIAGNOSIS — F411 Generalized anxiety disorder: Secondary | ICD-10-CM | POA: Diagnosis not present

## 2021-07-29 DIAGNOSIS — G4489 Other headache syndrome: Secondary | ICD-10-CM | POA: Diagnosis not present

## 2021-07-30 ENCOUNTER — Ambulatory Visit: Payer: Self-pay

## 2021-07-30 ENCOUNTER — Ambulatory Visit: Admission: RE | Admit: 2021-07-30 | Payer: Medicaid Other | Source: Home / Self Care | Admitting: Surgery

## 2021-07-30 SURGERY — EXAM UNDER ANESTHESIA WITH HEMORRHOIDECTOMY
Anesthesia: General | Site: Anus

## 2021-07-30 NOTE — Telephone Encounter (Signed)
Summary: Patient requesting D Dimer blood work   Patient called in to inform Marnee Guarneri that she was seen in the ER a few days ago where she is still having memory issues. Patient is asking if [name] can order a D Dimer blood test to see if she have a blood clot anywhere in her brain. She states that she is not sure if she had a stroke because she still cant remember anything from the ER visit and having other memory issues also. Please advise and call patient at  Ph# [phone]        Pt.'s voice mailbox is full, unable to leave message.

## 2021-07-30 NOTE — Telephone Encounter (Signed)
Called and spoke to the patient. She states that she did a virtual visit with Neurology today. States she was prescribed an anti-seizure medication because neurology told her that she either had a stroke or a blood clot. States they told her to follow up in 6 weeks. Patient states that she is not going to the ER for a D-dimer blood test. Patient is requesting to have the blood test here, states that is all she wants to have done.

## 2021-07-30 NOTE — Telephone Encounter (Signed)
Pt. Is calling back. Wants to be sure she gets a response today. Tearful, "I'm very concerned and would like to hear something today." Please advise pt.

## 2021-07-30 NOTE — Telephone Encounter (Signed)
°  Pt. states symptoms have improved but requesting D Dimer to rule out clot "In my brain." States neurologist prescribed "Anti-seizure med" but would like lab work before she takes the med. Please advise. [phone]    Reason for Disposition  [1] Caller requesting NON-URGENT health information AND [2] PCP's office is the best resource  Answer Assessment - Initial Assessment Questions 1. REASON FOR CALL or QUESTION: "What is your reason for calling today?" or "How can I best help you?" or "What question do you have that I can help answer?"     Requesting D Dimer "To make sure I don't have a blood clot in my brain."  Protocols used: Information Only Call - No Triage-A-AH

## 2021-07-31 NOTE — Telephone Encounter (Signed)
Called and notified patient of Megan Bentley's message. Patient verbalized understanding.  

## 2021-08-01 ENCOUNTER — Emergency Department
Admission: EM | Admit: 2021-08-01 | Discharge: 2021-08-01 | Disposition: A | Discharge status: Home / Self Care | Payer: Medicaid Other | Attending: Emergency Medicine | Admitting: Emergency Medicine

## 2021-08-01 ENCOUNTER — Encounter: Payer: Self-pay | Admitting: Emergency Medicine

## 2021-08-01 ENCOUNTER — Emergency Department: Payer: Medicaid Other

## 2021-08-01 ENCOUNTER — Other Ambulatory Visit: Payer: Self-pay

## 2021-08-01 DIAGNOSIS — R4182 Altered mental status, unspecified: Secondary | ICD-10-CM | POA: Insufficient documentation

## 2021-08-01 DIAGNOSIS — R000 Tachycardia, unspecified: Secondary | ICD-10-CM | POA: Diagnosis not present

## 2021-08-01 DIAGNOSIS — R5383 Other fatigue: Secondary | ICD-10-CM | POA: Insufficient documentation

## 2021-08-01 DIAGNOSIS — R413 Other amnesia: Secondary | ICD-10-CM | POA: Diagnosis not present

## 2021-08-01 DIAGNOSIS — I629 Nontraumatic intracranial hemorrhage, unspecified: Secondary | ICD-10-CM | POA: Diagnosis not present

## 2021-08-01 DIAGNOSIS — R29818 Other symptoms and signs involving the nervous system: Secondary | ICD-10-CM | POA: Diagnosis not present

## 2021-08-01 LAB — CBC
HCT: 40.4 % (ref 36.0–46.0)
Hemoglobin: 12.9 g/dL (ref 12.0–15.0)
MCH: 30.5 pg (ref 26.0–34.0)
MCHC: 31.9 g/dL (ref 30.0–36.0)
MCV: 95.5 fL (ref 80.0–100.0)
Platelets: 201 10*3/uL (ref 150–400)
RBC: 4.23 MIL/uL (ref 3.87–5.11)
RDW: 12.5 % (ref 11.5–15.5)
WBC: 8.3 10*3/uL (ref 4.0–10.5)
nRBC: 0 % (ref 0.0–0.2)

## 2021-08-01 LAB — COMPREHENSIVE METABOLIC PANEL
ALT: 10 U/L (ref 0–44)
AST: 17 U/L (ref 15–41)
Albumin: 4.5 g/dL (ref 3.5–5.0)
Alkaline Phosphatase: 66 U/L (ref 38–126)
Anion gap: 5 (ref 5–15)
BUN: 17 mg/dL (ref 6–20)
CO2: 26 mmol/L (ref 22–32)
Calcium: 8.9 mg/dL (ref 8.9–10.3)
Chloride: 101 mmol/L (ref 98–111)
Creatinine, Ser: 0.68 mg/dL (ref 0.44–1.00)
GFR, Estimated: >60 mL/min (ref >60)
Glucose, Bld: 85 mg/dL (ref 70–99)
Potassium: 4.7 mmol/L (ref 3.5–5.1)
Sodium: 132 mmol/L — ABNORMAL LOW (ref 135–145)
Total Bilirubin: 0.5 mg/dL (ref 0.3–1.2)
Total Protein: 8 g/dL (ref 6.5–8.1)

## 2021-08-01 LAB — URINE DRUG SCREEN, QUALITATIVE (ARMC ONLY)
Amphetamines, Ur Screen: NOT DETECTED
Barbiturates, Ur Screen: NOT DETECTED
Benzodiazepine, Ur Scrn: POSITIVE — AB
Cannabinoid 50 Ng, Ur ~~LOC~~: POSITIVE — AB
Cocaine Metabolite,Ur ~~LOC~~: NOT DETECTED
MDMA (Ecstasy)Ur Screen: NOT DETECTED
Methadone Scn, Ur: NOT DETECTED
Opiate, Ur Screen: NOT DETECTED
Phencyclidine (PCP) Ur S: NOT DETECTED
Tricyclic, Ur Screen: POSITIVE — AB

## 2021-08-01 MED ORDER — LORAZEPAM 1 MG PO TABS
1.0000 mg | ORAL_TABLET | Freq: Once | ORAL | Status: AC
  Administered 2021-08-01: 1 mg via ORAL
  Filled 2021-08-01: qty 1

## 2021-08-01 MED ORDER — IBUPROFEN 400 MG PO TABS
400.0000 mg | ORAL_TABLET | Freq: Once | ORAL | Status: DC
  Filled 2021-08-01: qty 1

## 2021-08-01 MED ORDER — METOCLOPRAMIDE HCL 10 MG PO TABS
10.0000 mg | ORAL_TABLET | Freq: Once | ORAL | Status: DC
Start: 2021-08-01 — End: 2021-08-02
  Filled 2021-08-01: qty 1

## 2021-08-01 NOTE — ED Triage Notes (Signed)
Pt via POV from home. Per family, on 2/14 pt had an episode where pt was disoriented and did not know what was going on and vision changes. Pt states that she does not remember coming  to the ER that day. Pt states that they did not take her serious  when she came. Pt has been in bed since then. Pt was prescribed Lamictal yesterday. Pt states today she had a loss of balance and unable to concentrate and now having a headache. Pt seen by a Neurologist for headaches and "seizure like activity". Pt is A&Ox4 and NAD.

## 2021-08-01 NOTE — ED Notes (Addendum)
Pt advised to RN that she was seen on 07/28/2021 due to her having a " blackout" where she does not remember anything about the events leading up to the ER visit and the ED visit. Pt described to RN the events of the ED visit and that she followed up with Dr. Malvin Johns virtually and that she was put on seizure meds. Pt also sts that she has been seen for previous syncope previously with Dr. Malvin Johns and they are unsure as to what is the cause. Pt sts " people are not taking me seriously" Pt is currently A/Ox4, ambulatory with a steady gait and walked back to ED room with husband at her side.

## 2021-08-01 NOTE — ED Notes (Signed)
Pt ambulating in hall without assistance. Gait steady at this time.

## 2021-08-01 NOTE — ED Notes (Signed)
Patient transported to MRI 

## 2021-08-01 NOTE — ED Notes (Signed)
Pt ambulatory to restroom with steady and even gait to provide urine sample.

## 2021-08-01 NOTE — ED Notes (Signed)
ED POC urine preg test Negative.

## 2021-08-01 NOTE — ED Notes (Signed)
RN unable to provide urine sample at this time. RN notified provider.
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
## 2021-08-01 NOTE — ED Notes (Signed)
RN attempted to give pt meds as directed by MD order for her head pain. Pt sts " Advil should not of been even invented. It does not even help me, so you can put that back. I am sure my sister had a reaction to Reglan and cannot take it so I dont want it cause I dont want to have a reaction."

## 2021-08-01 NOTE — ED Notes (Signed)
RN admin ativan 1mg  po as directed by verbal order from provider. RN explained to pt that ativan has been ordered for her claustrophobia during the MRI. Pt takes med, than sts " I dont even know why you gave that to me when it wont do anything anyway. I take three clonopin every day."

## 2021-08-02 ENCOUNTER — Encounter: Payer: Self-pay | Admitting: Nurse Practitioner

## 2021-08-03 ENCOUNTER — Telehealth: Payer: Self-pay | Admitting: *Deleted

## 2021-08-03 NOTE — Telephone Encounter (Signed)
Transition Care Management Follow-up Telephone Call Date of discharge and from where: 08/01/2021 [facility] ED How have you been since you were released from the hospital? "Fine, I guess. Any questions or concerns? No  Items Reviewed: Did the pt receive and understand the discharge instructions provided? Yes  Medications obtained and verified?  N/A Other? No  Any new allergies since your discharge? No  Dietary orders reviewed? No Do you have support at home? Yes    Functional Questionnaire: (I = Independent and D = Dependent) ADLs: I  Bathing/Dressing- I  Meal Prep- I  Eating- I  Maintaining continence- I  Transferring/Ambulation- I  Managing Meds- I  Follow up appointments reviewed:  PCP Hospital f/u appt confirmed? Yes  Scheduled to see Megan Dials, NP on 08/04/2021 @ 1040. Specialist Hospital f/u appt confirmed? No  Are transportation arrangements needed? No  If their condition worsens, is the pt aware to call PCP or go to the Emergency Dept.? Yes Was the patient provided with contact information for the PCP's office or ED? Yes Was to pt encouraged to call back with questions or concerns? Yes

## 2021-08-04 ENCOUNTER — Encounter: Payer: Self-pay | Admitting: Nurse Practitioner

## 2021-08-04 ENCOUNTER — Ambulatory Visit (INDEPENDENT_AMBULATORY_CARE_PROVIDER_SITE_OTHER): Payer: Medicaid Other | Admitting: Nurse Practitioner

## 2021-08-04 ENCOUNTER — Other Ambulatory Visit: Payer: Self-pay

## 2021-08-04 VITALS — BP 90/59 | HR 91 | Temp 98.9°F | Ht 64.0 in | Wt 92.4 lb

## 2021-08-04 DIAGNOSIS — E440 Moderate protein-calorie malnutrition: Secondary | ICD-10-CM | POA: Diagnosis not present

## 2021-08-04 DIAGNOSIS — D649 Anemia, unspecified: Secondary | ICD-10-CM

## 2021-08-04 DIAGNOSIS — F32A Depression, unspecified: Secondary | ICD-10-CM

## 2021-08-04 DIAGNOSIS — G40909 Epilepsy, unspecified, not intractable, without status epilepticus: Secondary | ICD-10-CM | POA: Diagnosis not present

## 2021-08-04 DIAGNOSIS — G8929 Other chronic pain: Secondary | ICD-10-CM

## 2021-08-04 DIAGNOSIS — K641 Second degree hemorrhoids: Secondary | ICD-10-CM | POA: Diagnosis not present

## 2021-08-04 DIAGNOSIS — F902 Attention-deficit hyperactivity disorder, combined type: Secondary | ICD-10-CM | POA: Diagnosis not present

## 2021-08-04 DIAGNOSIS — Z8489 Family history of other specified conditions: Secondary | ICD-10-CM | POA: Diagnosis not present

## 2021-08-04 DIAGNOSIS — F419 Anxiety disorder, unspecified: Secondary | ICD-10-CM | POA: Diagnosis not present

## 2021-08-04 DIAGNOSIS — M545 Low back pain, unspecified: Secondary | ICD-10-CM | POA: Diagnosis not present

## 2021-08-04 HISTORY — DX: Family history of other specified conditions: Z84.89

## 2021-08-04 NOTE — Assessment & Plan Note (Signed)
Noted on labs post-op, recent ER labs showed some improvement in H/H.  She is not taking iron supplement daily, recommend she continue supplement daily due to levels improving.

## 2021-08-04 NOTE — Assessment & Plan Note (Signed)
Chronic, with current acute pain reported after falls with possible seizure activity.  At this time discussed with patient to utilize lidocaine patches over the counter + creams & Tylenol as needed + heat pad.  Will not fill any opioids at this time, which patient appear agitated about, explained to her concern for lowering seizure threshold with some narcotics, such as Tramadol, plus concern with her memory changes recently of affecting this more and increasing fatigue putting at risk for increased falls.  At this time will order imaging lower back and further determine plan after these return.

## 2021-08-04 NOTE — Assessment & Plan Note (Signed)
Chronic, ongoing, followed by neurology with Klonopin ordered.  She is aware of long term risks of chronic benzo use and aware this script needs to come from neurology, as they initiated this treatment.  At this time discussed with her need to reduce to discontinue this script as she wishes to continue on medication for her ADHD.  A verbal agreement was made to work on this reduction to discontinuation while initiating Vyvanse in future.  Controlled substance contract and UDS up to date -- had at length discussion with her about no MJ use, this was noted again on recent drug screening.  Would benefit psychiatry referral in future.

## 2021-08-04 NOTE — Assessment & Plan Note (Signed)
At this time is holding off on her medication and not taking she reports.  Have discussed at length with her risks with use of Klonopin and Vyvanse -- at this time discussed with her need to reduce to discontinue this script (Klonopin) slow reduction and maintain Vyvanse if she decides in future to use.  A verbal agreement was made to work on this reduction to discontinuation while initiating Vyvanse.  Controlled substance contract and UDS up to date.  Would benefit from psychiatry referral in future.

## 2021-08-04 NOTE — Assessment & Plan Note (Signed)
Ongoing, underweight, but has gained 3 pounds.  Continue supplements at home and monitor weight closely.  Denies any purging/binging or restrictions.

## 2021-08-04 NOTE — Assessment & Plan Note (Addendum)
New diagnosis reported, followed by neurology -- have discussed at length with patient concern related to current discontinuation of medications, but she reports she tapered off of these and did not cold [place].  UDS in hospital + benzo and MJ.  At this time continue collaboration with neurology, would benefit from EEG.  MRI was reassuring recently.  She is very agitated at this time, discussed at length plan with her and goal to avoid medications that could further decrease seizure threshold.  Labs today, including hormone labs per her request.

## 2021-08-04 NOTE — Assessment & Plan Note (Signed)
Noted on exam -- was scheduled for surgery but this is on hold at this time.  Monitor closely.  Lab check today.

## 2021-08-04 NOTE — Progress Notes (Signed)
BP (!) 90/59    Pulse 91    Temp 98.9 F (37.2 C) (Oral)    Ht 5\' 4"  (1.626 m)    Wt 92 lb 6.4 oz (41.9 kg)    LMP 06/11/2021 (Approximate)    SpO2 96%    BMI 15.86 kg/m    Subjective:    Patient ID: Megan Bentley, female    DOB: Aug 13, 1985, 36 y.o.   MRN: [id number]  HPI: Megan Bentley is a 36 y.o. female  Chief Complaint  Patient presents with   Anemia   Mood   Hemorrhoids   Memory Loss    Patient states she since her "seizure" she is noticing some memory change. Patient states she is scheduled to follow up with her Neurologist next week.    Fall    Patient states she recently fell in her bathroom and hit her back pretty hard and has been experiencing pain.    Headache   HEMORRHOIDS Hemorrhoids were thrombosed, is to have surgery but this is on hold.  Had some anemia with this, but improved recent labs in ER -- has iron at home, but taking minimally. Duration: chronic Anal fullness: yes Perianal itching/irritation: none Perianal pain: no Bright red rectal bleeding: no Amount of blood: none Frequency: intermittent Constipation: no Hard stools: no Chronic straining/valsava: no Alleviating factors: stool softeners Aggravating factors: none Status: stable Treatments attempted: nothing  Previous hemorrhoids: yes  Colonoscopy: no   BACK PAIN (FALL) Has had multiple falls, does not recall how many due to memory loss.  Has underlying chronic pain to back.  Had at length discussion with patient about risk of taking opioids at this time, especially Tramadol which has offered benefit in past for short periods due to risk for lowering seizure threshold and increasing fatigue. Duration: days Mechanism of injury:  falls Location: bilateral and low back Onset: gradual Severity: 10/10 Quality: dull and aching Frequency: constant Radiation: none Aggravating factors: movement and prolonged sitting Alleviating factors: heat and APAP Status: fluctuating Treatments attempted:  heat and APAP Relief with NSAIDs?: No NSAIDs Taken Nighttime pain:  no Paresthesias / decreased sensation:  no Bowel / bladder incontinence:  no Fevers:  no Dysuria / urinary frequency:  no   DEPRESSION/ADHD/ANXIETY Recently had episode of memory loss and was in ER -- this is after she came off all her medications (including Vyvanse and Klonopin).  She is followed by neurology.  MRI done in 08/01/21 and was normal.  Passed out in ER and at home, fallen twice at home and having memory changes -- suspected to have seizures and neurology has placed her on Lamictal and back on Gabapentin.  UDS in hospital continued to show positive for benzo, she reports she has not taken in one month.    [place] stating she can only have two providers prescribing controlled substances -- PCP and neurology.  Lock in for two years she reports.   Has Vyvanse for ADHD, as she is starting back to school soon -- reports she has not taken this.  Continues on Klonopin (last fill 07/29/21) as ordered by neurology, however we discussed at length last visit goal to work on discontinuing this over next few months due to having Vyvanse on board (last fill 05/13/22).   She is also followed by neurology for multiple concerns noted above. Her father has hereditary spastic paraplegia = has referral to same provider at [facility] her father  sees to assess this. Mood status: controlled Satisfied with current treatment?: yes Symptom severity: mild  Duration of current treatment : chronic Side effects: no Medication compliance: good compliance Psychotherapy/counseling: none Previous psychiatric medications: multiple medications Depressed mood: no Anxious mood: no Anhedonia: no Significant weight loss or gain: no Insomnia: none Fatigue: no Feelings of worthlessness or guilt: no Impaired concentration/indecisiveness: no Suicidal ideations: no Hopelessness: no Crying  spells: no Depression screen [facility] 2/9 08/04/2021 07/06/2021 05/11/2021 06/26/2020 05/13/2020  Decreased Interest 1 0 1 1 1   Down, Depressed, Hopeless 0 0 1 1 1   PHQ - 2 Score 1 0 2 2 2   Altered sleeping 3 0 0 1 1  Tired, decreased energy 3 3 1 1 1   Change in appetite 3 1 1 1  0  Feeling bad or failure about yourself  2 0 0 1 1  Trouble concentrating 3 0 0 0 3  Moving slowly or fidgety/restless 0 0 0 0 1  Suicidal thoughts 0 0 0 0 0  PHQ-9 Score 15 4 4 6 9   Difficult doing work/chores - Not difficult at all Somewhat difficult - Somewhat difficult    GAD 7 : Generalized Anxiety Score 08/04/2021 07/06/2021 05/11/2021 05/13/2020  Nervous, Anxious, on Edge 2 2 3 3   Control/stop worrying 3 3 3 3   Worry too much - different things 3 3 3 3   Trouble relaxing 3 2 3 3   Restless 1 3 2 1   Easily annoyed or irritable 2 1 3 1   Afraid - awful might happen 3 3 3 1   Total GAD 7 Score 17 17 20 15   Anxiety Difficulty Extremely difficult Somewhat difficult Somewhat difficult Somewhat difficult   Relevant past medical, surgical, family and social history reviewed and updated as indicated. Interim medical history since our last visit reviewed. Allergies and medications reviewed and updated.  Review of Systems  Constitutional:  Negative for activity change, appetite change, diaphoresis, fatigue and fever.  Respiratory:  Negative for cough, chest tightness and shortness of breath.   Cardiovascular:  Negative for chest pain, palpitations and leg swelling.  Gastrointestinal: Negative.   Musculoskeletal:  Positive for back pain.  Neurological: Negative.   Psychiatric/Behavioral:  Positive for decreased concentration. Negative for self-injury, sleep disturbance and suicidal ideas. The patient is nervous/anxious.    Per HPI unless specifically indicated above     Objective:    BP (!) 90/59    Pulse 91    Temp 98.9 F (37.2 C) (Oral)    Ht 5\' 4"  (1.626 m)    Wt 92 lb 6.4 oz (41.9 kg)    LMP 06/11/2021  (Approximate)    SpO2 96%    BMI 15.86 kg/m   Wt Readings from Last 3 Encounters:  08/04/21 92 lb 6.4 oz (41.9 kg)  08/01/21 89 lb (40.4 kg)  07/28/21 89 lb (40.4 kg)    Physical Exam Vitals and nursing note reviewed.  Constitutional:      General: She is awake. She is not in acute distress.    Appearance: She is well-developed and well-groomed. She is not ill-appearing or toxic-appearing.  HENT:     Head: Normocephalic.     Right Ear: Hearing, tympanic membrane, ear canal and external ear normal.     Left Ear: Hearing, tympanic membrane, ear canal and external ear normal.  Eyes:     General: Lids are normal.        Right eye: No discharge.        Left eye: No  discharge.     Extraocular Movements: Extraocular movements intact.     Conjunctiva/sclera: Conjunctivae normal.     Pupils: Pupils are equal, round, and reactive to light.     Visual Fields: Right eye visual fields normal and left eye visual fields normal.  Neck:     Thyroid: No thyromegaly.     Vascular: No carotid bruit or JVD.  Cardiovascular:     Rate and Rhythm: Normal rate and regular rhythm.     Heart sounds: Normal heart sounds. No murmur heard.   No gallop.  Pulmonary:     Effort: Pulmonary effort is normal.     Breath sounds: Normal breath sounds.  Abdominal:     General: Bowel sounds are normal.     Palpations: Abdomen is soft.  Musculoskeletal:     Cervical back: Normal range of motion and neck supple.     Right lower leg: No edema.     Left lower leg: No edema.  Lymphadenopathy:     Cervical: No cervical adenopathy.  Skin:    General: Skin is warm and dry.  Neurological:     Mental Status: She is alert and oriented to person, place, and time.     Sensory: Sensation is intact.     Motor: Motor function is intact.     Coordination: Coordination is intact.     Gait: Gait is intact.     Deep Tendon Reflexes: Reflexes are normal and symmetric.     Reflex Scores:      Brachioradialis reflexes are 2+  on the right side and 2+ on the left side.      Patellar reflexes are 2+ on the right side and 2+ on the left side. Psychiatric:        Attention and Perception: Attention normal.        Mood and Affect: Affect is angry and tearful.        Speech: Speech normal.        Behavior: Behavior is agitated. Behavior is cooperative.        Thought Content: Thought content normal.    Results for orders placed or performed during the hospital encounter of 08/01/21  Comprehensive metabolic panel  Result Value Ref Range   Sodium 132 (L) 135 - 145 mmol/L   Potassium 4.7 3.5 - 5.1 mmol/L   Chloride 101 98 - 111 mmol/L   CO2 26 22 - 32 mmol/L   Glucose, Bld 85 70 - 99 mg/dL   BUN 17 6 - 20 mg/dL   Creatinine, Ser 6.46 0.44 - 1.00 mg/dL   Calcium 8.9 8.9 - 80.3 mg/dL   Total Protein 8.0 6.5 - 8.1 g/dL   Albumin 4.5 3.5 - 5.0 g/dL   AST 17 15 - 41 U/L   ALT 10 0 - 44 U/L   Alkaline Phosphatase 66 38 - 126 U/L   Total Bilirubin 0.5 0.3 - 1.2 mg/dL   GFR, Estimated >21 >22 mL/min   Anion gap 5 5 - 15  CBC  Result Value Ref Range   WBC 8.3 4.0 - 10.5 K/uL   RBC 4.23 3.87 - 5.11 MIL/uL   Hemoglobin 12.9 12.0 - 15.0 g/dL   HCT 48.2 50.0 - 37.0 %   MCV 95.5 80.0 - 100.0 fL   MCH 30.5 26.0 - 34.0 pg   MCHC 31.9 30.0 - 36.0 g/dL   RDW 48.8 89.1 - 69.4 %   Platelets 201 150 - 400 K/uL   nRBC  0.0 0.0 - 0.2 %  Urine Drug Screen, Qualitative ([facility] only)  Result Value Ref Range   Tricyclic, Ur Screen POSITIVE (A) NONE DETECTED   Amphetamines, Ur Screen NONE DETECTED NONE DETECTED   MDMA (Ecstasy)Ur Screen NONE DETECTED NONE DETECTED   Cocaine Metabolite,Ur [place] NONE DETECTED NONE DETECTED   Opiate, Ur Screen NONE DETECTED NONE DETECTED   Phencyclidine (PCP) Ur S NONE DETECTED NONE DETECTED   Cannabinoid 50 Ng, Ur [place] POSITIVE (A) NONE DETECTED   Barbiturates, Ur Screen NONE DETECTED NONE DETECTED   Benzodiazepine, Ur Scrn POSITIVE (A) NONE DETECTED   Methadone Scn, Ur NONE DETECTED NONE DETECTED       Assessment & Plan:   Problem List Items Addressed This Visit       Cardiovascular and Mediastinum   Hemorrhoids    Noted on exam -- was scheduled for surgery but this is on hold at this time.  Monitor closely.  Lab check today.        Nervous and Auditory   Seizure disorder (HCC) - Primary    New diagnosis reported, followed by neurology -- have discussed at length with patient concern related to current discontinuation of medications, but she reports she tapered off of these and did not cold [place].  UDS in hospital + benzo and MJ.  At this time continue collaboration with neurology, would benefit from EEG.  MRI was reassuring recently.  She is very agitated at this time, discussed at length plan with her and goal to avoid medications that could further decrease seizure threshold.  Labs today, including hormone labs per her request.      Relevant Medications   gabapentin (NEURONTIN) 300 MG capsule   lamoTRIgine (LAMICTAL) 25 MG tablet (Start on 08/27/2021)   Other Relevant Orders   CBC with Differential/Platelet   Comprehensive metabolic panel   TSH   Vitamin B12   FSH/LH   Prolactin   Uric acid     Other   ADHD    At this time is holding off on her medication and not taking she reports.  Have discussed at length with her risks with use of Klonopin and Vyvanse -- at this time discussed with her need to reduce to discontinue this script (Klonopin) slow reduction and maintain Vyvanse if she decides in future to use.  A verbal agreement was made to work on this reduction to discontinuation while initiating Vyvanse.  Controlled substance contract and UDS up to date.  Would benefit from psychiatry referral in future.      Anxiety and depression    Chronic, ongoing, followed by neurology with Klonopin ordered.  She is aware of long term risks of chronic benzo use and aware this script needs to come from neurology, as they initiated this treatment.  At this time discussed with her  need to reduce to discontinue this script as she wishes to continue on medication for her ADHD.  A verbal agreement was made to work on this reduction to discontinuation while initiating Vyvanse in future.  Controlled substance contract and UDS up to date -- had at length discussion with her about no MJ use, this was noted again on recent drug screening.  Would benefit psychiatry referral in future.      Back pain    Chronic, with current acute pain reported after falls with possible seizure activity.  At this time discussed with patient to utilize lidocaine patches over the counter + creams & Tylenol as needed + heat pad.  Will not fill any opioids at this time, which patient appear agitated about, explained to her concern for lowering seizure threshold with some narcotics, such as Tramadol, plus concern with her memory changes recently of affecting this more and increasing fatigue putting at risk for increased falls.  At this time will order imaging lower back and further determine plan after these return.        Relevant Orders   DG Lumbar Spine Complete   Family history of genetic disease    Father has hereditary spastic paraplegia -- will check on November referral to [facility], as patient has not heard from specialist and she has concerns about this.      Postoperative anemia    Noted on labs post-op, recent ER labs showed some improvement in H/H.  She is not taking iron supplement daily, recommend she continue supplement daily due to levels improving.      Protein-calorie malnutrition, moderate (HCC)    Ongoing, underweight, but has gained 3 pounds.  Continue supplements at home and monitor weight closely.  Denies any purging/binging or restrictions.          Follow up plan: Return in about 3 weeks (around 08/25/2021) for Seizures.

## 2021-08-04 NOTE — Patient Instructions (Addendum)
[address], [place], [place] [address]  Seizure, Adult A seizure is a sudden burst of abnormal electrical and chemical activity in the brain. Seizures usually last from 30 seconds to 2 minutes.  What are the causes? Common causes of this condition include: Fever or infection. Problems that affect the brain. These may include: A brain or head injury. Bleeding in the brain. A brain tumor. Low levels of blood sugar or salt. Kidney problems or liver problems. Conditions that are passed from parent to child (are inherited). Problems with a substance, such as: Having a reaction to a drug or a medicine. Stopping the use of a substance all of a sudden (withdrawal). A stroke. Disorders that affect how you develop. Sometimes, the cause may not be known.  What increases the risk? Having someone in your family who has epilepsy. In this condition, seizures happen again and again over time. They have no clear cause. Having had a tonic-clonic seizure before. This type of seizure causes you to: Tighten the muscles of the whole body. Lose consciousness. Having had a head injury or strokes before. Having had a lack of oxygen at birth. What are the signs or symptoms? There are many types of seizures. The symptoms vary depending on the type of seizure you have. Symptoms during a seizure Shaking that you cannot control (convulsions) with fast, jerky movements of muscles. Stiffness of the body. Breathing problems. Feeling mixed up (confused). Staring or not responding to sound or touch. Head nodding. Eyes that blink, flutter, or move fast. Drooling, grunting, or making clicking sounds with your mouth Losing control of when you pee or poop. Symptoms before a seizure Feeling afraid, nervous, or worried. Feeling like you may vomit. Feeling like: You are moving when you are not. Things around you are moving when they are not. Feeling like you saw or heard something before (dj  vu). Odd tastes or smells. Changes in how you see. You may see flashing lights or spots. Symptoms after a seizure Feeling confused. Feeling sleepy. Headache. Sore muscles. How is this treated? If your seizure stops on its own, you will not need treatment. If your seizure lasts longer than 5 minutes, you will normally need treatment. Treatment may include: Medicines given through an IV tube. Avoiding things, such as medicines, that are known to cause your seizures. Medicines to prevent seizures. A device to prevent or control seizures. Surgery. A diet low in carbohydrates and high in fat (ketogenic diet). Follow these instructions at home: Medicines Take over-the-counter and prescription medicines only as told by your doctor. Avoid foods or drinks that may keep your medicine from working, such as alcohol. Activity Follow instructions about driving, swimming, or doing things that would be dangerous if you had another seizure. Wait until your doctor says it is safe for you to do these things. If you live in the [place] ask your local department of motor vehicles when you can drive. Get a lot of rest. Teaching others  Teach friends and family what to do when you have a seizure. They should: Help you get down to the ground. Protect your head and body. Loosen any clothing around your neck. Turn you on your side. Know whether or not you need emergency care. Stay with you until you are better. Also, tell them what not to do if you have a seizure. Tell them: They should not hold you down. They should not put anything in your mouth. General instructions Avoid anything that gives you seizures.  Keep a seizure diary. Write down: What you remember about each seizure. What you think caused each seizure. Keep all follow-up visits. Contact a doctor if: You have another seizure or seizures. Call the doctor each time you have a seizure. The pattern of your seizures changes. You keep having  seizures with treatment. You have symptoms of being sick or having an infection. You are not able to take your medicine. Get help right away if: You have any of these problems: A seizure that lasts longer than 5 minutes. Many seizures in a row and you do not feel better between seizures. A seizure that makes it harder to breathe. A seizure and you can no longer speak or use part of your body. You do not wake up right after a seizure. You get hurt during a seizure. You feel confused or have pain right after a seizure. These symptoms may be an emergency. Get help right away. Call your local emergency services (911 in the [place] Do not wait to see if the symptoms will go away. Do not drive yourself to the hospital. Summary A seizure is a sudden burst of abnormal electrical and chemical activity in the brain. Seizures normally last from 30 seconds to 2 minutes. Causes of seizures include illness, injury to the head, low levels of blood sugar or salt, and certain conditions. Most seizures will stop on their own in less than 5 minutes. Seizures that last longer than 5 minutes are a medical emergency and need treatment right away. Many medicines are used to treat seizures. Take over-the-counter and prescription medicines only as told by your doctor. This information is not intended to replace advice given to you by your health care provider. Make sure you discuss any questions you have with your health care provider. Document Revised: 12/07/2019 Document Reviewed: 12/07/2019 Elsevier Patient Education  2022 [employer].

## 2021-08-04 NOTE — Assessment & Plan Note (Signed)
Father has hereditary spastic paraplegia -- will check on November referral to [facility], as patient has not heard from specialist and she has concerns about this.

## 2021-08-05 DIAGNOSIS — R630 Anorexia: Secondary | ICD-10-CM | POA: Diagnosis not present

## 2021-08-05 DIAGNOSIS — R200 Anesthesia of skin: Secondary | ICD-10-CM | POA: Diagnosis not present

## 2021-08-05 DIAGNOSIS — R42 Dizziness and giddiness: Secondary | ICD-10-CM | POA: Diagnosis not present

## 2021-08-05 DIAGNOSIS — R55 Syncope and collapse: Secondary | ICD-10-CM | POA: Diagnosis not present

## 2021-08-05 LAB — CBC WITH DIFFERENTIAL/PLATELET
Basophils Absolute: 0 10*3/uL (ref 0.0–0.2)
Basos: 0 %
EOS (ABSOLUTE): 0.1 10*3/uL (ref 0.0–0.4)
Eos: 3 %
Hematocrit: 32.7 % — ABNORMAL LOW (ref 34.0–46.6)
Hemoglobin: 11 g/dL — ABNORMAL LOW (ref 11.1–15.9)
Immature Grans (Abs): 0 10*3/uL (ref 0.0–0.1)
Immature Granulocytes: 0 %
Lymphocytes Absolute: 1.2 10*3/uL (ref 0.7–3.1)
Lymphs: 29 %
MCH: 31.2 pg (ref 26.6–33.0)
MCHC: 33.6 g/dL (ref 31.5–35.7)
MCV: 93 fL (ref 79–97)
Monocytes Absolute: 0.5 10*3/uL (ref 0.1–0.9)
Monocytes: 11 %
Neutrophils Absolute: 2.4 10*3/uL (ref 1.4–7.0)
Neutrophils: 57 %
Platelets: 178 10*3/uL (ref 150–450)
RBC: 3.53 x10E6/uL — ABNORMAL LOW (ref 3.77–5.28)
RDW: 11.9 % (ref 11.7–15.4)
WBC: 4.2 10*3/uL (ref 3.4–10.8)

## 2021-08-05 LAB — VITAMIN B12: Vitamin B-12: 290 pg/mL (ref 232–1245)

## 2021-08-05 LAB — COMPREHENSIVE METABOLIC PANEL
ALT: 9 IU/L (ref 0–32)
AST: 13 IU/L (ref 0–40)
Albumin/Globulin Ratio: 2 (ref 1.2–2.2)
Albumin: 4.3 g/dL (ref 3.8–4.8)
Alkaline Phosphatase: 62 IU/L (ref 44–121)
BUN/Creatinine Ratio: 19 (ref 9–23)
BUN: 14 mg/dL (ref 6–20)
Bilirubin Total: <0.2 mg/dL (ref 0.0–1.2)
CO2: 23 mmol/L (ref 20–29)
Calcium: 8.6 mg/dL — ABNORMAL LOW (ref 8.7–10.2)
Chloride: 103 mmol/L (ref 96–106)
Creatinine, Ser: 0.74 mg/dL (ref 0.57–1.00)
Globulin, Total: 2.2 g/dL (ref 1.5–4.5)
Glucose: 95 mg/dL (ref 70–99)
Potassium: 4.2 mmol/L (ref 3.5–5.2)
Sodium: 139 mmol/L (ref 134–144)
Total Protein: 6.5 g/dL (ref 6.0–8.5)
eGFR: 109 mL/min/{1.73_m2} (ref >59)

## 2021-08-05 LAB — URIC ACID: Uric Acid: 6.5 mg/dL — ABNORMAL HIGH (ref 2.6–6.2)

## 2021-08-05 LAB — PROLACTIN: Prolactin: 5.4 ng/mL (ref 4.8–23.3)

## 2021-08-05 LAB — FSH/LH
FSH: 3.9 m[IU]/mL
LH: 12.9 m[IU]/mL

## 2021-08-05 LAB — TSH: TSH: 1.48 u[IU]/mL (ref 0.450–4.500)

## 2021-08-05 MED ORDER — ALLOPURINOL 100 MG PO TABS: 100.0000 mg | ORAL_TABLET | Freq: Every day | ORAL | 6 refills | Status: DC

## 2021-08-05 MED ORDER — VITAMIN B-12 1000 MCG PO TABS: 1000.0000 ug | ORAL_TABLET | Freq: Every day | ORAL | 4 refills | Status: DC

## 2021-08-05 NOTE — Addendum Note (Signed)
Addended by: Aura Dials T on: 08/05/2021 12:28 PM   Modules accepted: Orders

## 2021-08-05 NOTE — Progress Notes (Signed)
Contacted via [place] [facility] morning Dinita, your labs have returned: - CBC shows some mild anemia again on hemoglobin/hematocrit -- please ensure you are taking your iron tablets daily while you are healing. - Kidney function, creatinine and eGFR, remains normal, as is liver function, AST and ALT.   - Calcium is a little low, ensure you are getting good calcium intake daily via milk or milk products. - Uric acid level a little elevated, we do see this with gouty types of arthritis at times.  With your pain levels, I would like to try starting a low dose of Allopurinol to see if lowering this level helps pain.  I will send this in and we will recheck next visit. - B12 level on low end of normal, will send in B12 supplement for you to start which is good for nervous system health.   - Remainder of labs all nice and stable -- any questions? Keep being amazing!!  Thank you for allowing me to participate in your care.  I appreciate you. Kindest regards, Fern Canova

## 2021-08-11 DIAGNOSIS — F411 Generalized anxiety disorder: Secondary | ICD-10-CM | POA: Diagnosis not present

## 2021-08-11 DIAGNOSIS — R42 Dizziness and giddiness: Secondary | ICD-10-CM | POA: Diagnosis not present

## 2021-08-11 DIAGNOSIS — R55 Syncope and collapse: Secondary | ICD-10-CM | POA: Diagnosis not present

## 2021-08-11 DIAGNOSIS — R200 Anesthesia of skin: Secondary | ICD-10-CM | POA: Diagnosis not present

## 2021-08-12 DIAGNOSIS — Z419 Encounter for procedure for purposes other than remedying health state, unspecified: Secondary | ICD-10-CM | POA: Diagnosis not present

## 2021-08-14 ENCOUNTER — Encounter: Payer: Self-pay | Admitting: Nurse Practitioner

## 2021-08-20 MED ORDER — LISDEXAMFETAMINE DIMESYLATE 30 MG PO CAPS: 30.0000 mg | ORAL_CAPSULE | Freq: Every day | ORAL | 0 refills | Status: DC

## 2021-08-23 NOTE — Patient Instructions (Incomplete)
Seizure, Adult A seizure is a sudden burst of abnormal electrical and chemical activity in the brain. Seizures usually last from 30 seconds to 2 minutes.  What are the causes? Common causes of this condition include: Fever or infection. Problems that affect the brain. These may include: A brain or head injury. Bleeding in the brain. A brain tumor. Low levels of blood sugar or salt. Kidney problems or liver problems. Conditions that are passed from parent to child (are inherited). Problems with a substance, such as: Having a reaction to a drug or a medicine. Stopping the use of a substance all of a sudden (withdrawal). A stroke. Disorders that affect how you develop. Sometimes, the cause may not be known.  What increases the risk? Having someone in your family who has epilepsy. In this condition, seizures happen again and again over time. They have no clear cause. Having had a tonic-clonic seizure before. This type of seizure causes you to: Tighten the muscles of the whole body. Lose consciousness. Having had a head injury or strokes before. Having had a lack of oxygen at birth. What are the signs or symptoms? There are many types of seizures. The symptoms vary depending on the type of seizure you have. Symptoms during a seizure Shaking that you cannot control (convulsions) with fast, jerky movements of muscles. Stiffness of the body. Breathing problems. Feeling mixed up (confused). Staring or not responding to sound or touch. Head nodding. Eyes that blink, flutter, or move fast. Drooling, grunting, or making clicking sounds with your mouth Losing control of when you pee or poop. Symptoms before a seizure Feeling afraid, nervous, or worried. Feeling like you may vomit. Feeling like: You are moving when you are not. Things around you are moving when they are not. Feeling like you saw or heard something before (dj vu). Odd tastes or smells. Changes in how you see. You may  see flashing lights or spots. Symptoms after a seizure Feeling confused. Feeling sleepy. Headache. Sore muscles. How is this treated? If your seizure stops on its own, you will not need treatment. If your seizure lasts longer than 5 minutes, you will normally need treatment. Treatment may include: Medicines given through an IV tube. Avoiding things, such as medicines, that are known to cause your seizures. Medicines to prevent seizures. A device to prevent or control seizures. Surgery. A diet low in carbohydrates and high in fat (ketogenic diet). Follow these instructions at home: Medicines Take over-the-counter and prescription medicines only as told by your doctor. Avoid foods or drinks that may keep your medicine from working, such as alcohol. Activity Follow instructions about driving, swimming, or doing things that would be dangerous if you had another seizure. Wait until your doctor says it is safe for you to do these things. If you live in the [place] ask your local department of motor vehicles when you can drive. Get a lot of rest. Teaching others  Teach friends and family what to do when you have a seizure. They should: Help you get down to the ground. Protect your head and body. Loosen any clothing around your neck. Turn you on your side. Know whether or not you need emergency care. Stay with you until you are better. Also, tell them what not to do if you have a seizure. Tell them: They should not hold you down. They should not put anything in your mouth. General instructions Avoid anything that gives you seizures. Keep a seizure diary. Write down: What you remember  about each seizure. °What you think caused each seizure. °Keep all follow-up visits. °Contact a doctor if: °You have another seizure or seizures. Call the doctor each time you have a seizure. °The pattern of your seizures changes. °You keep having seizures with treatment. °You have symptoms of being sick or  having an infection. °You are not able to take your medicine. °Get help right away if: °You have any of these problems: °A seizure that lasts longer than 5 minutes. °Many seizures in a row and you do not feel better between seizures. °A seizure that makes it harder to breathe. °A seizure and you can no longer speak or use part of your body. °You do not wake up right after a seizure. °You get hurt during a seizure. °You feel confused or have pain right after a seizure. °These symptoms may be an emergency. Get help right away. Call your local emergency services (911 in the [place] °Do not wait to see if the symptoms will go away. °Do not drive yourself to the hospital. °Summary °A seizure is a sudden burst of abnormal electrical and chemical activity in the brain. Seizures normally last from 30 seconds to 2 minutes. °Causes of seizures include illness, injury to the head, low levels of blood sugar or salt, and certain conditions. °Most seizures will stop on their own in less than 5 minutes. Seizures that last longer than 5 minutes are a medical emergency and need treatment right away. °Many medicines are used to treat seizures. Take over-the-counter and prescription medicines only as told by your doctor. °This information is not intended to replace advice given to you by your health care provider. Make sure you discuss any questions you have with your health care provider. °Document Revised: 12/07/2019 Document Reviewed: 12/07/2019 °Elsevier Patient Education © 2022 [employer] ° °

## 2021-08-27 ENCOUNTER — Encounter: Payer: Self-pay | Admitting: Nurse Practitioner

## 2021-08-27 ENCOUNTER — Ambulatory Visit (INDEPENDENT_AMBULATORY_CARE_PROVIDER_SITE_OTHER): Payer: Medicaid Other | Admitting: Nurse Practitioner

## 2021-08-27 ENCOUNTER — Other Ambulatory Visit: Payer: Self-pay

## 2021-08-27 VITALS — BP 103/70 | HR 81 | Ht 64.0 in | Wt 95.4 lb

## 2021-08-27 DIAGNOSIS — F902 Attention-deficit hyperactivity disorder, combined type: Secondary | ICD-10-CM

## 2021-08-27 DIAGNOSIS — R591 Generalized enlarged lymph nodes: Secondary | ICD-10-CM | POA: Diagnosis not present

## 2021-08-27 DIAGNOSIS — G40909 Epilepsy, unspecified, not intractable, without status epilepticus: Secondary | ICD-10-CM | POA: Diagnosis not present

## 2021-08-27 DIAGNOSIS — F419 Anxiety disorder, unspecified: Secondary | ICD-10-CM | POA: Diagnosis not present

## 2021-08-27 DIAGNOSIS — F32A Depression, unspecified: Secondary | ICD-10-CM | POA: Diagnosis not present

## 2021-08-27 DIAGNOSIS — R55 Syncope and collapse: Secondary | ICD-10-CM | POA: Diagnosis not present

## 2021-08-27 DIAGNOSIS — R413 Other amnesia: Secondary | ICD-10-CM | POA: Diagnosis not present

## 2021-08-27 DIAGNOSIS — R569 Unspecified convulsions: Secondary | ICD-10-CM | POA: Diagnosis not present

## 2021-08-27 DIAGNOSIS — R200 Anesthesia of skin: Secondary | ICD-10-CM | POA: Diagnosis not present

## 2021-08-27 DIAGNOSIS — L729 Follicular cyst of the skin and subcutaneous tissue, unspecified: Secondary | ICD-10-CM | POA: Diagnosis not present

## 2021-08-27 NOTE — Assessment & Plan Note (Addendum)
Chronic, ongoing, followed by neurology with Klonopin ordered.  She is aware of long term risks of chronic benzo use and aware this script needs to come from neurology, as they initiated this treatment.  At this time discussed with her need to reduce to discontinue this script as she wishes to continue on medication for her ADHD -- discussed at length long term effects of taking both of these together.  A verbal agreement was made to work on this reduction to discontinuation while on Vyvanse.  Controlled substance contract and UDS up to date -- had at length discussion with her about no MJ use, this was noted again on recent drug screening.  Would benefit psychiatry referral in future, she refuses this. ?

## 2021-08-27 NOTE — Progress Notes (Signed)
? ?BP 103/70   Pulse 81   Ht 5\' 4"  (1.626 m)   Wt 95 lb 6.4 oz (43.3 kg)   LMP 06/11/2021 (Approximate)   SpO2 99%   BMI 16.38 kg/m?   ? ?Subjective:  ? ? Patient ID: Megan Bentley, female    DOB: [birth year], 37 y.o.   MRN: [id number] ? ?HPI: ?Megan Bentley is a 36 y.o. female ? ?Chief Complaint  ?Patient presents with  ? Seizures  ?  Patient is here for follow up on Seizures. Patient states she would like to discuss with the provider to feel for her swollen lymph nodes and she would like to discuss having her blood tested for infection.   ? ?DEPRESSION/ADHD/ANXIETY ?She is followed by neurology.  MRI done in 08/01/21 and was normal. Suspected to have seizures and neurology has placed her on Lamictal and back on Gabapentin.  ?  ?She continues in medication lock in program through [place] where it is stating she can only have two providers prescribing controlled substances -- PCP and neurology.  Lock in for two years she reports.  ?  ?Has Vyvanse for ADHD, reported she was starting back to school soon -- we have had at length discussions that provider would like her to decrease to discontinue Klonopin while taking this, as long term regimen would benefit from this change.  There was verbal agreement to work on this change.  She reports taking 1/2 Klonopin in morning and at night right now. ?  ?Continues on Klonopin (last fill 08/12/21) as ordered by neurology, however we discussed at length last visit goal to work on discontinuing this over next few months due to having Vyvanse on board (last fill 08/20/21). ?  ?She is also followed by neurology for multiple concerns noted above. Her father has hereditary spastic paraplegia = has referral to same provider at [facility] her father sees to assess this. ?Mood status: controlled ?Satisfied with current treatment?: yes ?Symptom severity: mild  ?Duration of current treatment : chronic ?Side effects: no ?Medication compliance: good compliance ?Psychotherapy/counseling:  none ?Previous psychiatric medications: multiple medications ?Depressed mood: no ?Anxious mood: no ?Anhedonia: no ?Significant weight loss or gain: no ?Insomnia: none ?Fatigue: no ?Feelings of worthlessness or guilt: no ?Impaired concentration/indecisiveness: no ?Suicidal ideations: no ?Hopelessness: no ?Crying spells: no ?Depression screen [facility] 2/9 08/27/2021 08/04/2021 07/06/2021 05/11/2021 06/26/2020  ?Decreased Interest 1 1 0 1 1  ?Down, Depressed, Hopeless 0 0 0 1 1  ?PHQ - 2 Score 1 1 0 2 2  ?Altered sleeping 0 3 0 0 1  ?Tired, decreased energy 1 3 3 1 1   ?Change in appetite 0 3 1 1 1   ?Feeling bad or failure about yourself  1 2 0 0 1  ?Trouble concentrating 1 3 0 0 0  ?Moving slowly or fidgety/restless 0 0 0 0 0  ?Suicidal thoughts 0 0 0 0 0  ?PHQ-9 Score 4 15 4 4 6   ?Difficult doing work/chores - - Not difficult at all Somewhat difficult -  ?  ?GAD 7 : Generalized Anxiety Score 08/27/2021 08/04/2021 07/06/2021 05/11/2021  ?Nervous, Anxious, on Edge 2 2 2 3   ?Control/stop worrying 1 3 3 3   ?Worry too much - different things 1 3 3 3   ?Trouble relaxing 1 3 2 3   ?Restless 0 1 3 2   ?Easily annoyed or irritable 1 2 1 3   ?Afraid - awful might happen 3 3 3 3   ?Total GAD 7 Score 9 17 17 20   ?  Anxiety Difficulty Extremely difficult Extremely difficult Somewhat difficult Somewhat difficult  ? ?LYMPH NODE SWELLING ?A week ago noticed to front of neck and back of head.  No illness recently.  Denies B symptoms. ?Duration: weeks ?Location: multiple areas ?Pain: no ?Redness: no ?Swelling: yes ?Fevers: no ?Nausea/vomiting: no ?Status: stable ?Treatments attempted:none  ?Tetanus: UTD ? ?Relevant past medical, surgical, family and social history reviewed and updated as indicated. Interim medical history since our last visit reviewed. ?Allergies and medications reviewed and updated. ? ?Review of Systems  ?Constitutional:  Negative for activity change, appetite change, diaphoresis, fatigue and fever.  ?Respiratory:  Negative for cough,  chest tightness and shortness of breath.   ?Cardiovascular:  Negative for chest pain, palpitations and leg swelling.  ?Gastrointestinal: Negative.   ?Neurological: Negative.   ?Psychiatric/Behavioral:  Negative for decreased concentration, self-injury, sleep disturbance and suicidal ideas. The patient is not nervous/anxious.   ? ?Per HPI unless specifically indicated above ? ?   ?Objective:  ?  ?BP 103/70   Pulse 81   Ht 5\' 4"  (1.626 m)   Wt 95 lb 6.4 oz (43.3 kg)   LMP 06/11/2021 (Approximate)   SpO2 99%   BMI 16.38 kg/m?   ?Wt Readings from Last 3 Encounters:  ?08/27/21 95 lb 6.4 oz (43.3 kg)  ?08/04/21 92 lb 6.4 oz (41.9 kg)  ?08/01/21 89 lb (40.4 kg)  ?  ?Physical Exam ?Vitals and nursing note reviewed.  ?Constitutional:   ?   General: She is awake. She is not in acute distress. ?   Appearance: She is well-developed and well-groomed. She is not ill-appearing or toxic-appearing.  ?HENT:  ?   Head: Normocephalic.  ? ?   Right Ear: Hearing, ear canal and external ear normal. No drainage.  ?   Left Ear: Hearing, ear canal and external ear normal. No drainage.  ?Eyes:  ?   General: Lids are normal.     ?   Right eye: No discharge.     ?   Left eye: No discharge.  ?   Extraocular Movements: Extraocular movements intact.  ?   Conjunctiva/sclera: Conjunctivae normal.  ?   Pupils: Pupils are equal, round, and reactive to light.  ?   Visual Fields: Right eye visual fields normal and left eye visual fields normal.  ?Neck:  ?   Thyroid: No thyromegaly.  ?   Vascular: No carotid bruit or JVD.  ?Cardiovascular:  ?   Rate and Rhythm: Normal rate and regular rhythm.  ?   Heart sounds: Normal heart sounds. No murmur heard. ?  No gallop.  ?Pulmonary:  ?   Effort: Pulmonary effort is normal.  ?   Breath sounds: Normal breath sounds.  ?Abdominal:  ?   General: Bowel sounds are normal.  ?   Palpations: Abdomen is soft.  ?Musculoskeletal:  ?   Cervical back: Normal range of motion and neck supple.  ?   Right lower leg: No  edema.  ?   Left lower leg: No edema.  ?Lymphadenopathy:  ?   Head:  ?   Right side of head: No submental, submandibular, tonsillar, preauricular or posterior auricular adenopathy.  ?   Left side of head: Submental adenopathy present. No submandibular, tonsillar, preauricular or posterior auricular adenopathy.  ?   Cervical: No cervical adenopathy.  ?Skin: ?   General: Skin is warm and dry.  ?Neurological:  ?   Mental Status: She is alert and oriented to person, place, and time.  ?  Sensory: Sensation is intact.  ?   Motor: Motor function is intact.  ?   Coordination: Coordination is intact.  ?   Gait: Gait is intact.  ?   Deep Tendon Reflexes: Reflexes are normal and symmetric.  ?   Reflex Scores: ?     Brachioradialis reflexes are 2+ on the right side and 2+ on the left side. ?     Patellar reflexes are 2+ on the right side and 2+ on the left side. ?Psychiatric:     ?   Attention and Perception: Attention normal.     ?   Mood and Affect: Mood normal.     ?   Speech: Speech normal.     ?   Behavior: Behavior is cooperative.     ?   Thought Content: Thought content normal.  ? ?Results for orders placed or performed in visit on 08/04/21  ?CBC with Differential/Platelet  ?Result Value Ref Range  ? WBC 4.2 3.4 - 10.8 x10E3/uL  ? RBC 3.53 (L) 3.77 - 5.28 x10E6/uL  ? Hemoglobin 11.0 (L) 11.1 - 15.9 g/dL  ? Hematocrit 32.7 (L) 34.0 - 46.6 %  ? MCV 93 79 - 97 fL  ? MCH 31.2 26.6 - 33.0 pg  ? MCHC 33.6 31.5 - 35.7 g/dL  ? RDW 11.9 11.7 - 15.4 %  ? Platelets 178 150 - 450 x10E3/uL  ? Neutrophils 57 Not Estab. %  ? Lymphs 29 Not Estab. %  ? Monocytes 11 Not Estab. %  ? Eos 3 Not Estab. %  ? Basos 0 Not Estab. %  ? Neutrophils Absolute 2.4 1.4 - 7.0 x10E3/uL  ? Lymphocytes Absolute 1.2 0.7 - 3.1 x10E3/uL  ? Monocytes Absolute 0.5 0.1 - 0.9 x10E3/uL  ? EOS (ABSOLUTE) 0.1 0.0 - 0.4 x10E3/uL  ? Basophils Absolute 0.0 0.0 - 0.2 x10E3/uL  ? Immature Granulocytes 0 Not Estab. %  ? Immature Grans (Abs) 0.0 0.0 - 0.1 x10E3/uL   ?Comprehensive metabolic panel  ?Result Value Ref Range  ? Glucose 95 70 - 99 mg/dL  ? BUN 14 6 - 20 mg/dL  ? Creatinine, Ser 0.74 0.57 - 1.00 mg/dL  ? eGFR 109 >59 mL/min/1.73  ? BUN/Creatinine Ratio 19 9 - 23  ? Sodium 139 134 -

## 2021-08-27 NOTE — Assessment & Plan Note (Addendum)
New diagnosis, followed by neurology.  At this time continue collaboration with neurology, would benefit from EEG.  MRI was reassuring recently.  Discussed at length plan with her and goal to avoid medications that could further decrease seizure threshold.  Labs today. ?

## 2021-08-27 NOTE — Assessment & Plan Note (Signed)
At this time is holding off on her medication and not taking she reports.  Have discussed at length with her risks with use of Klonopin and Vyvanse together -- at this time discussed with her need to reduce to discontinue this script (Klonopin) slow reduction and maintain Vyvanse if she decides in future to use.  A verbal agreement was made to work on this reduction to discontinuation while on Vyvanse.  Controlled substance contract and UDS up to date.  Would benefit from psychiatry referral in future, refuses this. ?

## 2021-08-27 NOTE — Assessment & Plan Note (Signed)
Left posterior occiput.  No symptoms.  Monitor only, discussed with patient. ?

## 2021-08-28 ENCOUNTER — Encounter: Payer: Self-pay | Admitting: Nurse Practitioner

## 2021-08-28 LAB — COMPREHENSIVE METABOLIC PANEL
ALT: 9 IU/L (ref 0–32)
AST: 14 IU/L (ref 0–40)
Albumin/Globulin Ratio: 2 (ref 1.2–2.2)
Albumin: 4.9 g/dL — ABNORMAL HIGH (ref 3.8–4.8)
Alkaline Phosphatase: 62 IU/L (ref 44–121)
BUN/Creatinine Ratio: 17 (ref 9–23)
BUN: 14 mg/dL (ref 6–20)
Bilirubin Total: 0.6 mg/dL (ref 0.0–1.2)
CO2: 23 mmol/L (ref 20–29)
Calcium: 9.5 mg/dL (ref 8.7–10.2)
Chloride: 98 mmol/L (ref 96–106)
Creatinine, Ser: 0.84 mg/dL (ref 0.57–1.00)
Globulin, Total: 2.4 g/dL (ref 1.5–4.5)
Glucose: 99 mg/dL (ref 70–99)
Potassium: 4.4 mmol/L (ref 3.5–5.2)
Sodium: 136 mmol/L (ref 134–144)
Total Protein: 7.3 g/dL (ref 6.0–8.5)
eGFR: 94 mL/min/{1.73_m2} (ref >59)

## 2021-08-28 LAB — CBC WITH DIFFERENTIAL/PLATELET
Basophils Absolute: 0 10*3/uL (ref 0.0–0.2)
Basos: 1 %
EOS (ABSOLUTE): 0 10*3/uL (ref 0.0–0.4)
Eos: 1 %
Hematocrit: 37.3 % (ref 34.0–46.6)
Hemoglobin: 12.1 g/dL (ref 11.1–15.9)
Immature Grans (Abs): 0 10*3/uL (ref 0.0–0.1)
Immature Granulocytes: 0 %
Lymphocytes Absolute: 1.3 10*3/uL (ref 0.7–3.1)
Lymphs: 38 %
MCH: 29.7 pg (ref 26.6–33.0)
MCHC: 32.4 g/dL (ref 31.5–35.7)
MCV: 92 fL (ref 79–97)
Monocytes Absolute: 0.3 10*3/uL (ref 0.1–0.9)
Monocytes: 7 %
Neutrophils Absolute: 1.9 10*3/uL (ref 1.4–7.0)
Neutrophils: 53 %
Platelets: 216 10*3/uL (ref 150–450)
RBC: 4.07 x10E6/uL (ref 3.77–5.28)
RDW: 12.7 % (ref 11.7–15.4)
WBC: 3.6 10*3/uL (ref 3.4–10.8)

## 2021-08-28 LAB — TSH: TSH: 1.45 u[IU]/mL (ref 0.450–4.500)

## 2021-08-28 NOTE — Progress Notes (Signed)
Contacted via [place] ? ? ?Good morning Itzy, overall all labs are nice and stable with no infection noted on CBC.  Kidney function, creatinine and eGFR, remains normal, as is liver function, AST and ALT.  Any questions? ?Keep being amazing!!  Thank you for allowing me to participate in your care.  I appreciate you. ?Kindest regards, ?Romolo Sieling ?

## 2021-09-12 DIAGNOSIS — Z419 Encounter for procedure for purposes other than remedying health state, unspecified: Secondary | ICD-10-CM | POA: Diagnosis not present

## 2021-09-14 MED ORDER — LISDEXAMFETAMINE DIMESYLATE 30 MG PO CAPS: 30.0000 mg | ORAL_CAPSULE | Freq: Every day | ORAL | 0 refills | Status: DC

## 2021-09-14 NOTE — Addendum Note (Signed)
Addended by: Marnee Guarneri T on: 09/14/2021 09:22 AM ? ? Modules accepted: Orders ? ?

## 2021-10-12 DIAGNOSIS — Z419 Encounter for procedure for purposes other than remedying health state, unspecified: Secondary | ICD-10-CM | POA: Diagnosis not present

## 2021-10-14 MED ORDER — LISDEXAMFETAMINE DIMESYLATE 30 MG PO CAPS: 30.0000 mg | ORAL_CAPSULE | Freq: Every day | ORAL | 0 refills | Status: DC

## 2021-10-14 NOTE — Addendum Note (Signed)
Addended by: Aura Dials T on: 10/14/2021 12:31 PM ? ? Modules accepted: Orders ? ?

## 2021-11-12 DIAGNOSIS — Z419 Encounter for procedure for purposes other than remedying health state, unspecified: Secondary | ICD-10-CM | POA: Diagnosis not present

## 2021-11-18 ENCOUNTER — Other Ambulatory Visit: Payer: Self-pay | Admitting: Nurse Practitioner

## 2021-11-18 MED ORDER — LISDEXAMFETAMINE DIMESYLATE 30 MG PO CAPS: 30.0000 mg | ORAL_CAPSULE | Freq: Every day | ORAL | 0 refills | Status: DC

## 2021-11-21 ENCOUNTER — Emergency Department: Payer: Medicaid Other

## 2021-11-21 ENCOUNTER — Other Ambulatory Visit: Payer: Self-pay

## 2021-11-21 ENCOUNTER — Emergency Department
Admission: EM | Admit: 2021-11-21 | Discharge: 2021-11-21 | Disposition: A | Discharge status: Home / Self Care | Payer: Medicaid Other | Attending: Emergency Medicine | Admitting: Emergency Medicine

## 2021-11-21 ENCOUNTER — Encounter: Payer: Self-pay | Admitting: Emergency Medicine

## 2021-11-21 DIAGNOSIS — R079 Chest pain, unspecified: Secondary | ICD-10-CM | POA: Insufficient documentation

## 2021-11-21 DIAGNOSIS — R002 Palpitations: Secondary | ICD-10-CM | POA: Insufficient documentation

## 2021-11-21 DIAGNOSIS — R202 Paresthesia of skin: Secondary | ICD-10-CM | POA: Diagnosis not present

## 2021-11-21 DIAGNOSIS — R200 Anesthesia of skin: Secondary | ICD-10-CM | POA: Diagnosis not present

## 2021-11-21 DIAGNOSIS — F418 Other specified anxiety disorders: Secondary | ICD-10-CM

## 2021-11-21 DIAGNOSIS — F419 Anxiety disorder, unspecified: Secondary | ICD-10-CM | POA: Diagnosis not present

## 2021-11-21 LAB — URINALYSIS, ROUTINE W REFLEX MICROSCOPIC
Bilirubin Urine: NEGATIVE
Glucose, UA: NEGATIVE mg/dL
Hgb urine dipstick: NEGATIVE
Ketones, ur: 20 mg/dL — AB
Nitrite: NEGATIVE
Protein, ur: NEGATIVE mg/dL
Specific Gravity, Urine: 1.024 (ref 1.005–1.030)
pH: 5 (ref 5.0–8.0)

## 2021-11-21 LAB — BASIC METABOLIC PANEL
Anion gap: 10 (ref 5–15)
BUN: 17 mg/dL (ref 6–20)
CO2: 25 mmol/L (ref 22–32)
Calcium: 9.3 mg/dL (ref 8.9–10.3)
Chloride: 102 mmol/L (ref 98–111)
Creatinine, Ser: 0.75 mg/dL (ref 0.44–1.00)
GFR, Estimated: >60 mL/min (ref >60)
Glucose, Bld: 132 mg/dL — ABNORMAL HIGH (ref 70–99)
Potassium: 3.2 mmol/L — ABNORMAL LOW (ref 3.5–5.1)
Sodium: 137 mmol/L (ref 135–145)

## 2021-11-21 LAB — CBC
HCT: 38.1 % (ref 36.0–46.0)
Hemoglobin: 12.5 g/dL (ref 12.0–15.0)
MCH: 30.1 pg (ref 26.0–34.0)
MCHC: 32.8 g/dL (ref 30.0–36.0)
MCV: 91.8 fL (ref 80.0–100.0)
Platelets: 265 10*3/uL (ref 150–400)
RBC: 4.15 MIL/uL (ref 3.87–5.11)
RDW: 14.2 % (ref 11.5–15.5)
WBC: 8.8 10*3/uL (ref 4.0–10.5)
nRBC: 0 % (ref 0.0–0.2)

## 2021-11-21 LAB — BETA-HYDROXYBUTYRIC ACID: Beta-Hydroxybutyric Acid: 0.1 mmol/L (ref 0.05–0.27)

## 2021-11-21 LAB — TROPONIN I (HIGH SENSITIVITY): Troponin I (High Sensitivity): 2 ng/L (ref <18)

## 2021-11-21 NOTE — ED Triage Notes (Signed)
Pt via POV from home. Pt c/o facial numbness, bilateral leg numbness, and arm numbness. Pt also feels like she has been having palpitations, states it feels like its been skipping a beat since Tuesday, states is been getting increasingly worse. States that 2 week ago she stopped her risperidone completely. Pt is A&Ox4 and NAD

## 2021-11-21 NOTE — ED Notes (Signed)
RN entered pt room due to call light on. Pt sts " My legs are numb and I cant feel them. I collapsed the other day. Something is very wrong with me." Pt is crying and shaking while explaining her concerns to RN. Pt sts " They dont believe me, that last time I was here they just wrote me off. Something is seriously wrong. See my heart rate just jumped from 80 to 110. See that is not normal." RN explained to pt that the provider will be notified.

## 2021-11-21 NOTE — ED Notes (Signed)
Pt given urine cup for urine sample. Pt sts " I trust the doctors decision but I just wanted to check for sepsis." Pt ambulated to bathroom with no assistance and back to room.

## 2021-11-21 NOTE — ED Notes (Signed)
Pt appears very anxious. RN notified provider

## 2021-11-21 NOTE — ED Provider Notes (Signed)
[facility] Provider Note   Event Date/Time   First MD Initiated Contact with Patient 11/21/21 1915     (approximate) History  Chest Pain and Numbness  HPI Megan Bentley is a 36 y.o. female  Location: Chest Duration: 1 week prior to arrival Timing: Intermittent but stable since onset Severity: Severe Quality: Palpitations Context: Patient states that she recently stopped all of her medications except for Lamictal including clonazepam and Risperdal prior to the symptoms beginning Modifying factors: Denies any specific exacerbating or relieving factors Associated Symptoms: Facial numbness, bilateral leg and arm numbness, anxiety ROS: Patient currently denies any vision changes, tinnitus, difficulty speaking, facial droop, sore throat, chest pain, shortness of breath, abdominal pain, nausea/vomiting/diarrhea, dysuria, or weakness/paresthesias in any extremity   Physical Exam  Triage Vital Signs: ED Triage Vitals  Enc Vitals Group     BP 11/21/21 1741 (!) 149/105     Pulse Rate 11/21/21 1741 96     Resp 11/21/21 1741 18     Temp 11/21/21 1741 98.5 F (36.9 C)     Temp Source 11/21/21 1741 Oral     SpO2 11/21/21 1741 98 %     Weight 11/21/21 1740 98 lb (44.5 kg)     Height 11/21/21 1740 5\' 4"  (1.626 m)     Head Circumference --      Peak Flow --      Pain Score 11/21/21 1739 0     Pain Loc --      Pain Edu? --      Excl. in GC? --    Most recent vital signs: Vitals:   11/21/21 1741 11/21/21 2000  BP: (!) 149/105 105/74  Pulse: 96 79  Resp: 18 (!) 22  Temp: 98.5 F (36.9 C)   SpO2: 98% 98%   General: Awake, oriented x4. CV:  Good peripheral perfusion.  Resp:  Normal effort.  Abd:  No distention.  Other:  Young adult Caucasian female laying in bed in no acute distress.  Clinically anxious ED Results / Procedures / Treatments  Labs (all labs ordered are listed, but only abnormal results are displayed) Labs Reviewed  BASIC METABOLIC PANEL -  Abnormal; Notable for the following components:      Result Value   Potassium 3.2 (*)    Glucose, Bld 132 (*)    All other components within normal limits  CBC  URINALYSIS, ROUTINE W REFLEX MICROSCOPIC  TROPONIN I (HIGH SENSITIVITY)  TROPONIN I (HIGH SENSITIVITY)   EKG ED ECG REPORT I, 01/21/22, the attending physician, personally viewed and interpreted this ECG. Date: 11/21/2021 EKG Time: 1743 Rate: 112 Rhythm: Tachycardic sinus rhythm QRS Axis: normal Intervals: normal ST/T Wave abnormalities: normal Narrative Interpretation: Tachycardic sinus rhythm.  No evidence of acute ischemia RADIOLOGY ED MD interpretation: CT of the head without contrast interpreted by me shows no evidence of acute abnormalities including no intracerebral hemorrhage, obvious masses, or significant edema  2 view chest x-ray interpreted by me shows no evidence of acute abnormalities including no pneumonia, pneumothorax, or widened mediastinum -Agree with radiology assessment Official radiology report(s): CT Head Wo Contrast  Result Date: 11/21/2021 CLINICAL DATA:  Facial and extremity paresthesias EXAM: CT HEAD WITHOUT CONTRAST TECHNIQUE: Contiguous axial images were obtained from the base of the skull through the vertex without intravenous contrast. RADIATION DOSE REDUCTION: This exam was performed according to the departmental dose-optimization program which includes automated exposure control, adjustment of the mA and/or kV according to patient size  and/or use of iterative reconstruction technique. COMPARISON:  04/28/2016 FINDINGS: Brain: No evidence of acute infarction, hemorrhage, hydrocephalus, extra-axial collection or mass lesion/mass effect. Vascular: No hyperdense vessel or unexpected calcification. Skull: Normal. Negative for fracture or focal lesion. Sinuses/Orbits: No acute finding. Other: None. IMPRESSION: Normal head CT without contrast for age. Electronically Signed   By: Judie Petit.  Shick M.D.   On:  11/21/2021 20:25   DG Chest 2 View  Result Date: 11/21/2021 CLINICAL DATA:  Bilateral leg numbness EXAM: CHEST - 2 VIEW COMPARISON:  None Available. FINDINGS: Normal mediastinum and cardiac silhouette. Normal pulmonary vasculature. No evidence of effusion, infiltrate, or pneumothorax. No acute bony abnormality. IMPRESSION: No acute cardiopulmonary process. Electronically Signed   By: Genevive Bi M.D.   On: 11/21/2021 18:04   PROCEDURES: Critical Care performed: No .1-3 Lead EKG Interpretation  Performed by: Merwyn Katos, MD Authorized by: Merwyn Katos, MD     Interpretation: normal     ECG rate:  78   ECG rate assessment: normal     Rhythm: sinus rhythm     Ectopy: none     Conduction: normal    MEDICATIONS ORDERED IN ED: Medications - No data to display IMPRESSION / MDM / ASSESSMENT AND PLAN / ED COURSE  I reviewed the triage vital signs and the nursing notes.                             The patient is on the cardiac monitor to evaluate for evidence of arrhythmia and/or significant heart rate changes. Patient's presentation is most consistent with acute presentation with potential threat to life or bodily function. 36 year old female presents with palpitations. EKG: No STEMI and no evidence of Brugadas sign, delta wave, epsilon wave, significantly prolonged QTc, or malignant arrhythmia. Based on H&P and testing, this patient appears to be low risk for emergent causes of palpitations such as, but not limited to, a malignant cardiac arrhythmia, ACS, pulmonary embolism, thyrotoxicosis, PNA, PTX. Discussed the possibility of the symptoms possibly relating to cessation of her normal medications and patient agrees to follow-up with her primary care physician for further management The patient has been given strict return precautions and understands the need for further outpatient testing and treatment.   FINAL CLINICAL IMPRESSION(S) / ED DIAGNOSES   Final diagnoses:   Numbness  Palpitations  Anxiety about health   Rx / DC Orders   ED Discharge Orders     None      Note:  This document was prepared using Dragon voice recognition software and may include unintentional dictation errors.   Merwyn Katos, MD 11/21/21 2141

## 2021-11-22 NOTE — Patient Instructions (Signed)
Living With Attention Deficit Hyperactivity Disorder If you have been diagnosed with attention deficit hyperactivity disorder (ADHD), you may be relieved that you now know why you have felt or behaved a certain way. Still, you may feel overwhelmed about the treatment ahead. You may also wonder how to get the support you need and how to deal with the condition day-to-day. With treatment and support, you can live with ADHD and manage your symptoms. How to manage lifestyle changes Managing stress Stress is your body's reaction to life changes and events, both good and bad. To cope with the stress of an ADHD diagnosis, it may help to: Learn more about ADHD. Exercise regularly. Even a short daily walk can lower stress levels. Participate in training or education programs (including social skills training classes) that teach you to deal with symptoms.  Medicines Your health care provider may suggest certain medicines if he or she feels that they will help to improve your condition. Stimulant medicines are usually prescribed to treat ADHD, and therapy may also be prescribed. It is important to: Avoid using alcohol and other substances that may prevent your medicines from working properly (may interact). Talk with your pharmacist or health care provider about all the medicines that you take, their possible side effects, and what medicines are safe to take together. Make it your goal to take part in all treatment decisions (shared decision-making). Ask about possible side effects of medicines that your health care provider recommends, and tell him or her how you feel about having those side effects. It is best if shared decision-making with your health care provider is part of your total treatment plan. Relationships To strengthen your relationships with family members while treating your condition, consider taking part in family therapy. You might also attend self-help groups alone or with a loved one. Be  honest about how your symptoms affect your relationships. Make an effort to communicate respectfully instead of fighting, and find ways to show others that you care. Psychotherapy may be useful in helping you cope with how ADHD affects your relationships. How to recognize changes in your condition The following signs may mean that your treatment is working well and your condition is improving: Consistently being on time for appointments. Being more organized at home and work. Other people noticing improvements in your behavior. Achieving goals that you set for yourself. Thinking more clearly. The following signs may mean that your treatment is not working very well: Feeling impatience or more confusion. Missing, forgetting, or being late for appointments. An increasing sense of disorganization and messiness. More difficulty in reaching goals that you set for yourself. Loved ones becoming angry or frustrated with you. Follow these instructions at home: Take over-the-counter and prescription medicines only as told by your health care provider. Check with your health care provider before taking any new medicines. Create structure and an organized atmosphere at home. For example: Make a list of tasks, then rank them from most important to least important. Work on one task at a time until your listed tasks are done. Make a daily schedule and follow it consistently every day. Use an appointment calendar, and check it 2 or 3 times a day to keep on track. Keep it with you when you leave the house. Create spaces where you keep certain things, and always put things back in their places after you use them. Keep all follow-up visits as told by your health care provider. This is important. Where to find support Talking to others    Keep emotion out of important discussions and speak in a calm, logical way. Listen closely and patiently to your loved ones. Try to understand their point of view, and try to  avoid getting defensive. Take responsibility for the consequences of your actions. Ask that others do not take your behaviors personally. Aim to solve problems as they come up, and express your feelings instead of bottling them up. Talk openly about what you need from your loved ones and how they can support you. Consider going to family therapy sessions or having your family meet with a specialist who deals with ADHD-related behavior problems. Finances Not all insurance plans cover mental health care, so it is important to check with your insurance carrier. If paying for co-pays or counseling services is a problem, search for a local or county mental health care center. Public mental health care services may be offered there at a low cost or no cost when you are not able to see a private health care provider. If you are taking medicine for ADHD, you may be able to get the generic form, which may be less expensive than brand-name medicine. Some makers of prescription medicines also offer help to patients who cannot afford the medicines that they need. Questions to ask your health care provider: What are the risks and benefits of taking medicines? Would I benefit from therapy? How often should I follow up with a health care provider? Contact a health care provider if: You have side effects from your medicines, such as: Repeated muscle twitches, coughing, or speech outbursts. Sleep problems. Loss of appetite. Depression. New or worsening behavior problems. Dizziness. Unusually fast heartbeat. Stomach pains. Headaches. Get help right away if: You have a severe reaction to a medicine. Your behavior suddenly gets worse. Summary With treatment and support, you can live with ADHD and manage your symptoms. The medicines that are most often prescribed for ADHD are stimulants. Consider taking part in family therapy or self-help groups with family members or friends. When you talk with friends  and family about your ADHD, be patient and communicate openly. Take over-the-counter and prescription medicines only as told by your health care provider. Check with your health care provider before taking any new medicines. This information is not intended to replace advice given to you by your health care provider. Make sure you discuss any questions you have with your health care provider. Document Revised: 10/12/2019 Document Reviewed: 11/14/2019 Elsevier Patient Education  2023 [employer]vier Inc.  

## 2021-11-23 ENCOUNTER — Encounter: Payer: Self-pay | Admitting: Nurse Practitioner

## 2021-11-23 ENCOUNTER — Telehealth: Payer: Self-pay

## 2021-11-23 DIAGNOSIS — F419 Anxiety disorder, unspecified: Secondary | ICD-10-CM

## 2021-11-23 DIAGNOSIS — F902 Attention-deficit hyperactivity disorder, combined type: Secondary | ICD-10-CM

## 2021-11-23 NOTE — Telephone Encounter (Signed)
Transition Care Management Unsuccessful Follow-up Telephone Call  Date of discharge and from where:  11/21/2021 from [facility]  Attempts:  1st Attempt  Reason for unsuccessful TCM follow-up call:  Missing or invalid number

## 2021-11-24 NOTE — Telephone Encounter (Signed)
Transition Care Management Unsuccessful Follow-up Telephone Call  Date of discharge and from where:  11/21/2021 from [facility]  Attempts:  2nd Attempt  Reason for unsuccessful TCM follow-up call:  Missing or invalid number

## 2021-11-25 ENCOUNTER — Ambulatory Visit: Payer: Medicaid Other | Admitting: Physician Assistant

## 2021-11-25 NOTE — Telephone Encounter (Signed)
Transition Care Management Unsuccessful Follow-up Telephone Call  Date of discharge and from where:  11/21/2021 from [facility]  Attempts:  3rd Attempt  Reason for unsuccessful TCM follow-up call:  Unable to reach patient

## 2021-11-27 ENCOUNTER — Ambulatory Visit (INDEPENDENT_AMBULATORY_CARE_PROVIDER_SITE_OTHER): Payer: Medicaid Other | Admitting: Nurse Practitioner

## 2021-11-27 ENCOUNTER — Encounter: Payer: Self-pay | Admitting: Nurse Practitioner

## 2021-11-27 VITALS — BP 120/85 | HR 92 | Temp 98.8°F | Ht 64.02 in | Wt 85.6 lb

## 2021-11-27 DIAGNOSIS — F32A Depression, unspecified: Secondary | ICD-10-CM | POA: Diagnosis not present

## 2021-11-27 DIAGNOSIS — E440 Moderate protein-calorie malnutrition: Secondary | ICD-10-CM | POA: Diagnosis not present

## 2021-11-27 DIAGNOSIS — F419 Anxiety disorder, unspecified: Secondary | ICD-10-CM | POA: Diagnosis not present

## 2021-11-27 DIAGNOSIS — F902 Attention-deficit hyperactivity disorder, combined type: Secondary | ICD-10-CM | POA: Diagnosis not present

## 2021-11-27 MED ORDER — MIRTAZAPINE 7.5 MG PO TABS: 7.5000 mg | ORAL_TABLET | Freq: Every day | ORAL | 4 refills | Status: DC

## 2021-11-27 NOTE — Assessment & Plan Note (Signed)
Chronic, ongoing, followed by neurology.  She is currently off Klonopin and reports feeling much better, continues on Vyvanse with improvement.  Would benefit psychiatry referral in future, she refuses this.

## 2021-11-27 NOTE — Assessment & Plan Note (Signed)
Chronic, ongoing and improved with Vyvanse.  She has reduced off Klonopin prescribed by neurology and reports overall feeling better.  Controlled substance contract and UDS up to date.  Would benefit from psychiatry referral in future, refuses this.

## 2021-11-27 NOTE — Assessment & Plan Note (Signed)
Ongoing, underweight, has ongoing loss.  Continue supplements at home and monitor weight closely.  Denies any purging/binging or restrictions.  Start Mirtazapine 7.5 MG every night, educated her on this and benefit to appetite.  Labs today -- obtain Celiac testing next labs if ongoing loss and consider GI referral.  Obtain thyroid ultrasound to assess for nodules.

## 2021-11-27 NOTE — Progress Notes (Signed)
BP 120/85   Pulse 92   Temp 98.8 F (37.1 C) (Oral)   Ht 5' 4.02" (1.626 m)   Wt 85 lb 9.6 oz (38.8 kg)   LMP 06/11/2021 (Approximate)   SpO2 99%   BMI 14.69 kg/m    Subjective:    Patient ID: Megan Bentley, female    DOB: 1985-10-18, 36 y.o.   MRN: [id number]  HPI: Megan Bentley is a 36 y.o. female  Chief Complaint  Patient presents with   Hospitalization Follow-up    Patient states she is experiencing numbness in hands and feet    Anxiety   ER FOLLOW UP Seen in ER on 11/21/21 for numbness in hands and feet, she had cold [place] quit Risperdal.  CT head was reassuring.  Labs did note K+ 3.2.  She is disappointed in her weight, would like to gain weight.  Has been drinking boost at home.  Is not taking B12 and iron at this time.  She is also followed by neurology for multiple concerns noted above. Her father has hereditary spastic paraplegia = has referral to same provider at [place] her father sees to assess this but has not scheduled with him yet. Time since discharge: 11/21/21 Hospital/facility: [facility] Diagnosis: numbness Procedures/tests: CT and labs Consultants: none New medications: none Discharge instructions:  follow-up with PCP and neurology Status: stable   DEPRESSION/ADHD/ANXIETY She is followed by neurology.  MRI done in 08/01/21 and was normal. Suspected to have seizures and neurology has placed her on Lamictal 25 MG BID + started her on Gabapentin (she has stopped this).   Taking Vyvanse for ADHD (filled last 11/19/21), which has offered her benefit and she has been able to come off Klonopin and she requested neurology no longer fill this for her.  Last fill was, 10/26/21, but she has not been taking.  She continues in medication lock in program through [place] where it is stating she can only have two providers prescribing controlled substances -- PCP and neurology. Lock in for two years she reports.  Mood status: controlled Satisfied with current treatment?:  yes Symptom severity: mild  Duration of current treatment : chronic Side effects: no Medication compliance: good compliance Psychotherapy/counseling: none Previous psychiatric medications: multiple medications Depressed mood: no Anxious mood: no Anhedonia: no Significant weight loss or gain: no Insomnia: none Fatigue: no Feelings of worthlessness or guilt: no Impaired concentration/indecisiveness: no Suicidal ideations: no Hopelessness: no Crying spells: yes    11/27/2021   10:39 AM 08/27/2021   10:55 AM 08/04/2021   10:36 AM 07/06/2021    9:59 AM 05/11/2021    9:17 AM  Depression screen PHQ 2/9  Decreased Interest 1 1 1  0 1  Down, Depressed, Hopeless 0 0 0 0 1  PHQ - 2 Score 1 1 1  0 2  Altered sleeping 0 0 3 0 0  Tired, decreased energy 3 1 3 3 1   Change in appetite 2 0 3 1 1   Feeling bad or failure about yourself  1 1 2  0 0  Trouble concentrating 1 1 3  0 0  Moving slowly or fidgety/restless 0 0 0 0 0  Suicidal thoughts 0 0 0 0 0  PHQ-9 Score 8 4 15 4 4   Difficult doing work/chores Somewhat difficult   Not difficult at all Somewhat difficult       11/27/2021   10:39 AM 08/27/2021   10:55 AM 08/04/2021   10:36 AM 07/06/2021    9:59 AM  GAD 7 :  Generalized Anxiety Score  Nervous, Anxious, on Edge 3 2 2 2   Control/stop worrying 2 1 3 3   Worry too much - different things 1 1 3 3   Trouble relaxing 3 1 3 2   Restless 3 0 1 3  Easily annoyed or irritable 1 1 2 1   Afraid - awful might happen 3 3 3 3   Total GAD 7 Score 16 9 17 17   Anxiety Difficulty Somewhat difficult Extremely difficult Extremely difficult Somewhat difficult    Relevant past medical, surgical, family and social history reviewed and updated as indicated. Interim medical history since our last visit reviewed. Allergies and medications reviewed and updated.  Review of Systems  Constitutional:  Negative for activity change, appetite change, diaphoresis, fatigue and fever.  Respiratory:  Negative for cough,  chest tightness and shortness of breath.   Cardiovascular:  Negative for chest pain, palpitations and leg swelling.  Gastrointestinal: Negative.   Neurological:  Positive for numbness. Negative for dizziness, syncope, weakness and headaches.  Psychiatric/Behavioral:  Negative for decreased concentration, self-injury, sleep disturbance and suicidal ideas. The patient is not nervous/anxious.     Per HPI unless specifically indicated above     Objective:    BP 120/85   Pulse 92   Temp 98.8 F (37.1 C) (Oral)   Ht 5' 4.02" (1.626 m)   Wt 85 lb 9.6 oz (38.8 kg)   LMP 06/11/2021 (Approximate)   SpO2 99%   BMI 14.69 kg/m   Wt Readings from Last 3 Encounters:  11/27/21 85 lb 9.6 oz (38.8 kg)  11/21/21 98 lb (44.5 kg)  08/27/21 95 lb 6.4 oz (43.3 kg)    Physical Exam Vitals and nursing note reviewed.  Constitutional:      General: She is awake. She is not in acute distress.    Appearance: She is well-developed and well-groomed. She is not ill-appearing or toxic-appearing.  HENT:     Head: Normocephalic.     Right Ear: Hearing, tympanic membrane, ear canal and external ear normal.     Left Ear: Hearing, tympanic membrane, ear canal and external ear normal.  Eyes:     General: Lids are normal.        Right eye: No discharge.        Left eye: No discharge.     Extraocular Movements: Extraocular movements intact.     Conjunctiva/sclera: Conjunctivae normal.     Pupils: Pupils are equal, round, and reactive to light.     Visual Fields: Right eye visual fields normal and left eye visual fields normal.  Neck:     Thyroid: No thyromegaly.     Vascular: No carotid bruit or JVD.  Cardiovascular:     Rate and Rhythm: Normal rate and regular rhythm.     Heart sounds: Normal heart sounds. No murmur heard.    No gallop.  Pulmonary:     Effort: Pulmonary effort is normal.     Breath sounds: Normal breath sounds.  Abdominal:     General: Bowel sounds are normal.     Palpations: Abdomen  is soft.  Musculoskeletal:     Cervical back: Normal range of motion and neck supple.     Right lower leg: No edema.     Left lower leg: No edema.  Lymphadenopathy:     Cervical: No cervical adenopathy.  Skin:    General: Skin is warm and dry.  Neurological:     Mental Status: She is alert and oriented to person, place, and  time.     Cranial Nerves: Cranial nerves 2-12 are intact.     Sensory: Sensation is intact.     Motor: Motor function is intact.     Coordination: Coordination is intact.     Gait: Gait is intact.     Deep Tendon Reflexes: Reflexes are normal and symmetric.     Reflex Scores:      Brachioradialis reflexes are 2+ on the right side and 2+ on the left side.      Patellar reflexes are 2+ on the right side and 2+ on the left side. Psychiatric:        Attention and Perception: Attention normal.        Mood and Affect: Mood normal. Affect is tearful.        Speech: Speech normal.        Behavior: Behavior is cooperative.        Thought Content: Thought content normal.    Results for orders placed or performed during the hospital encounter of [id number]  Basic metabolic panel  Result Value Ref Range   Sodium 137 135 - 145 mmol/L   Potassium 3.2 (L) 3.5 - 5.1 mmol/L   Chloride 102 98 - 111 mmol/L   CO2 25 22 - 32 mmol/L   Glucose, Bld 132 (H) 70 - 99 mg/dL   BUN 17 6 - 20 mg/dL   Creatinine, Ser 0.75 0.44 - 1.00 mg/dL   Calcium 9.3 8.9 - 10.3 mg/dL   GFR, Estimated >60 >60 mL/min   Anion gap 10 5 - 15  CBC  Result Value Ref Range   WBC 8.8 4.0 - 10.5 K/uL   RBC 4.15 3.87 - 5.11 MIL/uL   Hemoglobin 12.5 12.0 - 15.0 g/dL   HCT 38.1 36.0 - 46.0 %   MCV 91.8 80.0 - 100.0 fL   MCH 30.1 26.0 - 34.0 pg   MCHC 32.8 30.0 - 36.0 g/dL   RDW 14.2 11.5 - 15.5 %   Platelets 265 150 - 400 K/uL   nRBC 0.0 0.0 - 0.2 %  Urinalysis, Routine w reflex microscopic Urine, Clean Catch  Result Value Ref Range   Color, Urine YELLOW (A) YELLOW   APPearance CLOUDY (A) CLEAR    Specific Gravity, Urine 1.024 1.005 - 1.030   pH 5.0 5.0 - 8.0   Glucose, UA NEGATIVE NEGATIVE mg/dL   Hgb urine dipstick NEGATIVE NEGATIVE   Bilirubin Urine NEGATIVE NEGATIVE   Ketones, ur 20 (A) NEGATIVE mg/dL   Protein, ur NEGATIVE NEGATIVE mg/dL   Nitrite NEGATIVE NEGATIVE   Leukocytes,Ua TRACE (A) NEGATIVE   RBC / HPF 21-50 0 - 5 RBC/hpf   WBC, UA 21-50 0 - 5 WBC/hpf   Bacteria, UA RARE (A) NONE SEEN   Squamous Epithelial / LPF 6-10 0 - 5   Mucus PRESENT   Beta-hydroxybutyric acid  Result Value Ref Range   Beta-Hydroxybutyric Acid 0.10 0.05 - 0.27 mmol/L  Troponin I (High Sensitivity)  Result Value Ref Range   Troponin I (High Sensitivity) 2 <18 ng/L      Assessment & Plan:   Problem List Items Addressed This Visit       Other   ADHD    Chronic, ongoing and improved with Vyvanse.  She has reduced off Klonopin prescribed by neurology and reports overall feeling better.  Controlled substance contract and UDS up to date.  Would benefit from psychiatry referral in future, refuses this.      Anxiety and depression  Chronic, ongoing, followed by neurology.  She is currently off Klonopin and reports feeling much better, continues on Vyvanse with improvement.  Would benefit psychiatry referral in future, she refuses this.      Relevant Medications   mirtazapine (REMERON) 7.5 MG tablet   Protein-calorie malnutrition, moderate (HCC) - Primary    Ongoing, underweight, has ongoing loss.  Continue supplements at home and monitor weight closely.  Denies any purging/binging or restrictions.  Start Mirtazapine 7.5 MG every night, educated her on this and benefit to appetite.  Labs today -- obtain Celiac testing next labs if ongoing loss and consider GI referral.  Obtain thyroid ultrasound to assess for nodules.      Relevant Orders   CBC with Differential/Platelet   Comprehensive metabolic panel   TSH   Vitamin B12   Iron Binding Cap (TIBC)([facility])   Folate    Ferritin   US THYROID     Follow up plan: Return in about 4 weeks (around 12/25/2021) for Weight Check -- added Mirtazapine.

## 2021-11-28 LAB — CBC WITH DIFFERENTIAL/PLATELET
Basophils Absolute: 0 10*3/uL (ref 0.0–0.2)
Basos: 0 %
EOS (ABSOLUTE): 0.1 10*3/uL (ref 0.0–0.4)
Eos: 1 %
Hematocrit: 38.6 % (ref 34.0–46.6)
Hemoglobin: 12.9 g/dL (ref 11.1–15.9)
Immature Grans (Abs): 0 10*3/uL (ref 0.0–0.1)
Immature Granulocytes: 0 %
Lymphocytes Absolute: 1.5 10*3/uL (ref 0.7–3.1)
Lymphs: 21 %
MCH: 30.6 pg (ref 26.6–33.0)
MCHC: 33.4 g/dL (ref 31.5–35.7)
MCV: 92 fL (ref 79–97)
Monocytes Absolute: 0.5 10*3/uL (ref 0.1–0.9)
Monocytes: 7 %
Neutrophils Absolute: 5.1 10*3/uL (ref 1.4–7.0)
Neutrophils: 71 %
Platelets: 320 10*3/uL (ref 150–450)
RBC: 4.22 x10E6/uL (ref 3.77–5.28)
RDW: 13.9 % (ref 11.7–15.4)
WBC: 7.1 10*3/uL (ref 3.4–10.8)

## 2021-11-28 LAB — COMPREHENSIVE METABOLIC PANEL
ALT: 11 IU/L (ref 0–32)
AST: 15 IU/L (ref 0–40)
Albumin/Globulin Ratio: 2.3 — ABNORMAL HIGH (ref 1.2–2.2)
Albumin: 4.8 g/dL (ref 3.8–4.8)
Alkaline Phosphatase: 58 IU/L (ref 44–121)
BUN/Creatinine Ratio: 14 (ref 9–23)
BUN: 10 mg/dL (ref 6–20)
Bilirubin Total: 0.6 mg/dL (ref 0.0–1.2)
CO2: 24 mmol/L (ref 20–29)
Calcium: 9.8 mg/dL (ref 8.7–10.2)
Chloride: 102 mmol/L (ref 96–106)
Creatinine, Ser: 0.71 mg/dL (ref 0.57–1.00)
Globulin, Total: 2.1 g/dL (ref 1.5–4.5)
Glucose: 104 mg/dL — ABNORMAL HIGH (ref 70–99)
Potassium: 4.5 mmol/L (ref 3.5–5.2)
Sodium: 140 mmol/L (ref 134–144)
Total Protein: 6.9 g/dL (ref 6.0–8.5)
eGFR: 115 mL/min/{1.73_m2} (ref >59)

## 2021-11-28 LAB — IRON AND TIBC
Iron Saturation: 31 % (ref 15–55)
Iron: 94 ug/dL (ref 27–159)
Total Iron Binding Capacity: 305 ug/dL (ref 250–450)
UIBC: 211 ug/dL (ref 131–425)

## 2021-11-28 LAB — FOLATE: Folate: 17.8 ng/mL (ref >3.0)

## 2021-11-28 LAB — FERRITIN: Ferritin: 30 ng/mL (ref 15–150)

## 2021-11-28 LAB — TSH: TSH: 0.485 u[IU]/mL (ref 0.450–4.500)

## 2021-11-28 LAB — VITAMIN B12: Vitamin B-12: 356 pg/mL (ref 232–1245)

## 2021-11-28 NOTE — Progress Notes (Signed)
Contacted via [place] [facility] [place], your labs have returned: - Glucose is only mildly elevated, do not suspect any diabetes present based on this. -- Kidney function, creatinine and eGFR, remains normal, as is liver function, AST and ALT.   - Thyroid level is in normal range. - CBC is stable, as is iron level + ferritin and folate.   - Your B12 level remains on lower side of normal.  Are you taking Vitamin B12 at home daily.  I recommend you take 1000 MCG daily, as this can help with numbness and nerve discomfort.  Any questions? Keep being amazing!!  Thank you for allowing me to participate in your care.  I appreciate you. Kindest regards, Dyane Broberg

## 2021-12-04 ENCOUNTER — Encounter: Payer: Self-pay | Admitting: Physician Assistant

## 2021-12-07 ENCOUNTER — Ambulatory Visit
Admission: RE | Admit: 2021-12-07 | Discharge: 2021-12-07 | Disposition: A | Discharge status: Home / Self Care | Payer: Medicaid Other | Source: Ambulatory Visit | Attending: Nurse Practitioner | Admitting: Nurse Practitioner

## 2021-12-07 DIAGNOSIS — E010 Iodine-deficiency related diffuse (endemic) goiter: Secondary | ICD-10-CM | POA: Diagnosis not present

## 2021-12-07 DIAGNOSIS — E440 Moderate protein-calorie malnutrition: Secondary | ICD-10-CM | POA: Insufficient documentation

## 2021-12-12 DIAGNOSIS — Z419 Encounter for procedure for purposes other than remedying health state, unspecified: Secondary | ICD-10-CM | POA: Diagnosis not present

## 2021-12-14 ENCOUNTER — Encounter: Payer: Self-pay | Admitting: Nurse Practitioner

## 2021-12-14 MED ORDER — LISDEXAMFETAMINE DIMESYLATE 30 MG PO CAPS
30.0000 mg | ORAL_CAPSULE | Freq: Every day | ORAL | 0 refills | Status: DC
Start: 2021-12-18 — End: 2022-01-21

## 2022-01-01 ENCOUNTER — Encounter: Payer: Self-pay | Admitting: Nurse Practitioner

## 2022-01-04 ENCOUNTER — Encounter: Payer: Self-pay | Admitting: Nurse Practitioner

## 2022-01-04 NOTE — Telephone Encounter (Signed)
Called patient's number on file and it states that it is not in service

## 2022-01-05 ENCOUNTER — Ambulatory Visit (INDEPENDENT_AMBULATORY_CARE_PROVIDER_SITE_OTHER): Payer: Medicaid Other | Admitting: Nurse Practitioner

## 2022-01-05 ENCOUNTER — Encounter: Payer: Self-pay | Admitting: Nurse Practitioner

## 2022-01-05 VITALS — BP 112/74 | HR 68 | Temp 98.9°F | Wt 90.0 lb

## 2022-01-05 DIAGNOSIS — R61 Generalized hyperhidrosis: Secondary | ICD-10-CM

## 2022-01-05 DIAGNOSIS — R300 Dysuria: Secondary | ICD-10-CM | POA: Diagnosis not present

## 2022-01-05 DIAGNOSIS — R8290 Unspecified abnormal findings in urine: Secondary | ICD-10-CM

## 2022-01-05 LAB — URINALYSIS, ROUTINE W REFLEX MICROSCOPIC
Bilirubin, UA: NEGATIVE
Glucose, UA: NEGATIVE
Ketones, UA: NEGATIVE
Nitrite, UA: NEGATIVE
Protein,UA: NEGATIVE
RBC, UA: NEGATIVE
Specific Gravity, UA: >1.03 — ABNORMAL HIGH (ref 1.005–1.030)
Urobilinogen, Ur: 0.2 mg/dL (ref 0.2–1.0)
pH, UA: 6 (ref 5.0–7.5)

## 2022-01-05 LAB — MICROSCOPIC EXAMINATION: RBC, Urine: NONE SEEN /hpf (ref 0–2)

## 2022-01-05 MED ORDER — NITROFURANTOIN MONOHYD MACRO 100 MG PO CAPS
100.0000 mg | ORAL_CAPSULE | Freq: Two times a day (BID) | ORAL | 0 refills | Status: DC
Start: 2022-01-05 — End: 2022-02-14

## 2022-01-05 NOTE — Addendum Note (Signed)
Addended by: Larae Grooms on: 01/05/2022 02:24 PM   Modules accepted: Orders

## 2022-01-05 NOTE — Addendum Note (Signed)
Addended by: Larae Grooms on: 01/05/2022 02:23 PM   Modules accepted: Orders

## 2022-01-05 NOTE — Progress Notes (Signed)
Please let patient know that her urine did show some bacteria.  I have sent in a prescription for Macrobid twice daily for 5 days.

## 2022-01-05 NOTE — Progress Notes (Signed)
BP 112/74   Pulse 68   Temp 98.9 F (37.2 C) (Oral)   Wt 90 lb (40.8 kg)   LMP 06/11/2021 (Approximate)   SpO2 98%   BMI 15.44 kg/m    Subjective:    Patient ID: Megan Bentley, female    DOB: 1985-08-30, 36 y.o.   MRN: [id number]  HPI: Megan Bentley is a 36 y.o. female  Chief Complaint  Patient presents with   urinary symptoms    Fever at home last week of  Low back pain, especially when urinating.  Nausea x 2 days  Some dysuria    URINARY SYMPTOMS Dysuria: yes Urinary frequency: yes Urgency: yes Small volume voids: yes Symptom severity: no Urinary incontinence: no Foul odor: no Hematuria: no Abdominal pain: yes Back pain: yes Suprapubic pain/pressure: no Flank pain: yes x 3 days Fever:  subjective Vomiting: no Relief with cranberry juice: yes Relief with pyridium: yes Status: worse Previous urinary tract infection: no Recurrent urinary tract infection: no Nausea  Patient states she would like to have her thyroid levels checked. States she has been having night sweats, chills, and fatigue.  She had a thyroid scan which show mild heterogeneity nodule.    Relevant past medical, surgical, family and social history reviewed and updated as indicated. Interim medical history since our last visit reviewed. Allergies and medications reviewed and updated.  Review of Systems  Constitutional:  Positive for fatigue. Negative for fever.  Gastrointestinal:  Positive for nausea. Negative for abdominal pain and vomiting.  Endocrine:       Night sweats  Genitourinary:  Positive for decreased urine volume, dysuria, flank pain, frequency and urgency. Negative for hematuria.  Musculoskeletal:  Positive for back pain.    Per HPI unless specifically indicated above     Objective:    BP 112/74   Pulse 68   Temp 98.9 F (37.2 C) (Oral)   Wt 90 lb (40.8 kg)   LMP 06/11/2021 (Approximate)   SpO2 98%   BMI 15.44 kg/m   Wt Readings from Last 3 Encounters:   01/05/22 90 lb (40.8 kg)  11/27/21 85 lb 9.6 oz (38.8 kg)  11/21/21 98 lb (44.5 kg)    Physical Exam Vitals and nursing note reviewed.  Constitutional:      General: She is not in acute distress.    Appearance: Normal appearance. She is normal weight. She is not ill-appearing, toxic-appearing or diaphoretic.  HENT:     Head: Normocephalic.     Right Ear: External ear normal.     Left Ear: External ear normal.     Nose: Nose normal.     Mouth/Throat:     Mouth: Mucous membranes are moist.     Pharynx: Oropharynx is clear.  Eyes:     General:        Right eye: No discharge.        Left eye: No discharge.     Extraocular Movements: Extraocular movements intact.     Conjunctiva/sclera: Conjunctivae normal.     Pupils: Pupils are equal, round, and reactive to light.  Cardiovascular:     Rate and Rhythm: Normal rate and regular rhythm.     Heart sounds: No murmur heard. Pulmonary:     Effort: Pulmonary effort is normal. No respiratory distress.     Breath sounds: Normal breath sounds. No wheezing or rales.  Abdominal:     General: Abdomen is flat. Bowel sounds are normal. There is no distension.  Palpations: Abdomen is soft.     Tenderness: There is no abdominal tenderness. There is no right CVA tenderness, left CVA tenderness or guarding.  Musculoskeletal:     Cervical back: Normal range of motion and neck supple.  Skin:    General: Skin is warm and dry.     Capillary Refill: Capillary refill takes less than 2 seconds.  Neurological:     General: No focal deficit present.     Mental Status: She is alert and oriented to person, place, and time. Mental status is at baseline.  Psychiatric:        Mood and Affect: Mood normal.        Behavior: Behavior normal.        Thought Content: Thought content normal.        Judgment: Judgment normal.     Results for orders placed or performed in visit on 11/27/21  CBC with Differential/Platelet  Result Value Ref Range   WBC 7.1  3.4 - 10.8 x10E3/uL   RBC 4.22 3.77 - 5.28 x10E6/uL   Hemoglobin 12.9 11.1 - 15.9 g/dL   Hematocrit 38.6 34.0 - 46.6 %   MCV 92 79 - 97 fL   MCH 30.6 26.6 - 33.0 pg   MCHC 33.4 31.5 - 35.7 g/dL   RDW 13.9 11.7 - 15.4 %   Platelets 320 150 - 450 x10E3/uL   Neutrophils 71 Not Estab. %   Lymphs 21 Not Estab. %   Monocytes 7 Not Estab. %   Eos 1 Not Estab. %   Basos 0 Not Estab. %   Neutrophils Absolute 5.1 1.4 - 7.0 x10E3/uL   Lymphocytes Absolute 1.5 0.7 - 3.1 x10E3/uL   Monocytes Absolute 0.5 0.1 - 0.9 x10E3/uL   EOS (ABSOLUTE) 0.1 0.0 - 0.4 x10E3/uL   Basophils Absolute 0.0 0.0 - 0.2 x10E3/uL   Immature Granulocytes 0 Not Estab. %   Immature Grans (Abs) 0.0 0.0 - 0.1 x10E3/uL  Comprehensive metabolic panel  Result Value Ref Range   Glucose 104 (H) 70 - 99 mg/dL   BUN 10 6 - 20 mg/dL   Creatinine, Ser 0.71 0.57 - 1.00 mg/dL   eGFR 115 >59 mL/min/1.73   BUN/Creatinine Ratio 14 9 - 23   Sodium 140 134 - 144 mmol/L   Potassium 4.5 3.5 - 5.2 mmol/L   Chloride 102 96 - 106 mmol/L   CO2 24 20 - 29 mmol/L   Calcium 9.8 8.7 - 10.2 mg/dL   Total Protein 6.9 6.0 - 8.5 g/dL   Albumin 4.8 3.8 - 4.8 g/dL   Globulin, Total 2.1 1.5 - 4.5 g/dL   Albumin/Globulin Ratio 2.3 (H) 1.2 - 2.2   Bilirubin Total 0.6 0.0 - 1.2 mg/dL   Alkaline Phosphatase 58 44 - 121 IU/L   AST 15 0 - 40 IU/L   ALT 11 0 - 32 IU/L  TSH  Result Value Ref Range   TSH 0.485 0.450 - 4.500 uIU/mL  Vitamin B12  Result Value Ref Range   Vitamin B-12 356 232 - 1,245 pg/mL  Iron Binding Cap (TIBC)([facility])  Result Value Ref Range   Total Iron Binding Capacity 305 250 - 450 ug/dL   UIBC 211 131 - 425 ug/dL   Iron 94 27 - 159 ug/dL   Iron Saturation 31 15 - 55 %  Folate  Result Value Ref Range   Folate 17.8 >3.0 ng/mL  Ferritin  Result Value Ref Range   Ferritin 30 15 - 150 ng/mL  Assessment & Plan:   Problem List Items Addressed This Visit   None Visit Diagnoses     Dysuria    -  Primary    Not able to leave urine sample during visit.  Will bring back.  Once results are back from [facility] will make further recommendations regarding treatment.   Relevant Orders   Urinalysis, Routine w reflex microscopic   Night sweats       Will check labs at visit today. Will make recommendations based on lab results.    Relevant Orders   TSH   T4, free        Follow up plan: Return if symptoms worsen or fail to improve.

## 2022-01-06 DIAGNOSIS — R8290 Unspecified abnormal findings in urine: Secondary | ICD-10-CM | POA: Diagnosis not present

## 2022-01-06 LAB — T4, FREE: Free T4: 1.23 ng/dL (ref 0.82–1.77)

## 2022-01-06 LAB — TSH: TSH: 0.978 u[IU]/mL (ref 0.450–4.500)

## 2022-01-06 NOTE — Progress Notes (Signed)
HI Megan Bentley.  Your thyroid labs are normal.  No concerns at this time.  Follow up with [name] as you were previously scheduled.

## 2022-01-07 DIAGNOSIS — F4310 Post-traumatic stress disorder, unspecified: Secondary | ICD-10-CM | POA: Diagnosis not present

## 2022-01-07 DIAGNOSIS — R6889 Other general symptoms and signs: Secondary | ICD-10-CM | POA: Diagnosis not present

## 2022-01-07 DIAGNOSIS — R42 Dizziness and giddiness: Secondary | ICD-10-CM | POA: Diagnosis not present

## 2022-01-07 DIAGNOSIS — F419 Anxiety disorder, unspecified: Secondary | ICD-10-CM | POA: Diagnosis not present

## 2022-01-07 DIAGNOSIS — F410 Panic disorder [episodic paroxysmal anxiety] without agoraphobia: Secondary | ICD-10-CM | POA: Diagnosis not present

## 2022-01-07 DIAGNOSIS — R519 Headache, unspecified: Secondary | ICD-10-CM | POA: Diagnosis not present

## 2022-01-07 DIAGNOSIS — R569 Unspecified convulsions: Secondary | ICD-10-CM | POA: Diagnosis not present

## 2022-01-07 DIAGNOSIS — R55 Syncope and collapse: Secondary | ICD-10-CM | POA: Diagnosis not present

## 2022-01-08 ENCOUNTER — Ambulatory Visit: Payer: Self-pay | Admitting: *Deleted

## 2022-01-08 LAB — URINE CULTURE

## 2022-01-08 NOTE — Progress Notes (Signed)
Hi Chasady.  Your urine culture did not grow any bacteria.  You can stop the antibiotics.  Please let me know if you have any questions.
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
## 2022-01-08 NOTE — Telephone Encounter (Signed)
[name], do you know who the psychiatrist or therapist is?

## 2022-01-12 DIAGNOSIS — Z419 Encounter for procedure for purposes other than remedying health state, unspecified: Secondary | ICD-10-CM | POA: Diagnosis not present

## 2022-01-17 ENCOUNTER — Encounter: Payer: Self-pay | Admitting: Nurse Practitioner

## 2022-01-18 ENCOUNTER — Other Ambulatory Visit: Payer: Self-pay

## 2022-01-19 ENCOUNTER — Encounter: Payer: Self-pay | Admitting: Gastroenterology

## 2022-01-19 ENCOUNTER — Ambulatory Visit (INDEPENDENT_AMBULATORY_CARE_PROVIDER_SITE_OTHER): Payer: Medicaid Other | Admitting: Gastroenterology

## 2022-01-19 ENCOUNTER — Other Ambulatory Visit: Payer: Self-pay

## 2022-01-19 ENCOUNTER — Ambulatory Visit: Payer: Medicaid Other | Admitting: Gastroenterology

## 2022-01-19 VITALS — BP 117/83 | HR 89 | Temp 98.9°F | Ht 64.02 in | Wt 96.0 lb

## 2022-01-19 DIAGNOSIS — R634 Abnormal weight loss: Secondary | ICD-10-CM | POA: Diagnosis not present

## 2022-01-19 DIAGNOSIS — R140 Abdominal distension (gaseous): Secondary | ICD-10-CM

## 2022-01-19 DIAGNOSIS — R112 Nausea with vomiting, unspecified: Secondary | ICD-10-CM

## 2022-01-19 MED ORDER — NA SULFATE-K SULFATE-MG SULF 17.5-3.13-1.6 GM/177ML PO SOLN: 354.0000 mL | Freq: Once | ORAL | 0 refills | Status: AC

## 2022-01-19 NOTE — Progress Notes (Unsigned)
Arlyss Repress, MD [address]  [address]  [place], [place] [address]  Main: [phone]  Fax: [phone]    Gastroenterology Consultation  Referring Provider:     Marjie Skiff, NP Primary Care Physician:  Marjie Skiff, NP Primary Gastroenterologist:  Dr. Arlyss Repress Reason for Consultation: Nausea and vomiting        HPI:   Megan Bentley is a 36 y.o. female referred by Dr. Marjie Skiff, NP  for consultation & management of chronic symptoms of sporadic episodes of nausea and vomiting associated with abdominal bloating and weight loss.  Patient has been experiencing the symptoms for last 2 to 3 years, underwent several CT abdomen and pelvis scans, which revealed moderate to large volume stool throughout the colon suggestive of constipation, distended stomach with food debris.  Patient has hard time gaining weight.  Her labs including CBC, CMP, thyroid profile are normal.  She could not figure out any food intolerances.  Past history of tobacco use, does not drink alcohol daily  NSAIDs: None  Antiplts/Anticoagulants/Anti thrombotics: None  GI Procedures: None  Past Medical History:  Diagnosis Date   ADHD (attention deficit hyperactivity disorder)    Allergy    Anemia    Anxiety    Arthritis    Depression    History of kidney stones    Kidney stone    Migraines    Pneumonia    PONV (postoperative nausea and vomiting)    Preeclampsia    Renal disorder    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HCC)    Sepsis (HCC)     Past Surgical History:  Procedure Laterality Date   ANTERIOR AND POSTERIOR REPAIR N/A 06/26/2021   Procedure: ANTERIOR (CYSTOCELE) AND POSTERIOR REPAIR (RECTOCELE);  Surgeon: Schermerhorn, Ihor Austin, MD;  Location: [facility];  Service: Gynecology;  Laterality: N/A;   BILATERAL SALPINGECTOMY Bilateral 06/26/2021   Procedure: BILATERAL SALPINGECTOMY;  Surgeon: Schermerhorn, Ihor Austin, MD;  Location: [facility];  Service: Gynecology;  Laterality:  Bilateral;   CYSTOSCOPY W/ RETROGRADES Bilateral 08/06/2015   Procedure: CYSTOSCOPY WITH RETROGRADE PYELOGRAM;  Surgeon: [name], MD;  Location: [facility];  Service: Urology;  Laterality: Bilateral;   CYSTOSCOPY/URETEROSCOPY/HOLMIUM LASER/STENT PLACEMENT Left 08/06/2015   Procedure: CYSTOSCOPY/URETEROSCOPY/HOLMIUM LASER/STENT PLACEMENT;  Surgeon: [name], MD;  Location: [facility];  Service: Urology;  Laterality: Left;   VAGINAL HYSTERECTOMY N/A 06/26/2021   Procedure: HYSTERECTOMY VAGINAL;  Surgeon: Schermerhorn, Ihor Austin, MD;  Location: [facility];  Service: Gynecology;  Laterality: N/A;   WISDOM TOOTH EXTRACTION       Current Outpatient Medications:    gabapentin (NEURONTIN) 100 MG capsule, Take 100 mg by mouth 2 (two) times daily., Disp: , Rfl:    lamoTRIgine (LAMICTAL) 25 MG tablet, WEEK 1: 1 TABLET AT NIGHT. WEEK 2: 1 TABLET IN THE MORNING AND 1 TABLET AT NIGHT. WEEK 3: 1 TABLET IN THE MORNING AND 2 TABLETS AT NIGHT. WEEK 4: 2 TABLETS IN THE MORNING AND 2 TABLETS AT NIGHT. THEN CONTINUE THIS DOSE., Disp: , Rfl:    clonazePAM (KLONOPIN) 0.5 MG tablet, Take 0.25-0.5 mg by mouth daily as needed. (Patient not taking: Reported on 01/19/2022), Disp: , Rfl:    lamoTRIgine (LAMICTAL) 25 MG tablet, Week 1:  1 tablet at night. Week 2:  1 tablet in the morning and 1 tablet at night. Week 3:  1 tablet in the morning and 2 tablets at night. Week 4:  2 tablets in the morning and 2 tablets at  night. Then continue this dose., Disp: , Rfl:    lisdexamfetamine (VYVANSE) 30 MG capsule, Take 1 capsule (30 mg total) by mouth daily. (Patient not taking: Reported on 01/19/2022), Disp: 30 capsule, Rfl: 0   mirtazapine (REMERON) 7.5 MG tablet, Take 1 tablet (7.5 mg total) by mouth at bedtime. (Patient not taking: Reported on 01/19/2022), Disp: 90 tablet, Rfl: 4   nitrofurantoin, macrocrystal-monohydrate, (MACROBID) 100 MG capsule, Take 1 capsule (100 mg total) by mouth 2 (two) times daily. (Patient not taking:  Reported on 01/19/2022), Disp: 10 capsule, Rfl: 0   Family History  Problem Relation Age of Onset   Multiple sclerosis Other    Sjogren's syndrome Other        Aunt kim   Kidney Stones Other    Anxiety disorder Mother    Multiple sclerosis Mother    Raynaud syndrome Father    Anxiety disorder Father    Diabetes Father    Anxiety disorder Maternal Grandmother    COPD Maternal Grandmother    Heart disease Maternal Grandfather    Multiple sclerosis Sister    Lung disease Paternal Grandmother    Autoimmune disease Paternal Grandmother    Kidney disease Neg Hx    Bladder Cancer Neg Hx      Social History   Tobacco Use   Smoking status: Former    Packs/day: 0.30    Years: 11.00    Total pack years: 3.30    Types: Cigarettes    Quit date: 2021    Years since quitting: 2.6   Smokeless tobacco: Never  Vaping Use   Vaping Use: Some days  Substance Use Topics   Alcohol use: No    Alcohol/week: 0.0 standard drinks of alcohol   Drug use: Not Currently    Types: Marijuana    Comment: last used 2021    Allergies as of 01/19/2022 - Review Complete 01/19/2022  Allergen Reaction Noted   Sulfa antibiotics Anaphylaxis 04/20/2015   Molnupiravir Hives and Itching 04/20/2021   Amoxicillin Itching and Other (See Comments) 08/27/2020   Prednisone Rash 04/18/2021    Review of Systems:    All systems reviewed and negative except where noted in HPI.   Physical Exam:  BP 117/83 (BP Location: Left Arm, Patient Position: Sitting, Cuff Size: Normal)   Pulse 89   Temp 98.9 F (37.2 C) (Oral)   Ht 5' 4.02" (1.626 m)   Wt 96 lb (43.5 kg)   LMP 06/11/2021 (Approximate)   BMI 16.47 kg/m  Patient's last menstrual period was 06/11/2021 (approximate).  General:   Alert, thin built, moderately nourished, pleasant and cooperative in NAD Head:  Normocephalic and atraumatic. Eyes:  Sclera clear, no icterus.   Conjunctiva pink. Ears:  Normal auditory acuity. Nose:  No deformity, discharge,  or lesions. Mouth:  No deformity or lesions,oropharynx pink & moist. Neck:  Supple; no masses or thyromegaly. Lungs:  Respirations even and unlabored.  Clear throughout to auscultation.   No wheezes, crackles, or rhonchi. No acute distress. Heart:  Regular rate and rhythm; no murmurs, clicks, rubs, or gallops. Abdomen:  Normal bowel sounds. Soft, non-tender and non-distended without masses, hepatosplenomegaly or hernias noted.  No guarding or rebound tenderness.   Rectal: Not performed Msk:  Symmetrical without gross deformities. Good, equal movement & strength bilaterally. Pulses:  Normal pulses noted. Extremities:  No clubbing or edema.  No cyanosis. Neurologic:  Alert and oriented x3;  grossly normal neurologically. Skin:  Intact without significant lesions or rashes. No jaundice.  Psych:  Alert and cooperative. Normal mood and affect.  Imaging Studies: Reviewed  Assessment and Plan:   Megan Bentley is a 36 y.o. female with history of anxiety, ADHD seen in consultation for intermittent episodes of nausea, vomiting, constipation, abdominal bloating and unable to gain weight.  Recommend EGD with gastric and duodenal biopsies and colonoscopy with TI evaluation Recommend H. pylori breath test, celiac disease panel   Follow up after the above workup   Arlyss Repress, MD

## 2022-01-21 LAB — H. PYLORI BREATH TEST: H pylori Breath Test: NEGATIVE

## 2022-01-21 MED ORDER — LISDEXAMFETAMINE DIMESYLATE 30 MG PO CAPS: 30.0000 mg | ORAL_CAPSULE | Freq: Every day | ORAL | 0 refills | Status: DC

## 2022-01-22 LAB — CELIAC DISEASE PANEL
Endomysial IgA: NEGATIVE
IgA/Immunoglobulin A, Serum: 65 mg/dL — ABNORMAL LOW (ref 87–352)
Transglutaminase IgA: <2 U/mL (ref 0–3)

## 2022-01-22 LAB — TISSUE TRANSGLUTAMINASE, IGG: Tissue Transglut Ab: <2 U/mL (ref 0–5)

## 2022-01-26 ENCOUNTER — Telehealth: Payer: Self-pay

## 2022-01-26 NOTE — Telephone Encounter (Signed)
Received a fax back from [facility] stating that the referral we sent to them cannot be accepted as they are not in network with Medicaid.   FYI to provider and referral coordinator.

## 2022-02-03 ENCOUNTER — Encounter: Payer: Self-pay | Admitting: Psychology

## 2022-02-03 ENCOUNTER — Encounter: Payer: Self-pay | Admitting: Nurse Practitioner

## 2022-02-09 ENCOUNTER — Encounter: Payer: Self-pay | Admitting: Nurse Practitioner

## 2022-02-12 DIAGNOSIS — Z419 Encounter for procedure for purposes other than remedying health state, unspecified: Secondary | ICD-10-CM | POA: Diagnosis not present

## 2022-02-14 NOTE — Patient Instructions (Signed)
Attention Deficit Hyperactivity Disorder, Adult Attention deficit hyperactivity disorder (ADHD) is a mental health disorder that starts during childhood. For many people with ADHD, the disorder continues into the adult years. Treatment can help you manage your symptoms. There are three main types of ADHD: Inattentive. With this type, adults have difficulty paying attention. This may affect cognitive abilities. Hyperactive-impulsive. With this type, adults have a lot of energy and have difficulty controlling their behavior. Combination type. Some people may have symptoms of both types. What are the causes? The exact cause of ADHD is not known. Most experts believe a person's genes and environment possibly contribute to ADHD. What increases the risk? The following factors may make you more likely to develop this condition: Having a first-degree relative such as a parent, brother, or sister, with the condition. Being born before 37 weeks of pregnancy (prematurely) or at a low birth weight. Being born to a mother who smoked tobacco or drank alcohol during pregnancy. Having experienced a brain injury. Being exposed to lead or other toxins in the womb or early in life. What are the signs or symptoms? Symptoms of this condition depend on the type of ADHD. Symptoms of the inattentive type include: Difficulty paying attention or following instructions. Often making simple mistakes. Being disorganized. Avoiding tasks that require time and attention. Losing and forgetting things. Symptoms of the hyperactive-impulsive type include: Restlessness. Talking out of turn, interrupting others, or talking too much. Difficulty with: Sitting still. Feeling motivated. Relaxing. Waiting in line or waiting for a turn. People with the combination type have symptoms of both of the other types. In adults, this condition may lead to certain problems, such as: Keeping jobs. Performing tasks at work. Having  stable relationships. Being on time or keeping to a schedule. How is this diagnosed? This condition is diagnosed based on your current symptoms and your history of symptoms. The diagnosis can be made by a health care provider such as a primary care provider or a mental health care specialist. Your health care provider may use a symptom checklist or a behavior rating scale to evaluate your symptoms. Your health care provider may also want to talk with people who have observed your behaviors throughout your life. How is this treated? This condition can be treated with medicines and behavior therapy. Medicines may be the best option to reduce impulsive behaviors and improve attention. Your health care provider may recommend: Stimulant medicines. These are the most common medicines used for adult ADHD. They affect certain chemicals in the brain (neurotransmitters) and improve your ability to control your symptoms. A non-stimulant medicine. These medicines can also improve focus, attention, and impulsive behavior. It may take weeks to months to see the effects of this medicine. Counseling and behavioral management are also important for treating ADHD. Counseling is often used along with medicine. Your health care provider may suggest: Cognitive behavioral therapy (CBT). This type of therapy teaches you to replace negative thoughts and actions with positive thoughts and actions. When used as part of ADHD treatment, this therapy may also include: Coping strategies for organization, time management, impulse control, and stress reduction. Mindfulness and meditation training. Behavioral management. You may work with a coach who is specially trained to help people with ADHD manage and organize activities and function more effectively. Follow these instructions at home: Medicines  Take over-the-counter and prescription medicines only as told by your health care provider. Talk with your health care provider  about the possible side effects of your medicines and   how to manage them. Alcohol use Do not drink alcohol if: Your health care provider tells you not to drink. You are pregnant, may be pregnant, or are planning to become pregnant. If you drink alcohol: Limit how much you use to: 0-1 drink a day for women. 0-2 drinks a day for men. Know how much alcohol is in your drink. In the [place] one drink equals one 12 oz bottle of beer (355 mL), one 5 oz glass of wine (148 mL), or one 1 oz glass of hard liquor (44 mL). Lifestyle  Do not use illegal drugs. Get enough sleep. Eat a healthy diet. Exercise regularly. Exercise can help to reduce stress and anxiety. General instructions Learn as much as you can about adult ADHD, and work closely with your health care providers to find the treatments that work best for you. Follow the same schedule each day. Use reminder devices like notes, calendars, and phone apps to stay on time and organized. Keep all follow-up visits. Your health care provider will need to monitor your condition and adjust your treatment over time. Where to find more information A health care provider may be able to recommend resources that are available online or over the phone. You could start with: Attention Deficit Disorder Association (ADDA): add.org National Institute of Mental Health (NIMH): nimh.nih.gov Contact a health care provider if: Your symptoms continue to cause problems. You have side effects from your medicine, such as: Repeated muscle twitches, coughing, or speech outbursts. Sleep problems. Loss of appetite. Dizziness. Unusually fast heartbeat. Stomach pains. Headaches. You are struggling with anxiety, depression, or substance abuse. Get help right away if: You have a severe reaction to a medicine. This symptom may be an emergency. Get help right away. Call 911. Do not wait to see if the symptom will go away. Do not drive yourself to the hospital. Take  one of these steps if you feel like you may hurt yourself or others, or have thoughts about taking your own life: Go to your nearest emergency room. Call 911. Call the National Suicide Prevention Lifeline at 1-800-273-8255 or 988. This is open 24 hours a day Text the Crisis Text Line at 741741. Summary ADHD is a mental health disorder that starts during childhood and often continues into your adult years. The exact cause of ADHD is not known. Most experts believe genetics and environmental factors contribute to ADHD. There is no cure for ADHD, but treatment with medicine, cognitive behavioral therapy, or behavioral management can help you manage your condition. This information is not intended to replace advice given to you by your health care provider. Make sure you discuss any questions you have with your health care provider. Document Revised: 09/18/2021 Document Reviewed: 09/18/2021 Elsevier Patient Education  2023 Elsevier Inc.  

## 2022-02-16 ENCOUNTER — Encounter: Payer: Self-pay | Admitting: Gastroenterology

## 2022-02-17 ENCOUNTER — Encounter: Payer: Self-pay | Admitting: Nurse Practitioner

## 2022-02-17 ENCOUNTER — Ambulatory Visit (INDEPENDENT_AMBULATORY_CARE_PROVIDER_SITE_OTHER): Payer: Medicaid Other | Admitting: Nurse Practitioner

## 2022-02-17 DIAGNOSIS — F902 Attention-deficit hyperactivity disorder, combined type: Secondary | ICD-10-CM

## 2022-02-17 MED ORDER — LISDEXAMFETAMINE DIMESYLATE 40 MG PO CAPS: 40.0000 mg | ORAL_CAPSULE | ORAL | 0 refills | Status: DC

## 2022-02-17 NOTE — Assessment & Plan Note (Signed)
Chronic, ongoing and improved with Vyvanse, but is noticing some decrease in focus.  She is reducing off Klonopin prescribed by neurology and reports overall feeling better without this.  Will increase Vyvanse to 40 MG daily, discussed with her, and monitor concentration.  Controlled substance contract and UDS up to date.

## 2022-02-17 NOTE — Addendum Note (Signed)
Addended by: Aura Dials T on: 02/17/2022 04:20 PM   Modules accepted: Orders

## 2022-02-17 NOTE — Progress Notes (Addendum)
BP 125/83   Pulse 82   Temp 98.1 F (36.7 C) (Oral)   Ht 5' 4.02" (1.626 m)   Wt 95 lb 1.6 oz (43.1 kg)   LMP 06/11/2021 (Approximate)   SpO2 90%   BMI 16.31 kg/m    Subjective:    Patient ID: Megan Bentley, female    DOB: 07-20-1985, 36 y.o.   MRN: [id number]  HPI: JAMES LAFALCE is a 36 y.o. female  Chief Complaint  Patient presents with   ADHD    Patient is here to discuss ADHD medication. Patient says her medication was effective when she first start taking the medication and she is now noticing a difference of not being as effective.    ADHD FOLLOW UP Taking Vyvanse 30 MG daily for concentration, is in nursing school.  She is followed by neurology.  MRI last 08/01/21 and was normal. Suspected to have seizures and neurology placed her on Lamictal 25 MG BID, which she is not currently taking.   Taking Vyvanse for ADHD (filled last 01/21/22), which se feels is losing benefit and she has been able to come off Klonopin and she requested neurology no longer fill this for her.  Last fill was, 02/04/22, but she reports she has not been taking -- has worked on reducing use of these as prefers not to take.   She continues in medication lock in program through [place] where it is stating she can only have two providers prescribing controlled substances -- PCP and neurology. Lock in for two years she reports.  ADHD status: exacerbated Satisfied with current therapy: no Medication compliance:  good compliance Controlled substance contract: yes Previous psychiatry evaluation: yes Previous medications: yes Vyvanse   Taking meds on weekends/vacations: yes Work/school performance:  good getting mainly A's in nursing school Difficulty sustaining attention/completing tasks: yes Distracted by extraneous stimuli: yes Does not listen when spoken to: no  Fidgets with hands or feet: no Unable to stay in seat: no Blurts out/interrupts others: no ADHD Medication Side Effects: no    Decreased  appetite: no    Headache: no    Sleeping disturbance pattern: no    Irritability: no    Rebound effects (worse than baseline) off medication: no    Anxiousness: no    Dizziness: no    Tics: no    02/17/2022    1:14 PM 02/17/2022    1:09 PM 01/05/2022   11:07 AM 11/27/2021   10:39 AM 08/27/2021   10:55 AM  Depression screen PHQ 2/9  Decreased Interest 0 0 0 1 1  Down, Depressed, Hopeless 0 0 0 0 0  PHQ - 2 Score 0 0 0 1 1  Altered sleeping 0 0 0 0 0  Tired, decreased energy 0 0 3 3 1   Change in appetite 0 0 3 2 0  Feeling bad or failure about yourself  0 0 0 1 1  Trouble concentrating 0 0 1 1 1   Moving slowly or fidgety/restless 0 0 0 0 0  Suicidal thoughts 0 0 0 0 0  PHQ-9 Score 0 0 7 8 4   Difficult doing work/chores Not difficult at all Not difficult at all Somewhat difficult Somewhat difficult        02/17/2022    1:15 PM 02/17/2022    1:10 PM 01/05/2022   11:07 AM 11/27/2021   10:39 AM  GAD 7 : Generalized Anxiety Score  Nervous, Anxious, on Edge 1 1 1 3   Control/stop worrying 1  1 1 2   Worry too much - different things 1 1 1 1   Trouble relaxing 1 1 1 3   Restless 1 1 0 3  Easily annoyed or irritable 1 1 0 1  Afraid - awful might happen 1 1 2 3   Total GAD 7 Score 7 7 6 16   Anxiety Difficulty Somewhat difficult  Somewhat difficult Somewhat difficult   Relevant past medical, surgical, family and social history reviewed and updated as indicated. Interim medical history since our last visit reviewed. Allergies and medications reviewed and updated.  Review of Systems  Constitutional:  Negative for activity change, appetite change, diaphoresis, fatigue and fever.  Respiratory:  Negative for cough, chest tightness and shortness of breath.   Cardiovascular:  Negative for chest pain, palpitations and leg swelling.  Gastrointestinal: Negative.   Neurological: Negative.   Psychiatric/Behavioral:  Positive for decreased concentration. Negative for self-injury, sleep disturbance and  suicidal ideas. The patient is not nervous/anxious.     Per HPI unless specifically indicated above     Objective:    BP 125/83   Pulse 82   Temp 98.1 F (36.7 C) (Oral)   Ht 5' 4.02" (1.626 m)   Wt 95 lb 1.6 oz (43.1 kg)   LMP 06/11/2021 (Approximate)   SpO2 90%   BMI 16.31 kg/m   Wt Readings from Last 3 Encounters:  02/17/22 95 lb 1.6 oz (43.1 kg)  01/19/22 96 lb (43.5 kg)  01/05/22 90 lb (40.8 kg)    Physical Exam Vitals and nursing note reviewed.  Constitutional:      General: She is awake. She is not in acute distress.    Appearance: She is well-developed and well-groomed. She is not ill-appearing or toxic-appearing.  HENT:     Head: Normocephalic.     Right Ear: Hearing and external ear normal.     Left Ear: Hearing and external ear normal.  Eyes:     General: Lids are normal.        Right eye: No discharge.        Left eye: No discharge.     Extraocular Movements: Extraocular movements intact.     Conjunctiva/sclera: Conjunctivae normal.     Pupils: Pupils are equal, round, and reactive to light.  Neck:     Thyroid: No thyromegaly.     Vascular: No carotid bruit or JVD.  Cardiovascular:     Rate and Rhythm: Normal rate and regular rhythm.     Heart sounds: Normal heart sounds. No murmur heard.    No gallop.  Pulmonary:     Effort: Pulmonary effort is normal.     Breath sounds: Normal breath sounds.  Abdominal:     General: Bowel sounds are normal.     Palpations: Abdomen is soft.  Musculoskeletal:     Cervical back: Normal range of motion and neck supple.     Right lower leg: No edema.     Left lower leg: No edema.  Lymphadenopathy:     Cervical: No cervical adenopathy.  Skin:    General: Skin is warm and dry.  Neurological:     Mental Status: She is alert and oriented to person, place, and time.     Cranial Nerves: Cranial nerves 2-12 are intact.     Sensory: Sensation is intact.     Motor: Motor function is intact.     Coordination:  Coordination is intact.     Gait: Gait is intact.     Deep Tendon Reflexes:  Reflexes are normal and symmetric.     Reflex Scores:      Brachioradialis reflexes are 2+ on the right side and 2+ on the left side.      Patellar reflexes are 2+ on the right side and 2+ on the left side. Psychiatric:        Attention and Perception: Attention normal.        Mood and Affect: Mood normal.        Speech: Speech normal.        Behavior: Behavior is cooperative.        Thought Content: Thought content normal.     Results for orders placed or performed in visit on 01/19/22  H. pylori breath test  Result Value Ref Range   H pylori Breath Test Negative Negative  Celiac Disease Panel  Result Value Ref Range   Endomysial IgA Negative Negative   Transglutaminase IgA <2 0 - 3 U/mL   IgA/Immunoglobulin A, Serum 65 (L) 87 - 352 mg/dL  Tissue transglutaminase, IgG  Result Value Ref Range   Tissue Transglut Ab <2 0 - 5 U/mL      Assessment & Plan:   Problem List Items Addressed This Visit       Other   ADHD    Chronic, ongoing and improved with Vyvanse, but is noticing some decrease in focus.  She is reducing off Klonopin prescribed by neurology and reports overall feeling better without this.  Will increase Vyvanse to 40 MG daily, discussed with her, and monitor concentration.  Controlled substance contract and UDS up to date.         Follow up plan: Return in about 3 months (around 05/19/2022) for ADHD.

## 2022-03-01 ENCOUNTER — Encounter: Payer: Self-pay | Admitting: Nurse Practitioner

## 2022-03-04 ENCOUNTER — Ambulatory Visit (INDEPENDENT_AMBULATORY_CARE_PROVIDER_SITE_OTHER): Payer: Medicaid Other | Admitting: Physician Assistant

## 2022-03-04 ENCOUNTER — Encounter: Payer: Self-pay | Admitting: Nurse Practitioner

## 2022-03-04 ENCOUNTER — Encounter: Payer: Self-pay | Admitting: Physician Assistant

## 2022-03-04 VITALS — BP 124/88 | HR 112 | Temp 99.0°F | Ht 64.02 in | Wt 93.1 lb

## 2022-03-04 DIAGNOSIS — R1011 Right upper quadrant pain: Secondary | ICD-10-CM | POA: Diagnosis not present

## 2022-03-04 DIAGNOSIS — M546 Pain in thoracic spine: Secondary | ICD-10-CM

## 2022-03-04 NOTE — Assessment & Plan Note (Signed)
Acute, new concern - reports new onset of left-sided flank/ lower back pain Reviewed potential etiologies with patient, kidney stones of which she has hx, muscular strain, UTI  Recommend imaging for stones with KUB and imaging for spine for signs of bony injury Will also order UA to rule out UTI and check for hematuria  Offered steroids - patient refused due to allergies Offered NSAIDs such as Diclofenac to replace Ibuprofen - patient refused citing desire to avoid excess medications if she can help it. Seemed to become more agitated as these options were explained.  Recommend imaging and UA to provide assistance with etiology rule out at this time.  Recommend she continue home measures of warm compresses, massage, Tylenol and Ibuprofen. May need to explore Lidocaine patch or other topical such as Voltaren for further assistance with resolution.  Follow up as needed for concerns and as indicated by imaging results and UA.

## 2022-03-04 NOTE — Patient Instructions (Addendum)
Based on your symptoms and physical exam I believe the following is the cause of your concern today Back pain likely secondary to a strain of your back muscles  I recommend the following at this time to help relieve that discomfort:  Rest Warm compresses to the area (20 minutes on, minimum of 30 minutes off) You can alternate Tylenol and Ibuprofen for pain management but Ibuprofen is typically preferred to reduce inflammation.  Gentle stretches and exercises that I have included in your paperwork Try to reduce excess strain to the area and rest as much as possible  Wear supportive shoes and, if you must lift anything, use proper lifting techniques that spare your back.   If these measures do not lead to improvement in your symptoms over the next 2-4 weeks please let us know    Please go here for your imaging  [address] [place], [place] [address]
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
## 2022-03-05 NOTE — Assessment & Plan Note (Addendum)
Acute, new concern  Unsure if related to PO intake and patient denies alleviating or aggravating factors She reports no relief with OTC pain medications, massage, warm compresses Concern for UTI, kidney stones, gallstones, pancreatitis, peptic ulcer, GERD She reports she has had UTIs and kidney stones in the past but this feels different  Recommend KUB to rule out kidney stone and UA for UTI rule out - patient did not seem amenable to this plan - orders placed in case she would like to proceed later Patient refused offered prescription medications -diclofenac and steroids Will await results of imaging and labs to guide management plan as appropriate.  Follow up as needed for persistent or progressing symptoms

## 2022-03-06 ENCOUNTER — Other Ambulatory Visit: Payer: Self-pay

## 2022-03-06 ENCOUNTER — Emergency Department: Payer: Medicaid Other

## 2022-03-06 ENCOUNTER — Encounter: Payer: Self-pay | Admitting: Nurse Practitioner

## 2022-03-06 ENCOUNTER — Emergency Department
Admission: EM | Admit: 2022-03-06 | Discharge: 2022-03-06 | Disposition: A | Discharge status: Home / Self Care | Payer: Medicaid Other | Attending: Emergency Medicine | Admitting: Emergency Medicine

## 2022-03-06 DIAGNOSIS — N83209 Unspecified ovarian cyst, unspecified side: Secondary | ICD-10-CM

## 2022-03-06 DIAGNOSIS — N200 Calculus of kidney: Secondary | ICD-10-CM | POA: Insufficient documentation

## 2022-03-06 DIAGNOSIS — R109 Unspecified abdominal pain: Secondary | ICD-10-CM

## 2022-03-06 DIAGNOSIS — N83202 Unspecified ovarian cyst, left side: Secondary | ICD-10-CM | POA: Diagnosis not present

## 2022-03-06 DIAGNOSIS — N83201 Unspecified ovarian cyst, right side: Secondary | ICD-10-CM | POA: Diagnosis not present

## 2022-03-06 DIAGNOSIS — K5900 Constipation, unspecified: Secondary | ICD-10-CM | POA: Diagnosis not present

## 2022-03-06 DIAGNOSIS — R1084 Generalized abdominal pain: Secondary | ICD-10-CM | POA: Diagnosis not present

## 2022-03-06 DIAGNOSIS — Z87891 Personal history of nicotine dependence: Secondary | ICD-10-CM | POA: Insufficient documentation

## 2022-03-06 LAB — COMPREHENSIVE METABOLIC PANEL
ALT: 11 U/L (ref 0–44)
AST: 15 U/L (ref 15–41)
Albumin: 4.1 g/dL (ref 3.5–5.0)
Alkaline Phosphatase: 45 U/L (ref 38–126)
Anion gap: 7 (ref 5–15)
BUN: 15 mg/dL (ref 6–20)
CO2: 25 mmol/L (ref 22–32)
Calcium: 8.7 mg/dL — ABNORMAL LOW (ref 8.9–10.3)
Chloride: 108 mmol/L (ref 98–111)
Creatinine, Ser: 0.67 mg/dL (ref 0.44–1.00)
GFR, Estimated: >60 mL/min (ref >60)
Glucose, Bld: 116 mg/dL — ABNORMAL HIGH (ref 70–99)
Potassium: 4.2 mmol/L (ref 3.5–5.1)
Sodium: 140 mmol/L (ref 135–145)
Total Bilirubin: 0.6 mg/dL (ref 0.3–1.2)
Total Protein: 6.5 g/dL (ref 6.5–8.1)

## 2022-03-06 LAB — CBC WITH DIFFERENTIAL/PLATELET
Abs Immature Granulocytes: 0.01 10*3/uL (ref 0.00–0.07)
Basophils Absolute: 0 10*3/uL (ref 0.0–0.1)
Basophils Relative: 0 %
Eosinophils Absolute: 0.2 10*3/uL (ref 0.0–0.5)
Eosinophils Relative: 3 %
HCT: 35.6 % — ABNORMAL LOW (ref 36.0–46.0)
Hemoglobin: 11.7 g/dL — ABNORMAL LOW (ref 12.0–15.0)
Immature Granulocytes: 0 %
Lymphocytes Relative: 36 %
Lymphs Abs: 2.1 10*3/uL (ref 0.7–4.0)
MCH: 31.4 pg (ref 26.0–34.0)
MCHC: 32.9 g/dL (ref 30.0–36.0)
MCV: 95.4 fL (ref 80.0–100.0)
Monocytes Absolute: 0.5 10*3/uL (ref 0.1–1.0)
Monocytes Relative: 8 %
Neutro Abs: 3.1 10*3/uL (ref 1.7–7.7)
Neutrophils Relative %: 53 %
Platelets: 205 10*3/uL (ref 150–400)
RBC: 3.73 MIL/uL — ABNORMAL LOW (ref 3.87–5.11)
RDW: 13.1 % (ref 11.5–15.5)
WBC: 5.8 10*3/uL (ref 4.0–10.5)
nRBC: 0 % (ref 0.0–0.2)

## 2022-03-06 LAB — LIPASE, BLOOD: Lipase: 32 U/L (ref 11–51)

## 2022-03-06 MED ORDER — IOHEXOL 300 MG/ML  SOLN
100.0000 mL | Freq: Once | INTRAMUSCULAR | Status: AC | PRN
  Administered 2022-03-06: 100 mL via INTRAVENOUS
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
## 2022-03-06 NOTE — Discharge Instructions (Signed)
Seen today in the emergency room and diagnosed with constipation/kidney stone/ovarian cyst. You have declined to stay for urinalysis.  Therefore, UTI cannot be ruled out. You should take Tylenol or ibuprofen for your pain and follow-up with your primary care doctor on Monday, as you state you have an appointment.

## 2022-03-06 NOTE — ED Triage Notes (Signed)
Pt arrives with c/o bilateral flank pain that Wednesday. Pt denies n/v. Pt also c/o ABD pain.

## 2022-03-09 ENCOUNTER — Encounter: Payer: Self-pay | Admitting: Gastroenterology

## 2022-03-09 NOTE — Telephone Encounter (Signed)
Called patient and she wants to move to the end of November because she has clinicals at school and needs to get better with her health. Moved to 05/10/2022. Called endo and talk to tori and she will get patient moved. Sent new instructions to patient

## 2022-03-10 ENCOUNTER — Encounter: Payer: Self-pay | Admitting: Nurse Practitioner

## 2022-03-10 ENCOUNTER — Ambulatory Visit: Payer: Medicaid Other | Admitting: Nurse Practitioner

## 2022-03-10 DIAGNOSIS — N83209 Unspecified ovarian cyst, unspecified side: Secondary | ICD-10-CM | POA: Diagnosis not present

## 2022-03-10 DIAGNOSIS — R300 Dysuria: Secondary | ICD-10-CM | POA: Diagnosis not present

## 2022-03-11 ENCOUNTER — Encounter: Payer: Self-pay | Admitting: Gastroenterology

## 2022-03-11 DIAGNOSIS — R634 Abnormal weight loss: Secondary | ICD-10-CM

## 2022-03-11 NOTE — Telephone Encounter (Signed)
Attempted to call patient to discuss concerns regarding office visit on 03/09/22. Unable to leave message because voicemail was full.

## 2022-03-12 NOTE — Progress Notes (Signed)
error 

## 2022-03-13 ENCOUNTER — Other Ambulatory Visit: Payer: Self-pay

## 2022-03-13 ENCOUNTER — Emergency Department: Payer: Medicaid Other

## 2022-03-13 ENCOUNTER — Emergency Department
Admission: EM | Admit: 2022-03-13 | Discharge: 2022-03-13 | Disposition: A | Discharge status: Home / Self Care | Payer: Medicaid Other | Attending: Student in an Organized Health Care Education/Training Program | Admitting: Student in an Organized Health Care Education/Training Program

## 2022-03-13 DIAGNOSIS — R21 Rash and other nonspecific skin eruption: Secondary | ICD-10-CM | POA: Diagnosis present

## 2022-03-13 DIAGNOSIS — K5900 Constipation, unspecified: Secondary | ICD-10-CM | POA: Insufficient documentation

## 2022-03-13 DIAGNOSIS — N9489 Other specified conditions associated with female genital organs and menstrual cycle: Secondary | ICD-10-CM | POA: Diagnosis not present

## 2022-03-13 DIAGNOSIS — L739 Follicular disorder, unspecified: Secondary | ICD-10-CM

## 2022-03-13 DIAGNOSIS — R109 Unspecified abdominal pain: Secondary | ICD-10-CM | POA: Diagnosis not present

## 2022-03-13 LAB — LIPASE, BLOOD: Lipase: 28 U/L (ref 11–51)

## 2022-03-13 LAB — URINALYSIS, ROUTINE W REFLEX MICROSCOPIC
Bilirubin Urine: NEGATIVE
Glucose, UA: NEGATIVE mg/dL
Hgb urine dipstick: NEGATIVE
Ketones, ur: NEGATIVE mg/dL
Leukocytes,Ua: NEGATIVE
Nitrite: NEGATIVE
Protein, ur: NEGATIVE mg/dL
Specific Gravity, Urine: 1.003 — ABNORMAL LOW (ref 1.005–1.030)
pH: 6 (ref 5.0–8.0)

## 2022-03-13 LAB — COMPREHENSIVE METABOLIC PANEL
ALT: 11 U/L (ref 0–44)
AST: 15 U/L (ref 15–41)
Albumin: 3.7 g/dL (ref 3.5–5.0)
Alkaline Phosphatase: 38 U/L (ref 38–126)
Anion gap: 6 (ref 5–15)
BUN: 14 mg/dL (ref 6–20)
CO2: 24 mmol/L (ref 22–32)
Calcium: 8.6 mg/dL — ABNORMAL LOW (ref 8.9–10.3)
Chloride: 106 mmol/L (ref 98–111)
Creatinine, Ser: 0.62 mg/dL (ref 0.44–1.00)
GFR, Estimated: >60 mL/min (ref >60)
Glucose, Bld: 104 mg/dL — ABNORMAL HIGH (ref 70–99)
Potassium: 4.2 mmol/L (ref 3.5–5.1)
Sodium: 136 mmol/L (ref 135–145)
Total Bilirubin: 1.3 mg/dL — ABNORMAL HIGH (ref 0.3–1.2)
Total Protein: 6.2 g/dL — ABNORMAL LOW (ref 6.5–8.1)

## 2022-03-13 LAB — CBC
HCT: 35.4 % — ABNORMAL LOW (ref 36.0–46.0)
Hemoglobin: 11.7 g/dL — ABNORMAL LOW (ref 12.0–15.0)
MCH: 31.3 pg (ref 26.0–34.0)
MCHC: 33.1 g/dL (ref 30.0–36.0)
MCV: 94.7 fL (ref 80.0–100.0)
Platelets: 189 10*3/uL (ref 150–400)
RBC: 3.74 MIL/uL — ABNORMAL LOW (ref 3.87–5.11)
RDW: 12.9 % (ref 11.5–15.5)
WBC: 5.2 10*3/uL (ref 4.0–10.5)
nRBC: 0 % (ref 0.0–0.2)

## 2022-03-13 LAB — POC URINE PREG, ED: Preg Test, Ur: NEGATIVE

## 2022-03-13 LAB — HCG, QUANTITATIVE, PREGNANCY: hCG, Beta Chain, Quant, S: <1 m[IU]/mL (ref <5)

## 2022-03-13 MED ORDER — DOXYCYCLINE HYCLATE 100 MG PO TABS: 100.0000 mg | ORAL_TABLET | Freq: Two times a day (BID) | ORAL | 0 refills | Status: AC

## 2022-03-13 MED ORDER — OXYCODONE HCL 5 MG PO TABS
5.0000 mg | ORAL_TABLET | Freq: Once | ORAL | Status: AC
  Administered 2022-03-13: 5 mg via ORAL
  Filled 2022-03-13: qty 1

## 2022-03-13 NOTE — ED Notes (Signed)
Pt A&ox4 ambulatory at d/c with independent steady gait. Pt verbalized understanding of d/c instructions, prescription and follow up care.

## 2022-03-13 NOTE — ED Provider Notes (Signed)
[facility] Provider Note    Event Date/Time   First MD Initiated Contact with Patient 03/13/22 1318     (approximate)   History   Rash   HPI  Megan Bentley is a 36 y.o. female Modena Jansky to the ER for evaluation of multiple complaints.  She been having weeks of abdominal pain.  Had recent CT imaging that was without acute abnormality.  Says she was told she was constipated give her self an enema and did have bowel movement but still having some discomfort.  Also complaining of painful rash in the back of her head has noted some painful red bumps.  No fevers or chills denies any dysuria.  No flank pain.     Physical Exam   Triage Vital Signs: ED Triage Vitals  Enc Vitals Group     BP 03/13/22 1133 (!) 110/90     Pulse Rate 03/13/22 1133 79     Resp 03/13/22 1133 18     Temp 03/13/22 1133 98.4 F (36.9 C)     Temp src --      SpO2 03/13/22 1133 99 %     Weight 03/13/22 1133 98 lb (44.5 kg)     Height 03/13/22 1133 5\' 4"  (1.626 m)     Head Circumference --      Peak Flow --      Pain Score 03/13/22 1138 8     Pain Loc --      Pain Edu? --      Excl. in GC? --     Most recent vital signs: Vitals:   03/13/22 1338 03/13/22 1541  BP: 113/78 109/75  Pulse: 82 75  Resp: 16 17  Temp: 98.3 F (36.8 C)   SpO2: 100% 100%     Constitutional: Alert  Eyes: Conjunctivae are normal.  Head: Atraumatic.  Small areas red eruptions appears consistent with mild cellulitis.  No fluctuance. Nose: No congestion/rhinnorhea. Mouth/Throat: Mucous membranes are moist.   Neck: Painless ROM.  Cardiovascular:   Good peripheral circulation. Respiratory: Normal respiratory effort.  No retractions.  Gastrointestinal: Soft and nontender.  Musculoskeletal:  no deformity Neurologic:  MAE spontaneously. No gross focal neurologic deficits are appreciated.  Skin:  Skin is warm, dry and intact. No rash noted. Psychiatric: Mood and affect are normal. Speech and behavior  are normal.    ED Results / Procedures / Treatments   Labs (all labs ordered are listed, but only abnormal results are displayed) Labs Reviewed  COMPREHENSIVE METABOLIC PANEL - Abnormal; Notable for the following components:      Result Value   Glucose, Bld 104 (*)    Calcium 8.6 (*)    Total Protein 6.2 (*)    Total Bilirubin 1.3 (*)    All other components within normal limits  CBC - Abnormal; Notable for the following components:   RBC 3.74 (*)    Hemoglobin 11.7 (*)    HCT 35.4 (*)    All other components within normal limits  URINALYSIS, ROUTINE W REFLEX MICROSCOPIC - Abnormal; Notable for the following components:   Color, Urine COLORLESS (*)    APPearance CLEAR (*)    Specific Gravity, Urine 1.003 (*)    All other components within normal limits  LIPASE, BLOOD  HCG, QUANTITATIVE, PREGNANCY  POC URINE PREG, ED     EKG     RADIOLOGY Please see ED Course for my review and interpretation.  I personally reviewed all radiographic images ordered to  evaluate for the above acute complaints and reviewed radiology reports and findings.  These findings were personally discussed with the patient.  Please see medical record for radiology report.    PROCEDURES:  Critical Care performed: No  Procedures   MEDICATIONS ORDERED IN ED: Medications  oxyCODONE (Oxy IR/ROXICODONE) immediate release tablet 5 mg (5 mg Oral Given 03/13/22 1343)     IMPRESSION / MDM / ASSESSMENT AND PLAN / ED COURSE  I reviewed the triage vital signs and the nursing notes.                              Differential diagnosis includes, but is not limited to, cellulitis, folliculitis, shingles, dermatitis, constipation, enteritis, SBO, doubt appendicitis, torsion, biliary pathology, colitis  Patient presented to the ER for evaluation of symptoms as described above.  She is clinically well-appearing in no acute distress.  Had recent work-up there was extensive performed in the ER no acute  findings other than some constipation.  Painful rash seems more consistent with minor folliculitis.  Doubt shingles as it is bilaterally.  Her abdominal exam is soft and benign.  Do not feel that repeat imaging clinically indicated.  Her blood work is otherwise reassuring.   Clinical Course as of 03/13/22 1646  Sat Mar 13, 2022  1528 Patient reassessed. Feels well.  X-ray without obstructive pattern.  Moderate stool burden.  Discussed conservative management.  Will be treated for mild folliculitis.  Discussed strict return precautions.  Patient agreeable to plan. [PR]    Clinical Course User Index [PR] Merlyn Lot, MD     FINAL CLINICAL IMPRESSION(S) / ED DIAGNOSES   Final diagnoses:  Folliculitis  Constipation, unspecified constipation type     Rx / DC Orders   ED Discharge Orders          Ordered    doxycycline (VIBRA-TABS) 100 MG tablet  2 times daily        03/13/22 1530             Note:  This document was prepared using Dragon voice recognition software and may include unintentional dictation errors.    Merlyn Lot, MD 03/13/22 [phone]

## 2022-03-13 NOTE — ED Notes (Signed)
Patient asked to provide a urine sample, given wipes and sterile cup and instructed on how to collect a clean catch specimen. Patient verbalized understanding. Patient attempted to obtain sample, unable to urinate at this time. Patient reports she will alert nurse when able to provide urine specimen

## 2022-03-13 NOTE — Discharge Instructions (Addendum)
Hibiclens is the name of the soap you should get at the pharmacy.  Can use once and repeat in a few days.

## 2022-03-13 NOTE — ED Triage Notes (Addendum)
Pt comes with c/o rash, abdominal pain and constipation. Pt states she has been here recently for same. Pt states she doesn't know if there is a parasite. Pt states stool hasn't been tested. Pt states rash on scalp. Pt states pain all over.   Pt denies any blood in stool. Pt states pain radiated all over to back. Pt states little pain when urinating.

## 2022-03-14 DIAGNOSIS — Z419 Encounter for procedure for purposes other than remedying health state, unspecified: Secondary | ICD-10-CM | POA: Diagnosis not present

## 2022-03-15 MED ORDER — LINACLOTIDE 290 MCG PO CAPS: 290.0000 ug | ORAL_CAPSULE | Freq: Every day | ORAL | 1 refills | Status: DC

## 2022-03-16 ENCOUNTER — Encounter: Payer: Self-pay | Admitting: Nurse Practitioner

## 2022-03-17 ENCOUNTER — Ambulatory Visit: Payer: Medicaid Other | Admitting: Nurse Practitioner

## 2022-03-17 ENCOUNTER — Telehealth: Payer: Self-pay | Admitting: Nurse Practitioner

## 2022-03-17 DIAGNOSIS — Z59819 Housing instability, housed unspecified: Secondary | ICD-10-CM

## 2022-03-17 DIAGNOSIS — F419 Anxiety disorder, unspecified: Secondary | ICD-10-CM

## 2022-03-17 DIAGNOSIS — Z87898 Personal history of other specified conditions: Secondary | ICD-10-CM

## 2022-03-17 DIAGNOSIS — F902 Attention-deficit hyperactivity disorder, combined type: Secondary | ICD-10-CM

## 2022-03-17 NOTE — Telephone Encounter (Signed)
Copied from [place] [id number]. Topic: General - Other >> Mar 17, 2022  2:08 PM Everette C wrote: Reason for CRM: The patient is interested in learning more about patient assistance programs and social work   The patient is interested in changing their current living situation and would like to know someone they could speak with about it   Please contact further when available

## 2022-03-19 NOTE — Telephone Encounter (Signed)
Reached out to patient via MyChart with [name]'s recommendations.

## 2022-03-21 NOTE — Patient Instructions (Signed)

## 2022-03-22 ENCOUNTER — Ambulatory Visit (INDEPENDENT_AMBULATORY_CARE_PROVIDER_SITE_OTHER): Payer: Medicaid Other | Admitting: Nurse Practitioner

## 2022-03-22 ENCOUNTER — Other Ambulatory Visit: Payer: Self-pay | Admitting: Licensed Clinical Social Worker

## 2022-03-22 ENCOUNTER — Encounter: Payer: Self-pay | Admitting: Nurse Practitioner

## 2022-03-22 VITALS — BP 121/81 | HR 94 | Temp 98.8°F | Ht 64.0 in | Wt 97.7 lb

## 2022-03-22 DIAGNOSIS — R768 Other specified abnormal immunological findings in serum: Secondary | ICD-10-CM

## 2022-03-22 DIAGNOSIS — R21 Rash and other nonspecific skin eruption: Secondary | ICD-10-CM | POA: Insufficient documentation

## 2022-03-22 DIAGNOSIS — Z8489 Family history of other specified conditions: Secondary | ICD-10-CM | POA: Diagnosis not present

## 2022-03-22 DIAGNOSIS — F902 Attention-deficit hyperactivity disorder, combined type: Secondary | ICD-10-CM | POA: Diagnosis not present

## 2022-03-22 MED ORDER — SARNA 0.5-0.5 % EX LOTN: 1.0000 | TOPICAL_LOTION | CUTANEOUS | 1 refills | Status: DC | PRN

## 2022-03-22 NOTE — Patient Instructions (Signed)
Visit Information  Megan Bentley was given information about Medicaid Managed Care team care coordination services as a part of their [facility] Medicaid benefit. Megan Bentley verbally consented to engagement with the [facility] Managed Care team.   If you are experiencing a medical emergency, please call 911 or report to your local emergency department or urgent care.   If you have a non-emergency medical problem during routine business hours, please contact your provider's office and ask to speak with a nurse.   For questions related to your [facility] health plan, please call: [phone] or go here:[email or web address]  If you would like to schedule transportation through your [facility] plan, please call the following number at least 2 days in advance of your appointment: [phone].  You can also use the MTM portal or MTM mobile app to manage your rides. For the portal, please go to [email or web address].  Call the [place] at [phone], at any time, 24 hours a day, 7 days a week. If you are in danger or need immediate medical attention call 911.  If you would like help to quit smoking, call 1-800-QUIT-NOW 724-665-2511) OR Espaol: 1-855-Djelo-Ya (2-458-099-8338) o para ms informacin haga clic aqu or Text READY to 200-400 to register via text    Following is a copy of your plan of care:  Care Plan : LCSW Plan of Care  Updates made by Greg Cutter, LCSW since 03/22/2022 12:00 AM     Problem: Coping Skills (General Plan of Care)      Long-Range Goal: Coping Skills Enhanced   Start Date: 03/22/2022  This Visit's Progress: On track  Note:   Priority: High  Timeframe:  Long-Range Goal Priority:  High Start Date:   03/22/22             Expected End Date:  ongoing                     Follow Up Date--04/05/22 at 1:00 pm  - keep 90 percent of scheduled appointments -consider counseling or psychiatry -consider bumping up your  self-care  -consider creating a stronger support network   Why is this important?             Beating depression/anxiety may take some time.            If you don't feel better right away, don't give up on your treatment plan.    Current barriers:   Chronic Mental Health needs related to PTSD, depression, stress and anxiety. Patient requires Support, Education, Resources, Referrals, Advocacy, and Care Coordination, in order to meet Unmet Mental Health Needs. Patient will implement clinical interventions discussed today to decrease symptoms of depression, anxiety and PTSD and increase knowledge and/or ability of: coping skills. Mental Health Concerns and Social Isolation Patient lacks knowledge of available community counseling agencies and resources.  Clinical Goal(s): verbalize understanding of plan for management of PTSD, Anxiety, Depression, and Stress and demonstrate a reduction in symptoms. Patient will connect with a provider for ongoing mental health treatment, increase coping skills, healthy habits, self-management skills, and stress reduction        Patient Goals/Self-Care Activities: Over the next 120 days Attend scheduled medical appointments Utilize healthy coping skills and supportive resources discussed Contact PCP with any questions or concerns Keep 90 percent of counseling appointments Call your insurance provider for more information about your Enhanced Benefits  Check out counseling resources provided  Begin personal counseling with LCSW, to reduce and manage  symptoms of Depression and Stress, until well-established with mental health provider Accept all calls from representative with [facility] in an effort to establish ongoing mental health counseling and supportive services. Incorporate into daily practice - relaxation techniques, deep breathing exercises, and mindfulness meditation strategies. Talk about feelings with friends, family members, spiritual advisor,  etc. Contact LCSW directly [phone]), if you have questions, need assistance, or if additional social work needs are identified between now and our next scheduled telephone outreach call. Call 988 for mental health hotline/crisis line if needed (24/7 available) Try techniques to reduce symptoms of anxiety/negative thinking (deep breathing, distraction, positive self talk, etc)  - develop a personal safety plan - develop a plan to deal with triggers like holidays, anniversaries - exercise at least 2 to 3 times per week - have a plan for how to handle bad days - journal feelings and what helps to feel better or worse - spend time or talk with others at least 2 to 3 times per week - watch for early signs of feeling worse - begin personal counseling - call and visit an old friend - check out volunteer opportunities - join a support group - laugh; watch a funny movie or comedian - learn and use visualization or guided imagery - perform a random act of kindness - practice relaxation or meditation daily - start or continue a personal journal - practice positive thinking and self-talk -continue with compliance of taking medication  -identify current effective and ineffective coping strategies.  -implement positive self-talk in care to increase self-esteem, confidence and feelings of control.  -consider alternative and complementary therapy approaches such as meditation, mindfulness or yoga.  -journaling, prayer, worship services, meditation or pastoral counseling.  -increase participation in pleasurable group activities such as hobbies, singing, sports or volunteering).  -consider the use of meditative movement therapy such as tai chi, yoga or qigong.  -start a regular daily exercise program based on tolerance, ability and patient choice to support positive thinking and activity    If you are experiencing a Mental Health or [place] or need someone to talk to, please call  the Suicide and Crisis Lifeline: 988    Patient Goals: Initial goal

## 2022-03-22 NOTE — Assessment & Plan Note (Signed)
Chronic, ongoing and improved with Vyvanse.  Continue Vyvanse 40 MG daily, discussed with her, and monitor concentration.  Controlled substance contract and UDS up to date.
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
## 2022-03-22 NOTE — Assessment & Plan Note (Addendum)
Ongoing for several weeks with waxing and waning.  ?stress related.  Will obtain labs today to further assess.  Is scheduled to see same provider her father sees upcoming on [facility]/06/22 at [facility], this will be beneficial to further assess for any similar genetic issues to her father.  Sarna lotion sent in.

## 2022-03-22 NOTE — Progress Notes (Signed)
BP 121/81   Pulse 94   Temp 98.8 F (37.1 C) (Oral)   Ht 5\' 4"  (1.626 m)   Wt 97 lb 11.2 oz (44.3 kg)   LMP 06/11/2021 (Approximate)   SpO2 96%   BMI 16.77 kg/m    Subjective:    Patient ID: Megan Bentley, female    DOB: 1986-03-04, 36 y.o.   MRN: [id number]  HPI: Megan Bentley is a 36 y.o. female  Chief Complaint  Patient presents with   ADHD   ADHD FOLLOW UP Taking Vyvanse 40 MG daily for concentration, in nursing school.  Followed by neurology.  MRI last 08/01/21 and was normal.  Scheduled to see ADHD psychiatry visit next year.   Vyvanse (filled last 02/20/22) and continue on Klonopin filled by neurology, which has been discussed at length with PCP coming off -- last fill 03/16/22.  She reports increased stressors at this time.   She continues in medication lock in program through [place] where it is stating she can only have two providers prescribing controlled substances -- PCP and neurology. Lock in for two years she reports.  ADHD status: stable Satisfied with current therapy: no Medication compliance:  good compliance Controlled substance contract: yes Previous psychiatry evaluation: yes Previous medications: yes Vyvanse   Taking meds on weekends/vacations: yes Work/school performance: good Difficulty sustaining attention/completing tasks: yes Distracted by extraneous stimuli: yes Does not listen when spoken to: no  Fidgets with hands or feet: no Unable to stay in seat: no Blurts out/interrupts others: no ADHD Medication Side Effects: no    Decreased appetite: no    Headache: no    Sleeping disturbance pattern: no    Irritability: no    Rebound effects (worse than baseline) off medication: no    Anxiousness: no    Dizziness: no    Tics: no  RASH Reports overall to scalp and hands.  She is concerned for Lupus, reports he joints hurt all the time.  Has family history of genetic disorder -- was to see Dr. Nanine Means, has not scheduled as of yet.  No current  treatment for rash.   Duration:  weeks  Location: hands and arms  Itching: yes Burning: no Redness: yes Oozing: no Scaling: no Blisters: no Painful:  occasional Fevers: no Change in detergents/soaps/personal care products: no Recent illness: no Recent travel:no History of same: no Context: fluctuating Alleviating factors: nothing Treatments attempted:nothing Shortness of breath: no  Throat/tongue swelling: no Myalgias/arthralgias: yes  Relevant past medical, surgical, family and social history reviewed and updated as indicated. Interim medical history since our last visit reviewed. Allergies and medications reviewed and updated.  Review of Systems  Constitutional:  Negative for activity change, appetite change, diaphoresis, fatigue and fever.  Respiratory:  Negative for cough, chest tightness and shortness of breath.   Cardiovascular:  Negative for chest pain, palpitations and leg swelling.  Gastrointestinal: Negative.   Neurological: Negative.   Psychiatric/Behavioral:  Positive for decreased concentration. Negative for self-injury, sleep disturbance and suicidal ideas. The patient is not nervous/anxious.     Per HPI unless specifically indicated above     Objective:    BP 121/81   Pulse 94   Temp 98.8 F (37.1 C) (Oral)   Ht 5\' 4"  (1.626 m)   Wt 97 lb 11.2 oz (44.3 kg)   LMP 06/11/2021 (Approximate)   SpO2 96%   BMI 16.77 kg/m   Wt Readings from Last 3 Encounters:  03/22/22 97 lb 11.2 oz (44.3 kg)  03/13/22 98 lb (44.5 kg)  03/06/22 98 lb (44.5 kg)    Physical Exam Vitals and nursing note reviewed.  Constitutional:      General: She is awake. She is not in acute distress.    Appearance: She is well-developed and well-groomed. She is not ill-appearing or toxic-appearing.  HENT:     Head: Normocephalic.     Right Ear: Hearing and external ear normal.     Left Ear: Hearing and external ear normal.  Eyes:     General: Lids are normal.        Right eye:  No discharge.        Left eye: No discharge.     Extraocular Movements: Extraocular movements intact.     Conjunctiva/sclera: Conjunctivae normal.     Pupils: Pupils are equal, round, and reactive to light.  Neck:     Thyroid: No thyromegaly.     Vascular: No carotid bruit or JVD.  Cardiovascular:     Rate and Rhythm: Normal rate and regular rhythm.     Heart sounds: Normal heart sounds. No murmur heard.    No gallop.  Pulmonary:     Effort: Pulmonary effort is normal.     Breath sounds: Normal breath sounds.  Abdominal:     General: Bowel sounds are normal.     Palpations: Abdomen is soft.  Musculoskeletal:     Cervical back: Normal range of motion and neck supple.     Right lower leg: No edema.     Left lower leg: No edema.  Lymphadenopathy:     Cervical: No cervical adenopathy.  Skin:    General: Skin is warm and dry.     Findings: Rash present. Rash is macular.  Neurological:     Mental Status: She is alert and oriented to person, place, and time.     Cranial Nerves: Cranial nerves 2-12 are intact.     Sensory: Sensation is intact.     Motor: Motor function is intact.     Coordination: Coordination is intact.     Gait: Gait is intact.     Deep Tendon Reflexes: Reflexes are normal and symmetric.     Reflex Scores:      Brachioradialis reflexes are 2+ on the right side and 2+ on the left side.      Patellar reflexes are 2+ on the right side and 2+ on the left side. Psychiatric:        Attention and Perception: Attention normal.        Mood and Affect: Mood normal.        Speech: Speech normal.        Behavior: Behavior is cooperative.        Thought Content: Thought content normal.     Results for orders placed or performed during the hospital encounter of 03/13/22  Lipase, blood  Result Value Ref Range   Lipase 28 11 - 51 U/L  Comprehensive metabolic panel  Result Value Ref Range   Sodium 136 135 - 145 mmol/L   Potassium 4.2 3.5 - 5.1 mmol/L   Chloride 106 98  - 111 mmol/L   CO2 24 22 - 32 mmol/L   Glucose, Bld 104 (H) 70 - 99 mg/dL   BUN 14 6 - 20 mg/dL   Creatinine, Ser 0.62 0.44 - 1.00 mg/dL   Calcium 8.6 (L) 8.9 - 10.3 mg/dL   Total Protein 6.2 (L) 6.5 - 8.1 g/dL   Albumin 3.7 3.5 -  5.0 g/dL   AST 15 15 - 41 U/L   ALT 11 0 - 44 U/L   Alkaline Phosphatase 38 38 - 126 U/L   Total Bilirubin 1.3 (H) 0.3 - 1.2 mg/dL   GFR, Estimated >60 >60 mL/min   Anion gap 6 5 - 15  CBC  Result Value Ref Range   WBC 5.2 4.0 - 10.5 K/uL   RBC 3.74 (L) 3.87 - 5.11 MIL/uL   Hemoglobin 11.7 (L) 12.0 - 15.0 g/dL   HCT 35.4 (L) 36.0 - 46.0 %   MCV 94.7 80.0 - 100.0 fL   MCH 31.3 26.0 - 34.0 pg   MCHC 33.1 30.0 - 36.0 g/dL   RDW 12.9 11.5 - 15.5 %   Platelets 189 150 - 400 K/uL   nRBC 0.0 0.0 - 0.2 %  Urinalysis, Routine w reflex microscopic  Result Value Ref Range   Color, Urine COLORLESS (A) YELLOW   APPearance CLEAR (A) CLEAR   Specific Gravity, Urine 1.003 (L) 1.005 - 1.030   pH 6.0 5.0 - 8.0   Glucose, UA NEGATIVE NEGATIVE mg/dL   Hgb urine dipstick NEGATIVE NEGATIVE   Bilirubin Urine NEGATIVE NEGATIVE   Ketones, ur NEGATIVE NEGATIVE mg/dL   Protein, ur NEGATIVE NEGATIVE mg/dL   Nitrite NEGATIVE NEGATIVE   Leukocytes,Ua NEGATIVE NEGATIVE  hCG, quantitative, pregnancy  Result Value Ref Range   hCG, Beta Chain, Quant, S <1 <5 mIU/mL  POC urine preg, ED  Result Value Ref Range   Preg Test, Ur NEGATIVE NEGATIVE      Assessment & Plan:   Problem List Items Addressed This Visit       Musculoskeletal and Integument   Rash    Ongoing for several weeks with waxing and waning.  ?stress related.  Will obtain labs today to further assess.  Is scheduled to see same provider her father sees upcoming on [facility] at [facility], this will be beneficial to further assess for any similar genetic issues to her father.  Sarna lotion sent in.      Relevant Orders   ANA 12 Plus Profile (RDL)   CBC with Differential/Platelet   Comprehensive metabolic panel    TSH     Other   ADHD - Primary    Chronic, ongoing and improved with Vyvanse.  Continue Vyvanse 40 MG daily, discussed with her, and monitor concentration.  Controlled substance contract and UDS up to date.       Family history of genetic disease    Scheduled upcoming to see [facility] to further assess.        Follow up plan: Return for as scheduled December 6th.

## 2022-03-22 NOTE — Assessment & Plan Note (Signed)
Scheduled upcoming to see [facility] to further assess.

## 2022-03-23 NOTE — Progress Notes (Signed)
Contacted via [place] afternoon [place], currently all labs are nice and normal.  Awaiting ANA results.:) Keep being awesome!!  Thank you for allowing me to participate in your care.  I appreciate you. Kindest regards, Lashane Whelpley

## 2022-03-24 ENCOUNTER — Encounter: Payer: Self-pay | Admitting: Nurse Practitioner

## 2022-03-24 ENCOUNTER — Other Ambulatory Visit: Payer: Self-pay
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
## 2022-03-25 ENCOUNTER — Encounter: Payer: Self-pay | Admitting: Nurse Practitioner

## 2022-03-25 ENCOUNTER — Telehealth (INDEPENDENT_AMBULATORY_CARE_PROVIDER_SITE_OTHER): Payer: Medicaid Other | Admitting: Nurse Practitioner

## 2022-03-25 DIAGNOSIS — R509 Fever, unspecified: Secondary | ICD-10-CM | POA: Diagnosis not present

## 2022-03-25 LAB — VERITOR FLU A/B WAIVED
Influenza A: NEGATIVE
Influenza B: NEGATIVE

## 2022-03-25 MED ORDER — ONDANSETRON HCL 4 MG PO TABS: 4.0000 mg | ORAL_TABLET | Freq: Three times a day (TID) | ORAL | 0 refills | Status: DC | PRN

## 2022-03-25 NOTE — Progress Notes (Signed)
Contacted via MyChart   Flu returned negative, we will see Covid results over the next 24 to 48 hours.  Any questions?

## 2022-03-25 NOTE — Assessment & Plan Note (Signed)
Acute for 24 hours.  Will obtain flu and Covid swabs outpatient.  Zofran sent in for nausea.  Recommend self quarantine until swabs returned.  Recommend: - Increased rest - Increasing Fluids - Acetaminophen / ibuprofen as needed for fever/pain.  - Salt water gargling, chloraseptic spray and throat lozenges - Mucinex.  - Humidifying the air.

## 2022-03-25 NOTE — Patient Instructions (Signed)
Fever, Adult     A fever is an increase in your body's temperature. It often means a temperature of 100.4F (38C) or higher. Brief mild or moderate fevers often have no long-term effects. They often do not need treatment. Moderate or high fevers may make you feel uncomfortable. Sometimes, they can be a sign of a serious illness or disease. A fever that keeps coming back or that lasts a long time may cause you to lose water in your body (get dehydrated). You can take your temperature with a thermometer to see if you have a fever. Temperature can change with: Age. Time of day. Where the thermometer is put in the body. Readings may vary when the thermometer is put: In the mouth (oral). In the butt (rectal). In the ear (tympanic). Under the arm (axillary). On the forehead (temporal). Follow these instructions at home: Medicines Take over-the-counter and prescription medicines only as told by your doctor. Follow the dosing instructions carefully. If you were prescribed an antibiotic medicine, take it as told by your doctor. Do not stop taking it even if you start to feel better. General instructions Watch for any changes in your symptoms. Tell your doctor about them. Rest as needed. Drink enough fluid to keep your pee (urine) pale yellow. Sponge yourself or bathe with room-temperature water as needed. This helps to lower your body temperature. Do not use ice water. Do not use too many blankets or wear clothes that are too heavy. If your fever was caused by an infection that spreads from person to person (is contagious), such as a cold or the flu: You should stay home from work and public places for at least 24 hours after your fever is gone. Your fever should be gone for at least 24 hours without the need to use medicines. Contact a doctor if: You throw up (vomit). You cannot eat or drink without throwing up. You have watery poop (diarrhea). It hurts when you pee. Your symptoms do not get  better with treatment. You have new symptoms. You feel very weak. Get help right away if: You are short of breath or have trouble breathing. You are dizzy or you pass out (faint). You feel mixed up (confused). You have signs of not having enough water in your body, such as: Dark pee, very little pee, or no pee. Cracked lips. Dry mouth. Sunken eyes. Sleepiness. Weakness. You have very bad pain in your belly (abdomen). You keep throwing up or having watery poop. You have a rash on your skin. Your symptoms get worse all of a sudden. Summary A fever is an increase in your body's temperature. It often means a temperature of 100.4F (38C) or higher. Watch for any changes in your symptoms. Tell your doctor about them. Take all medicines only as told by your doctor. Do not go to work or other public places if your fever was caused by an illness that can spread to other people. Get help right away if you have signs that you do not have enough water in your body. This information is not intended to replace advice given to you by your health care provider. Make sure you discuss any questions you have with your health care provider. Document Revised: 09/28/2021 Document Reviewed: 10/21/2020 Elsevier Patient Education  2023 Elsevier Inc.  

## 2022-03-25 NOTE — Progress Notes (Signed)
LMP 06/11/2021 (Approximate)    Subjective:    Patient ID: Megan Bentley, female    DOB: [birth year], 36 y.o.   MRN: [id number]  HPI: Megan Bentley is a 36 y.o. female  Chief Complaint  Patient presents with   Covid Exposure    Patient says she became symptomatic yesterday. Patient says when she checked her temperature yesterday it was 102. Patient says she has tried over the counter Tylenol, DayQuil and NightQuil. Patient says she has not noticed any fever this morning. Patient declines taking any COVID at home test.    Headache   Nausea   Fever   Excessive Sweating   This visit was completed via video visit through MyChart due to the restrictions of the COVID-19 pandemic. All issues as above were discussed and addressed. Physical exam was done as above through visual confirmation on video through MyChart. If it was felt that the patient should be evaluated in the office, they were directed there. The patient verbally consented to this visit. Location of the patient: home Location of the provider: work Those involved with this call:  Provider: Marnee Guarneri, DNP CMA: Irena Reichmann, [place] Desk/Registration: [employer]  Time spent on call:  21 minutes with patient face to face via video conference. More than 50% of this time was spent in counseling and coordination of care. 15 minutes total spent in review of patient's record and preparation of their chart.  I verified patient identity using two factors (patient name and date of birth). Patient consents verbally to being seen via telemedicine visit today.    UPPER RESPIRATORY TRACT INFECTION Started with symptoms yesterday -- was nauseous and head started hurting.  Has not had flu shot as of yet and has not had Covid vaccines. Worst symptom: Fever: yes 102 this morning Cough: no Shortness of breath: no Wheezing: no Chest pain: no Chest tightness: no Chest congestion: no Nasal congestion: yes Runny nose: yes Post  nasal drip: yes Sneezing: no Sore throat: yes Swollen glands: no Sinus pressure: yes Headache: yes Face pain: yes Toothache: no Ear pain: none Ear pressure: none Eyes red/itching:no Eye drainage/crusting: no  Vomiting: yes Rash: no Fatigue: yes Sick contacts: yes Strep contacts: no  Context: fluctuating Recurrent sinusitis: no Relief with OTC cold/cough medications: yes  Treatments attempted:   Nyquil  Relevant past medical, surgical, family and social history reviewed and updated as indicated. Interim medical history since our last visit reviewed. Allergies and medications reviewed and updated.  Review of Systems  Constitutional:  Positive for chills, diaphoresis, fatigue and fever. Negative for activity change and appetite change.  HENT:  Positive for congestion, postnasal drip, rhinorrhea and sore throat. Negative for ear discharge, ear pain, facial swelling, sinus pressure, sinus pain, sneezing and voice change.   Eyes:  Negative for pain.  Respiratory: Negative.    Cardiovascular:  Negative for chest pain, palpitations and leg swelling.  Gastrointestinal:  Positive for nausea and vomiting. Negative for abdominal distention, abdominal pain, constipation and diarrhea.  Endocrine: Negative.   Musculoskeletal:  Positive for myalgias.  Neurological:  Positive for headaches. Negative for dizziness and numbness.  Psychiatric/Behavioral: Negative.      Per HPI unless specifically indicated above     Objective:    LMP 06/11/2021 (Approximate)   Wt Readings from Last 3 Encounters:  03/22/22 97 lb 11.2 oz (44.3 kg)  03/13/22 98 lb (44.5 kg)  03/06/22 98 lb (44.5 kg)    Physical Exam Vitals and nursing  note reviewed.  Constitutional:      General: She is awake. She is not in acute distress.    Appearance: She is well-developed and well-groomed. She is ill-appearing. She is not toxic-appearing.  HENT:     Head: Normocephalic.     Right Ear: Hearing normal.     Left  Ear: Hearing normal.  Eyes:     General: Lids are normal.        Right eye: No discharge.        Left eye: No discharge.     Conjunctiva/sclera: Conjunctivae normal.  Pulmonary:     Effort: Pulmonary effort is normal. No accessory muscle usage or respiratory distress.  Musculoskeletal:     Cervical back: Normal range of motion.  Neurological:     Mental Status: She is alert and oriented to person, place, and time.  Psychiatric:        Attention and Perception: Attention normal.        Mood and Affect: Mood normal.        Behavior: Behavior normal. Behavior is cooperative.        Thought Content: Thought content normal.        Judgment: Judgment normal.     Results for orders placed or performed in visit on 03/22/22  ANA 12 Plus Profile (RDL)  Result Value Ref Range   Anti-Nuclear Ab by IFA (RDL) WILL FOLLOW   CBC with Differential/Platelet  Result Value Ref Range   WBC 4.8 3.4 - 10.8 x10E3/uL   RBC 3.90 3.77 - 5.28 x10E6/uL   Hemoglobin 12.3 11.1 - 15.9 g/dL   Hematocrit 36.9 34.0 - 46.6 %   MCV 95 79 - 97 fL   MCH 31.5 26.6 - 33.0 pg   MCHC 33.3 31.5 - 35.7 g/dL   RDW 12.7 11.7 - 15.4 %   Platelets 234 150 - 450 x10E3/uL   Neutrophils 62 Not Estab. %   Lymphs 30 Not Estab. %   Monocytes 6 Not Estab. %   Eos 2 Not Estab. %   Basos 0 Not Estab. %   Neutrophils Absolute 2.9 1.4 - 7.0 x10E3/uL   Lymphocytes Absolute 1.5 0.7 - 3.1 x10E3/uL   Monocytes Absolute 0.3 0.1 - 0.9 x10E3/uL   EOS (ABSOLUTE) 0.1 0.0 - 0.4 x10E3/uL   Basophils Absolute 0.0 0.0 - 0.2 x10E3/uL   Immature Granulocytes 0 Not Estab. %   Immature Grans (Abs) 0.0 0.0 - 0.1 x10E3/uL  Comprehensive metabolic panel  Result Value Ref Range   Glucose 87 70 - 99 mg/dL   BUN 10 6 - 20 mg/dL   Creatinine, Ser 0.76 0.57 - 1.00 mg/dL   eGFR 106 >59 mL/min/1.73   BUN/Creatinine Ratio 13 9 - 23   Sodium 137 134 - 144 mmol/L   Potassium 3.8 3.5 - 5.2 mmol/L   Chloride 101 96 - 106 mmol/L   CO2 23 20 - 29  mmol/L   Calcium 9.0 8.7 - 10.2 mg/dL   Total Protein 6.7 6.0 - 8.5 g/dL   Albumin 4.4 3.9 - 4.9 g/dL   Globulin, Total 2.3 1.5 - 4.5 g/dL   Albumin/Globulin Ratio 1.9 1.2 - 2.2   Bilirubin Total 0.6 0.0 - 1.2 mg/dL   Alkaline Phosphatase 58 44 - 121 IU/L   AST 11 0 - 40 IU/L   ALT 8 0 - 32 IU/L  TSH  Result Value Ref Range   TSH 1.240 0.450 - 4.500 uIU/mL      Assessment &  Plan:   Problem List Items Addressed This Visit       Other   Fever - Primary    Acute for 24 hours.  Will obtain flu and Covid swabs outpatient.  Zofran sent in for nausea.  Recommend self quarantine until swabs returned.  Recommend: - Increased rest - Increasing Fluids - Acetaminophen / ibuprofen as needed for fever/pain.  - Salt water gargling, chloraseptic spray and throat lozenges - Mucinex.  - Humidifying the air.      Relevant Orders   Novel Coronavirus, NAA ([facility])   Veritor Flu A/B Waived    I discussed the assessment and treatment plan with the patient. The patient was provided an opportunity to ask questions and all were answered. The patient agreed with the plan and demonstrated an understanding of the instructions.   The patient was advised to call back or seek an in-person evaluation if the symptoms worsen or if the condition fails to improve as anticipated.   I provided 21+ minutes of time during this encounter.    Follow up plan: Return if symptoms worsen or fail to improve.

## 2022-03-26 LAB — NOVEL CORONAVIRUS, NAA: SARS-CoV-2, NAA: NOT DETECTED

## 2022-03-26 NOTE — Progress Notes (Signed)
Contacted via MyChart   Covid testing is negative!!

## 2022-03-27 NOTE — Addendum Note (Signed)
Addended by: Marnee Guarneri T on: 03/27/2022 02:56 PM   Modules accepted: Orders

## 2022-03-27 NOTE — Progress Notes (Signed)
Contacted via [place] afternoon [place], your ANA did return positive on this check with speckled pattern 1:160 -- this means there could be autoimmune disease present, still waiting on remainder of ANA results. However, I do recommend we place a referral to rheumatology for further assessment of this and your symptoms.  I will place this referral today and you should hear from someone over the next week to schedule.  Any questions? Keep being amazing!!  Thank you for allowing me to participate in your care.  I appreciate you. Kindest regards, Shaylea Ucci

## 2022-04-01 LAB — COMPREHENSIVE METABOLIC PANEL
ALT: 8 IU/L (ref 0–32)
AST: 11 IU/L (ref 0–40)
Albumin/Globulin Ratio: 1.9 (ref 1.2–2.2)
Albumin: 4.4 g/dL (ref 3.9–4.9)
Alkaline Phosphatase: 58 IU/L (ref 44–121)
BUN/Creatinine Ratio: 13 (ref 9–23)
BUN: 10 mg/dL (ref 6–20)
Bilirubin Total: 0.6 mg/dL (ref 0.0–1.2)
CO2: 23 mmol/L (ref 20–29)
Calcium: 9 mg/dL (ref 8.7–10.2)
Chloride: 101 mmol/L (ref 96–106)
Creatinine, Ser: 0.76 mg/dL (ref 0.57–1.00)
Globulin, Total: 2.3 g/dL (ref 1.5–4.5)
Glucose: 87 mg/dL (ref 70–99)
Potassium: 3.8 mmol/L (ref 3.5–5.2)
Sodium: 137 mmol/L (ref 134–144)
Total Protein: 6.7 g/dL (ref 6.0–8.5)
eGFR: 106 mL/min/{1.73_m2} (ref >59)

## 2022-04-01 LAB — CBC WITH DIFFERENTIAL/PLATELET
Basophils Absolute: 0 10*3/uL (ref 0.0–0.2)
Basos: 0 %
EOS (ABSOLUTE): 0.1 10*3/uL (ref 0.0–0.4)
Eos: 2 %
Hematocrit: 36.9 % (ref 34.0–46.6)
Hemoglobin: 12.3 g/dL (ref 11.1–15.9)
Immature Grans (Abs): 0 10*3/uL (ref 0.0–0.1)
Immature Granulocytes: 0 %
Lymphocytes Absolute: 1.5 10*3/uL (ref 0.7–3.1)
Lymphs: 30 %
MCH: 31.5 pg (ref 26.6–33.0)
MCHC: 33.3 g/dL (ref 31.5–35.7)
MCV: 95 fL (ref 79–97)
Monocytes Absolute: 0.3 10*3/uL (ref 0.1–0.9)
Monocytes: 6 %
Neutrophils Absolute: 2.9 10*3/uL (ref 1.4–7.0)
Neutrophils: 62 %
Platelets: 234 10*3/uL (ref 150–450)
RBC: 3.9 x10E6/uL (ref 3.77–5.28)
RDW: 12.7 % (ref 11.7–15.4)
WBC: 4.8 10*3/uL (ref 3.4–10.8)

## 2022-04-01 LAB — ANA 12 PLUS PROFILE, POSITIVE
Anti-CCP Ab, IgG & IgA (RDL): <20 Units (ref <20)
Anti-Cardiolipin Ab, IgA (RDL): <12 APL U/mL (ref <12)
Anti-Cardiolipin Ab, IgG (RDL): <15 GPL U/mL (ref <15)
Anti-Cardiolipin Ab, IgM (RDL): <13 MPL U/mL (ref <13)
Anti-Centromere Ab (RDL): <1:40 {titer}
Anti-Chromatin Ab, IgG (RDL): <20 Units (ref <20)
Anti-La (SS-B) Ab (RDL): <20 Units (ref <20)
Anti-Ro (SS-A) Ab (RDL): <20 Units (ref <20)
Anti-Scl-70 Ab (RDL): <20 Units (ref <20)
Anti-Sm Ab (RDL): <20 Units (ref <20)
Anti-TPO Ab (RDL): <9 IU/mL (ref <9.0)
Anti-U1 RNP Ab (RDL): <20 Units (ref <20)
Anti-dsDNA Ab by Farr(RDL): <8 IU/mL (ref <8.0)
C3 Complement (RDL): 125 mg/dL (ref 82–167)
C4 Complement (RDL): 24 mg/dL (ref 14–44)
Rheumatoid Factor by Turb RDL: <14 IU/mL (ref <14)
Speckled Pattern: 1:160 {titer} — ABNORMAL HIGH

## 2022-04-01 LAB — ANA 12 PLUS PROFILE (RDL): Anti-Nuclear Ab by IFA (RDL): POSITIVE — AB

## 2022-04-01 LAB — TSH: TSH: 1.24 u[IU]/mL (ref 0.450–4.500)

## 2022-04-01 NOTE — Progress Notes (Signed)
Good morning, I received a no check MyChart message for her, please call and ensure she received below message: Good afternoon Megan Bentley, your ANA did return positive on this check with speckled pattern 1:160 -- this means there could be autoimmune disease present, remainder of this is all normal. However, I do recommend we place a referral to rheumatology for further assessment of this and your symptoms. I will place this referral today and you should hear from someone over the next week to schedule. Any questions? Keep being amazing!! Thank you for allowing me to participate in your care. I appreciate you. Kindest regards, Wetona Viramontes

## 2022-04-05 ENCOUNTER — Ambulatory Visit: Payer: Self-pay

## 2022-04-08 DIAGNOSIS — G4489 Other headache syndrome: Secondary | ICD-10-CM | POA: Diagnosis not present

## 2022-04-08 DIAGNOSIS — R413 Other amnesia: Secondary | ICD-10-CM | POA: Diagnosis not present

## 2022-04-08 DIAGNOSIS — R200 Anesthesia of skin: Secondary | ICD-10-CM | POA: Diagnosis not present

## 2022-04-08 DIAGNOSIS — R5382 Chronic fatigue, unspecified: Secondary | ICD-10-CM | POA: Diagnosis not present

## 2022-04-08 DIAGNOSIS — R569 Unspecified convulsions: Secondary | ICD-10-CM | POA: Diagnosis not present

## 2022-04-08 DIAGNOSIS — F419 Anxiety disorder, unspecified: Secondary | ICD-10-CM | POA: Diagnosis not present

## 2022-04-08 DIAGNOSIS — R42 Dizziness and giddiness: Secondary | ICD-10-CM | POA: Diagnosis not present

## 2022-04-08 DIAGNOSIS — R55 Syncope and collapse: Secondary | ICD-10-CM | POA: Diagnosis not present

## 2022-04-08 DIAGNOSIS — F411 Generalized anxiety disorder: Secondary | ICD-10-CM | POA: Diagnosis not present

## 2022-04-08 DIAGNOSIS — F410 Panic disorder [episodic paroxysmal anxiety] without agoraphobia: Secondary | ICD-10-CM | POA: Diagnosis not present

## 2022-04-08 DIAGNOSIS — R6889 Other general symptoms and signs: Secondary | ICD-10-CM | POA: Diagnosis not present

## 2022-04-13 ENCOUNTER — Other Ambulatory Visit: Payer: Self-pay
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
## 2022-04-13 NOTE — Patient Instructions (Signed)
Visit Information  Megan Bentley was given information about Medicaid Managed Care team care coordination services as a part of their [facility] Medicaid benefit. Megan Bentley verbally consented to engagement with the [facility] Managed Care team.   If you are experiencing a medical emergency, please call 911 or report to your local emergency department or urgent care.   If you have a non-emergency medical problem during routine business hours, please contact your provider's office and ask to speak with a nurse.   For questions related to your [facility] health plan, please call: [phone] or go here:[email or web address]  If you would like to schedule transportation through your [facility] plan, please call the following number at least 2 days in advance of your appointment: [phone].  You can also use the MTM portal or MTM mobile app to manage your rides. For the portal, please go to [email or web address].  Call the [place] at [phone], at any time, 24 hours a day, 7 days a week. If you are in danger or need immediate medical attention call 911.  If you would like help to quit smoking, call 1-800-QUIT-NOW 219 806 3088) OR Espaol: 1-855-Djelo-Ya (4-097-353-2992) o para ms informacin haga clic aqu or Text READY to 200-400 to register via text  Megan Bentley - following are the goals we discussed in your visit today:   Goals Addressed   None      Social Worker will follow up in 30 days .   Mickel Fuchs, BSW, [place] Managed Medicaid Team  [phone]   Following is a copy of your plan of care:  There are no care plans that you recently modified to display for this patient.

## 2022-04-14 DIAGNOSIS — Z419 Encounter for procedure for purposes other than remedying health state, unspecified: Secondary | ICD-10-CM | POA: Diagnosis not present

## 2022-04-21 NOTE — Telephone Encounter (Signed)
Late entry: Spoke with patient regarding her concerns on 03/10/22. Also, spoke with Jacquelin Hawking, PA regarding patient's concerns. Issue resolved.

## 2022-04-22 ENCOUNTER — Encounter: Payer: Self-pay | Admitting: Nurse Practitioner

## 2022-05-09 ENCOUNTER — Other Ambulatory Visit: Payer: Self-pay | Admitting: Nurse Practitioner

## 2022-05-09 DIAGNOSIS — R102 Pelvic and perineal pain: Secondary | ICD-10-CM | POA: Diagnosis not present

## 2022-05-09 DIAGNOSIS — N898 Other specified noninflammatory disorders of vagina: Secondary | ICD-10-CM | POA: Diagnosis not present

## 2022-05-09 DIAGNOSIS — M545 Low back pain, unspecified: Secondary | ICD-10-CM | POA: Diagnosis not present

## 2022-05-09 DIAGNOSIS — K5900 Constipation, unspecified: Secondary | ICD-10-CM | POA: Diagnosis not present

## 2022-05-09 DIAGNOSIS — Z87891 Personal history of nicotine dependence: Secondary | ICD-10-CM | POA: Diagnosis not present

## 2022-05-09 DIAGNOSIS — M329 Systemic lupus erythematosus, unspecified: Secondary | ICD-10-CM | POA: Diagnosis not present

## 2022-05-09 DIAGNOSIS — R110 Nausea: Secondary | ICD-10-CM | POA: Diagnosis not present

## 2022-05-09 DIAGNOSIS — N83201 Unspecified ovarian cyst, right side: Secondary | ICD-10-CM | POA: Diagnosis not present

## 2022-05-09 DIAGNOSIS — Z882 Allergy status to sulfonamides status: Secondary | ICD-10-CM | POA: Diagnosis not present

## 2022-05-09 DIAGNOSIS — Z79899 Other long term (current) drug therapy: Secondary | ICD-10-CM | POA: Diagnosis not present

## 2022-05-09 DIAGNOSIS — N200 Calculus of kidney: Secondary | ICD-10-CM | POA: Diagnosis not present

## 2022-05-09 DIAGNOSIS — M069 Rheumatoid arthritis, unspecified: Secondary | ICD-10-CM | POA: Diagnosis not present

## 2022-05-09 DIAGNOSIS — R1031 Right lower quadrant pain: Secondary | ICD-10-CM | POA: Diagnosis not present

## 2022-05-10 ENCOUNTER — Encounter: Admission: RE | Payer: Self-pay | Source: Home / Self Care

## 2022-05-10 ENCOUNTER — Telehealth: Payer: Self-pay

## 2022-05-10 ENCOUNTER — Encounter: Payer: Self-pay | Admitting: Registered Nurse

## 2022-05-10 ENCOUNTER — Ambulatory Visit: Admission: RE | Admit: 2022-05-10 | Payer: Medicaid Other | Source: Home / Self Care | Admitting: Gastroenterology

## 2022-05-10 SURGERY — COLONOSCOPY WITH PROPOFOL
Anesthesia: General

## 2022-05-10 NOTE — Telephone Encounter (Signed)
Transition Care Management Follow-up Telephone Call Date of discharge and from where: [facility] Emergency Department- [facility], 05/09/22 How have you been since you were released from the hospital? Still hurting Any questions or concerns? No  Items Reviewed: Did the pt receive and understand the discharge instructions provided? Yes  Medications obtained and verified? Yes  Other? No  Any new allergies since your discharge? No  Dietary orders reviewed? Yes Do you have support at home? Yes   Home Care and Equipment/Supplies: Were home health services ordered? no If so, what is the name of the agency? N/A  Has the agency set up a time to come to the patient's home? not applicable Were any new equipment or medical supplies ordered?  No What is the name of the medical supply agency? N/A Were you able to get the supplies/equipment? not applicable Do you have any questions related to the use of the equipment or supplies? No  Functional Questionnaire: (I = Independent and D = Dependent) ADLs: I  Bathing/Dressing- I  Meal Prep- I  Eating- I  Maintaining continence- I  Transferring/Ambulation- I  Managing Meds- I  Follow up appointments reviewed:  PCP Hospital f/u appt confirmed? Yes  Scheduled to see Aura Dials, DNP on 05/19/22 @ 8:40 AM. Specialist Hospital f/u appt confirmed? No   Are transportation arrangements needed? No  If their condition worsens, is the pt aware to call PCP or go to the Emergency Dept.? Yes Was the patient provided with contact information for the PCP's office or ED? Yes Was to pt encouraged to call back with questions or concerns? Yes

## 2022-05-14 ENCOUNTER — Other Ambulatory Visit: Payer: Medicaid Other

## 2022-05-14 DIAGNOSIS — Z419 Encounter for procedure for purposes other than remedying health state, unspecified: Secondary | ICD-10-CM | POA: Diagnosis not present

## 2022-05-14 NOTE — Patient Instructions (Signed)
  Medicaid Managed Care   Unsuccessful Outreach Note  05/14/2022 Name: Christl L Hussey MRN: [id number] DOB: 03/29/1986  Referred by: Cannady, [name] T, NP Reason for referral : High Risk Managed Medicaid (MM Social work telephone outreach )   An unsuccessful telephone outreach was attempted today. The patient was referred to the case management team for assistance with care management and care coordination.   Follow Up Plan: A HIPAA compliant phone message was left for the patient providing contact information and requesting a return call.   Rexanne Inocencio, BSW, MHA [facility]  [place]  High Risk Managed Medicaid Team  [phone]  

## 2022-05-14 NOTE — Patient Outreach (Signed)
  Medicaid Managed Care   Unsuccessful Outreach Note  05/14/2022 Name: Megan Bentley MRN: [id number] DOB: 09-16-1985  Referred by: Marjie Skiff, NP Reason for referral : High Risk Managed Medicaid (MM Social work telephone outreach )   An unsuccessful telephone outreach was attempted today. The patient was referred to the case management team for assistance with care management and care coordination.   Follow Up Plan: A HIPAA compliant phone message was left for the patient providing contact information and requesting a return call.   Gus Puma, BSW, [place] [facility]  [place]  High Risk Managed Medicaid Team  [phone]

## 2022-05-16 NOTE — Patient Instructions (Incomplete)
Attention Deficit Hyperactivity Disorder, Adult Attention deficit hyperactivity disorder (ADHD) is a mental health disorder that starts during childhood. For many people with ADHD, the disorder continues into the adult years. Treatment can help you manage your symptoms. There are three main types of ADHD: Inattentive. With this type, adults have difficulty paying attention. This may affect cognitive abilities. Hyperactive-impulsive. With this type, adults have a lot of energy and have difficulty controlling their behavior. Combination type. Some people may have symptoms of both types. What are the causes? The exact cause of ADHD is not known. Most experts believe a person's genes and environment possibly contribute to ADHD. What increases the risk? The following factors may make you more likely to develop this condition: Having a first-degree relative such as a parent, brother, or sister, with the condition. Being born before 37 weeks of pregnancy (prematurely) or at a low birth weight. Being born to a mother who smoked tobacco or drank alcohol during pregnancy. Having experienced a brain injury. Being exposed to lead or other toxins in the womb or early in life. What are the signs or symptoms? Symptoms of this condition depend on the type of ADHD. Symptoms of the inattentive type include: Difficulty paying attention or following instructions. Often making simple mistakes. Being disorganized. Avoiding tasks that require time and attention. Losing and forgetting things. Symptoms of the hyperactive-impulsive type include: Restlessness. Talking out of turn, interrupting others, or talking too much. Difficulty with: Sitting still. Feeling motivated. Relaxing. Waiting in line or waiting for a turn. People with the combination type have symptoms of both of the other types. In adults, this condition may lead to certain problems, such as: Keeping jobs. Performing tasks at work. Having  stable relationships. Being on time or keeping to a schedule. How is this diagnosed? This condition is diagnosed based on your current symptoms and your history of symptoms. The diagnosis can be made by a health care provider such as a primary care provider or a mental health care specialist. Your health care provider may use a symptom checklist or a behavior rating scale to evaluate your symptoms. Your health care provider may also want to talk with people who have observed your behaviors throughout your life. How is this treated? This condition can be treated with medicines and behavior therapy. Medicines may be the best option to reduce impulsive behaviors and improve attention. Your health care provider may recommend: Stimulant medicines. These are the most common medicines used for adult ADHD. They affect certain chemicals in the brain (neurotransmitters) and improve your ability to control your symptoms. A non-stimulant medicine. These medicines can also improve focus, attention, and impulsive behavior. It may take weeks to months to see the effects of this medicine. Counseling and behavioral management are also important for treating ADHD. Counseling is often used along with medicine. Your health care provider may suggest: Cognitive behavioral therapy (CBT). This type of therapy teaches you to replace negative thoughts and actions with positive thoughts and actions. When used as part of ADHD treatment, this therapy may also include: Coping strategies for organization, time management, impulse control, and stress reduction. Mindfulness and meditation training. Behavioral management. You may work with a coach who is specially trained to help people with ADHD manage and organize activities and function more effectively. Follow these instructions at home: Medicines  Take over-the-counter and prescription medicines only as told by your health care provider. Talk with your health care provider  about the possible side effects of your medicines and   how to manage them. Alcohol use Do not drink alcohol if: Your health care provider tells you not to drink. You are pregnant, may be pregnant, or are planning to become pregnant. If you drink alcohol: Limit how much you use to: 0-1 drink a day for women. 0-2 drinks a day for men. Know how much alcohol is in your drink. In the [place] one drink equals one 12 oz bottle of beer (355 mL), one 5 oz glass of wine (148 mL), or one 1 oz glass of hard liquor (44 mL). Lifestyle  Do not use illegal drugs. Get enough sleep. Eat a healthy diet. Exercise regularly. Exercise can help to reduce stress and anxiety. General instructions Learn as much as you can about adult ADHD, and work closely with your health care providers to find the treatments that work best for you. Follow the same schedule each day. Use reminder devices like notes, calendars, and phone apps to stay on time and organized. Keep all follow-up visits. Your health care provider will need to monitor your condition and adjust your treatment over time. Where to find more information A health care provider may be able to recommend resources that are available online or over the phone. You could start with: Attention Deficit Disorder Association (ADDA): add.org National Institute of Mental Health (NIMH): nimh.nih.gov Contact a health care provider if: Your symptoms continue to cause problems. You have side effects from your medicine, such as: Repeated muscle twitches, coughing, or speech outbursts. Sleep problems. Loss of appetite. Dizziness. Unusually fast heartbeat. Stomach pains. Headaches. You are struggling with anxiety, depression, or substance abuse. Get help right away if: You have a severe reaction to a medicine. This symptom may be an emergency. Get help right away. Call 911. Do not wait to see if the symptom will go away. Do not drive yourself to the hospital. Take  one of these steps if you feel like you may hurt yourself or others, or have thoughts about taking your own life: Go to your nearest emergency room. Call 911. Call the National Suicide Prevention Lifeline at 1-800-273-8255 or 988. This is open 24 hours a day Text the Crisis Text Line at 741741. Summary ADHD is a mental health disorder that starts during childhood and often continues into your adult years. The exact cause of ADHD is not known. Most experts believe genetics and environmental factors contribute to ADHD. There is no cure for ADHD, but treatment with medicine, cognitive behavioral therapy, or behavioral management can help you manage your condition. This information is not intended to replace advice given to you by your health care provider. Make sure you discuss any questions you have with your health care provider. Document Revised: 09/18/2021 Document Reviewed: 09/18/2021 Elsevier Patient Education  2023 Elsevier Inc.  

## 2022-05-16 NOTE — Patient Instructions (Signed)
Attention Deficit Hyperactivity Disorder, Adult Attention deficit hyperactivity disorder (ADHD) is a mental health disorder that starts during childhood. For many people with ADHD, the disorder continues into the adult years. Treatment can help you manage your symptoms. There are three main types of ADHD: Inattentive. With this type, adults have difficulty paying attention. This may affect cognitive abilities. Hyperactive-impulsive. With this type, adults have a lot of energy and have difficulty controlling their behavior. Combination type. Some people may have symptoms of both types. What are the causes? The exact cause of ADHD is not known. Most experts believe a person's genes and environment possibly contribute to ADHD. What increases the risk? The following factors may make you more likely to develop this condition: Having a first-degree relative such as a parent, brother, or sister, with the condition. Being born before 37 weeks of pregnancy (prematurely) or at a low birth weight. Being born to a mother who smoked tobacco or drank alcohol during pregnancy. Having experienced a brain injury. Being exposed to lead or other toxins in the womb or early in life. What are the signs or symptoms? Symptoms of this condition depend on the type of ADHD. Symptoms of the inattentive type include: Difficulty paying attention or following instructions. Often making simple mistakes. Being disorganized. Avoiding tasks that require time and attention. Losing and forgetting things. Symptoms of the hyperactive-impulsive type include: Restlessness. Talking out of turn, interrupting others, or talking too much. Difficulty with: Sitting still. Feeling motivated. Relaxing. Waiting in line or waiting for a turn. People with the combination type have symptoms of both of the other types. In adults, this condition may lead to certain problems, such as: Keeping jobs. Performing tasks at work. Having  stable relationships. Being on time or keeping to a schedule. How is this diagnosed? This condition is diagnosed based on your current symptoms and your history of symptoms. The diagnosis can be made by a health care provider such as a primary care provider or a mental health care specialist. Your health care provider may use a symptom checklist or a behavior rating scale to evaluate your symptoms. Your health care provider may also want to talk with people who have observed your behaviors throughout your life. How is this treated? This condition can be treated with medicines and behavior therapy. Medicines may be the best option to reduce impulsive behaviors and improve attention. Your health care provider may recommend: Stimulant medicines. These are the most common medicines used for adult ADHD. They affect certain chemicals in the brain (neurotransmitters) and improve your ability to control your symptoms. A non-stimulant medicine. These medicines can also improve focus, attention, and impulsive behavior. It may take weeks to months to see the effects of this medicine. Counseling and behavioral management are also important for treating ADHD. Counseling is often used along with medicine. Your health care provider may suggest: Cognitive behavioral therapy (CBT). This type of therapy teaches you to replace negative thoughts and actions with positive thoughts and actions. When used as part of ADHD treatment, this therapy may also include: Coping strategies for organization, time management, impulse control, and stress reduction. Mindfulness and meditation training. Behavioral management. You may work with a coach who is specially trained to help people with ADHD manage and organize activities and function more effectively. Follow these instructions at home: Medicines  Take over-the-counter and prescription medicines only as told by your health care provider. Talk with your health care provider  about the possible side effects of your medicines and   how to manage them. Alcohol use Do not drink alcohol if: Your health care provider tells you not to drink. You are pregnant, may be pregnant, or are planning to become pregnant. If you drink alcohol: Limit how much you use to: 0-1 drink a day for women. 0-2 drinks a day for men. Know how much alcohol is in your drink. In the [place] one drink equals one 12 oz bottle of beer (355 mL), one 5 oz glass of wine (148 mL), or one 1 oz glass of hard liquor (44 mL). Lifestyle  Do not use illegal drugs. Get enough sleep. Eat a healthy diet. Exercise regularly. Exercise can help to reduce stress and anxiety. General instructions Learn as much as you can about adult ADHD, and work closely with your health care providers to find the treatments that work best for you. Follow the same schedule each day. Use reminder devices like notes, calendars, and phone apps to stay on time and organized. Keep all follow-up visits. Your health care provider will need to monitor your condition and adjust your treatment over time. Where to find more information A health care provider may be able to recommend resources that are available online or over the phone. You could start with: Attention Deficit Disorder Association (ADDA): add.org National Institute of Mental Health (NIMH): nimh.nih.gov Contact a health care provider if: Your symptoms continue to cause problems. You have side effects from your medicine, such as: Repeated muscle twitches, coughing, or speech outbursts. Sleep problems. Loss of appetite. Dizziness. Unusually fast heartbeat. Stomach pains. Headaches. You are struggling with anxiety, depression, or substance abuse. Get help right away if: You have a severe reaction to a medicine. This symptom may be an emergency. Get help right away. Call 911. Do not wait to see if the symptom will go away. Do not drive yourself to the hospital. Take  one of these steps if you feel like you may hurt yourself or others, or have thoughts about taking your own life: Go to your nearest emergency room. Call 911. Call the National Suicide Prevention Lifeline at 1-800-273-8255 or 988. This is open 24 hours a day Text the Crisis Text Line at 741741. Summary ADHD is a mental health disorder that starts during childhood and often continues into your adult years. The exact cause of ADHD is not known. Most experts believe genetics and environmental factors contribute to ADHD. There is no cure for ADHD, but treatment with medicine, cognitive behavioral therapy, or behavioral management can help you manage your condition. This information is not intended to replace advice given to you by your health care provider. Make sure you discuss any questions you have with your health care provider. Document Revised: 09/18/2021 Document Reviewed: 09/18/2021 Elsevier Patient Education  2023 Elsevier Inc.  

## 2022-05-19 ENCOUNTER — Ambulatory Visit: Payer: Medicaid Other | Admitting: Nurse Practitioner

## 2022-05-21 ENCOUNTER — Ambulatory Visit (INDEPENDENT_AMBULATORY_CARE_PROVIDER_SITE_OTHER): Payer: Medicaid Other | Admitting: Nurse Practitioner

## 2022-05-21 ENCOUNTER — Encounter: Payer: Self-pay | Admitting: Nurse Practitioner

## 2022-05-21 VITALS — BP 114/82 | HR 77 | Temp 98.8°F | Ht 64.02 in | Wt 93.5 lb

## 2022-05-21 DIAGNOSIS — F902 Attention-deficit hyperactivity disorder, combined type: Secondary | ICD-10-CM

## 2022-05-21 DIAGNOSIS — F32A Depression, unspecified: Secondary | ICD-10-CM

## 2022-05-21 DIAGNOSIS — F419 Anxiety disorder, unspecified: Secondary | ICD-10-CM

## 2022-05-21 DIAGNOSIS — R21 Rash and other nonspecific skin eruption: Secondary | ICD-10-CM

## 2022-05-21 DIAGNOSIS — E440 Moderate protein-calorie malnutrition: Secondary | ICD-10-CM | POA: Diagnosis not present

## 2022-05-21 MED ORDER — LISDEXAMFETAMINE DIMESYLATE 40 MG PO CAPS: 40.0000 mg | ORAL_CAPSULE | ORAL | 0 refills | Status: DC

## 2022-05-21 MED ORDER — METRONIDAZOLE 1 % EX GEL: Freq: Every day | CUTANEOUS | 4 refills | Status: DC

## 2022-05-21 NOTE — Assessment & Plan Note (Signed)
Chronic, improved with Vyvanse.  Continue Vyvanse 40 MG daily, discussed with her, and monitor concentration.  Controlled substance contract and UDS up to date.  Concern for weight -- recommend she continue to take days off medication.

## 2022-05-21 NOTE — Assessment & Plan Note (Signed)
?  rosacea.  Had recent ANA positive and is scheduled to see rheumatology in the new year, this will be beneficial as has family history of various autoimmune diseases. Start Metrogel for rash and see if benefit.  She is to notify provider if any changes, could consider trial of Doxycycline in future.

## 2022-05-21 NOTE — Progress Notes (Signed)
BP 114/82   Pulse 77   Temp 98.8 F (37.1 C) (Oral)   Ht 5' 4.02" (1.626 m)   Wt 93 lb 8 oz (42.4 kg)   LMP 06/11/2021 (Approximate)   SpO2 99%   BMI 16.04 kg/m    Subjective:    Patient ID: Megan Bentley, female    DOB: 1985-07-06, 36 y.o.   MRN: [id number]  HPI: Megan Bentley is a 36 y.o. female  Chief Complaint  Patient presents with   ADHD   Rash    Still getting rashes on her face and they hurt and burn.    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  Followed by neurology.  Scheduled to see psychiatry for ADHD visit next year. Vyvanse (filled last 04/22/22) and continues on Klonopin filled by neurology, which has been discussed at length with PCP coming off -- last fill 05/09/22.  She reports increased stressors at this time.   She continues in medication lock in program through [place] where it is stating she can only have two providers prescribing controlled substances -- PCP and neurology. Lock in for two years she reports.  ADHD status: stable Satisfied with current therapy: no Medication compliance:  good compliance Controlled substance contract: yes Previous psychiatry evaluation: yes Previous medications: yes Vyvanse   Taking meds on weekends/vacations: yes Work/school performance: good Difficulty sustaining attention/completing tasks: yes Distracted by extraneous stimuli: yes Does not listen when spoken to: no  Fidgets with hands or feet: no Unable to stay in seat: no Blurts out/interrupts others: no ADHD Medication Side Effects: no    Decreased appetite: no    Headache: no    Sleeping disturbance pattern: no    Irritability: no    Rebound effects (worse than baseline) off medication: no    Anxiousness: no    Dizziness: no    Tics: no  RASH Reports overall  to scalp and hands -- has family his of autoimmune issues, ANA returned positive and she is scheduled to see rheumatology on 07/07/22.  Has family history of genetic disorder -- was to see Dr. Bess Harvest, has not scheduled as of yet.    Continues to complain of weight loss, was to have colonoscopy on 05/10/22 with GI but this was canceled by patient.  Last visit with GI was 01/19/22.  Reports reduced appetite at times and not taking supplement drinks.   Duration:  weeks  Location: hands and arms  Itching: yes Burning: no Redness: yes Oozing: no Scaling: no Blisters: no Painful:  occasional Fevers: no Change in detergents/soaps/personal care products: no Recent illness: no Recent travel:no History of same: no Context: fluctuating Alleviating factors: nothing Treatments attempted:nothing Shortness of breath: no  Throat/tongue swelling: no Myalgias/arthralgias: yes  Relevant past medical, surgical, family and social history reviewed and updated as indicated. Interim medical history since our last visit reviewed. Allergies and medications reviewed and updated.  Review of Systems  Constitutional:  Negative for activity change, appetite change, diaphoresis, fatigue and fever.  Respiratory:  Negative for cough, chest tightness and shortness of breath.   Cardiovascular:  Negative for chest pain, palpitations and leg swelling.  Gastrointestinal: Negative.   Neurological: Negative.   Psychiatric/Behavioral:  Positive for decreased concentration. Negative for self-injury, sleep disturbance and  suicidal ideas. The patient is not nervous/anxious.     Per HPI unless specifically indicated above     Objective:    BP 114/82   Pulse 77   Temp 98.8 F (37.1 C) (Oral)   Ht 5' 4.02" (1.626 m)   Wt 93 lb 8 oz (42.4 kg)   LMP 06/11/2021 (Approximate)   SpO2 99%   BMI 16.04 kg/m   Wt Readings from Last 3 Encounters:  05/21/22 93 lb 8 oz (42.4 kg)  03/22/22 97 lb 11.2 oz (44.3 kg)  03/13/22 98  lb (44.5 kg)    Physical Exam Vitals and nursing note reviewed.  Constitutional:      General: She is awake. She is not in acute distress.    Appearance: She is well-developed, well-groomed and underweight. She is not ill-appearing or toxic-appearing.  HENT:     Head: Normocephalic.     Right Ear: Hearing and external ear normal.     Left Ear: Hearing and external ear normal.  Eyes:     General: Lids are normal.        Right eye: No discharge.        Left eye: No discharge.     Extraocular Movements: Extraocular movements intact.     Conjunctiva/sclera: Conjunctivae normal.     Pupils: Pupils are equal, round, and reactive to light.  Neck:     Thyroid: No thyromegaly.     Vascular: No carotid bruit or JVD.  Cardiovascular:     Rate and Rhythm: Normal rate and regular rhythm.     Heart sounds: Normal heart sounds. No murmur heard.    No gallop.  Pulmonary:     Effort: Pulmonary effort is normal.     Breath sounds: Normal breath sounds.  Abdominal:     General: Bowel sounds are normal.     Palpations: Abdomen is soft.  Musculoskeletal:     Cervical back: Normal range of motion and neck supple.     Right lower leg: No edema.     Left lower leg: No edema.  Lymphadenopathy:     Cervical: No cervical adenopathy.  Skin:    General: Skin is warm and dry.     Findings: Rash present. Rash is macular and papular.     Comments: Rosacea like redness to bilateral cheeks and scattered raised small maculopapular areas to neck and lower face.  Areas with crusting.  Neurological:     Mental Status: She is alert and oriented to person, place, and time.     Cranial Nerves: Cranial nerves 2-12 are intact.     Sensory: Sensation is intact.     Motor: Motor function is intact.     Coordination: Coordination is intact.     Gait: Gait is intact.     Deep Tendon Reflexes: Reflexes are normal and symmetric.     Reflex Scores:      Brachioradialis reflexes are 2+ on the right side and 2+ on the  left side.      Patellar reflexes are 2+ on the right side and 2+ on the left side. Psychiatric:        Attention and Perception: Attention normal.        Mood and Affect: Mood normal.        Speech: Speech normal.        Behavior: Behavior is cooperative.        Thought Content: Thought content normal.     Results for orders placed  or performed in visit on 03/25/22  Novel Coronavirus, NAA ([facility])   Specimen: Nasopharyngeal(NP) swabs in vial transport medium  Result Value Ref Range   SARS-CoV-2, NAA Not Detected Not Detected  Veritor Flu A/B Waived  Result Value Ref Range   Influenza A Negative Negative   Influenza B Negative Negative      Assessment & Plan:   Problem List Items Addressed This Visit       Musculoskeletal and Integument   Rash    ?rosacea.  Had recent ANA positive and is scheduled to see rheumatology in the new year, this will be beneficial as has family history of various autoimmune diseases. Start Metrogel for rash and see if benefit.  She is to notify provider if any changes, could consider trial of Doxycycline in future.        Other   ADHD    Chronic, improved with Vyvanse.  Continue Vyvanse 40 MG daily, discussed with her, and monitor concentration.  Controlled substance contract and UDS up to date.  Concern for weight -- recommend she continue to take days off medication.        Anxiety and depression    Chronic, ongoing, followed by neurology.  She is currently taking Klonopin prescribed by neurology, PCP has recommended against this as long term medication, which she is aware of.  Would benefit psychiatry referral in future, is having ADHD testing in new year.      Protein-calorie malnutrition, moderate (HCC) - Primary    Ongoing, underweight, maintaining in 90's.  Recommend supplements at home and monitor weight closely.  Denies any purging/binging or restrictions.  Did not tolerate Mirtazapine in past.  Labs up to date.  Recommend she reschedule  with GI and have colonoscopy to further assess.        Follow up plan: Return in about 3 months (around 08/20/2022) for ADHD.

## 2022-05-21 NOTE — Assessment & Plan Note (Signed)
Chronic, ongoing, followed by neurology.  She is currently taking Klonopin prescribed by neurology, PCP has recommended against this as long term medication, which she is aware of.  Would benefit psychiatry referral in future, is having ADHD testing in new year.

## 2022-05-21 NOTE — Assessment & Plan Note (Signed)
Ongoing, underweight, maintaining in 90's.  Recommend supplements at home and monitor weight closely.  Denies any purging/binging or restrictions.  Did not tolerate Mirtazapine in past.  Labs up to date.  Recommend she reschedule with GI and have colonoscopy to further assess.

## 2022-06-01 ENCOUNTER — Telehealth: Payer: Self-pay | Admitting: Nurse Practitioner

## 2022-06-01 MED ORDER — LISDEXAMFETAMINE DIMESYLATE 40 MG PO CAPS: 40.0000 mg | ORAL_CAPSULE | ORAL | 0 refills | Status: DC

## 2022-06-01 NOTE — Telephone Encounter (Signed)
Patient's normal pharmacy is out of stock and [facility] need [facility]- they called and contacted her to let her know- so far the alternate pharmacy has some in stock.

## 2022-06-01 NOTE — Addendum Note (Signed)
Addended by: Aura Dials T on: 06/01/2022 12:13 PM   Modules accepted: Orders

## 2022-06-01 NOTE — Telephone Encounter (Signed)
Called to let patient know that [name] [place]

## 2022-06-01 NOTE — Telephone Encounter (Signed)
Medication Refill - Medication: lisdexamfetamine (VYVANSE) 40 MG capsule   Patient is out of medication. She has taken the medication in 3 days.   Has the patient contacted their pharmacy? Yes.   Pharmacy advised pt to call PCP   Preferred Pharmacy (with phone number or street name):  [id number] [place], [place] - [address] Phone: [phone]  Fax: [phone]     Has the patient been seen for an appointment in the last year OR does the patient have an upcoming appointment? Yes.    Agent: Please be advised that RX refills may take up to 3 business days. We ask that you follow-up with your pharmacy.

## 2022-06-14 DIAGNOSIS — Z419 Encounter for procedure for purposes other than remedying health state, unspecified: Secondary | ICD-10-CM | POA: Diagnosis not present

## 2022-06-17 DIAGNOSIS — R768 Other specified abnormal immunological findings in serum: Secondary | ICD-10-CM | POA: Diagnosis not present

## 2022-06-17 DIAGNOSIS — N83201 Unspecified ovarian cyst, right side: Secondary | ICD-10-CM | POA: Insufficient documentation

## 2022-06-17 DIAGNOSIS — K5901 Slow transit constipation: Secondary | ICD-10-CM | POA: Diagnosis not present

## 2022-06-23 ENCOUNTER — Ambulatory Visit: Payer: Self-pay | Admitting: *Deleted

## 2022-06-23 DIAGNOSIS — F909 Attention-deficit hyperactivity disorder, unspecified type: Secondary | ICD-10-CM | POA: Diagnosis not present

## 2022-06-23 DIAGNOSIS — R1033 Periumbilical pain: Secondary | ICD-10-CM | POA: Diagnosis not present

## 2022-06-23 DIAGNOSIS — Z87891 Personal history of nicotine dependence: Secondary | ICD-10-CM | POA: Diagnosis not present

## 2022-06-23 DIAGNOSIS — R000 Tachycardia, unspecified: Secondary | ICD-10-CM | POA: Diagnosis not present

## 2022-06-23 DIAGNOSIS — Z882 Allergy status to sulfonamides status: Secondary | ICD-10-CM | POA: Diagnosis not present

## 2022-06-23 DIAGNOSIS — Z87442 Personal history of urinary calculi: Secondary | ICD-10-CM | POA: Diagnosis not present

## 2022-06-23 DIAGNOSIS — F32A Depression, unspecified: Secondary | ICD-10-CM | POA: Diagnosis not present

## 2022-06-23 DIAGNOSIS — R202 Paresthesia of skin: Secondary | ICD-10-CM | POA: Diagnosis not present

## 2022-06-23 DIAGNOSIS — F419 Anxiety disorder, unspecified: Secondary | ICD-10-CM | POA: Diagnosis not present

## 2022-06-23 DIAGNOSIS — N200 Calculus of kidney: Secondary | ICD-10-CM | POA: Diagnosis not present

## 2022-06-23 NOTE — Telephone Encounter (Signed)
  Chief Complaint: chills requesting advise  Symptoms: chills/ shivering, skin burning to touch. Low back pain, urine orange in color no odor. Headache, dizziness, poor po intake. S/p cyst rupture in ovary last week.  Frequency: 5 days ago  Pertinent Negatives: Patient denies chest pain no difficulty breathing denies fever. Disposition: [x] ED /[] Urgent Care (no appt availability in office) / [] Appointment(In office/virtual)/ []  [facility]/ [] Home Care/ [] Refused Recommended Disposition /[] [place] [facility]/ []  Follow-up with PCP Additional Notes:   Recommended ED now. Please  advise      Reason for Disposition  [1] Drinking very little AND [2] dehydration suspected (e.g., no urine > 12 hours, very dry mouth, very lightheaded)  Answer Assessment - Initial Assessment Questions 1. ONSET: "When did the muscle aches or body pains start?"      Chills / sweating /skin burning to touch 5 days ago  2. LOCATION: "What part of your body is hurting?" (e.g., entire body, arms, legs)      Body  3. SEVERITY: "How bad is the pain?" (Scale 1-10; or mild, moderate, severe)   - MILD (1-3): doesn't interfere with normal activities    - MODERATE (4-7): interferes with normal activities or awakens from sleep    - SEVERE (8-10):  excruciating pain, unable to do any normal activities      Na  4. CAUSE: "What do you think is causing the pains?"     Not sure  reports cyst rupture last week 5. FEVER: "Have you been having fever?"     No fever 6. OTHER SYMPTOMS: "Do you have any other symptoms?" (e.g., chest pain, weakness, rash, cold or flu symptoms, weight loss)     Pain low back left side , urine darker orange in color. No odor dizziness headache.  7. PREGNANCY: "Is there any chance you are pregnant?" "When was your last menstrual period?"     na 8. TRAVEL: "Have you traveled out of the country in the last month?" (e.g., travel history, exposures)     na  Protocols used: Muscle Aches and  Body Pain-A-AH

## 2022-06-24 ENCOUNTER — Encounter: Payer: Self-pay | Admitting: Nurse Practitioner

## 2022-06-24 ENCOUNTER — Ambulatory Visit: Payer: Medicaid Other | Admitting: Family Medicine

## 2022-06-24 ENCOUNTER — Encounter: Payer: Self-pay | Admitting: Family Medicine

## 2022-06-24 ENCOUNTER — Telehealth: Payer: Self-pay | Admitting: Nurse Practitioner

## 2022-06-24 VITALS — BP 157/91 | HR 125 | Temp 98.6°F | Ht 64.02 in | Wt 91.6 lb

## 2022-06-24 DIAGNOSIS — R202 Paresthesia of skin: Secondary | ICD-10-CM | POA: Diagnosis not present

## 2022-06-24 DIAGNOSIS — N3001 Acute cystitis with hematuria: Secondary | ICD-10-CM | POA: Diagnosis not present

## 2022-06-24 DIAGNOSIS — R000 Tachycardia, unspecified: Secondary | ICD-10-CM | POA: Diagnosis not present

## 2022-06-24 DIAGNOSIS — R6883 Chills (without fever): Secondary | ICD-10-CM | POA: Diagnosis not present

## 2022-06-24 DIAGNOSIS — R519 Headache, unspecified: Secondary | ICD-10-CM

## 2022-06-24 DIAGNOSIS — R310 Gross hematuria: Secondary | ICD-10-CM | POA: Diagnosis not present

## 2022-06-24 LAB — URINALYSIS, ROUTINE W REFLEX MICROSCOPIC
Glucose, UA: NEGATIVE
Nitrite, UA: NEGATIVE
RBC, UA: NEGATIVE
Specific Gravity, UA: >1.03 — ABNORMAL HIGH (ref 1.005–1.030)
Urobilinogen, Ur: 0.2 mg/dL (ref 0.2–1.0)
pH, UA: 5 (ref 5.0–7.5)

## 2022-06-24 LAB — VERITOR FLU A/B WAIVED
Influenza A: NEGATIVE
Influenza B: NEGATIVE

## 2022-06-24 LAB — MICROSCOPIC EXAMINATION: Bacteria, UA: NONE SEEN

## 2022-06-24 MED ORDER — NITROFURANTOIN MONOHYD MACRO 100 MG PO CAPS: 100.0000 mg | ORAL_CAPSULE | Freq: Two times a day (BID) | ORAL | 0 refills | Status: DC

## 2022-06-24 MED ORDER — KETOROLAC TROMETHAMINE 60 MG/2ML IM SOLN
60.0000 mg | Freq: Once | INTRAMUSCULAR | Status: AC
  Administered 2022-06-24: 60 mg via INTRAMUSCULAR

## 2022-06-24 NOTE — Telephone Encounter (Signed)
Copied from Reader [id number]. Topic: General - Other >> Jun 24, 2022  1:15 PM Ludger Nutting wrote: Patient would like to know if PCP can look at her lab results from [facility]. Patient is scheduled to see PCP on 06/25/22. Patient states the ED couldn't find anything to explain her symptoms. Patient feels like something is wrong. Please advise.

## 2022-06-24 NOTE — Progress Notes (Signed)
BP (!) 157/91   Pulse (!) 125   Temp 98.6 F (37 C) (Oral)   Ht 5' 4.02" (1.626 m)   Wt 91 lb 9.6 oz (41.5 kg)   LMP 06/11/2021 (Approximate)   SpO2 100%   BMI 15.71 kg/m    Subjective:    Patient ID: Megan Bentley, female    DOB: 1985/07/18, 37 y.o.   MRN: [id number]  HPI: Megan Bentley is a 36 y.o. female  Chief Complaint  Patient presents with   Dizziness   Fatigue   Megan Bentley presents today not feeling well. She notes that she has been having chills and sweats for about 6 days. Has been having fatigue and dizziness and feeling like she has tingling and a sandpaper like feeling on her arms for about 3 days. She went to the ER yesterday and was told that it was a kidney stone- but all of them were in the upper pole of her kidneys. She has has not had any fevers. She does note that she has been having headaches and has been having some pain in her belly as well. She has been very anxious about this and notes that she has is not sure what's going on. She has not been able to focus for school based on how she's feeling.   Relevant past medical, surgical, family and social history reviewed and updated as indicated. Interim medical history since our last visit reviewed. Allergies and medications reviewed and updated.  Review of Systems  Constitutional:  Positive for appetite change, chills, diaphoresis and fatigue. Negative for activity change, fever and unexpected weight change.  HENT: Negative.    Respiratory: Negative.    Cardiovascular:  Positive for palpitations. Negative for chest pain and leg swelling.  Gastrointestinal:  Positive for constipation and diarrhea. Negative for abdominal distention, abdominal pain, anal bleeding, blood in stool, nausea, rectal pain and vomiting.  Endocrine: Positive for cold intolerance. Negative for heat intolerance, polydipsia, polyphagia and polyuria.  Genitourinary:  Positive for hematuria. Negative for decreased urine volume, difficulty  urinating, dyspareunia, dysuria, enuresis, flank pain, frequency, genital sores, menstrual problem, pelvic pain, urgency, vaginal bleeding, vaginal discharge and vaginal pain.  Neurological:  Positive for dizziness, light-headedness and numbness. Negative for tremors, seizures, syncope, facial asymmetry, speech difficulty, weakness and headaches.  Psychiatric/Behavioral:  Positive for sleep disturbance. Negative for agitation, behavioral problems, confusion, decreased concentration, dysphoric mood, hallucinations, self-injury and suicidal ideas. The patient is not nervous/anxious and is not hyperactive.     Per HPI unless specifically indicated above     Objective:    BP (!) 157/91   Pulse (!) 125   Temp 98.6 F (37 C) (Oral)   Ht 5' 4.02" (1.626 m)   Wt 91 lb 9.6 oz (41.5 kg)   LMP 06/11/2021 (Approximate)   SpO2 100%   BMI 15.71 kg/m   Wt Readings from Last 3 Encounters:  06/24/22 91 lb 9.6 oz (41.5 kg)  05/21/22 93 lb 8 oz (42.4 kg)  03/22/22 97 lb 11.2 oz (44.3 kg)    Physical Exam Vitals and nursing note reviewed.  Constitutional:      General: She is not in acute distress.    Appearance: Normal appearance. She is not ill-appearing, toxic-appearing or diaphoretic.  HENT:     Head: Normocephalic and atraumatic.     Right Ear: External ear normal.     Left Ear: External ear normal.     Nose: Nose normal.     Mouth/Throat:  Mouth: Mucous membranes are moist.     Pharynx: Oropharynx is clear.  Eyes:     General: No scleral icterus.       Right eye: No discharge.        Left eye: No discharge.     Extraocular Movements: Extraocular movements intact.     Conjunctiva/sclera: Conjunctivae normal.     Pupils: Pupils are equal, round, and reactive to light.  Cardiovascular:     Rate and Rhythm: Normal rate and regular rhythm.     Pulses: Normal pulses.     Heart sounds: Normal heart sounds. No murmur heard.    No friction rub. No gallop.  Pulmonary:     Effort:  Pulmonary effort is normal. No respiratory distress.     Breath sounds: Normal breath sounds. No stridor. No wheezing, rhonchi or rales.  Chest:     Chest wall: No tenderness.  Musculoskeletal:        General: Normal range of motion.     Cervical back: Normal range of motion and neck supple.  Skin:    General: Skin is warm and dry.     Capillary Refill: Capillary refill takes less than 2 seconds.     Coloration: Skin is not jaundiced or pale.     Findings: No bruising, erythema, lesion or rash.  Neurological:     General: No focal deficit present.     Mental Status: She is alert and oriented to person, place, and time. Mental status is at baseline.  Psychiatric:        Mood and Affect: Mood normal.        Behavior: Behavior normal.        Thought Content: Thought content normal.        Judgment: Judgment normal.     Results for orders placed or performed in visit on 06/24/22  Novel Coronavirus, NAA ([facility])   Specimen: Saline  Result Value Ref Range   SARS-CoV-2, NAA Not Detected Not Detected  Urine Culture   Specimen: Urine   UR  Result Value Ref Range   Urine Culture, Routine Final report (A)    Organism ID, Bacteria Comment (A)    Antimicrobial Susceptibility Comment   Microscopic Examination   Urine  Result Value Ref Range   WBC, UA 0-5 0 - 5 /hpf   RBC, Urine 0-2 0 - 2 /hpf   Epithelial Cells (non renal) 0-10 0 - 10 /hpf   Mucus, UA Present (A) Not Estab.   Bacteria, UA None seen None seen/Few  Urinalysis, Routine w reflex microscopic  Result Value Ref Range   Specific Gravity, UA >1.030 (H) 1.005 - 1.030   pH, UA 5.0 5.0 - 7.5   Color, UA Yellow Yellow   Appearance Ur Clear Clear   Leukocytes,UA 1+ (A) Negative   Protein,UA 1+ (A) Negative/Trace   Glucose, UA Negative Negative   Ketones, UA 1+ (A) Negative   RBC, UA Negative Negative   Bilirubin, UA CANCELED    Urobilinogen, Ur 0.2 0.2 - 1.0 mg/dL   Nitrite, UA Negative Negative   Microscopic  Examination See below:   Veritor Flu A/B Waived  Result Value Ref Range   Influenza A Negative Negative   Influenza B Negative Negative  TSH  Result Value Ref Range   TSH 0.423 (L) 0.450 - 4.500 uIU/mL  Lyme Disease Serology w/Reflex  Result Value Ref Range   Lyme Total Antibody EIA Negative Negative  Ehrlichia Antibody Panel  Result Value  Ref Range   E.Chaffeensis ([facility]ME) IgG WILL FOLLOW    E. Chaffeensis ([facility]ME) IgM Titer WILL FOLLOW    HGE IgG Titer WILL FOLLOW    HGE IgM Titer WILL FOLLOW    Result Comment: Comment   Babesia microti, Real-time DNA PCR  Result Value Ref Range   Babesia microti PCR WILL FOLLOW   Spotted Fever Group Antibodies  Result Value Ref Range   Spotted Fever Group IgG <1:64 Neg:<1:64   Spotted Fever Group IgM <1:64 Neg:<1:64   Result Comment Comment       Assessment & Plan:   Problem List Items Addressed This Visit   None Visit Diagnoses     Acute cystitis with hematuria    -  Primary   Will send for culture and treat empirically. Await results.   Relevant Orders   Urine Culture (Completed)   Gross hematuria       Relevant Orders   Urinalysis, Routine w reflex microscopic (Completed)   Chills       ? in part due to UTI- will check other labs. Await results.   Relevant Orders   Veritor Flu A/B Waived (Completed)   Novel Coronavirus, NAA ([facility]) (Completed)   Rocky mtn spotted fvr abs pnl(IgG+IgM)   Lyme Disease Serology w/Reflex (Completed)   Babesia microti Antibody Panel   Ehrlichia Antibody Panel (Completed)   Babesia microti, Real-time DNA PCR (Completed)   Spotted Fever Group Antibodies (Completed)   Paresthesias       Will check labs. Await results.   Relevant Orders   TSH (Completed)   Tachycardia       Improved after rest, but still running high, may want to consider beta blocker in the future.   Relevant Orders   EKG 12-Lead (Completed)   Acute nonintractable headache, unspecified headache type       Toradol shot given  today.   Relevant Medications   ketorolac (TORADOL) injection 60 mg (Completed)        Follow up plan: Return 1-2 weeks with [name].

## 2022-06-24 NOTE — Telephone Encounter (Signed)
Yes she has a follow up with you on 1/26

## 2022-06-25 ENCOUNTER — Ambulatory Visit: Payer: Medicaid Other | Admitting: Nurse Practitioner

## 2022-06-25 ENCOUNTER — Encounter: Payer: Self-pay | Admitting: Nurse Practitioner

## 2022-06-25 ENCOUNTER — Ambulatory Visit: Payer: Self-pay | Admitting: *Deleted

## 2022-06-25 LAB — NOVEL CORONAVIRUS, NAA: SARS-CoV-2, NAA: NOT DETECTED

## 2022-06-25 MED ORDER — TRAMADOL HCL 50 MG PO TABS: 50.0000 mg | ORAL_TABLET | Freq: Three times a day (TID) | ORAL | 0 refills | Status: AC | PRN

## 2022-06-25 NOTE — Telephone Encounter (Signed)
Reason for Disposition  [1] Diffuse (all over) muscle pains in the shoulders, arms, legs, and back AND [2] dark (cola or tea-colored) or red-colored urine  Answer Assessment - Initial Assessment Questions 1. COLOR of URINE: "Describe the color of the urine."  (e.g., tea-colored, pink, red, bloody) "Do you have blood clots in your urine?" (e.g., none, pea, grape, small coin)     Same- orange/yellow 2. ONSET: "When did the bleeding start?"      2 days ago-  4. PAIN with URINATION: "Is there any pain with passing your urine?" If Yes, ask: "How bad is the pain?"  (Scale 1-10; or mild, moderate, severe)    - MILD: Complains slightly about urination hurting.    - MODERATE: Interferes with normal activities.      - SEVERE: Excruciating, unwilling or unable to urinate because of the pain.      Moderate- spasms with full bladder 5. FEVER: "Do you have a fever?" If Yes, ask: "What is your temperature, how was it measured, and when did it start?"     no  7. OTHER SYMPTOMS: "Do you have any other symptoms?" (e.g., back/flank pain, abdomen pain, vomiting)     Back/flank pain, abdominal pain- today, palpitations, arm tingling  Protocols used: Urine - Blood In-A-AH

## 2022-06-25 NOTE — Telephone Encounter (Signed)
  Chief Complaint: blood in urine Symptoms: abdominal pain, blood in urine, palpitations, arm tingling, decreased appetite Frequency: patient has been to ED and in office yesterday- patient reports increased symptoms  Pertinent Negatives: Patient denies fever,vomiting Disposition: [x] ED /[] Urgent Care (no appt availability in office) / [] Appointment(In office/virtual)/ []  [facility]/ [] Home Care/ [] Refused Recommended Disposition /[] [place] [facility]/ []  Follow-up with PCP Additional Notes: increased symptoms- palpitations- feels like heart beating out of chest,  abdominal pain new- hurts with full bladder

## 2022-06-27 LAB — URINE CULTURE

## 2022-06-28 ENCOUNTER — Ambulatory Visit
Admission: RE | Admit: 2022-06-28 | Discharge: 2022-06-28 | Disposition: A | Discharge status: Home / Self Care | Payer: Medicaid Other | Source: Ambulatory Visit | Attending: Physician Assistant | Admitting: Physician Assistant

## 2022-06-28 ENCOUNTER — Other Ambulatory Visit: Payer: Self-pay

## 2022-06-28 ENCOUNTER — Ambulatory Visit
Admission: RE | Admit: 2022-06-28 | Discharge: 2022-06-28 | Disposition: A | Discharge status: Home / Self Care | Payer: Medicaid Other | Attending: Physician Assistant | Admitting: Physician Assistant

## 2022-06-28 ENCOUNTER — Encounter: Payer: Self-pay | Admitting: Family Medicine

## 2022-06-28 ENCOUNTER — Ambulatory Visit (INDEPENDENT_AMBULATORY_CARE_PROVIDER_SITE_OTHER): Payer: Medicaid Other | Admitting: Physician Assistant

## 2022-06-28 ENCOUNTER — Encounter: Payer: Self-pay | Admitting: Physician Assistant

## 2022-06-28 VITALS — BP 118/84 | HR 103 | Ht 64.0 in | Wt 86.0 lb

## 2022-06-28 DIAGNOSIS — R109 Unspecified abdominal pain: Secondary | ICD-10-CM | POA: Diagnosis not present

## 2022-06-28 DIAGNOSIS — Z87442 Personal history of urinary calculi: Secondary | ICD-10-CM

## 2022-06-28 DIAGNOSIS — N200 Calculus of kidney: Secondary | ICD-10-CM

## 2022-06-28 DIAGNOSIS — R3129 Other microscopic hematuria: Secondary | ICD-10-CM | POA: Diagnosis not present

## 2022-06-28 LAB — URINALYSIS, COMPLETE
Bilirubin, UA: NEGATIVE
Glucose, UA: NEGATIVE
Nitrite, UA: NEGATIVE
Protein,UA: NEGATIVE
Specific Gravity, UA: 1.02 (ref 1.005–1.030)
Urobilinogen, Ur: 0.2 mg/dL (ref 0.2–1.0)
pH, UA: 6.5 (ref 5.0–7.5)

## 2022-06-28 LAB — EHRLICHIA ANTIBODY PANEL
E. Chaffeensis (HME) IgM Titer: NEGATIVE
E.Chaffeensis (HME) IgG: NEGATIVE
HGE IgG Titer: NEGATIVE
HGE IgM Titer: NEGATIVE

## 2022-06-28 LAB — SPOTTED FEVER GROUP ANTIBODIES
Spotted Fever Group IgG: <1:64 {titer}
Spotted Fever Group IgM: <1:64 {titer}

## 2022-06-28 LAB — MICROSCOPIC EXAMINATION: Epithelial Cells (non renal): >10 /hpf — AB (ref 0–10)

## 2022-06-28 LAB — LYME DISEASE SEROLOGY W/REFLEX: Lyme Total Antibody EIA: NEGATIVE

## 2022-06-28 LAB — TSH: TSH: 0.423 u[IU]/mL — ABNORMAL LOW (ref 0.450–4.500)

## 2022-06-28 LAB — BABESIA MICROTI, REAL-TIME DNA PCR: Babesia microti PCR: NEGATIVE

## 2022-06-28 NOTE — Progress Notes (Unsigned)
06/28/2022 2:24 PM   Megan Bentley 07-28-1985 [id number]  CC: Chief Complaint  Patient presents with   Nephrolithiasis   HPI: Megan Bentley is a 37 y.o. female with PMH anxiety, depression, nephrolithiasis, positive ANA, and cystocele s/p hysterectomy with anterior and posterior colporrhaphy who presents today for evaluation of possible acute stone episode.   Today she reports an approximate 1 week history of cold sweats and pins-and-needles paresthesias throughout her abdomen and upper extremities.  She reports pain with a full bladder that resolves with voiding.  She has also had some discolored urine, nausea, and constipation.  She admits to some mood changes as well.  She sought care for these symptoms at the [facility] ED 5 days ago.  Her UA was notable for microscopic hematuria and occasional calcium oxalate crystals, though her urine culture finalized with no growth.  CTAP without contrast with bilateral nonobstructing nephrolithiasis.  Evaluation for ureteral stones was limited by lack of intraperitoneal fat.  There was a punctate calcification in the right pelvis favored to be vascular in etiology.  She followed up with her PCP 4 days ago based on her ED visit.  Urine microscopy with no significant findings.  Urine culture finalized with low colony counts of Staph hominis.  KUB today with a stable phlebolith but no evidence of radiopaque ureteral stones.  In-office UA today positive for 1+ ketones, trace intact blood, and trace leukocytes; urine microscopy with 6-10 WBCs/HPF, 3-10 RBCs/HPF, >10 epithelial cells/hpf, and moderate bacteria.  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[place])    Sepsis ([place])     Surgical History: Past Surgical History:  Procedure Laterality Date   ANTERIOR AND POSTERIOR REPAIR N/A 06/26/2021   Procedure: ANTERIOR (CYSTOCELE) AND POSTERIOR REPAIR (RECTOCELE);  Surgeon: Schermerhorn, Gwen Her, MD;  Location: [facility] ORS;  Service: Gynecology;  Laterality: N/A;   BILATERAL SALPINGECTOMY Bilateral 06/26/2021   Procedure: BILATERAL SALPINGECTOMY;  Surgeon: Schermerhorn, Gwen Her, MD;  Location: [facility] ORS;  Service: Gynecology;  Laterality: Bilateral;   CYSTOSCOPY W/ RETROGRADES Bilateral 08/06/2015   Procedure: CYSTOSCOPY WITH RETROGRADE PYELOGRAM;  Surgeon: Hollice Espy, MD;  Location: [facility] ORS;  Service: Urology;  Laterality: Bilateral;   CYSTOSCOPY/URETEROSCOPY/HOLMIUM LASER/STENT PLACEMENT Left 08/06/2015   Procedure: CYSTOSCOPY/URETEROSCOPY/HOLMIUM LASER/STENT PLACEMENT;  Surgeon: Hollice Espy, MD;  Location: [facility] ORS;  Service: Urology;  Laterality: Left;   VAGINAL HYSTERECTOMY N/A 06/26/2021   Procedure: HYSTERECTOMY VAGINAL;  Surgeon: Schermerhorn, Gwen Her, MD;  Location: [facility] ORS;  Service: Gynecology;  Laterality: N/A;   WISDOM TOOTH EXTRACTION      Home Medications:  Allergies as of 06/28/2022       Reactions   Sulfa Antibiotics Anaphylaxis   Molnupiravir Hives, Itching   Amoxicillin Itching, Other (See Comments)   Burning skin   Prednisone Rash        Medication List        Accurate as of June 28, 2022  2:24 PM. If you have any questions, ask your nurse or doctor.  STOP taking these medications    linaclotide 290 MCG Caps capsule Commonly known as: Linzess Stopped by: Debroah Loop, PA-C       TAKE these medications    clonazePAM 0.5 MG tablet Commonly known as: KLONOPIN Take 0.25-0.5 mg by mouth daily as needed.   gabapentin 100 MG capsule Commonly known as: NEURONTIN Take 100 mg by mouth 2 (two) times daily.   lisdexamfetamine 40 MG  capsule Commonly known as: Vyvanse Take 1 capsule (40 mg total) by mouth every morning.   lisdexamfetamine 40 MG capsule Commonly known as: Vyvanse Take 1 capsule (40 mg total) by mouth every morning.   lisdexamfetamine 40 MG capsule Commonly known as: Vyvanse Take 1 capsule (40 mg total) by mouth every morning. Start taking on: July 21, 2022   metroNIDAZOLE 1 % gel Commonly known as: Metrogel Apply topically daily.   nitrofurantoin (macrocrystal-monohydrate) 100 MG capsule Commonly known as: Macrobid Take 1 capsule (100 mg total) by mouth 2 (two) times daily.   ondansetron 4 MG tablet Commonly known as: ZOFRAN TAKE 1 TABLET BY MOUTH EVERY 8 HOURS AS NEEDED FOR NAUSEA AND VOMITING   Sarna lotion Generic drug: camphor-menthol Apply 1 Application topically as needed for itching.   traMADol 50 MG tablet Commonly known as: ULTRAM Take 1 tablet (50 mg total) by mouth every 8 (eight) hours as needed for up to 5 days.        Allergies:  Allergies  Allergen Reactions   Sulfa Antibiotics Anaphylaxis   Molnupiravir Hives and Itching   Amoxicillin Itching and Other (See Comments)    Burning skin   Prednisone Rash    Family History: Family History  Problem Relation Age of Onset   Multiple sclerosis Other    Sjogren's syndrome Other        Aunt kim   Kidney Stones Other    Anxiety disorder Mother    Multiple sclerosis Mother    Raynaud syndrome Father    Anxiety disorder Father    Diabetes Father    Anxiety disorder Maternal Grandmother    COPD Maternal Grandmother    Heart disease Maternal Grandfather    Multiple sclerosis Sister    Lung disease Paternal Grandmother    Autoimmune disease Paternal Grandmother    Kidney disease Neg Hx    Bladder Cancer Neg Hx     Social History:   reports that she quit smoking about 3 years ago. Her smoking use included cigarettes. She has a 3.30 pack-year smoking history. She has never used smokeless tobacco. She reports  that she does not currently use drugs after having used the following drugs: Marijuana. She reports that she does not drink alcohol.  Physical Exam: BP 118/84   Pulse (!) 103   Ht 5\' 4"  (1.626 m)   Wt 86 lb (39 kg)   LMP 06/11/2021 (Approximate)   BMI 14.76 kg/m   Constitutional:  Alert and oriented, no acute distress, nontoxic appearing HEENT: [place], AT Cardiovascular: No clubbing, cyanosis, or edema Respiratory: Normal respiratory effort, no increased work of breathing Skin: No rashes, bruises or suspicious lesions Neurologic: Grossly intact, no focal deficits, moving all 4 extremities Psychiatric: Normal mood and affect  Laboratory Data: Results for orders placed or performed in visit on 06/28/22  Mycoplasma / ureaplasma culture   Specimen: Genital   UR  Result Value Ref Range   Ureaplasma urealyticum Comment Negative   Mycoplasma hominis Culture Comment Negative  Microscopic Examination   Urine  Result Value Ref  Range   WBC, UA 6-10 (A) 0 - 5 /hpf   RBC, Urine 3-10 (A) 0 - 2 /hpf   Epithelial Cells (non renal) >10 (A) 0 - 10 /hpf   Bacteria, UA Moderate (A) None seen/Few  Urinalysis, Complete  Result Value Ref Range   Specific Gravity, UA 1.020 1.005 - 1.030   pH, UA 6.5 5.0 - 7.5   Color, UA Yellow Yellow   Appearance Ur Clear Clear   Leukocytes,UA Trace (A) Negative   Protein,UA Negative Negative/Trace   Glucose, UA Negative Negative   Ketones, UA 1+ (A) Negative   RBC, UA Trace (A) Negative   Bilirubin, UA Negative Negative   Urobilinogen, Ur 0.2 0.2 - 1.0 mg/dL   Nitrite, UA Negative Negative   Microscopic Examination See below:    Pertinent Imaging: Results for orders placed during the hospital encounter of 06/28/22  Abdomen 1 view (KUB)  Narrative CLINICAL DATA:  Nephrolithiasis  EXAM: ABDOMEN - 1 VIEW  COMPARISON:  03/13/2022  FINDINGS: The bowel gas pattern is normal. No radio-opaque calculi or other significant radiographic abnormality are  seen.  IMPRESSION: Negative.   Electronically Signed By: Layla Maw M.D. On: 06/28/2022 13:31  I personally reviewed the images referenced above and note no evidence of radiopaque ureteral stones.  Assessment & Plan:   1. Flank pain with history of urolithiasis No significant findings on CT stone study performed at OSH 5 days ago after symptom onset, though unfortunately I am unable to review the images myself today.  I am hesitant to pursue an additional CT and expose her to additional radiation today.  Instead we discussed pursuing a stat renal ultrasound with consideration of further imaging if there is indeterminate hydronephrosis.  She is in agreement with this plan. - US RENAL; Future  2. Microscopic hematuria She has had intermittent hematuria over the past week and is s/p hysterectomy, so contamination from menstruation can be ruled out.  Will schedule her for cystoscopy with Dr. Richardo Hanks for further evaluation.  I think her most recent UA with her PCP likely represents contaminated sample, and her UA today again appears contaminated, though will repeat standard and atypical cultures for further evaluation.  Additionally, with her reports of acute onset abdominal pain, GI changes, and mood changes, we will pursue additional urine testing to rule out acute intermittent porphyria, though my suspicion for this is low. - Urinalysis, Complete - Porphobilinogen, random urine - Porphyrins, fractionated, random urine - Creatinine, urine, random - CULTURE, URINE COMPREHENSIVE - Mycoplasma / ureaplasma culture   Return in about 2 weeks (around 07/12/2022) for Cysto with Dr. Richardo Hanks, Will call with results.  Carman Ching, PA-C  [facility] [address] [place], [place] [address] [phone]

## 2022-06-29 ENCOUNTER — Encounter: Payer: Self-pay | Admitting: Family Medicine

## 2022-06-29 LAB — CREATININE, URINE, RANDOM: Creatinine, Urine: 116.2 mg/dL

## 2022-06-30 ENCOUNTER — Emergency Department
Admission: EM | Admit: 2022-06-30 | Discharge: 2022-06-30 | Disposition: A | Discharge status: Home / Self Care | Payer: Medicaid Other | Attending: Emergency Medicine | Admitting: Emergency Medicine

## 2022-06-30 ENCOUNTER — Emergency Department: Payer: Medicaid Other

## 2022-06-30 ENCOUNTER — Other Ambulatory Visit: Payer: Self-pay

## 2022-06-30 DIAGNOSIS — R300 Dysuria: Secondary | ICD-10-CM | POA: Diagnosis not present

## 2022-06-30 DIAGNOSIS — R1032 Left lower quadrant pain: Secondary | ICD-10-CM | POA: Diagnosis not present

## 2022-06-30 DIAGNOSIS — R319 Hematuria, unspecified: Secondary | ICD-10-CM | POA: Insufficient documentation

## 2022-06-30 DIAGNOSIS — R519 Headache, unspecified: Secondary | ICD-10-CM | POA: Diagnosis not present

## 2022-06-30 DIAGNOSIS — N200 Calculus of kidney: Secondary | ICD-10-CM | POA: Diagnosis not present

## 2022-06-30 DIAGNOSIS — R630 Anorexia: Secondary | ICD-10-CM | POA: Diagnosis not present

## 2022-06-30 DIAGNOSIS — R1030 Lower abdominal pain, unspecified: Secondary | ICD-10-CM

## 2022-06-30 LAB — CBC
HCT: 42.7 % (ref 36.0–46.0)
Hemoglobin: 14.1 g/dL (ref 12.0–15.0)
MCH: 32 pg (ref 26.0–34.0)
MCHC: 33 g/dL (ref 30.0–36.0)
MCV: 96.8 fL (ref 80.0–100.0)
Platelets: 313 10*3/uL (ref 150–400)
RBC: 4.41 MIL/uL (ref 3.87–5.11)
RDW: 13 % (ref 11.5–15.5)
WBC: 8.5 10*3/uL (ref 4.0–10.5)
nRBC: 0 % (ref 0.0–0.2)

## 2022-06-30 LAB — URINALYSIS, ROUTINE W REFLEX MICROSCOPIC
Bilirubin Urine: NEGATIVE
Glucose, UA: NEGATIVE mg/dL
Hgb urine dipstick: NEGATIVE
Ketones, ur: NEGATIVE mg/dL
Nitrite: NEGATIVE
Protein, ur: 30 mg/dL — AB
Specific Gravity, Urine: 1.026 (ref 1.005–1.030)
pH: 5 (ref 5.0–8.0)

## 2022-06-30 LAB — COMPREHENSIVE METABOLIC PANEL
ALT: 12 U/L (ref 0–44)
AST: 13 U/L — ABNORMAL LOW (ref 15–41)
Albumin: 5 g/dL (ref 3.5–5.0)
Alkaline Phosphatase: 49 U/L (ref 38–126)
Anion gap: 10 (ref 5–15)
BUN: 15 mg/dL (ref 6–20)
CO2: 25 mmol/L (ref 22–32)
Calcium: 9.6 mg/dL (ref 8.9–10.3)
Chloride: 103 mmol/L (ref 98–111)
Creatinine, Ser: 0.74 mg/dL (ref 0.44–1.00)
GFR, Estimated: >60 mL/min (ref >60)
Glucose, Bld: 114 mg/dL — ABNORMAL HIGH (ref 70–99)
Potassium: 3.7 mmol/L (ref 3.5–5.1)
Sodium: 138 mmol/L (ref 135–145)
Total Bilirubin: 0.9 mg/dL (ref 0.3–1.2)
Total Protein: 7.8 g/dL (ref 6.5–8.1)

## 2022-06-30 LAB — CULTURE, URINE COMPREHENSIVE

## 2022-06-30 LAB — MAGNESIUM: Magnesium: 2 mg/dL (ref 1.7–2.4)

## 2022-06-30 LAB — TSH: TSH: 0.962 u[IU]/mL (ref 0.350–4.500)

## 2022-06-30 LAB — PORPHOBILINOGEN, RANDOM URINE: Porphobilinogen, Rand Ur: 0.7 mg/L (ref 0.0–2.0)

## 2022-06-30 LAB — T4, FREE: Free T4: 0.75 ng/dL (ref 0.61–1.12)

## 2022-06-30 LAB — LIPASE, BLOOD: Lipase: 61 U/L — ABNORMAL HIGH (ref 11–51)

## 2022-06-30 MED ORDER — SODIUM CHLORIDE 0.9 % IV BOLUS
500.0000 mL | Freq: Once | INTRAVENOUS | Status: AC
  Administered 2022-06-30: 500 mL via INTRAVENOUS

## 2022-06-30 MED ORDER — PROCHLORPERAZINE EDISYLATE 10 MG/2ML IJ SOLN
10.0000 mg | Freq: Once | INTRAMUSCULAR | Status: AC
  Administered 2022-06-30: 10 mg via INTRAVENOUS
  Filled 2022-06-30: qty 2

## 2022-06-30 MED ORDER — PANTOPRAZOLE SODIUM 20 MG PO TBEC: 20.0000 mg | DELAYED_RELEASE_TABLET | Freq: Every day | ORAL | 1 refills | Status: DC

## 2022-06-30 MED ORDER — LACTATED RINGERS IV BOLUS: 500.0000 mL | Freq: Once | INTRAVENOUS | Status: DC

## 2022-06-30 MED ORDER — IOHEXOL 300 MG/ML  SOLN
80.0000 mL | Freq: Once | INTRAMUSCULAR | Status: AC | PRN
  Administered 2022-06-30: 80 mL via INTRAVENOUS

## 2022-06-30 MED ORDER — DIPHENHYDRAMINE HCL 50 MG/ML IJ SOLN
25.0000 mg | Freq: Once | INTRAMUSCULAR | Status: AC
  Administered 2022-06-30: 25 mg via INTRAVENOUS
  Filled 2022-06-30: qty 1

## 2022-06-30 MED ORDER — PROCHLORPERAZINE MALEATE 10 MG PO TABS: 10.0000 mg | ORAL_TABLET | Freq: Four times a day (QID) | ORAL | 0 refills | Status: DC | PRN

## 2022-06-30 NOTE — ED Notes (Signed)
Vitals obtained and were stable. Pt states she is still having abd pain and that "no one has done anything about it since I have been here". Writer offered to go talk to the doctor about this and to bring him to the room since the pt appears angry and not satisfied with her care here in the ED. "No don't get him, he's not going to do anything about this anyways". Writer reviewed labs and imaging, nothing acute showing. Pt knows to follow up outpatient and to always come back if her symptoms get worse or don't improve with medication. "I'm not coming back to this place, you guys don't care". IV d/c'd and pt left. Dr. Charna Archer aware of this and stated that she did not act this way when he was in the room.

## 2022-06-30 NOTE — ED Triage Notes (Signed)
Pt to ED via POV from home. Pt reports abdominal pain, back pain and blood in her urine for 9 days. Pt has been seen at [facility] and PCP. Pt reports they found calcifications in the right pelvis. Pt referred to ER by PCP. Pt also reports + ANA screen. Pt states she has had reports 10lb weight loss in 7 days.

## 2022-06-30 NOTE — ED Notes (Signed)
Pt to ED see triage note for abdominal pain, back pain and blood in urine since 9 days, seen at [facility] and PCP, states was told has calcification to L kidney and + ANA screen and that blood pressure suddenly became elevated also, about 1-2 weeks ago. States "I know something's wrong". Appears pale, lips dry. Husband and toddler at bedside.

## 2022-06-30 NOTE — ED Notes (Signed)
RN assessment limited because pt did not want to talk.

## 2022-06-30 NOTE — ED Notes (Signed)
Patient transported to CT 

## 2022-06-30 NOTE — ED Provider Notes (Signed)
[facility] Provider Note    Event Date/Time   First MD Initiated Contact with Patient 06/30/22 [phone]     (approximate)   History   Chief Complaint Abdominal Pain   HPI  Megan Bentley is a 37 y.o. female with past medical history of migraines, Raynaud's disease, and seizures who presents to the ED complaining of abdominal pain.  Patient reports that she has had approximately 10 days of constant pain in her suprapubic area and left lower quadrant, which she describes as a burning.  This has been associated with some hematuria and she reports increased burning sensation when she goes to urinate.  She has been feeling nauseous and also reports very poor appetite with recent weight loss during the course of her symptoms.  She has not had any fevers and denies any flank pain, her bowel movements have been normal.  She does also report increasing headache during this time, which has now become severe and described as the worst headache of her life.  She denies any neck stiffness and has not had any numbness or weakness.  She was seen in the [facility] ED for this problem, CT renal protocol was unremarkable at that time.  She has since followed up with urology for hematuria and has cystoscopy scheduled later this month.  She presents today for worsening symptoms.     Physical Exam   Triage Vital Signs: ED Triage Vitals [06/30/22 0911]  Enc Vitals Group     BP (!) 160/137     Pulse Rate 87     Resp 18     Temp 98.1 F (36.7 C)     Temp Source Oral     SpO2 100 %     Weight      Height      Head Circumference      Peak Flow      Pain Score 10     Pain Loc      Pain Edu?      Excl. in GC?     Most recent vital signs: Vitals:   06/30/22 0911 06/30/22 1042  BP: (!) 160/137 120/77  Pulse: 87 83  Resp: 18 20  Temp: 98.1 F (36.7 C)   SpO2: 100% 98%    Constitutional: Alert and oriented. Eyes: Conjunctivae are normal. Head: Atraumatic. Nose: No  congestion/rhinnorhea. Mouth/Throat: Mucous membranes are dry.  Cardiovascular: Normal rate, regular rhythm. Grossly normal heart sounds.  2+ radial pulses bilaterally. Respiratory: Normal respiratory effort.  No retractions. Lungs CTAB. Gastrointestinal: Soft and tender to palpation in the left lower quadrant and suprapubic area with no rebound or guarding. No distention. Musculoskeletal: No lower extremity tenderness nor edema.  Neurologic:  Normal speech and language. No gross focal neurologic deficits are appreciated.    ED Results / Procedures / Treatments   Labs (all labs ordered are listed, but only abnormal results are displayed) Labs Reviewed  LIPASE, BLOOD - Abnormal; Notable for the following components:      Result Value   Lipase 61 (*)    All other components within normal limits  COMPREHENSIVE METABOLIC PANEL - Abnormal; Notable for the following components:   Glucose, Bld 114 (*)    AST 13 (*)    All other components within normal limits  URINALYSIS, ROUTINE W REFLEX MICROSCOPIC - Abnormal; Notable for the following components:   Color, Urine YELLOW (*)    APPearance HAZY (*)    Protein, ur 30 (*)  Leukocytes,Ua TRACE (*)    Bacteria, UA RARE (*)    All other components within normal limits  CBC  MAGNESIUM  TSH  T4, FREE  POC URINE PREG, ED   RADIOLOGY CT head reviewed and interpreted by me with no hemorrhage or midline shift.  CT abdomen/pelvis reviewed and interpreted by me with no inflammatory changes, focal fluid collections, or dilated bowel loops.  PROCEDURES:  Critical Care performed: No  Procedures   MEDICATIONS ORDERED IN ED: Medications  prochlorperazine (COMPAZINE) injection 10 mg (10 mg Intravenous Given 06/30/22 1034)  diphenhydrAMINE (BENADRYL) injection 25 mg (25 mg Intravenous Given 06/30/22 1033)  sodium chloride 0.9 % bolus 500 mL (500 mLs Intravenous New Bag/Given 06/30/22 1030)  iohexol (OMNIPAQUE) 300 MG/ML solution 80 mL (80 mLs  Intravenous Contrast Given 06/30/22 1057)     IMPRESSION / MDM / ASSESSMENT AND PLAN / ED COURSE  I reviewed the triage vital signs and the nursing notes.                              37 y.o. female with past medical history of seizures, Raynaud's disease, and migraines who presents to the ED complaining of increasing suprapubic and left lower quadrant abdominal pain affecting her appetite and associated with burning when she urinates as well as diffuse headache for the past week to 10 days.  Patient's presentation is most consistent with acute presentation with potential threat to life or bodily function.  Differential diagnosis includes, but is not limited to, SAH, meningitis, migraine headache, tension headache, dehydration, electrolyte abnormality, AKI, UTI, kidney stone, diverticulitis, hypothyroidism, gastritis, GERD.  Patient nontoxic-appearing and in no acute distress, vitals remarkable for elevated blood pressure but otherwise reassuring.  Labs show no significant anemia, leukocytosis, electrolyte abnormality, or AKI.  She does have very mild elevation in her lipase but no epigastric tenderness on exam, all of her pain seems to be in her suprapubic area and left lower quadrant.  Low suspicion for pancreatitis at this time, will check CT of her abdomen/pelvis with IV contrast for diverticulitis or other explanation for her pain.  Given her headache has also become severe, will also check CT head, although no focal neurologic deficits and no findings concerning for meningitis.  TSH previously noted to be borderline low, will repeat TSH as well as free T4 to evaluate for hyperthyroidism.  Plan to treat symptomatically with IV Compazine and Benadryl, reassess following imaging.  CT imaging is reassuring, CT head negative for acute process, CT abdomen/pelvis remarkable only for nonobstructing right kidney stone.  Additional labs are reassuring, TSH and free T4 are unremarkable, magnesium within  normal limits.  Patient with no vomiting here in the ED and with reassuring workup is appropriate for discharge home with outpatient follow-up.  She has cystoscopy scheduled for later this month for her hematuria, was also provided with referral to establish care with GI for consideration of endoscopy.  We will start her on a PPI and she was prescribed Compazine for use as needed.  She was counseled to return to the ED for new or worsening symptoms, patient agrees with plan.      FINAL CLINICAL IMPRESSION(S) / ED DIAGNOSES   Final diagnoses:  Lower abdominal pain  Decreased appetite  Acute nonintractable headache, unspecified headache type     Rx / DC Orders   ED Discharge Orders          Ordered  pantoprazole (PROTONIX) 20 MG tablet  Daily        06/30/22 1200    prochlorperazine (COMPAZINE) 10 MG tablet  Every 6 hours PRN        06/30/22 1200             Note:  This document was prepared using Dragon voice recognition software and may include unintentional dictation errors.   Blake Divine, MD 06/30/22 [phone]

## 2022-07-01 ENCOUNTER — Other Ambulatory Visit: Payer: Self-pay | Admitting: Physician Assistant

## 2022-07-01 LAB — PORPHYRINS, FRACTIONATED, RANDOM URINE
Coproporphyrin (CP) I: 29 ug/L — ABNORMAL HIGH (ref 0–15)
Coproporphyrin (CP) III: 133 ug/L — ABNORMAL HIGH (ref 0–49)
Heptacarboxyl (7-CP): 7 ug/L — ABNORMAL HIGH (ref 0–2)
Hexacarboxyl (6-CP): 2 ug/L — ABNORMAL HIGH (ref 0–1)
Pentacarboxyl (5-CP): 2 ug/L (ref 0–2)
Uroporphyrins (UP): 9 ug/L (ref 0–20)

## 2022-07-02 ENCOUNTER — Telehealth: Payer: Self-pay | Admitting: *Deleted

## 2022-07-02 NOTE — Telephone Encounter (Signed)
Nurse placed call to patient to review appointment details for upcoming new consultation visit. Nurse clarified appointment details for patient and verified patient knows location of clinic. Patient denies any further questions or concerns.

## 2022-07-03 ENCOUNTER — Encounter: Payer: Self-pay | Admitting: Nurse Practitioner

## 2022-07-04 NOTE — Patient Instructions (Signed)

## 2022-07-05 ENCOUNTER — Other Ambulatory Visit: Payer: Self-pay | Admitting: Nurse Practitioner

## 2022-07-05 ENCOUNTER — Inpatient Hospital Stay: Payer: Medicaid Other | Attending: Internal Medicine | Admitting: Internal Medicine

## 2022-07-05 ENCOUNTER — Encounter: Payer: Self-pay | Admitting: Internal Medicine

## 2022-07-05 ENCOUNTER — Inpatient Hospital Stay: Payer: Medicaid Other

## 2022-07-05 VITALS — BP 115/68 | HR 80 | Temp 98.1°F | Resp 16 | Wt 95.4 lb

## 2022-07-05 DIAGNOSIS — Z8759 Personal history of other complications of pregnancy, childbirth and the puerperium: Secondary | ICD-10-CM | POA: Diagnosis not present

## 2022-07-05 DIAGNOSIS — R5383 Other fatigue: Secondary | ICD-10-CM | POA: Insufficient documentation

## 2022-07-05 DIAGNOSIS — R109 Unspecified abdominal pain: Secondary | ICD-10-CM | POA: Insufficient documentation

## 2022-07-05 DIAGNOSIS — N3289 Other specified disorders of bladder: Secondary | ICD-10-CM | POA: Insufficient documentation

## 2022-07-05 DIAGNOSIS — Z888 Allergy status to other drugs, medicaments and biological substances status: Secondary | ICD-10-CM | POA: Insufficient documentation

## 2022-07-05 DIAGNOSIS — Z880 Allergy status to penicillin: Secondary | ICD-10-CM | POA: Insufficient documentation

## 2022-07-05 DIAGNOSIS — F419 Anxiety disorder, unspecified: Secondary | ICD-10-CM | POA: Insufficient documentation

## 2022-07-05 DIAGNOSIS — R319 Hematuria, unspecified: Secondary | ICD-10-CM | POA: Insufficient documentation

## 2022-07-05 DIAGNOSIS — M2550 Pain in unspecified joint: Secondary | ICD-10-CM | POA: Diagnosis not present

## 2022-07-05 DIAGNOSIS — F17210 Nicotine dependence, cigarettes, uncomplicated: Secondary | ICD-10-CM | POA: Insufficient documentation

## 2022-07-05 DIAGNOSIS — Z87442 Personal history of urinary calculi: Secondary | ICD-10-CM | POA: Insufficient documentation

## 2022-07-05 DIAGNOSIS — F909 Attention-deficit hyperactivity disorder, unspecified type: Secondary | ICD-10-CM | POA: Insufficient documentation

## 2022-07-05 DIAGNOSIS — Z79899 Other long term (current) drug therapy: Secondary | ICD-10-CM | POA: Insufficient documentation

## 2022-07-05 DIAGNOSIS — Z882 Allergy status to sulfonamides status: Secondary | ICD-10-CM | POA: Insufficient documentation

## 2022-07-05 DIAGNOSIS — R21 Rash and other nonspecific skin eruption: Secondary | ICD-10-CM | POA: Diagnosis not present

## 2022-07-05 DIAGNOSIS — D75839 Thrombocytosis, unspecified: Secondary | ICD-10-CM | POA: Insufficient documentation

## 2022-07-05 DIAGNOSIS — R61 Generalized hyperhidrosis: Secondary | ICD-10-CM | POA: Diagnosis not present

## 2022-07-05 DIAGNOSIS — Z9079 Acquired absence of other genital organ(s): Secondary | ICD-10-CM | POA: Diagnosis not present

## 2022-07-05 DIAGNOSIS — F32A Depression, unspecified: Secondary | ICD-10-CM | POA: Diagnosis not present

## 2022-07-05 DIAGNOSIS — N200 Calculus of kidney: Secondary | ICD-10-CM | POA: Diagnosis not present

## 2022-07-05 DIAGNOSIS — Z90710 Acquired absence of both cervix and uterus: Secondary | ICD-10-CM | POA: Diagnosis not present

## 2022-07-05 DIAGNOSIS — D509 Iron deficiency anemia, unspecified: Secondary | ICD-10-CM | POA: Diagnosis not present

## 2022-07-05 LAB — MYCOPLASMA / UREAPLASMA CULTURE
Mycoplasma hominis Culture: NEGATIVE
Ureaplasma urealyticum: NEGATIVE

## 2022-07-05 MED ORDER — TRAMADOL HCL 50 MG PO TABS: 50.0000 mg | ORAL_TABLET | Freq: Two times a day (BID) | ORAL | 0 refills | Status: AC | PRN

## 2022-07-05 MED ORDER — LISDEXAMFETAMINE DIMESYLATE 40 MG PO CAPS: 40.0000 mg | ORAL_CAPSULE | ORAL | 0 refills | Status: DC

## 2022-07-05 NOTE — Assessment & Plan Note (Addendum)
#  Random urine total porphyrin abnormal.  Urine random PBG-negative.  Discussed with the patient that the workup of porphyrias in general is quite complicated.   # Given the nonspecific complaints-I would recommend repeating random & 24 hours- PBG; total porphyrins; and also ALA. Discussed with the patient regarding avoiding sun exposure  # hx of Seizures [last around christmas]/ADHD- on vyvase only when in school.  Currently on gabapentin.  # Tearful/depressed/anxiety- refer to social worker-   Thank you Ms.Vaillancourt for allowing me to participate in the care of your pleasant patient. Please do not hesitate to contact me with questions or concerns in the interim.  # DISPOSITION: # labs ordered today- # follow up TBD- Dr.B

## 2022-07-05 NOTE — Progress Notes (Signed)
[facility]  Initial Assessment   Megan Bentley is a 37 y.o. year old female contacted by phone. Clinical Social Work was referred by medical provider for assessment of psychosocial needs.  Received referral due to patient being anxious and depressed during visit today.  Her symptoms were relieved when she got home.  She is feeling overwhelmed due to her pain.  Patient has received therapy in the past and continues to use some of the coping skills she learned.  SDOH (Social Determinants of Health) assessments performed: Yes SDOH Interventions    Flowsheet Row Patient Outreach Telephone from 03/22/2022 in [place] Most recent reading at 03/22/2022  2:54 PM Office Visit from 03/22/2022 in [place] Most recent reading at 03/22/2022  2:21 PM Office Visit from 02/17/2022 in [place] Most recent reading at 02/17/2022  1:14 PM Office Visit from 01/05/2022 in [place] Most recent reading at 01/05/2022 11:07 AM Office Visit from 07/06/2021 in [place] Most recent reading at 07/06/2021  9:59 AM Office Visit from 05/11/2021 in [place] Most recent reading at 05/11/2021  9:17 AM  SDOH Interventions        Depression Interventions/Treatment  -- Currently on Treatment PHQ2-9 Score <4 Follow-up Not Indicated Medication, Currently on Treatment Currently on Treatment Medication  Stress Interventions Provide Counseling, Offered [employer] -- -- -- -- --       SDOH Screenings   Food Insecurity: No Food Insecurity (04/10/2020)  Housing: [place]  (04/10/2020)  Transportation Needs: No Transportation Needs (04/10/2020)  Alcohol Screen: Low Risk  (04/10/2020)  Depression (PHQ2-9): High Risk (06/24/2022)  Financial Resource Strain: Low Risk  (04/10/2020)  Physical Activity: Inactive (04/10/2020)  Social  Connections: Moderately Isolated (04/10/2020)  Stress: Stress Concern Present (03/22/2022)  Tobacco Use: Medium Risk (07/05/2022)     Distress Screen completed: No    07/05/2022   11:33 AM  ONCBCN DISTRESS SCREENING  Screening Type Initial Screening  Distress experienced in past week (1-10) 10  Emotional problem type Depression;Nervousness/Anxiety;Isolation/feeling alone  Physical Problem type Pain;Nausea/vomiting;Loss of appetitie  Physician notified of physical symptoms Yes  Referral to clinical social work Yes      Family/Social Information:  Housing Arrangement: patient lives with her fiance and children, ages two and nine. Family members/support persons in your life? Family Transportation concerns: no  Employment: Unemployed due to her illness. Income source: Supported by [employer] Financial concerns: Yes, due to illness and/or loss of work during treatment Type of concern: Medical bills Food access concerns: no Religious or spiritual practice: Not known Services Currently in place:  Medicaid  Coping/ Adjustment to diagnosis: Patient understands treatment plan and what happens next? yes Concerns about diagnosis and/or treatment: Losing my job and/or losing income Patient reported stressors: Work/ school, Depression, Anxiety/ nervousness, Adjusting to my illness, and Isolation/ feeling alone Hopes and/or priorities: Pain relief. Patient enjoys time with family/ friends Current coping skills/ strengths: Capable of independent living , Armed forces logistics/support/administrative officer , Scientist, research (life sciences) , General fund of knowledge , Motivation for treatment/growth , and Supportive family/friends     SUMMARY: Current SDOH Barriers:  Mental Health Concerns   Clinical Social Work Clinical Goal(s):  Explore community resource options for unmet needs related to:  Depression   and Stress  Interventions: Discussed common feeling and emotions when being diagnosed with cancer, and the importance  of support during  treatment Informed patient of the support team roles and support services at [facility] Provided CSW contact information and encouraged patient to call with any questions or concerns Referred patient to [facility].   A referral was made in October and patient agreed to another referral.  Mickel Fuchs, social worker at [facility], has worked extensively with patient and has provided several resources.  Patient denied any thoughts of hurting herself or others.   Follow Up Plan: CSW will follow-up with patient by phone  Patient verbalizes understanding of plan: Yes    Margaree Mackintosh, LCSW   Patient is participating in a Managed Medicaid Plan:  Yes

## 2022-07-05 NOTE — Progress Notes (Signed)
[facility] NOTE  Patient Care Team: Venita Lick, NP as PCP - General (Nurse Practitioner) Greg Cutter, LCSW as [place] Management (Licensed Clinical Social Worker) Ethelda Chick as Social Worker  CHIEF COMPLAINTS/PURPOSE OF CONSULTATION: Porphyria.  Oncology History   No history exists.   Uroporphyrins (UP) 0 - 20 ug/L 9  Heptacarboxyl (7-CP) 0 - 2 ug/L 7 High   Hexacarboxyl (6-CP) 0 - 1 ug/L 2 High   Pentacarboxyl (5-CP) 0 - 2 ug/L 2  Coproporphyrin (CP) I 0 - 15 ug/L 29 High   Coproporphyrin (CP) III 0 - 49 ug/L 133 High     HISTORY OF PRESENTING ILLNESS:  Megan Bentley 37 y.o.  female referred to [place] for possible porphyria's.  Patient was previously evaluated in the hematology clinic in 2022 [Dr.Rao] for thrombocytosis.  Workup for MPN negative at that time.  Patient has multiple nonspecific complaints including intermittent flank pain back pain/lower abdominal pain.  Going over the last 2 years.  Admits to facial skin rash.  Denies any photosensitivity.  Denies any skin rashes or else.  Complains of extreme fatigue.  Aching all over.  Hx of positive ANA screen- awaiting to see rheumatology.   Recently patient was noted to have microscopic hematuria which led to further workup for porphyria slightly abnormal.  Patient has been referred to [place] for further evaluation recommendations.  Review of Systems  Constitutional:  Positive for diaphoresis, malaise/fatigue and weight loss. Negative for chills and fever.  HENT:  Negative for nosebleeds and sore throat.   Eyes:  Negative for double vision.  Respiratory:  Negative for cough, hemoptysis, sputum production, shortness of breath and wheezing.   Cardiovascular:  Negative for chest pain, palpitations, orthopnea and leg swelling.  Gastrointestinal:  Positive for abdominal pain. Negative for blood in stool, constipation, diarrhea, heartburn, melena, nausea and vomiting.   Genitourinary:  Positive for hematuria. Negative for dysuria, frequency and urgency.  Musculoskeletal:  Positive for back pain, joint pain and myalgias.  Skin: Negative.  Negative for itching and rash.  Neurological:  Negative for dizziness, tingling, focal weakness, weakness and headaches.  Endo/Heme/Allergies:  Does not bruise/bleed easily.  Psychiatric/Behavioral:  Negative for depression. The patient is not nervous/anxious and does not have insomnia.     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[place])    Sepsis ([place])     SURGICAL HISTORY: Past Surgical History:  Procedure Laterality Date   ANTERIOR AND POSTERIOR REPAIR N/A 06/26/2021   Procedure: ANTERIOR (CYSTOCELE) AND POSTERIOR REPAIR (RECTOCELE);  Surgeon: Schermerhorn, Gwen Her, MD;  Location: [facility];  Service: Gynecology;  Laterality: N/A;   BILATERAL SALPINGECTOMY Bilateral 06/26/2021   Procedure: BILATERAL SALPINGECTOMY;  Surgeon: Schermerhorn, Gwen Her, MD;  Location: [facility];  Service: Gynecology;  Laterality: Bilateral;   CYSTOSCOPY W/ RETROGRADES Bilateral 08/06/2015   Procedure: CYSTOSCOPY WITH RETROGRADE PYELOGRAM;  Surgeon: Hollice Espy, MD;  Location: [facility];  Service: Urology;  Laterality: Bilateral;   CYSTOSCOPY/URETEROSCOPY/HOLMIUM LASER/STENT PLACEMENT Left 08/06/2015  Procedure: CYSTOSCOPY/URETEROSCOPY/HOLMIUM LASER/STENT PLACEMENT;  Surgeon: Hollice Espy, MD;  Location: [facility];  Service: Urology;  Laterality: Left;   VAGINAL HYSTERECTOMY N/A 06/26/2021   Procedure: HYSTERECTOMY VAGINAL;  Surgeon: Schermerhorn, Gwen Her, MD;  Location: [facility];  Service:  Gynecology;  Laterality: N/A;   WISDOM TOOTH EXTRACTION      SOCIAL HISTORY: Social History   Socioeconomic History   Marital status: Divorced    Spouse name: Not on file   Number of children: 2   Years of education: Not on file   Highest education level: Not on file  Occupational History   Not on file  Tobacco Use   Smoking status: Former    Packs/day: 0.30    Years: 11.00    Total pack years: 3.30    Types: Cigarettes    Quit date: 2021    Years since quitting: 3.0   Smokeless tobacco: Never  Vaping Use   Vaping Use: Some days  Substance and Sexual Activity   Alcohol use: No    Alcohol/week: 0.0 standard drinks of alcohol   Drug use: Not Currently    Types: Marijuana    Comment: last used 2021   Sexual activity: Not Currently  Other Topics Concern   Not on file  Social History Narrative   Not on file   Social Determinants of Health   Financial Resource Strain: Low Risk  (04/10/2020)   Overall Financial Resource Strain (CARDIA)    Difficulty of Paying Living Expenses: Not hard at all  Food Insecurity: No Food Insecurity (04/10/2020)   Hunger Vital Sign    Worried About Running Out of Food in the Last Year: Never true    Ran Out of Food in the Last Year: Never true  Transportation Needs: No Transportation Needs (04/10/2020)   PRAPARE - Hydrologist (Medical): No    Lack of Transportation (Non-Medical): No  Physical Activity: Inactive (04/10/2020)   Exercise Vital Sign    Days of Exercise per Week: 0 days    Minutes of Exercise per Session: 0 min  Stress: Stress Concern Present (03/22/2022)   [place]    Feeling of Stress : Rather much  Social Connections: Moderately Isolated (04/10/2020)   Social Connection and Isolation Panel [NHANES]    Frequency of Communication with Friends and Family: Twice a week    Frequency of Social Gatherings with Friends and Family:  Twice a week    Attends Religious Services: Never    Printmaker: No    Attends Music therapist: Never    Marital Status: Living with partner  Human resources officer Violence: Not on file    FAMILY HISTORY: Family History  Problem Relation Age of Onset   Multiple sclerosis Other    Sjogren's syndrome Other        Aunt kim   Kidney Stones Other    Anxiety disorder Mother    Multiple sclerosis Mother    Raynaud syndrome Father    Anxiety disorder Father    Diabetes Father    Anxiety disorder Maternal Grandmother    COPD Maternal Grandmother    Heart disease Maternal Grandfather    Multiple sclerosis Sister    Lung disease Paternal Grandmother    Autoimmune disease Paternal Grandmother    Kidney disease Neg Hx    Bladder Cancer Neg Hx     ALLERGIES:  is allergic to sulfa antibiotics, molnupiravir,  amoxicillin, and prednisone.  MEDICATIONS:  Current Outpatient Medications  Medication Sig Dispense Refill   camphor-menthol (SARNA) lotion Apply 1 Application topically as needed for itching. 222 mL 1   clonazePAM (KLONOPIN) 0.5 MG tablet Take 0.25-0.5 mg by mouth daily as needed.     gabapentin (NEURONTIN) 100 MG capsule Take 100 mg by mouth 2 (two) times daily.     [START ON 07/21/2022] lisdexamfetamine (VYVANSE) 40 MG capsule Take 1 capsule (40 mg total) by mouth every morning. 30 capsule 0   lisdexamfetamine (VYVANSE) 40 MG capsule Take 1 capsule (40 mg total) by mouth every morning. 30 capsule 0   lisdexamfetamine (VYVANSE) 40 MG capsule Take 1 capsule (40 mg total) by mouth every morning. 30 capsule 0   metroNIDAZOLE (METROGEL) 1 % gel Apply topically daily. 55 g 4   ondansetron (ZOFRAN) 4 MG tablet TAKE 1 TABLET BY MOUTH EVERY 8 HOURS AS NEEDED FOR NAUSEA AND VOMITING 45 tablet 0   pantoprazole (PROTONIX) 20 MG tablet Take 1 tablet (20 mg total) by mouth daily. 30 tablet 1   nitrofurantoin, macrocrystal-monohydrate, (MACROBID) 100 MG capsule  Take 1 capsule (100 mg total) by mouth 2 (two) times daily. (Patient not taking: Reported on 07/05/2022) 14 capsule 0   prochlorperazine (COMPAZINE) 10 MG tablet Take 1 tablet (10 mg total) by mouth every 6 (six) hours as needed. (Patient not taking: Reported on 07/05/2022) 12 tablet 0   No current facility-administered medications for this visit.    PHYSICAL EXAMINATION:  Vitals:   07/05/22 1100  BP: 115/68  Pulse: 80  Resp: 16  Temp: 98.1 F (36.7 C)   Filed Weights   07/05/22 1100  Weight: 95 lb 6.4 oz (43.3 kg)    Physical Exam Vitals and nursing note reviewed.  Constitutional:      Comments:     HENT:     Head: Normocephalic and atraumatic.     Mouth/Throat:     Mouth: Mucous membranes are moist.     Pharynx: No oropharyngeal exudate.  Eyes:     Pupils: Pupils are equal, round, and reactive to light.  Cardiovascular:     Rate and Rhythm: Normal rate and regular rhythm.  Pulmonary:     Effort: No respiratory distress.     Breath sounds: Normal breath sounds. No wheezing.  Abdominal:     General: Bowel sounds are normal. There is no distension.     Palpations: Abdomen is soft. There is no mass.     Tenderness: There is no abdominal tenderness. There is no guarding or rebound.  Musculoskeletal:        General: No tenderness. Normal range of motion.     Cervical back: Normal range of motion and neck supple.  Skin:    General: Skin is warm.  Neurological:     Mental Status: She is alert and oriented to person, place, and time.  Psychiatric:        Mood and Affect: Affect normal.        Judgment: Judgment normal.     LABORATORY DATA:  I have reviewed the data as listed Lab Results  Component Value Date   WBC 8.5 06/30/2022   HGB 14.1 06/30/2022   HCT 42.7 06/30/2022   MCV 96.8 06/30/2022   PLT 313 06/30/2022   Recent Labs    03/06/22 1629 03/13/22 1141 03/22/22 1440 06/30/22 0913  NA 140 136 137 138  K 4.2 4.2 3.8 3.7  CL 108 106 101 103  CO2 25  24 23 25   GLUCOSE 116* 104* 87 114*  BUN 15 14 10 15   CREATININE 0.67 0.62 0.76 0.74  CALCIUM 8.7* 8.6* 9.0 9.6  GFRNONAA >60 >60  --  >60  PROT 6.5 6.2* 6.7 7.8  ALBUMIN 4.1 3.7 4.4 5.0  AST 15 15 11  13*  ALT 11 11 8 12   ALKPHOS 45 38 58 49  BILITOT 0.6 1.3* 0.6 0.9    RADIOGRAPHIC STUDIES: I have personally reviewed the radiological images as listed and agreed with the findings in the report. CT Abdomen Pelvis W Contrast  Result Date: 06/30/2022 CLINICAL DATA:  Left lower quadrant abdominal pain EXAM: CT ABDOMEN AND PELVIS WITH CONTRAST TECHNIQUE: Multidetector CT imaging of the abdomen and pelvis was performed using the standard protocol following bolus administration of intravenous contrast. RADIATION DOSE REDUCTION: This exam was performed according to the departmental dose-optimization program which includes automated exposure control, adjustment of the mA and/or kV according to patient size and/or use of iterative reconstruction technique. CONTRAST:  39mL OMNIPAQUE IOHEXOL 300 MG/ML  SOLN COMPARISON:  Renal ultrasound June 28, 2022, CT abdomen and pelvis with contrast March 06, 2022 FINDINGS: Lower chest: Visualized bibasilar lungs clear. Normal heart size no pericardial effusion. Hepatobiliary: No focal liver abnormality is seen. No gallstones, gallbladder wall thickening, or biliary dilatation. Pancreas: Unremarkable. No pancreatic ductal dilatation or surrounding inflammatory changes. Spleen: Normal in size without focal abnormality. Adrenals/Urinary Tract: The adrenal glands are within normal limits. Redemonstrated punctate right renal stone (series 2, image 26). Symmetric enhancement of bilateral kidneys no hydronephrosis. The urinary bladder is within normal limits. Stomach/Bowel: Stomach is within normal limits. Appendix appears normal. No evidence of bowel wall thickening, distention, or inflammatory changes. Vascular/Lymphatic: No significant vascular findings are present. No  enlarged abdominal or pelvic lymph nodes. Reproductive: Uterus and bilateral adnexa are unremarkable. Other: No abdominal wall hernia or abnormality. No abdominopelvic ascites. Musculoskeletal: No acute or significant osseous findings. IMPRESSION: 1. No etiology for left lower quadrant abdominal pain identified. 2. Unchanged punctate nonobstructive right nephrolithiasis. Electronically Signed   By: 07/02/2022 M.D.   On: 06/30/2022 11:18   CT Head Wo Contrast  Result Date: 06/30/2022 CLINICAL DATA:  Headache, sudden, severe EXAM: CT HEAD WITHOUT CONTRAST TECHNIQUE: Contiguous axial images were obtained from the base of the skull through the vertex without intravenous contrast. RADIATION DOSE REDUCTION: This exam was performed according to the departmental dose-optimization program which includes automated exposure control, adjustment of the mA and/or kV according to patient size and/or use of iterative reconstruction technique. COMPARISON:  CT head June 10, 23. FINDINGS: Brain: No evidence of acute infarction, hemorrhage, hydrocephalus, extra-axial collection or mass lesion/mass effect. Vascular: No hyperdense vessel. Skull: No acute fracture. Sinuses/Orbits: No acute finding. Other: No mastoid effusions. IMPRESSION: No evidence of acute intracranial abnormality. Electronically Signed   By: Jacob Moores M.D.   On: 06/30/2022 11:12   07/02/2022 RENAL  Result Date: 06/28/2022 CLINICAL DATA:  Flank/abdominal pain.  History of kidney stones. EXAM: RENAL / URINARY TRACT ULTRASOUND COMPLETE COMPARISON:  CT AP 03/06/2022 FINDINGS: Right Kidney: Renal measurements: 9.9 x 3.0 x 4.6 cm = volume: 109.2 mL. Echogenicity within normal limits. No mass or hydronephrosis visualized. Left Kidney: Renal measurements: 10.7 x 5.0 x 4.1 cm = volume: 114.9 mL. Echogenicity within normal limits. No mass or hydronephrosis visualized. Bladder: Appears normal for degree of bladder distention. Other: None. IMPRESSION: Normal renal  sonogram. Electronically Signed   By: 07/02/2022.D.  On: 06/28/2022 15:16   Abdomen 1 view (KUB)  Result Date: 06/28/2022 CLINICAL DATA:  Nephrolithiasis EXAM: ABDOMEN - 1 VIEW COMPARISON:  03/13/2022 FINDINGS: The bowel gas pattern is normal. No radio-opaque calculi or other significant radiographic abnormality are seen. IMPRESSION: Negative. Electronically Signed   By: Sammie Bench M.D.   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  Urine random PBG-negative.  Discussed with the patient that the workup of porphyrias in general is quite complicated.   # Given the nonspecific complaints-I would recommend repeating random & 24 hours- PBG; total porphyrins; and also ALA. Discussed with the patient regarding avoiding sun exposure  # hx of Seizures [last around christmas]/ADHD- on vyvase only when in school.  Currently on gabapentin.  # Tearful/depressed/anxiety- refer to social worker-   Thank you Ms.Vaillancourt for allowing me to participate in the care of your pleasant patient. Please do not hesitate to contact me with questions or concerns in the interim.  # DISPOSITION: # labs ordered today- # follow up TBD- Dr.B      Cammie Sickle, MD 07/05/2022 1:17 PM

## 2022-07-05 NOTE — Progress Notes (Signed)
Established patient with new area of concerns.  Reports unable to do anything for herself due to pain and fatigue.    All over body pain for past 2 weeks, 8/10 pain scale today.  Patient is tearful in clinic today, referral entered for CSW.

## 2022-07-05 NOTE — Progress Notes (Signed)
Interpreted by me on 06/24/22. NSR at 97bpm, no ST segment changes

## 2022-07-05 NOTE — Addendum Note (Signed)
Addended by: Marnee Guarneri T on: 07/05/2022 05:00 PM   Modules accepted: Orders
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
## 2022-07-07 ENCOUNTER — Telehealth: Payer: Self-pay | Admitting: *Deleted

## 2022-07-07 ENCOUNTER — Encounter: Payer: Self-pay | Admitting: Internal Medicine

## 2022-07-07 ENCOUNTER — Encounter: Payer: Self-pay | Admitting: *Deleted

## 2022-07-07 ENCOUNTER — Ambulatory Visit: Payer: Medicaid Other | Attending: Internal Medicine | Admitting: Internal Medicine

## 2022-07-07 VITALS — BP 132/92 | HR 108 | Temp 102.0°F | Resp 14 | Ht 64.0 in | Wt 91.0 lb

## 2022-07-07 DIAGNOSIS — F419 Anxiety disorder, unspecified: Secondary | ICD-10-CM | POA: Diagnosis not present

## 2022-07-07 DIAGNOSIS — F32A Depression, unspecified: Secondary | ICD-10-CM

## 2022-07-07 DIAGNOSIS — I7300 Raynaud's syndrome without gangrene: Secondary | ICD-10-CM

## 2022-07-07 DIAGNOSIS — E440 Moderate protein-calorie malnutrition: Secondary | ICD-10-CM | POA: Diagnosis not present

## 2022-07-07 DIAGNOSIS — G43109 Migraine with aura, not intractable, without status migrainosus: Secondary | ICD-10-CM

## 2022-07-07 DIAGNOSIS — R768 Other specified abnormal immunological findings in serum: Secondary | ICD-10-CM

## 2022-07-07 LAB — PORPHYRINS, FRACTIONATED, RANDOM URINE
Coproporphyrin (CP) I: 6 ug/L (ref 0–15)
Coproporphyrin (CP) III: 31 ug/L (ref 0–49)
Heptacarboxyl (7-CP): 10 ug/L — ABNORMAL HIGH (ref 0–2)
Hexacarboxyl (6-CP): 7 ug/L — ABNORMAL HIGH (ref 0–1)
Pentacarboxyl (5-CP): <1 ug/L (ref 0–2)
Uroporphyrins (UP): 5 ug/L (ref 0–20)

## 2022-07-07 LAB — MISC LABCORP TEST (SEND OUT): Labcorp test code: 120980

## 2022-07-07 MED ORDER — METHYLPREDNISOLONE 4 MG PO TABS: 8.0000 mg | ORAL_TABLET | Freq: Every day | ORAL | 0 refills | Status: DC

## 2022-07-07 NOTE — Telephone Encounter (Signed)
Is she referring to labs ordered by Dr. B?  She had urine studies with some still pending.

## 2022-07-07 NOTE — Telephone Encounter (Signed)
Patient called upset at some of her results stating ath her Urologist may have been right about a disorder in her urinary system. She states she is not feeling well and wants a return call regarding her results

## 2022-07-08 ENCOUNTER — Other Ambulatory Visit: Payer: Self-pay

## 2022-07-08 ENCOUNTER — Encounter: Payer: Self-pay | Admitting: Internal Medicine

## 2022-07-08 LAB — MISC LABCORP TEST (SEND OUT): Labcorp test code: 3065

## 2022-07-09 ENCOUNTER — Ambulatory Visit: Payer: Medicaid Other | Admitting: Nurse Practitioner

## 2022-07-09 ENCOUNTER — Encounter: Payer: Self-pay | Admitting: Nurse Practitioner

## 2022-07-09 VITALS — BP 114/78 | HR 70 | Temp 98.0°F | Ht 64.02 in | Wt 93.5 lb

## 2022-07-09 DIAGNOSIS — E440 Moderate protein-calorie malnutrition: Secondary | ICD-10-CM | POA: Diagnosis not present

## 2022-07-09 DIAGNOSIS — F419 Anxiety disorder, unspecified: Secondary | ICD-10-CM

## 2022-07-09 DIAGNOSIS — F902 Attention-deficit hyperactivity disorder, combined type: Secondary | ICD-10-CM

## 2022-07-09 DIAGNOSIS — F32A Depression, unspecified: Secondary | ICD-10-CM | POA: Diagnosis not present

## 2022-07-09 DIAGNOSIS — R768 Other specified abnormal immunological findings in serum: Secondary | ICD-10-CM

## 2022-07-09 NOTE — Progress Notes (Signed)
BP 114/78   Pulse 70   Temp 98 F (36.7 C) (Oral)   Ht 5' 4.02" (1.626 m)   Wt 93 lb 8 oz (42.4 kg)   LMP 06/11/2021 (Approximate)   SpO2 98%   BMI 16.04 kg/m    Subjective:    Patient ID: Megan Bentley, female    DOB: [birth year], 37 y.o.   MRN: [id number]  HPI: Megan Bentley is a 37 y.o. 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  Followed by neurology.  Scheduled to see psychiatry for ADHD visit next year. Vyvanse (filled last 07/03/22) and continues on Klonopin filled by neurology, which has been discussed at length with PCP coming off -- last fill 07/02/22.  She reports increased stressors at this time with her chronic health issues.   Continues in medication lock in program through [facility] where it is stating she can only have two providers prescribing controlled substances -- PCP and neurology. Lock in for two years she reports.  ADHD status: stable Satisfied with current therapy: no Medication compliance:  good compliance Controlled substance contract: yes Previous psychiatry evaluation: yes Previous medications: yes Vyvanse   Taking meds on weekends/vacations: yes Work/school performance: good Difficulty sustaining attention/completing tasks: yes Distracted by extraneous stimuli: yes Does not listen when spoken to: no  Fidgets with hands or feet: no Unable to stay in seat: no Blurts out/interrupts others: no ADHD Medication Side Effects: no    Decreased appetite: no    Headache: no    Sleeping disturbance pattern: no    Irritability: no    Rebound effects (worse than baseline) off medication: no    Anxiousness: no    Dizziness: no    Tics: no  MULTIPLE AREAS OF PAIN Saw rheumatology on 07/07/22.  Continues to have headache pain, abdominal pain, back pain -- has seen urology and hematology due to blood in urine, she  is doing further testing with them due to this (porphyria). Joint pain is worse in morning and at night.  In morning is very stiff for 30 minutes + swollen to joints. Currently has Tramadol short burst only to take as needed, filled on 07/05/22 -- have discussed this is short burst only due to not wanting her to take with Vyvanse and Klonopin.  Currently taking Gabapentin 200 MG BID.  Was started on steroid taper by rheumatology -- reports this is causing some nausea. Duration: months Pain: yes Symmetric: yes  4/10 to 7/10 worse at night Quality: dull, aching, and throbbing Frequency: intermittent Context:  fluctuating Aggravating factors: eating (causes burning) Alleviating factors: unknown Relief with NSAIDs?: No NSAIDs Taken Treatments attempted:  Tramadol, Gabapentin  Involved Joints:     Hands: yes bilateral    Wrists: none    Elbows: none    Shoulders: yes bilateral    Back: yes     Hips: yes bilateral    Knees: yes bilateral    Ankles: none    Feet: none  Relevant past medical, surgical, family and social history reviewed and updated as indicated. Interim medical history since our last visit reviewed. Allergies and medications reviewed and updated.  Review of Systems  Constitutional:  Negative for activity change, appetite change, diaphoresis, fatigue and fever.  Respiratory:  Negative for cough, chest tightness and shortness of breath.   Cardiovascular:  Negative for chest pain, palpitations and leg swelling.  Gastrointestinal: Negative.   Neurological: Negative.   Psychiatric/Behavioral:  Positive for decreased concentration. Negative for self-injury, sleep disturbance and suicidal ideas. The patient is not nervous/anxious.    Per HPI unless specifically indicated above     Objective:    BP 114/78   Pulse 70   Temp 98 F (36.7 C) (Oral)   Ht 5' 4.02" (1.626 m)   Wt 93 lb 8 oz (42.4 kg)   LMP 06/11/2021 (Approximate)   SpO2 98%   BMI 16.04 kg/m   Wt Readings  from Last 3 Encounters:  07/09/22 93 lb 8 oz (42.4 kg)  07/07/22 91 lb (41.3 kg)  07/05/22 95 lb 6.4 oz (43.3 kg)    Physical Exam Vitals and nursing note reviewed.  Constitutional:      General: She is awake. She is not in acute distress.    Appearance: She is well-developed, well-groomed and underweight. She is not ill-appearing or toxic-appearing.  HENT:     Head: Normocephalic.     Right Ear: Hearing and external ear normal.     Left Ear: Hearing and external ear normal.  Eyes:     General: Lids are normal.        Right eye: No discharge.        Left eye: No discharge.     Extraocular Movements: Extraocular movements intact.     Conjunctiva/sclera: Conjunctivae normal.     Pupils: Pupils are equal, round, and reactive to light.  Neck:     Thyroid: No thyromegaly.     Vascular: No carotid bruit or JVD.  Cardiovascular:     Rate and Rhythm: Normal rate and regular rhythm.     Heart sounds: Normal heart sounds. No murmur heard.    No gallop.  Pulmonary:     Effort: Pulmonary effort is normal.     Breath sounds: Normal breath sounds.  Abdominal:     General: Bowel sounds are normal.     Palpations: Abdomen is soft.  Musculoskeletal:     Cervical back: Normal range of motion and neck supple.     Right lower leg: No edema.     Left lower leg: No edema.  Lymphadenopathy:     Cervical: No cervical adenopathy.  Skin:    General: Skin is warm and dry.     Findings: No rash.  Neurological:     Mental Status: She is alert and oriented to person, place, and time.     Cranial Nerves: Cranial nerves 2-12 are intact.     Sensory: Sensation is intact.     Motor: Motor function is intact.     Coordination: Coordination is intact.     Gait: Gait is intact.     Deep Tendon Reflexes: Reflexes are normal and symmetric.     Reflex Scores:      Brachioradialis reflexes are 2+ on the right side and 2+ on the left side.      Patellar reflexes are 2+ on the right side and 2+ on the left  side. Psychiatric:        Attention and Perception: Attention normal.        Mood and Affect: Mood normal.        Speech: Speech normal.        Behavior: Behavior is cooperative.        Thought Content: Thought content normal.    Results for orders placed or performed in visit  on 07/07/22  Anti-DNA antibody, double-stranded  Result Value Ref Range   ds DNA Ab <1 IU/mL  C3 and C4  Result Value Ref Range   C3 Complement 95 83 - 193 mg/dL   C4 Complement 19 15 - 57 mg/dL  CK  Result Value Ref Range   Total CK 61 29 - 143 U/L  IgG, IgA, IgM  Result Value Ref Range   Immunoglobulin A 88 47 - 310 mg/dL   IgG (Immunoglobin G), Serum 927 600 - 1,640 mg/dL   IgM, Serum 151 50 - 300 mg/dL  ANA  Result Value Ref Range   Anti Nuclear Antibody (ANA) NEGATIVE NEGATIVE      Assessment & Plan:   Problem List Items Addressed This Visit       Other   ADHD    Chronic, stable.  Continue Vyvanse 40 MG daily, discussed with her, and monitor concentration.  Controlled substance contract and UDS up to date.  Concern for weight -- recommend she continue to take days off medication.        Anxiety and depression - Primary    Chronic, ongoing, followed by neurology.  She is currently taking Klonopin prescribed by neurology, PCP has recommended against this as long term medication, which she is aware of.  Would benefit psychiatry referral in future.      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  Recent notes reviewed.      Positive ANA (antinuclear antibody)    On labs -- continue collaboration with rheumatology, recent note reviewed.      Protein-calorie malnutrition, moderate (HCC)    Ongoing, underweight, maintaining in 90's.  Recommend supplements at home and monitor weight closely.  Denies any purging/binging or restrictions.  Did not tolerate Mirtazapine in past.  Labs up to date.   Continue collaboration with hematology and rheumatology, recent notes reviewed.        Follow up plan: Return for as scheduled March 8th.

## 2022-07-09 NOTE — Assessment & Plan Note (Signed)
Ongoing, underweight, maintaining in 90's.  Recommend supplements at home and monitor weight closely.  Denies any purging/binging or restrictions.  Did not tolerate Mirtazapine in past.  Labs up to date.  Continue collaboration with hematology and rheumatology, recent notes reviewed.

## 2022-07-09 NOTE — Assessment & Plan Note (Signed)
Chronic, stable.  Continue Vyvanse 40 MG daily, discussed with her, and monitor concentration.  Controlled substance contract and UDS up to date.  Concern for weight -- recommend she continue to take days off medication.

## 2022-07-09 NOTE — Assessment & Plan Note (Signed)
On labs -- continue collaboration with rheumatology, recent note reviewed.

## 2022-07-09 NOTE — Assessment & Plan Note (Signed)
Chronic, ongoing, followed by neurology.  She is currently taking Klonopin prescribed by neurology, PCP has recommended against this as long term medication, which she is aware of.  Would benefit psychiatry referral in future.

## 2022-07-09 NOTE — Assessment & Plan Note (Signed)
Ongoing, followed by hematology, continue this collaboration.  Recent notes reviewed.

## 2022-07-13 LAB — MISC LABCORP TEST (SEND OUT): Labcorp test code: 3194

## 2022-07-15 ENCOUNTER — Encounter: Payer: Self-pay | Admitting: Licensed Clinical Social Worker

## 2022-07-15 ENCOUNTER — Encounter: Payer: Medicaid Other | Attending: Psychology | Admitting: Psychology

## 2022-07-15 DIAGNOSIS — Z87898 Personal history of other specified conditions: Secondary | ICD-10-CM

## 2022-07-15 DIAGNOSIS — G894 Chronic pain syndrome: Secondary | ICD-10-CM

## 2022-07-15 DIAGNOSIS — G40909 Epilepsy, unspecified, not intractable, without status epilepticus: Secondary | ICD-10-CM

## 2022-07-15 DIAGNOSIS — G43109 Migraine with aura, not intractable, without status migrainosus: Secondary | ICD-10-CM | POA: Diagnosis not present

## 2022-07-15 DIAGNOSIS — F4312 Post-traumatic stress disorder, chronic: Secondary | ICD-10-CM

## 2022-07-15 DIAGNOSIS — R768 Other specified abnormal immunological findings in serum: Secondary | ICD-10-CM | POA: Diagnosis not present

## 2022-07-15 DIAGNOSIS — F410 Panic disorder [episodic paroxysmal anxiety] without agoraphobia: Secondary | ICD-10-CM | POA: Diagnosis not present

## 2022-07-15 DIAGNOSIS — Z419 Encounter for procedure for purposes other than remedying health state, unspecified: Secondary | ICD-10-CM | POA: Diagnosis not present

## 2022-07-15 DIAGNOSIS — R41840 Attention and concentration deficit: Secondary | ICD-10-CM

## 2022-07-15 LAB — ANA: Anti Nuclear Antibody (ANA): NEGATIVE

## 2022-07-15 LAB — ANTI-DNA ANTIBODY, DOUBLE-STRANDED: ds DNA Ab: <1 IU/mL

## 2022-07-15 LAB — IGG, IGA, IGM
IgG (Immunoglobin G), Serum: 927 mg/dL (ref 600–1640)
IgM, Serum: 151 mg/dL (ref 50–300)
Immunoglobulin A: 88 mg/dL (ref 47–310)

## 2022-07-15 LAB — C3 AND C4
C3 Complement: 95 mg/dL (ref 83–193)
C4 Complement: 19 mg/dL (ref 15–57)

## 2022-07-15 LAB — HISTONE ANTIBODIES, IGG, BLOOD: Histone Antibodies: <1 U (ref <1.0)

## 2022-07-15 LAB — CK: Total CK: 61 U/L (ref 29–143)

## 2022-07-15 NOTE — Progress Notes (Signed)
[place] CSW Progress Note  Clinical Social Worker  attempted to contact patient  to follow-up. Patient's voicemail is full and CSW was unable to leave voicemail.  FA  Adelene Amas, LCSW    Patient is participating in a Managed Medicaid Plan:  Yes

## 2022-07-16 ENCOUNTER — Telehealth: Payer: Self-pay

## 2022-07-16 NOTE — Telephone Encounter (Signed)
PA for Lisdexamfetamine Dimesylate 40MG  capsules has been approved

## 2022-07-21 ENCOUNTER — Encounter: Payer: Self-pay | Admitting: Licensed Clinical Social Worker

## 2022-07-21 ENCOUNTER — Other Ambulatory Visit: Payer: Self-pay | Admitting: Nurse Practitioner

## 2022-07-21 DIAGNOSIS — R5382 Chronic fatigue, unspecified: Secondary | ICD-10-CM | POA: Diagnosis not present

## 2022-07-21 DIAGNOSIS — F410 Panic disorder [episodic paroxysmal anxiety] without agoraphobia: Secondary | ICD-10-CM | POA: Diagnosis not present

## 2022-07-21 DIAGNOSIS — R42 Dizziness and giddiness: Secondary | ICD-10-CM | POA: Diagnosis not present

## 2022-07-21 DIAGNOSIS — R200 Anesthesia of skin: Secondary | ICD-10-CM | POA: Diagnosis not present

## 2022-07-21 DIAGNOSIS — F4310 Post-traumatic stress disorder, unspecified: Secondary | ICD-10-CM | POA: Diagnosis not present

## 2022-07-21 DIAGNOSIS — R630 Anorexia: Secondary | ICD-10-CM | POA: Diagnosis not present

## 2022-07-21 DIAGNOSIS — F411 Generalized anxiety disorder: Secondary | ICD-10-CM | POA: Diagnosis not present

## 2022-07-21 DIAGNOSIS — R55 Syncope and collapse: Secondary | ICD-10-CM | POA: Diagnosis not present

## 2022-07-21 DIAGNOSIS — G43109 Migraine with aura, not intractable, without status migrainosus: Secondary | ICD-10-CM | POA: Diagnosis not present

## 2022-07-21 DIAGNOSIS — R202 Paresthesia of skin: Secondary | ICD-10-CM | POA: Diagnosis not present

## 2022-07-21 NOTE — Progress Notes (Signed)
[place] CSW Progress Note   Clinical Social Worker  attempted to contact patient  to follow-up. Patient's voicemail is full and CSW was unable to leave voicemail   SA  Adelene Amas, LCSW    Patient is participating in a Managed Medicaid Plan:  Yes

## 2022-07-22 ENCOUNTER — Ambulatory Visit (INDEPENDENT_AMBULATORY_CARE_PROVIDER_SITE_OTHER): Payer: Medicaid Other | Admitting: Urology

## 2022-07-22 ENCOUNTER — Encounter: Payer: Self-pay | Admitting: Urology

## 2022-07-22 VITALS — BP 124/85 | HR 99 | Ht 64.0 in | Wt 96.0 lb

## 2022-07-22 DIAGNOSIS — R3121 Asymptomatic microscopic hematuria: Secondary | ICD-10-CM

## 2022-07-22 DIAGNOSIS — R3129 Other microscopic hematuria: Secondary | ICD-10-CM

## 2022-07-22 DIAGNOSIS — N200 Calculus of kidney: Secondary | ICD-10-CM

## 2022-07-22 NOTE — Telephone Encounter (Signed)
Requested medication (s) are due for refill today: yes  Requested medication (s) are on the active medication list: yes  Last refill:  05/10/22  Future visit scheduled: yes  Notes to clinic:  Unable to refill per protocol, cannot delegate.      Requested Prescriptions  Pending Prescriptions Disp Refills   ondansetron (ZOFRAN) 4 MG tablet [Pharmacy Med Name: ONDANSETRON HCL 4 MG TABLET] 45 tablet 0    Sig: TAKE 1 TABLET BY MOUTH EVERY 8 HOURS AS NEEDED FOR NAUSEA AND VOMITING     Not Delegated - Gastroenterology: Antiemetics - ondansetron Failed - 07/21/2022 12:53 PM      Failed - This refill cannot be delegated      Failed - AST in normal range and within 360 days    AST  Date Value Ref Range Status  06/30/2022 13 (L) 15 - 41 U/L Final   SGOT(AST)  Date Value Ref Range Status  04/16/2013 16 15 - 37 Unit/L Final         Passed - ALT in normal range and within 360 days    ALT  Date Value Ref Range Status  06/30/2022 12 0 - 44 U/L Final   SGPT (ALT)  Date Value Ref Range Status  04/16/2013 21 12 - 78 U/L Final         Passed - Valid encounter within last 6 months    Recent Outpatient Visits           1 week ago Anxiety and depression   [place], [place] T, NP   4 weeks ago Acute cystitis with hematuria   [place], Megan P, DO   2 months ago Protein-calorie malnutrition, moderate ([place])   [place], [place] T, NP   3 months ago Fever, unspecified fever cause   [facility] [place] [facility] [place] [place], Henrine Screws T, NP   4 months ago Attention deficit hyperactivity disorder (ADHD), combined type   [place], Barbaraann Faster, NP       Future Appointments             Tomorrow [place], MD [facility]   In 4 weeks [place], Barbaraann Faster, NP [place], [place]   In 12 months [place],  [place], [place]

## 2022-07-22 NOTE — Progress Notes (Signed)
Cystoscopy Procedure Note:  Indication: Microscopic hematuria  After informed consent and discussion of the procedure and its risks, Megan Bentley was positioned and prepped in the standard fashion. Cystoscopy was performed with a flexible cystoscope. The urethra, bladder neck and entire bladder was visualized in a standard fashion. The ureteral orifices were visualized in their normal location and orientation.  No abnormalities on retroflexion  Imaging: I personally viewed and interpreted the CT abdomen and pelvis with contrast dated 06/30/2022, no hydronephrosis, no urologic abnormalities, punctate and stable nonobstructive right midpole renal stone  Findings: Normal cystoscopy  Assessment and Plan: Normal bladder  Nickolas Madrid, MD 07/22/2022

## 2022-07-23 ENCOUNTER — Encounter: Payer: Self-pay | Admitting: Internal Medicine

## 2022-07-23 ENCOUNTER — Ambulatory Visit: Payer: Medicaid Other | Attending: Internal Medicine | Admitting: Internal Medicine

## 2022-07-23 VITALS — BP 103/70 | HR 85 | Resp 16 | Ht 64.0 in | Wt 93.0 lb

## 2022-07-23 DIAGNOSIS — R768 Other specified abnormal immunological findings in serum: Secondary | ICD-10-CM | POA: Diagnosis not present

## 2022-07-23 DIAGNOSIS — R21 Rash and other nonspecific skin eruption: Secondary | ICD-10-CM

## 2022-07-23 DIAGNOSIS — N200 Calculus of kidney: Secondary | ICD-10-CM

## 2022-07-23 DIAGNOSIS — I7300 Raynaud's syndrome without gangrene: Secondary | ICD-10-CM | POA: Diagnosis not present

## 2022-07-23 MED ORDER — HYDROXYCHLOROQUINE SULFATE 200 MG PO TABS: 200.0000 mg | ORAL_TABLET | Freq: Every day | ORAL | 2 refills | Status: DC

## 2022-07-23 NOTE — Patient Instructions (Signed)
Hydroxychloroquine Tablets What is this medication? HYDROXYCHLOROQUINE (hye drox ee KLOR oh kwin) treats autoimmune conditions, such as rheumatoid arthritis and lupus. It works by slowing down an overactive immune system. It may also be used to prevent and treat malaria. It works by killing the parasite that causes malaria. It belongs to a group of medications called DMARDs. This medicine may be used for other purposes; ask your health care provider or pharmacist if you have questions. COMMON BRAND NAME(S): Plaquenil, Quineprox What should I tell my care team before I take this medication? They need to know if you have any of these conditions: Diabetes Eye disease, vision problems Frequently drink alcohol G6PD deficiency Heart disease Irregular heartbeat or rhythm Kidney disease Liver disease Porphyria Psoriasis An unusual or allergic reaction to hydroxychloroquine, other medications, foods, dyes, or preservatives Pregnant or trying to get pregnant Breastfeeding How should I use this medication? Take this medication by mouth with water. Take it as directed on the prescription label. Do not cut, crush, or chew this medication. Swallow the tablets whole. Take it with food. Do not take it more than directed. Take all of this medication unless your care team tells you to stop it early. Keep taking it even if you think you are better. Take products with antacids in them at a different time of day than this medication. Take this medication 4 hours before or 4 hours after antacids. Talk to your care team if you have questions. Talk to your care team about the use of this medication in children. While this medication may be prescribed for selected conditions, precautions do apply. Overdosage: If you think you have taken too much of this medicine contact a poison control center or emergency room at once. NOTE: This medicine is only for you. Do not share this medicine with others. What if I miss a  dose? If you miss a dose, take it as soon as you can. If it is almost time for your next dose, take only that dose. Do not take double or extra doses. What may interact with this medication? Do not take this medication with any of the following: Cisapride Dronedarone Pimozide Thioridazine This medication may also interact with the following: Ampicillin Antacids Cimetidine Cyclosporine Digoxin Kaolin Medications for diabetes, such as insulin, glipizide, glyburide Medications for seizures, such as carbamazepine, phenobarbital, phenytoin Mefloquine Methotrexate Other medications that cause heart rhythm changes Praziquantel This list may not describe all possible interactions. Give your health care provider a list of all the medicines, herbs, non-prescription drugs, or dietary supplements you use. Also tell them if you smoke, drink alcohol, or use illegal drugs. Some items may interact with your medicine. What should I watch for while using this medication? Visit your care team for regular checks on your progress. Tell your care team if your symptoms do not start to get better or if they get worse. You may need blood work done while you are taking this medication. If you take other medications that can affect heart rhythm, you may need more testing. Talk to your care team if you have questions. Your vision may be tested before and during use of this medication. Tell your care team right away if you have any change in your eyesight. This medication may cause serious skin reactions. They can happen weeks to months after starting the medication. Contact your care team right away if you notice fevers or flu-like symptoms with a rash. The rash may be red or purple and then turn  into blisters or peeling of the skin. Or, you might notice a red rash with swelling of the face, lips or lymph nodes in your neck or under your arms. If you or your family notice any changes in your behavior, such as new or  worsening depression, thoughts of harming yourself, anxiety, or other unusual or disturbing thoughts, or memory loss, call your care team right away. What side effects may I notice from receiving this medication? Side effects that you should report to your care team as soon as possible: Allergic reactions--skin rash, itching, hives, swelling of the face, lips, tongue, or throat Aplastic anemia--unusual weakness or fatigue, dizziness, headache, trouble breathing, increased bleeding or bruising Change in vision Heart rhythm changes--fast or irregular heartbeat, dizziness, feeling faint or lightheaded, chest pain, trouble breathing Infection--fever, chills, cough, or sore throat Low blood sugar (hypoglycemia)--tremors or shaking, anxiety, sweating, cold or clammy skin, confusion, dizziness, rapid heartbeat Muscle injury--unusual weakness or fatigue, muscle pain, dark yellow or brown urine, decrease in amount of urine Pain, tingling, or numbness in the hands or feet Rash, fever, and swollen lymph nodes Redness, blistering, peeling, or loosening of the skin, including inside the mouth Thoughts of suicide or self-harm, worsening mood, or feelings of depression Unusual bruising or bleeding Side effects that usually do not require medical attention (report to your care team if they continue or are bothersome): Diarrhea Headache Nausea Stomach pain Vomiting This list may not describe all possible side effects. Call your doctor for medical advice about side effects. You may report side effects to FDA at 1-800-FDA-1088. Where should I keep my medication? Keep out of the reach of children and pets. Store at room temperature up to 30 degrees C (86 degrees F). Protect from light. Get rid of any unused medication after the expiration date. To get rid of medications that are no longer needed or have expired: Take the medication to a medication take-back program. Check with your pharmacy or law enforcement  to find a location. If you cannot return the medication, check the label or package insert to see if the medication should be thrown out in the garbage or flushed down the toilet. If you are not sure, ask your care team. If it is safe to put it in the trash, empty the medication out of the container. Mix the medication with cat litter, dirt, coffee grounds, or other unwanted substance. Seal the mixture in a bag or container. Put it in the trash. NOTE: This sheet is a summary. It may not cover all possible information. If you have questions about this medicine, talk to your doctor, pharmacist, or health care provider.  2023 Elsevier/Gold Standard (2004-08-11 00:00:00)
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
## 2022-07-24 ENCOUNTER — Encounter: Payer: Self-pay | Admitting: Internal Medicine

## 2022-07-27 ENCOUNTER — Inpatient Hospital Stay: Payer: Medicaid Other | Attending: Internal Medicine | Admitting: Licensed Clinical Social Worker

## 2022-07-27 DIAGNOSIS — D509 Iron deficiency anemia, unspecified: Secondary | ICD-10-CM

## 2022-07-27 NOTE — Progress Notes (Signed)
[place] Work  Initial Assessment   Megan Bentley is a 37 y.o. year old female contacted by phone. Clinical Social Work was referred by medical provider for assessment of psychosocial needs.   SDOH (Social Determinants of Health) assessments performed: Yes SDOH Interventions    Flowsheet Row Office Visit from 07/09/2022 in [place] Most recent reading at 07/09/2022  9:46 AM Clinical Support from 07/05/2022 in [place] at [facility] Most recent reading at 07/05/2022  4:15 PM Patient Outreach Telephone from 03/22/2022 in [place] Most recent reading at 03/22/2022  2:54 PM Office Visit from 03/22/2022 in [place] Most recent reading at 03/22/2022  2:21 PM Office Visit from 02/17/2022 in [place] Most recent reading at 02/17/2022  1:14 PM Office Visit from 01/05/2022 in [place] Most recent reading at 01/05/2022 11:07 AM  SDOH Interventions        Utilities Interventions -- Intervention Not Indicated -- -- -- --  Depression Interventions/Treatment  Currently on Treatment -- -- Currently on Treatment PHQ2-9 Score <4 Follow-up Not Indicated Medication, Currently on Treatment  Stress Interventions -- -- Provide Counseling, [place] -- -- --       [place]: No Food Insecurity (04/10/2020)  Housing: [place]  (04/10/2020)  Transportation Needs: No Transportation Needs (04/10/2020)  Utilities: Not At Risk (07/05/2022)  Alcohol Screen: Low Risk  (04/10/2020)  Depression (PHQ2-9): High Risk (07/09/2022)  Financial Resource Strain: Low Risk  (04/10/2020)  Physical Activity: Inactive (04/10/2020)  Social Connections: Moderately Isolated (04/10/2020)  Stress: Stress Concern Present (03/22/2022)  Tobacco Use: Medium Risk (07/23/2022)     Distress Screen completed:  Yes    07/05/2022   11:33 AM  ONCBCN DISTRESS SCREENING  Screening Type Initial Screening  Distress experienced in past week (1-10) 10  Emotional problem type Depression;Nervousness/Anxiety;Isolation/feeling alone  Physical Problem type Pain;Nausea/vomiting;Loss of appetitie  Physician notified of physical symptoms Yes  Referral to clinical social work Yes      Family/Social Information:  Housing Arrangement: patient lives with minor children ages 50, 58 and grandfather, main contact mother Megan Bentley [phone]   Family members/support persons in your life? Family, Friends, and Geophysical data processor concerns: no  Employment: Unemployed .  Income source: Supported by [employer] Financial concerns: No immediate concerns identified Type of concern: None Food access concerns: no Religious or spiritual practice: Hydrographic surveyor Currently in place:  [place] Medicaid Pre-Paid, SNAP benefits  Coping/ Adjustment to diagnosis: Patient understands treatment plan and what happens next? no, patient expressed frustration about lack of specific diagnosis Concerns about diagnosis and/or treatment: Pain or discomfort during procedures and Overwhelmed by information Patient reported stressors: Anxiety/ nervousness, Adjusting to my illness, and Isolation/ feeling alone, fatigue, physical issues Hopes and/or priorities: to have a definite diagnosis Patient enjoys time with family/ friends and spending time with children Current coping skills/ strengths: Ability for insight , Active sense of humor , Average or above average intelligence , Capable of independent living , Communication skills , Motivation for treatment/growth , Religious Affiliation , and Supportive family/friends     SUMMARY: Current SDOH Barriers:  Financial constraints related to lack of consistent income, Limited social support, and Limited education about current medical issues and concerns*  Clinical  Social Work Clinical Goal(s):  Patient will follow [facility] with [facility] health Department to inquire  about WIC* as directed by SW  Interventions: Discussed common feeling and emotions when being diagnosed with cancer, and the importance of support during treatment Informed patient of the support team roles and support services at [facility] Provided CSW contact information and encouraged patient to call with any questions or concerns Provided patient with information about CSW role in patient care and other available resources.    Follow Up Plan: Patient will contact CSW with any support or resource needs Patient verbalizes understanding of plan: Yes    Megan Amas, LCSW   Patient is participating in a Managed Medicaid Plan:  Yes
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
## 2022-08-02 ENCOUNTER — Telehealth: Payer: Self-pay | Admitting: *Deleted

## 2022-08-02 ENCOUNTER — Telehealth: Payer: Self-pay

## 2022-08-02 ENCOUNTER — Encounter: Payer: Self-pay | Admitting: Internal Medicine

## 2022-08-02 NOTE — Telephone Encounter (Signed)
Patient called stating she is returning your call.

## 2022-08-02 NOTE — Telephone Encounter (Signed)
Patient called stating that last night her eyes were swollen, now they are red with blurry vision. She is having pain all over and hardly eating. Would like something for pain preferably suboxone or tramadol.

## 2022-08-03 ENCOUNTER — Encounter: Payer: Self-pay | Admitting: Internal Medicine

## 2022-08-03 ENCOUNTER — Encounter: Payer: Self-pay | Admitting: Nurse Practitioner

## 2022-08-03 DIAGNOSIS — G43109 Migraine with aura, not intractable, without status migrainosus: Secondary | ICD-10-CM | POA: Diagnosis not present

## 2022-08-04 DIAGNOSIS — Z87891 Personal history of nicotine dependence: Secondary | ICD-10-CM | POA: Diagnosis not present

## 2022-08-04 DIAGNOSIS — H532 Diplopia: Secondary | ICD-10-CM | POA: Diagnosis not present

## 2022-08-04 DIAGNOSIS — H539 Unspecified visual disturbance: Secondary | ICD-10-CM | POA: Diagnosis not present

## 2022-08-04 DIAGNOSIS — R1084 Generalized abdominal pain: Secondary | ICD-10-CM | POA: Diagnosis not present

## 2022-08-04 DIAGNOSIS — M545 Low back pain, unspecified: Secondary | ICD-10-CM | POA: Diagnosis not present

## 2022-08-04 DIAGNOSIS — R112 Nausea with vomiting, unspecified: Secondary | ICD-10-CM | POA: Diagnosis not present

## 2022-08-05 ENCOUNTER — Encounter: Payer: Self-pay | Admitting: Nurse Practitioner

## 2022-08-05 ENCOUNTER — Ambulatory Visit: Payer: Self-pay | Admitting: *Deleted

## 2022-08-05 NOTE — Telephone Encounter (Signed)
  Chief Complaint: Pain Symptoms: Abdominal and back pain, 10/10. Tearful. Butterfly rash to face. Skin dry and peeling,yellow. Frequency: Abdominal pain 2 days ago, back pain ongoing.  Pertinent Negatives: Patient denies  Disposition: []$ ED /[]$ Urgent Care (no appt availability in office) / []$ Appointment(In office/virtual)/ []$  [facility] [facility]/ []$ Home Care/ []$ Refused Recommended Disposition /[]$ [place] [facility]/ [x]$  Follow-up with PCP Additional Notes: Pt tearful. Has sent several MyChart messages. States "Cannot go on with this pain, I don't understand why I cannot get any help, getting pushed around to different doctors, ED."  Offered appt today with Santiago Glad, declines, will see [name] only "She is the only one that can prescribe my meds."   States she "Went down on my Gabapentin dose and I feel better in some ways but not so with the pain."  Reason for Disposition  [1] SEVERE back pain (e.g., excruciating, unable to do any normal activities) AND [2] not improved 2 hours after pain medicine  Answer Assessment - Initial Assessment Questions 1. ONSET: "When did the pain begin?"       2 days ago, stomach pain 2. LOCATION: "Where does it hurt?" (upper, mid or lower back)     Back, lower left side of back. Upper left part of stomach, under rib cage 3. SEVERITY: "How bad is the pain?"  (e.g., Scale 1-10; mild, moderate, or severe)   - MILD (1-3): Doesn't interfere with normal activities.    - MODERATE (4-7): Interferes with normal activities or awakens from sleep.    - SEVERE (8-10): Excruciating pain, unable to do any normal activities.      10/10 4. PATTERN: "Is the pain constant?" (e.g., yes, no; constant, intermittent)      Constant 5. RADIATION: "Does the pain shoot into your legs or somewhere else?"      6. CAUSE:  "What do you think is causing the back pain?"       7. BACK OVERUSE:  "Any recent lifting of heavy objects, strenuous work or exercise?"     NA 8. MEDICINES: "What  have you taken so far for the pain?" (e.g., nothing, acetaminophen, NSAIDS)      9. NEUROLOGIC SYMPTOMS: "Do you have any weakness, numbness, or problems with bowel/bladder control?"     NA 10. OTHER SYMPTOMS: "Do you have any other symptoms?" (e.g., fever, abdomen pain, burning with urination, blood in urine)       Abdominal pain, butterfly rash on face present, skin dry peeling, yellow tint to skin.  Protocols used: Back Pain-A-AH

## 2022-08-05 NOTE — Telephone Encounter (Signed)
Contacted patient and informed her that [name] recommended that she come in for an appointment. Appointment scheduled for 08/06/22 @ 4pm.

## 2022-08-05 NOTE — Telephone Encounter (Signed)
She is scheduled tomorrow and will discuss.  We have had many discussion about risks of opioids with her current Vyvanse and benzo regimen.

## 2022-08-05 NOTE — Patient Instructions (Incomplete)
Chronic Pain, Adult Chronic pain is a type of pain that lasts or keeps coming back for at least 3-6 months. You may have headaches, pain in the abdomen, or pain in other areas of the body. Chronic pain may be related to an illness, injury, or a health condition. Sometimes, the cause of chronic pain is not known. Chronic pain can make it hard for you to do daily activities. If it is not treated, chronic pain can lead to anxiety and depression. Treatment depends on the cause of your pain and how severe it is. You may need to work with a pain specialist to come up with a treatment plan. Many people benefit from two or more types of treatment to control their pain. Follow these instructions at home: Treatment plan Follow your treatment plan as told by your health care provider. This may include: Gentle, regular exercise. Eating a healthy diet that includes foods such as vegetables, fruits, fish, and lean meats. Mental health therapy (cognitive or behavioral therapy) that changes the way you think or act in response to the pain. This may help improve how you feel. Doing physical therapy exercises to improve movement and strength. Meditation, yoga, acupuncture, or massage therapy. Using the oils from plants in your environment or on your skin (aromatherapy). Other treatments may include: Over-the-counter or prescription medicines. Color, light, or sound therapy. Local electrical stimulation. The electrical pulses help to relieve pain by temporarily stopping the nerve impulses that cause you to feel pain. Injections. These deliver numbing or pain-relieving medicines into the spine or the area of pain.  Medicines Take over-the-counter and prescription medicines only as told by your health care provider. Ask your health care provider if the medicine prescribed to you: Requires you to avoid driving or using machinery. Can cause constipation. You may need to take these actions to prevent or treat  constipation: Drink enough fluid to keep your urine pale yellow. Take over-the-counter or prescription medicines. Eat foods that are high in fiber, such as beans, whole grains, and fresh fruits and vegetables. Limit foods that are high in fat and processed sugars, such as fried or sweet foods. Lifestyle  Ask your health care provider whether you should keep a pain diary. Your health care provider will tell you what information to write in the diary. This may include: When you have pain. What the pain feels like. How medicines and other behaviors or treatments help to reduce the pain. Consider talking with a mental health care provider about how to help manage chronic pain. Consider joining a chronic pain support group. Try to control or lower your stress levels. Talk with your health care provider about ways to do this. General instructions Learn as much as you can about how to manage your chronic pain. Ask your health care provider if an intensive pain rehabilitation program or a chronic pain specialist would be helpful. Check your pain level as told by your health care provider. Ask your health care provider if you should use a pain scale. Contact a health care provider if: Your pain is not controlled with treatment. You have new pain. You have side effects from pain medicine. You feel weak or you have trouble doing your normal activities. You have trouble sleeping or you develop confusion. You lose feeling or have numbness in your body. You lose control of your bowels or bladder. Get help right away if: Your pain suddenly gets much worse. You develop chest pain. You have trouble breathing or shortness of  breath. You faint, or another person sees you faint. These symptoms may be an emergency. Get help right away. Call 911. Do not wait to see if the symptoms will go away. Do not drive yourself to the hospital. Also, get help right away if: You have thoughts about hurting yourself  or others. Take one of these steps if you feel like you may hurt yourself or others, or have thoughts about taking your own life: Go to your nearest emergency room. Call 911. Call the [place] at [phone] or 988. This is open 24 hours a day. Text the Crisis Text Line at 248 817 4334. This information is not intended to replace advice given to you by your health care provider. Make sure you discuss any questions you have with your health care provider. Document Revised: 01/20/2022 Document Reviewed: 12/23/2021 Elsevier Patient Education  [place].

## 2022-08-06 ENCOUNTER — Ambulatory Visit: Payer: Medicaid Other | Admitting: Nurse Practitioner

## 2022-08-06 ENCOUNTER — Telehealth (INDEPENDENT_AMBULATORY_CARE_PROVIDER_SITE_OTHER): Payer: Medicaid Other | Admitting: Nurse Practitioner

## 2022-08-06 ENCOUNTER — Encounter: Payer: Self-pay | Admitting: Nurse Practitioner

## 2022-08-06 DIAGNOSIS — H539 Unspecified visual disturbance: Secondary | ICD-10-CM | POA: Diagnosis not present

## 2022-08-06 DIAGNOSIS — G894 Chronic pain syndrome: Secondary | ICD-10-CM | POA: Diagnosis not present

## 2022-08-06 DIAGNOSIS — R768 Other specified abnormal immunological findings in serum: Secondary | ICD-10-CM | POA: Diagnosis not present

## 2022-08-06 DIAGNOSIS — Z8489 Family history of other specified conditions: Secondary | ICD-10-CM | POA: Diagnosis not present

## 2022-08-06 MED ORDER — HYDROCODONE-ACETAMINOPHEN 5-325 MG PO TABS: 1.0000 | ORAL_TABLET | Freq: Four times a day (QID) | ORAL | 0 refills | Status: AC | PRN

## 2022-08-06 NOTE — Progress Notes (Signed)
LMP 06/11/2021 (Approximate)    Subjective:    Patient ID: Megan Bentley, female    DOB: Aug 13, 1985, 37 y.o.   MRN: [id number]  HPI: Megan Bentley is a 36 y.o. female  Chief Complaint  Patient presents with   Entire body pain   This visit was completed via video visit through MyChart due to the restrictions of the COVID-19 pandemic. All issues as above were discussed and addressed. Physical exam was done as above through visual confirmation on video through MyChart. If it was felt that the patient should be evaluated in the office, they were directed there. The patient verbally consented to this visit. Location of the patient: home Location of the provider: work Those involved with this call:  Provider: Marnee Guarneri, DNP CMA: Frazier Butt, [place] Desk/Registration: Leota Jacobsen  Time spent on call:  21 minutes with patient face to face via video conference. More than 50% of this time was spent in counseling and coordination of care. 15 minutes total spent in review of patient's record and preparation of their chart.  I verified patient identity using two factors (patient name and date of birth). Patient consents verbally to being seen via telemedicine visit today.    CHRONIC PAIN  Presents today for entire body pain -- reports back, stomach, and legs. Currently takes Gabapentin 300 MG daily, with higher dosing nerves got worse.  Reports all over entire body pain and states frustration over no answers.  Seen in [facility] ER on 08/04/22 for vision changes -- CT no acute findings and labs overall reassuring, they recommended she continue Plaquenil as ordered and follow up with rheumatology and PCP + they placed a referral to ophthalmology.   History: Has known positive ANA, currently followed by Dr. Benjamine Mola with rheumatology -- was started on Plaquenil + Prednisone at visit 07/23/22, she did not initially start taking due to concerns about side effects, but is now taking.  She also follows  with neurology, Dr. Melrose Nakayama, last visit 08/03/22 -- he continues to fill her Klonopin (last fill 07/29/22) -- he placed referrals for 2nd opinions to rheumatology, neurology, and psychiatry.  She was on Vyvanse (last fill 07/03/22) for her ADHD and nursing school, prescribed by PCP, but currently reports not taking recently.  Her father has hereditary spastic paraplegia and she had referral placed 05/11/21 to see his specialist at [facility], have recommended multiple times she schedule with him, she has yet to do so.    Continues in medication lock in program through [facility] where it is stating she can only have two providers prescribing controlled substances -- PCP and neurology. Lock in for two years she reports.  Started 2022. Pain control status: uncontrolled Duration: months Location:  Quality: dull and aching Current Pain Level: 8/10 What Activities task can be accomplished with current medication? Minimal at present Previous pain specialty evaluation: no Non-narcotic analgesic meds: yes   Relevant past medical, surgical, family and social history reviewed and updated as indicated. Interim medical history since our last visit reviewed. Allergies and medications reviewed and updated.  Review of Systems  Constitutional:  Negative for activity change, appetite change, diaphoresis, fatigue and fever.  Respiratory:  Negative for cough, chest tightness and shortness of breath.   Cardiovascular:  Negative for chest pain, palpitations and leg swelling.  Gastrointestinal: Negative.   Musculoskeletal:  Positive for arthralgias and back pain.  Neurological: Negative.   Psychiatric/Behavioral: Negative.      Per HPI unless specifically indicated above  Objective:    LMP 06/11/2021 (Approximate)   Wt Readings from Last 3 Encounters:  07/23/22 93 lb (42.2 kg)  07/22/22 96 lb (43.5 kg)  07/09/22 93 lb 8 oz (42.4 kg)    Physical Exam Vitals and nursing note reviewed.  Constitutional:       General: She is awake. She is not in acute distress.    Appearance: She is well-developed and well-groomed. She is not ill-appearing or toxic-appearing.  HENT:     Head: Normocephalic.     Right Ear: Hearing normal.     Left Ear: Hearing normal.  Eyes:     General: Lids are normal.        Right eye: No discharge.        Left eye: No discharge.     Conjunctiva/sclera: Conjunctivae normal.  Pulmonary:     Effort: Pulmonary effort is normal. No accessory muscle usage or respiratory distress.  Musculoskeletal:     Cervical back: Normal range of motion.  Neurological:     Mental Status: She is alert and oriented to person, place, and time.  Psychiatric:        Attention and Perception: Attention normal.        Mood and Affect: Mood normal.        Behavior: Behavior normal. Behavior is cooperative.        Thought Content: Thought content normal.        Judgment: Judgment normal.    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  Referral placed to Dr. Nanine Means with [facility] (who father and aunt have seen) + pain clinic setting.  She is aware provider does not perform chronic pain management and reports wishes not to take medication for long periods.  At this time will send in 5 day only supply of Norco 5-325 MG, as Tramadol offered no benefit in past and Gabapentin has side effects.  She is aware not to take this with her Klonopin and reports not taking Vyvanse in week.  Discussed at length risks with opioid and benzo use, she is to sparsely use this 5 day prescription and only as needed for  severe pain.        Relevant Medications   HYDROcodone-acetaminophen (NORCO) 5-325 MG tablet   Other Relevant Orders   Ambulatory referral to Pain Clinic   Family history of genetic disease    New referral placed for Dr. Nanine Means, as concern that patient may have underlying similar genetic disorder to her father/aunt (hereditary spastic paraplegia).  Discussed with patient at length importance of scheduling this visit, as did not schedule past referral.  She agrees with this plan.      Relevant Orders   Ambulatory referral to Neurology   Ambulatory referral to Pain Clinic   Positive ANA (antinuclear antibody)    Chronic, ongoing.  Continue collaboration with rheumatology, recent note reviewed, and continue medications as prescribed by them.      Relevant Orders   Ambulatory referral to Pain Clinic   Vision changes  Recent ER evaluation was overall reassuring.  Recommend she schedule with opthalmology via the number they provider her.  She plans on doing this.        Follow up plan: Return in about 2 weeks (around 08/20/2022) for as scheduled.

## 2022-08-06 NOTE — Assessment & Plan Note (Signed)
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  Referral placed to Dr. Nanine Means with [facility] (who father and aunt have seen) + pain clinic setting.  She is aware provider does not perform chronic pain management and reports wishes not to take medication for long periods.  At this time will send in 5 day only supply of Norco 5-325 MG, as Tramadol offered no benefit in past and Gabapentin has side effects.  She is aware not to take this with her Klonopin and reports not taking Vyvanse in week.  Discussed at length risks with opioid and benzo use, she is to sparsely use this 5 day prescription and only as needed for severe pain.

## 2022-08-06 NOTE — Patient Instructions (Signed)
Joint Pain  Joint pain can be caused by many things. It is likely to go away if you follow instructions from your doctor for taking care of yourself at home. Sometimes, you may need more treatment. Follow these instructions at home: Managing pain, stiffness, and swelling     If told, put ice on the painful area. To do this: If you have a removable elastic bandage, sling, or splint, take it off as told by your doctor. Put ice in a plastic bag. Place a towel between your skin and the bag. Leave the ice on for 20 minutes, 2-3 times a day. Take off the ice if your skin turns bright red. This is very important. If you cannot feel pain, heat, or cold, you have a greater risk of damage to the area. Move your fingers or toes below the painful joint often. Raise the painful joint above the level of your heart while you are sitting or lying down. If told, put heat on the painful area. Do this as often as told by your doctor. Use the heat source that your doctor recommends, such as a moist heat pack or a heating pad. Place a towel between your skin and the heat source. Leave the heat on for 20-30 minutes. Take off the heat if your skin gets bright red. This is especially important if you are unable to feel pain, heat, or cold. You may have a greater risk of getting burned. Activity Rest the painful joint for as long as told by your doctor. Do not do things that cause pain or make your pain worse. Begin exercising or stretching the affected area, as told by your doctor. Ask your doctor what types of exercise are safe for you. Return to your normal activities when your doctor says that it is safe. If you have an elastic bandage, sling, or splint: Wear it as told by your doctor. Take it only as told by your doctor. Loosen it your fingers or toes below the joint: Tingle. Become numb. Get cold and blue. Keep it clean. Ask your doctor if you should take it off before bathing. If it is not  waterproof: Do not let it get wet. Cover it with a watertight covering when you take a bath or shower. General instructions Take over-the-counter and prescription medicines only as told by your doctor. This may include medicines taken by mouth or applied to the skin. Do not smoke or use any products that contain nicotine or tobacco. If you need help quitting, ask your doctor. Keep all follow-up visits as told by your doctor. This is important. Contact a doctor if: You have pain that gets worse and does not get better with medicine. Your joint pain does not get better in 3 days. You have more bruising or swelling. You have a fever. You lose 10 lb (4.5 kg) or more without trying. Get help right away if: You cannot move the joint. Your fingers or toes tingle, become numb. or get cold and blue. You have a fever along with a joint that is red, warm, and swollen. Summary Joint pain can be caused by many things. It often goes away if you follow instructions from your doctor for taking care of yourself at home. Rest the painful joint for as long as told. Do not do things that cause pain or make your pain worse. Take over-the-counter and prescription medicines only as told by your doctor. This information is not intended to replace advice given to you  by your health care provider. Make sure you discuss any questions you have with your health care provider. Document Revised: 09/12/2019 Document Reviewed: 09/12/2019 Elsevier Patient Education  [place].

## 2022-08-06 NOTE — Assessment & Plan Note (Signed)
New referral placed for Dr. Nanine Means, as concern that patient may have underlying similar genetic disorder to her father/aunt (hereditary spastic paraplegia).  Discussed with patient at length importance of scheduling this visit, as did not schedule past referral.  She agrees with this plan.

## 2022-08-06 NOTE — Assessment & Plan Note (Signed)
Recent ER evaluation was overall reassuring.  Recommend she schedule with opthalmology via the number they provider her.  She plans on doing this.

## 2022-08-06 NOTE — Assessment & Plan Note (Signed)
Chronic, ongoing.  Continue collaboration with rheumatology, recent note reviewed, and continue medications as prescribed by them.

## 2022-08-09 ENCOUNTER — Encounter: Payer: Self-pay | Admitting: Nurse Practitioner

## 2022-08-13 DIAGNOSIS — Z419 Encounter for procedure for purposes other than remedying health state, unspecified: Secondary | ICD-10-CM | POA: Diagnosis not present

## 2022-08-18 ENCOUNTER — Encounter: Payer: Medicaid Other | Admitting: Psychology

## 2022-08-18 IMAGING — MR MR LUMBAR SPINE W/O CM
5 series · 31 of 48 positions shown · non-contrast
Comparison: None.

CLINICAL DATA: Low back pain, trauma

EXAM:
MRI LUMBAR SPINE WITHOUT CONTRAST
TECHNIQUE: Multiplanar, multisequence MR imaging of the lumbar spine was
performed. No intravenous contrast was administered.

[Series 5: T2 · sagittal · 4.0mm · 0.81mm/px · 6 of 17 slices shown (1 of 2)]
[im 1/17]
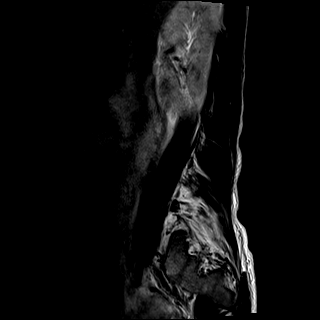
[im 4/17]
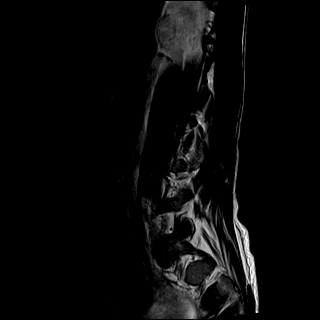
[im 7/17]
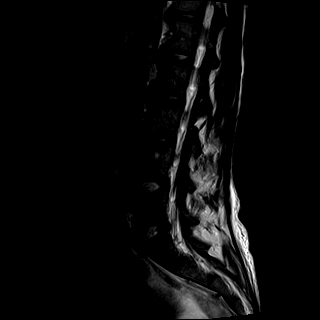
[im 10/17]
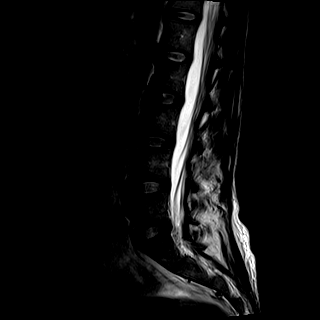
[im 13/17]
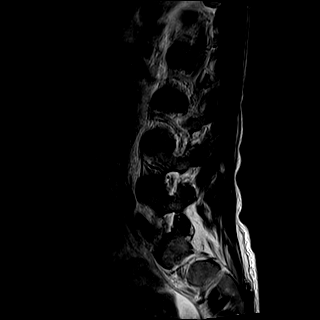
[im 17/17]
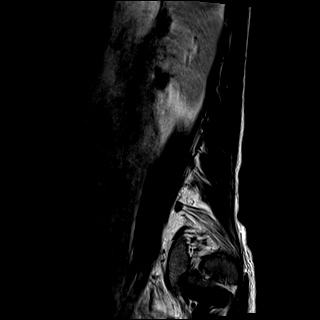

[Series 6: T1 · sagittal · 4.0mm · 0.81mm/px · 7 of 17 slices shown (1 of 2)]
[im 1/17]
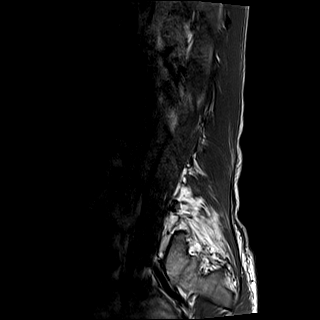
[im 3/17]
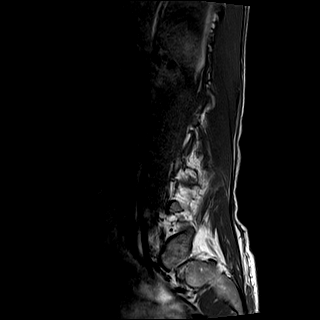
[im 6/17]
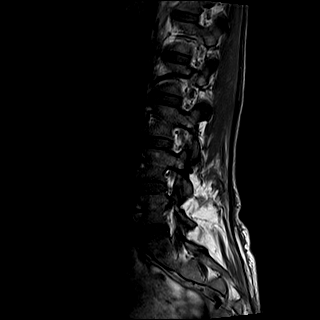
[im 9/17]
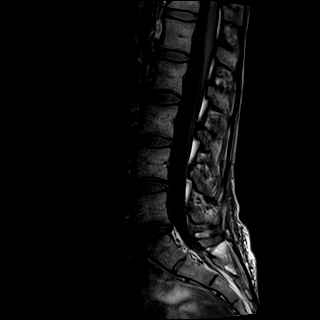
[im 11/17]
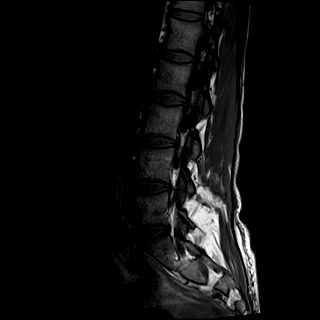
[im 14/17]
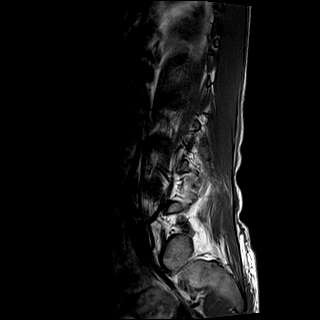
[im 17/17]
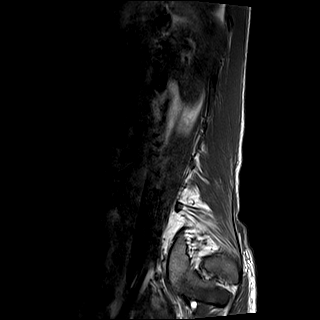

[Series 7: STIR · sagittal · 4.0mm · 0.41mm/px · 2 of 17 slices shown]
[im 1/17]
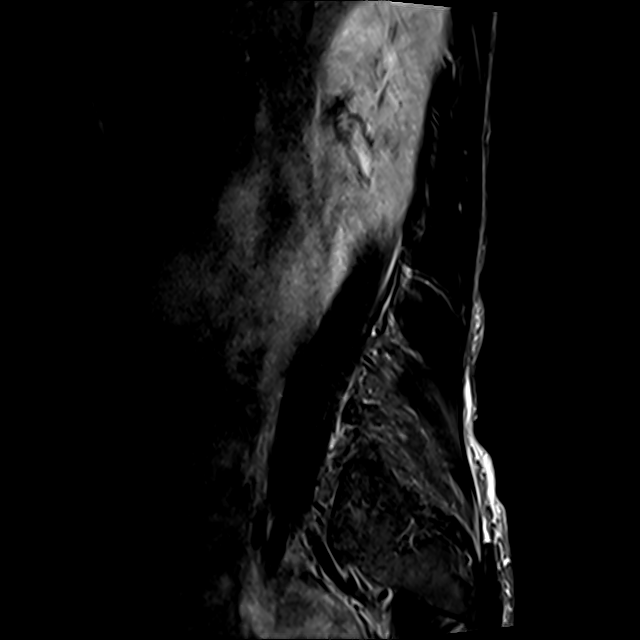
[im 3/17]
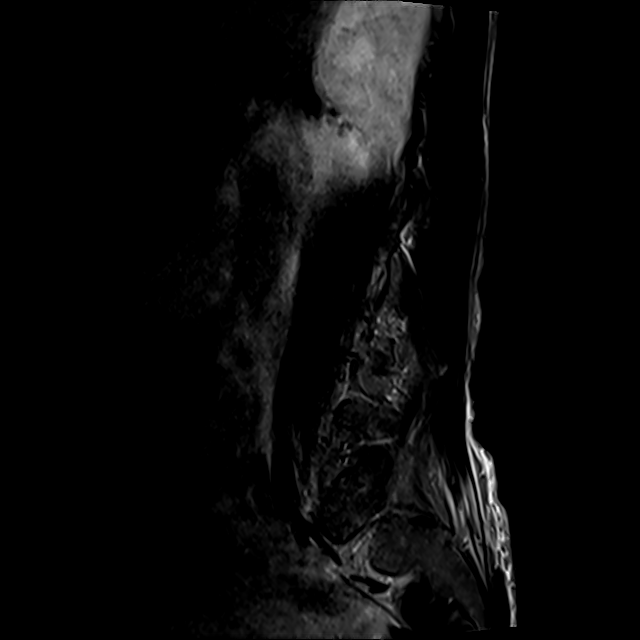

[Series 8: T2 · axial · 4.0mm · 0.78mm/px · z∈[-116,+86]mm · 8 of 35 slices shown (2 of 2)]
[im 1/35]
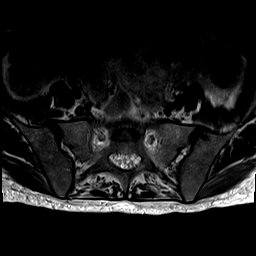
[im 6/35]
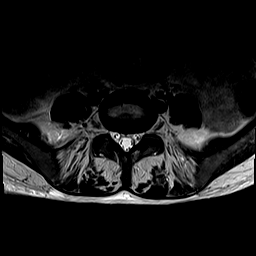
[im 11/35]
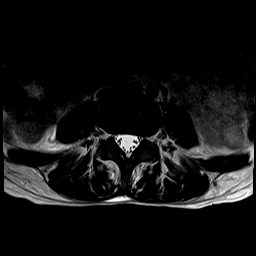
[im 16/35]
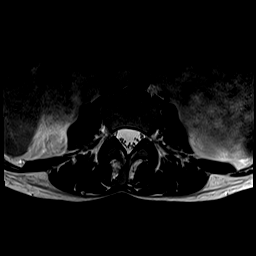
[im 19/35]
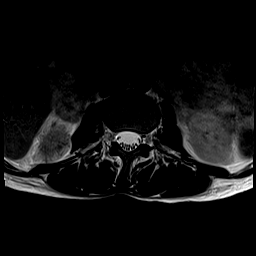
[im 24/35]
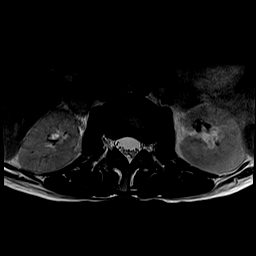
[im 29/35]
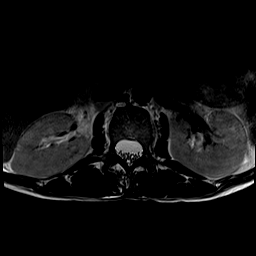
[im 35/35]
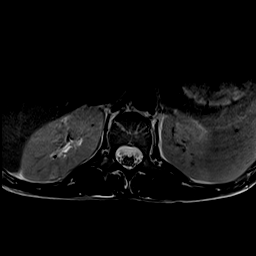

[Series 9: T1 · axial · 4.0mm · 0.39mm/px · z∈[-116,+86]mm · 8 of 35 slices shown (2 of 2)]
[im 1/35]
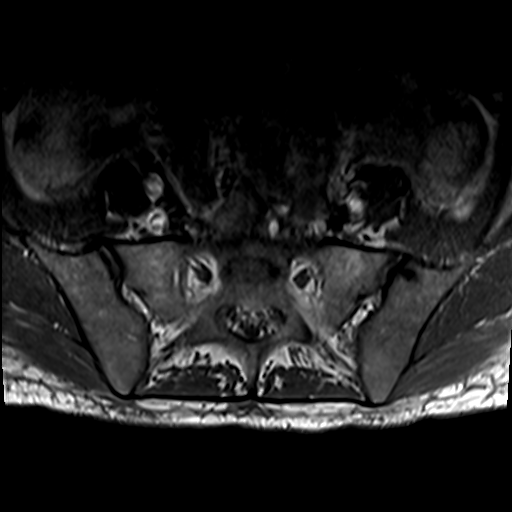
[im 6/35]
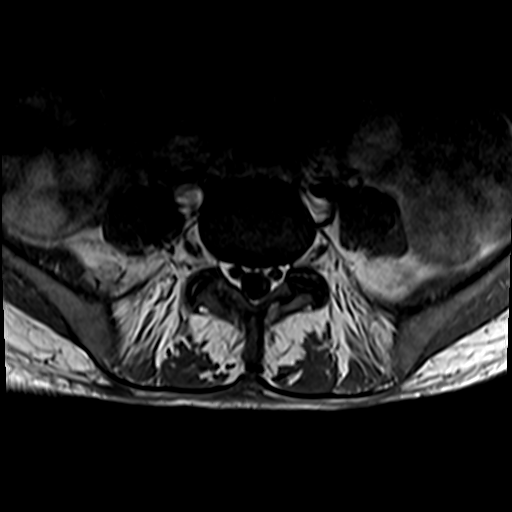
[im 11/35]
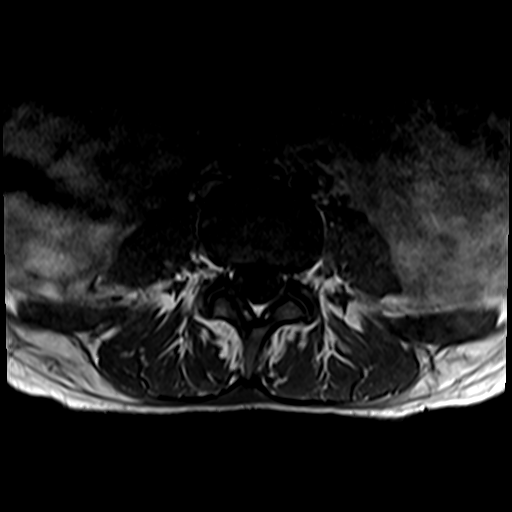
[im 16/35]
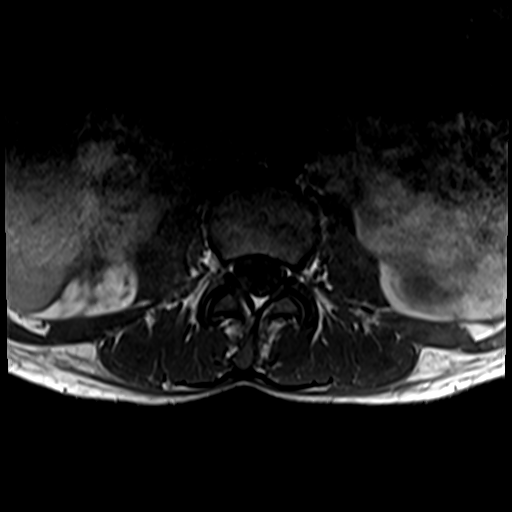
[im 19/35]
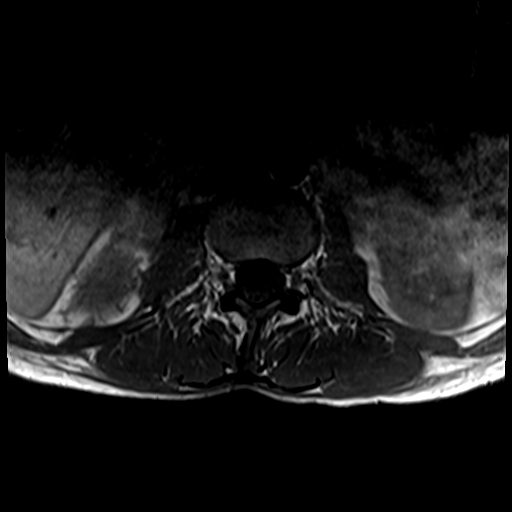
[im 24/35]
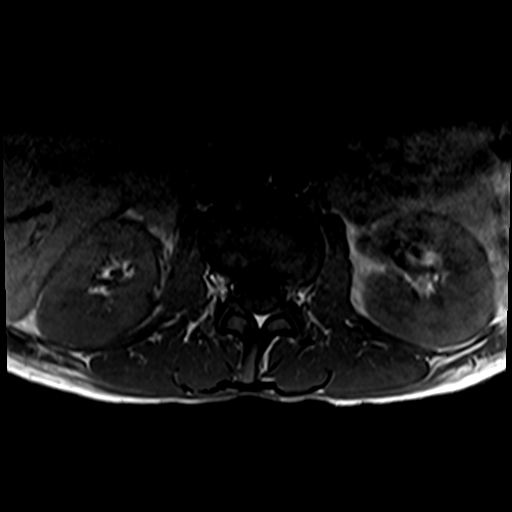
[im 29/35]
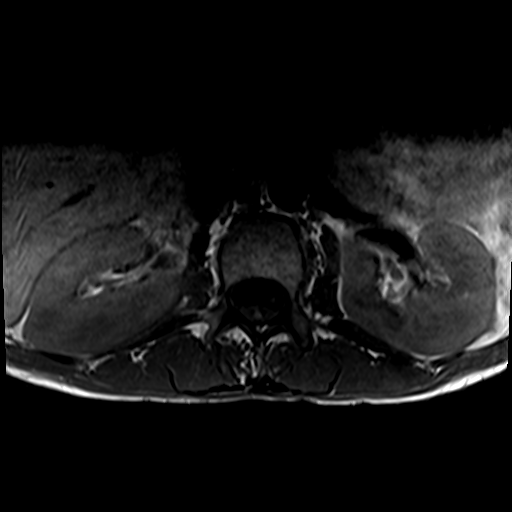
[im 35/35]
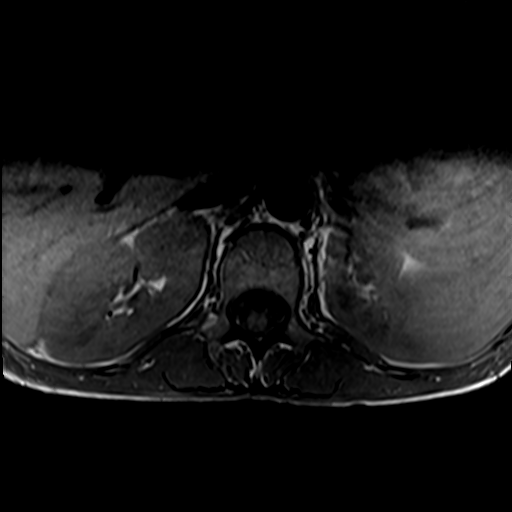

[31 of 48 positions shown; findings below may reference images not displayed]

FINDINGS: Segmentation:  Standard.

Alignment:  No significant listhesis.

Vertebrae: Vertebral body heights are maintained. No marrow edema.
No suspicious osseous lesion.

Conus medullaris and cauda equina: Conus extends to the L1-L2 level.
Conus and cauda equina appear normal.

Paraspinal and other soft tissues: Unremarkable.

Disc levels: Intervertebral disc heights and signal are maintained.
There is no disc herniation. No canal or foraminal stenosis.
IMPRESSION: Normal MRI of the lumbar spine.

## 2022-08-20 ENCOUNTER — Ambulatory Visit: Payer: Medicaid Other | Admitting: Nurse Practitioner

## 2022-08-20 ENCOUNTER — Telehealth (INDEPENDENT_AMBULATORY_CARE_PROVIDER_SITE_OTHER): Payer: Medicaid Other | Admitting: Nurse Practitioner

## 2022-08-20 DIAGNOSIS — F32A Depression, unspecified: Secondary | ICD-10-CM

## 2022-08-20 DIAGNOSIS — F902 Attention-deficit hyperactivity disorder, combined type: Secondary | ICD-10-CM

## 2022-08-20 DIAGNOSIS — F419 Anxiety disorder, unspecified: Secondary | ICD-10-CM

## 2022-08-21 NOTE — Progress Notes (Signed)
Rescheduled

## 2022-08-26 ENCOUNTER — Encounter: Payer: Self-pay | Admitting: Nurse Practitioner

## 2022-08-31 DIAGNOSIS — R519 Headache, unspecified: Secondary | ICD-10-CM | POA: Diagnosis not present

## 2022-08-31 DIAGNOSIS — Z5321 Procedure and treatment not carried out due to patient leaving prior to being seen by health care provider: Secondary | ICD-10-CM | POA: Diagnosis not present

## 2022-08-31 DIAGNOSIS — R5383 Other fatigue: Secondary | ICD-10-CM | POA: Diagnosis not present

## 2022-08-31 DIAGNOSIS — R079 Chest pain, unspecified: Secondary | ICD-10-CM | POA: Diagnosis not present

## 2022-08-31 DIAGNOSIS — R42 Dizziness and giddiness: Secondary | ICD-10-CM | POA: Diagnosis not present

## 2022-09-01 ENCOUNTER — Encounter: Payer: Self-pay | Admitting: Nurse Practitioner

## 2022-09-03 DIAGNOSIS — E559 Vitamin D deficiency, unspecified: Secondary | ICD-10-CM | POA: Diagnosis not present

## 2022-09-03 DIAGNOSIS — M129 Arthropathy, unspecified: Secondary | ICD-10-CM | POA: Diagnosis not present

## 2022-09-03 DIAGNOSIS — G894 Chronic pain syndrome: Secondary | ICD-10-CM | POA: Diagnosis not present

## 2022-09-03 DIAGNOSIS — Z79899 Other long term (current) drug therapy: Secondary | ICD-10-CM | POA: Diagnosis not present

## 2022-09-03 LAB — LAB REPORT - SCANNED: EGFR: 136

## 2022-09-06 DIAGNOSIS — F419 Anxiety disorder, unspecified: Secondary | ICD-10-CM | POA: Diagnosis not present

## 2022-09-06 DIAGNOSIS — Z681 Body mass index (BMI) 19 or less, adult: Secondary | ICD-10-CM | POA: Diagnosis not present

## 2022-09-06 DIAGNOSIS — R109 Unspecified abdominal pain: Secondary | ICD-10-CM | POA: Diagnosis not present

## 2022-09-06 DIAGNOSIS — F4310 Post-traumatic stress disorder, unspecified: Secondary | ICD-10-CM | POA: Diagnosis not present

## 2022-09-06 DIAGNOSIS — E43 Unspecified severe protein-calorie malnutrition: Secondary | ICD-10-CM | POA: Diagnosis not present

## 2022-09-06 DIAGNOSIS — G8929 Other chronic pain: Secondary | ICD-10-CM | POA: Diagnosis not present

## 2022-09-06 DIAGNOSIS — R300 Dysuria: Secondary | ICD-10-CM | POA: Diagnosis not present

## 2022-09-06 DIAGNOSIS — K551 Chronic vascular disorders of intestine: Secondary | ICD-10-CM | POA: Diagnosis not present

## 2022-09-06 DIAGNOSIS — R1031 Right lower quadrant pain: Secondary | ICD-10-CM | POA: Diagnosis not present

## 2022-09-06 DIAGNOSIS — F909 Attention-deficit hyperactivity disorder, unspecified type: Secondary | ICD-10-CM | POA: Diagnosis not present

## 2022-09-06 DIAGNOSIS — R6881 Early satiety: Secondary | ICD-10-CM | POA: Diagnosis not present

## 2022-09-06 DIAGNOSIS — I871 Compression of vein: Secondary | ICD-10-CM | POA: Diagnosis not present

## 2022-09-06 DIAGNOSIS — M62830 Muscle spasm of back: Secondary | ICD-10-CM | POA: Diagnosis not present

## 2022-09-06 DIAGNOSIS — R339 Retention of urine, unspecified: Secondary | ICD-10-CM | POA: Diagnosis not present

## 2022-09-06 DIAGNOSIS — R627 Adult failure to thrive: Secondary | ICD-10-CM | POA: Diagnosis not present

## 2022-09-07 DIAGNOSIS — R110 Nausea: Secondary | ICD-10-CM | POA: Diagnosis not present

## 2022-09-07 DIAGNOSIS — Z79899 Other long term (current) drug therapy: Secondary | ICD-10-CM | POA: Diagnosis not present

## 2022-09-07 DIAGNOSIS — R109 Unspecified abdominal pain: Secondary | ICD-10-CM | POA: Diagnosis not present

## 2022-09-07 DIAGNOSIS — R627 Adult failure to thrive: Secondary | ICD-10-CM | POA: Diagnosis not present

## 2022-09-07 DIAGNOSIS — G8929 Other chronic pain: Secondary | ICD-10-CM | POA: Diagnosis not present

## 2022-09-07 DIAGNOSIS — K551 Chronic vascular disorders of intestine: Secondary | ICD-10-CM | POA: Insufficient documentation

## 2022-09-07 DIAGNOSIS — R630 Anorexia: Secondary | ICD-10-CM | POA: Diagnosis not present

## 2022-09-07 DIAGNOSIS — Z9229 Personal history of other drug therapy: Secondary | ICD-10-CM | POA: Diagnosis not present

## 2022-09-07 DIAGNOSIS — R933 Abnormal findings on diagnostic imaging of other parts of digestive tract: Secondary | ICD-10-CM | POA: Diagnosis not present

## 2022-09-07 DIAGNOSIS — E43 Unspecified severe protein-calorie malnutrition: Secondary | ICD-10-CM | POA: Diagnosis not present

## 2022-09-08 DIAGNOSIS — R109 Unspecified abdominal pain: Secondary | ICD-10-CM | POA: Diagnosis not present

## 2022-09-08 DIAGNOSIS — Z9229 Personal history of other drug therapy: Secondary | ICD-10-CM | POA: Diagnosis not present

## 2022-09-08 DIAGNOSIS — Z8742 Personal history of other diseases of the female genital tract: Secondary | ICD-10-CM | POA: Diagnosis not present

## 2022-09-08 DIAGNOSIS — R110 Nausea: Secondary | ICD-10-CM | POA: Diagnosis not present

## 2022-09-08 DIAGNOSIS — Z681 Body mass index (BMI) 19 or less, adult: Secondary | ICD-10-CM | POA: Diagnosis not present

## 2022-09-08 DIAGNOSIS — R1031 Right lower quadrant pain: Secondary | ICD-10-CM | POA: Diagnosis not present

## 2022-09-08 DIAGNOSIS — G8929 Other chronic pain: Secondary | ICD-10-CM | POA: Diagnosis not present

## 2022-09-08 DIAGNOSIS — Z90710 Acquired absence of both cervix and uterus: Secondary | ICD-10-CM | POA: Diagnosis not present

## 2022-09-08 DIAGNOSIS — Z79899 Other long term (current) drug therapy: Secondary | ICD-10-CM | POA: Diagnosis not present

## 2022-09-08 DIAGNOSIS — R102 Pelvic and perineal pain: Secondary | ICD-10-CM | POA: Diagnosis not present

## 2022-09-08 DIAGNOSIS — R627 Adult failure to thrive: Secondary | ICD-10-CM | POA: Diagnosis not present

## 2022-09-08 DIAGNOSIS — R825 Elevated urine levels of drugs, medicaments and biological substances: Secondary | ICD-10-CM | POA: Diagnosis not present

## 2022-09-08 DIAGNOSIS — E43 Unspecified severe protein-calorie malnutrition: Secondary | ICD-10-CM | POA: Diagnosis not present

## 2022-09-09 ENCOUNTER — Ambulatory Visit: Payer: Medicaid Other

## 2022-09-09 ENCOUNTER — Encounter: Payer: Self-pay | Admitting: Nurse Practitioner

## 2022-09-09 DIAGNOSIS — R110 Nausea: Secondary | ICD-10-CM | POA: Diagnosis not present

## 2022-09-09 DIAGNOSIS — R634 Abnormal weight loss: Secondary | ICD-10-CM | POA: Diagnosis not present

## 2022-09-09 DIAGNOSIS — F419 Anxiety disorder, unspecified: Secondary | ICD-10-CM | POA: Diagnosis not present

## 2022-09-09 DIAGNOSIS — E43 Unspecified severe protein-calorie malnutrition: Secondary | ICD-10-CM | POA: Diagnosis not present

## 2022-09-09 DIAGNOSIS — R6881 Early satiety: Secondary | ICD-10-CM | POA: Diagnosis not present

## 2022-09-09 DIAGNOSIS — R109 Unspecified abdominal pain: Secondary | ICD-10-CM | POA: Diagnosis not present

## 2022-09-09 DIAGNOSIS — G8929 Other chronic pain: Secondary | ICD-10-CM | POA: Diagnosis not present

## 2022-09-09 DIAGNOSIS — K219 Gastro-esophageal reflux disease without esophagitis: Secondary | ICD-10-CM | POA: Diagnosis not present

## 2022-09-09 DIAGNOSIS — R1031 Right lower quadrant pain: Secondary | ICD-10-CM | POA: Diagnosis not present

## 2022-09-09 DIAGNOSIS — K224 Dyskinesia of esophagus: Secondary | ICD-10-CM | POA: Diagnosis not present

## 2022-09-09 DIAGNOSIS — R1032 Left lower quadrant pain: Secondary | ICD-10-CM | POA: Diagnosis not present
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
## 2022-09-10 DIAGNOSIS — R627 Adult failure to thrive: Secondary | ICD-10-CM | POA: Diagnosis not present

## 2022-09-10 DIAGNOSIS — R1031 Right lower quadrant pain: Secondary | ICD-10-CM | POA: Diagnosis not present

## 2022-09-10 DIAGNOSIS — E43 Unspecified severe protein-calorie malnutrition: Secondary | ICD-10-CM | POA: Diagnosis not present

## 2022-09-10 DIAGNOSIS — F419 Anxiety disorder, unspecified: Secondary | ICD-10-CM | POA: Diagnosis not present

## 2022-09-10 DIAGNOSIS — Z79899 Other long term (current) drug therapy: Secondary | ICD-10-CM | POA: Diagnosis not present

## 2022-09-10 DIAGNOSIS — K551 Chronic vascular disorders of intestine: Secondary | ICD-10-CM | POA: Diagnosis not present

## 2022-09-10 DIAGNOSIS — R6339 Other feeding difficulties: Secondary | ICD-10-CM | POA: Diagnosis not present

## 2022-09-10 DIAGNOSIS — R110 Nausea: Secondary | ICD-10-CM | POA: Diagnosis not present

## 2022-09-10 DIAGNOSIS — Z792 Long term (current) use of antibiotics: Secondary | ICD-10-CM | POA: Diagnosis not present

## 2022-09-10 DIAGNOSIS — G129 Spinal muscular atrophy, unspecified: Secondary | ICD-10-CM | POA: Diagnosis not present

## 2022-09-10 DIAGNOSIS — G8929 Other chronic pain: Secondary | ICD-10-CM | POA: Diagnosis not present

## 2022-09-10 DIAGNOSIS — R1032 Left lower quadrant pain: Secondary | ICD-10-CM | POA: Diagnosis not present

## 2022-09-10 DIAGNOSIS — R634 Abnormal weight loss: Secondary | ICD-10-CM | POA: Diagnosis not present

## 2022-09-11 DIAGNOSIS — Z792 Long term (current) use of antibiotics: Secondary | ICD-10-CM | POA: Diagnosis not present

## 2022-09-11 DIAGNOSIS — G8929 Other chronic pain: Secondary | ICD-10-CM | POA: Diagnosis not present

## 2022-09-11 DIAGNOSIS — G129 Spinal muscular atrophy, unspecified: Secondary | ICD-10-CM | POA: Diagnosis not present

## 2022-09-11 DIAGNOSIS — Z79899 Other long term (current) drug therapy: Secondary | ICD-10-CM | POA: Diagnosis not present

## 2022-09-11 DIAGNOSIS — R109 Unspecified abdominal pain: Secondary | ICD-10-CM | POA: Diagnosis not present

## 2022-09-11 DIAGNOSIS — F419 Anxiety disorder, unspecified: Secondary | ICD-10-CM | POA: Diagnosis not present

## 2022-09-11 DIAGNOSIS — E43 Unspecified severe protein-calorie malnutrition: Secondary | ICD-10-CM | POA: Diagnosis not present

## 2022-09-12 DIAGNOSIS — R197 Diarrhea, unspecified: Secondary | ICD-10-CM | POA: Diagnosis not present

## 2022-09-12 DIAGNOSIS — R109 Unspecified abdominal pain: Secondary | ICD-10-CM | POA: Diagnosis not present

## 2022-09-12 DIAGNOSIS — E43 Unspecified severe protein-calorie malnutrition: Secondary | ICD-10-CM | POA: Diagnosis not present

## 2022-09-12 DIAGNOSIS — Z681 Body mass index (BMI) 19 or less, adult: Secondary | ICD-10-CM | POA: Diagnosis not present

## 2022-09-12 DIAGNOSIS — G8929 Other chronic pain: Secondary | ICD-10-CM | POA: Diagnosis not present

## 2022-09-12 DIAGNOSIS — K551 Chronic vascular disorders of intestine: Secondary | ICD-10-CM | POA: Diagnosis not present

## 2022-09-12 DIAGNOSIS — N200 Calculus of kidney: Secondary | ICD-10-CM | POA: Diagnosis not present

## 2022-09-13 DIAGNOSIS — E43 Unspecified severe protein-calorie malnutrition: Secondary | ICD-10-CM | POA: Diagnosis not present

## 2022-09-13 DIAGNOSIS — R300 Dysuria: Secondary | ICD-10-CM | POA: Diagnosis not present

## 2022-09-13 DIAGNOSIS — K551 Chronic vascular disorders of intestine: Secondary | ICD-10-CM | POA: Diagnosis not present

## 2022-09-13 DIAGNOSIS — Z79899 Other long term (current) drug therapy: Secondary | ICD-10-CM | POA: Diagnosis not present

## 2022-09-13 DIAGNOSIS — R140 Abdominal distension (gaseous): Secondary | ICD-10-CM | POA: Diagnosis not present

## 2022-09-13 DIAGNOSIS — G8929 Other chronic pain: Secondary | ICD-10-CM | POA: Diagnosis not present

## 2022-09-13 DIAGNOSIS — Z681 Body mass index (BMI) 19 or less, adult: Secondary | ICD-10-CM | POA: Diagnosis not present

## 2022-09-13 DIAGNOSIS — R109 Unspecified abdominal pain: Secondary | ICD-10-CM | POA: Diagnosis not present

## 2022-09-13 DIAGNOSIS — Z419 Encounter for procedure for purposes other than remedying health state, unspecified: Secondary | ICD-10-CM | POA: Diagnosis not present

## 2022-09-14 DIAGNOSIS — R109 Unspecified abdominal pain: Secondary | ICD-10-CM | POA: Diagnosis not present

## 2022-09-14 DIAGNOSIS — Z681 Body mass index (BMI) 19 or less, adult: Secondary | ICD-10-CM | POA: Diagnosis not present

## 2022-09-14 DIAGNOSIS — K3189 Other diseases of stomach and duodenum: Secondary | ICD-10-CM | POA: Diagnosis not present

## 2022-09-14 DIAGNOSIS — R6339 Other feeding difficulties: Secondary | ICD-10-CM | POA: Diagnosis not present

## 2022-09-14 DIAGNOSIS — E43 Unspecified severe protein-calorie malnutrition: Secondary | ICD-10-CM | POA: Diagnosis not present

## 2022-09-14 DIAGNOSIS — Z9049 Acquired absence of other specified parts of digestive tract: Secondary | ICD-10-CM | POA: Diagnosis not present

## 2022-09-14 DIAGNOSIS — G8918 Other acute postprocedural pain: Secondary | ICD-10-CM | POA: Diagnosis not present

## 2022-09-14 DIAGNOSIS — G129 Spinal muscular atrophy, unspecified: Secondary | ICD-10-CM | POA: Diagnosis not present

## 2022-09-14 DIAGNOSIS — R933 Abnormal findings on diagnostic imaging of other parts of digestive tract: Secondary | ICD-10-CM | POA: Diagnosis not present

## 2022-09-14 DIAGNOSIS — G8929 Other chronic pain: Secondary | ICD-10-CM | POA: Diagnosis not present

## 2022-09-15 ENCOUNTER — Encounter: Payer: Self-pay | Admitting: Internal Medicine

## 2022-09-15 DIAGNOSIS — Z79891 Long term (current) use of opiate analgesic: Secondary | ICD-10-CM | POA: Diagnosis not present

## 2022-09-15 DIAGNOSIS — R109 Unspecified abdominal pain: Secondary | ICD-10-CM | POA: Diagnosis not present

## 2022-09-15 DIAGNOSIS — E43 Unspecified severe protein-calorie malnutrition: Secondary | ICD-10-CM | POA: Diagnosis not present

## 2022-09-15 DIAGNOSIS — K3189 Other diseases of stomach and duodenum: Secondary | ICD-10-CM | POA: Diagnosis not present

## 2022-09-15 DIAGNOSIS — R300 Dysuria: Secondary | ICD-10-CM | POA: Diagnosis not present

## 2022-09-15 DIAGNOSIS — R634 Abnormal weight loss: Secondary | ICD-10-CM | POA: Diagnosis not present

## 2022-09-15 DIAGNOSIS — Z9889 Other specified postprocedural states: Secondary | ICD-10-CM | POA: Diagnosis not present

## 2022-09-15 DIAGNOSIS — Z4682 Encounter for fitting and adjustment of non-vascular catheter: Secondary | ICD-10-CM | POA: Diagnosis not present

## 2022-09-15 DIAGNOSIS — R627 Adult failure to thrive: Secondary | ICD-10-CM | POA: Diagnosis not present

## 2022-09-15 DIAGNOSIS — Z978 Presence of other specified devices: Secondary | ICD-10-CM | POA: Diagnosis not present

## 2022-09-15 DIAGNOSIS — G129 Spinal muscular atrophy, unspecified: Secondary | ICD-10-CM | POA: Diagnosis not present

## 2022-09-15 DIAGNOSIS — M8580 Other specified disorders of bone density and structure, unspecified site: Secondary | ICD-10-CM | POA: Diagnosis not present

## 2022-09-15 DIAGNOSIS — Z79899 Other long term (current) drug therapy: Secondary | ICD-10-CM | POA: Diagnosis not present

## 2022-09-16 DIAGNOSIS — R109 Unspecified abdominal pain: Secondary | ICD-10-CM | POA: Diagnosis not present

## 2022-09-16 DIAGNOSIS — R300 Dysuria: Secondary | ICD-10-CM | POA: Diagnosis not present

## 2022-09-16 DIAGNOSIS — E43 Unspecified severe protein-calorie malnutrition: Secondary | ICD-10-CM | POA: Diagnosis not present

## 2022-09-16 DIAGNOSIS — G8929 Other chronic pain: Secondary | ICD-10-CM | POA: Diagnosis not present

## 2022-09-17 ENCOUNTER — Ambulatory Visit: Payer: Medicaid Other | Admitting: Internal Medicine

## 2022-09-17 DIAGNOSIS — E43 Unspecified severe protein-calorie malnutrition: Secondary | ICD-10-CM | POA: Diagnosis not present

## 2022-09-17 DIAGNOSIS — G8929 Other chronic pain: Secondary | ICD-10-CM | POA: Diagnosis not present

## 2022-09-17 DIAGNOSIS — R109 Unspecified abdominal pain: Secondary | ICD-10-CM | POA: Diagnosis not present

## 2022-09-17 DIAGNOSIS — R300 Dysuria: Secondary | ICD-10-CM | POA: Diagnosis not present

## 2022-09-18 DIAGNOSIS — F419 Anxiety disorder, unspecified: Secondary | ICD-10-CM | POA: Diagnosis not present

## 2022-09-18 DIAGNOSIS — G8929 Other chronic pain: Secondary | ICD-10-CM | POA: Diagnosis not present

## 2022-09-18 DIAGNOSIS — E43 Unspecified severe protein-calorie malnutrition: Secondary | ICD-10-CM | POA: Diagnosis not present

## 2022-09-18 DIAGNOSIS — R109 Unspecified abdominal pain: Secondary | ICD-10-CM | POA: Diagnosis not present

## 2022-09-19 DIAGNOSIS — F419 Anxiety disorder, unspecified: Secondary | ICD-10-CM | POA: Diagnosis not present

## 2022-09-19 DIAGNOSIS — R109 Unspecified abdominal pain: Secondary | ICD-10-CM | POA: Diagnosis not present

## 2022-09-19 DIAGNOSIS — Z452 Encounter for adjustment and management of vascular access device: Secondary | ICD-10-CM | POA: Diagnosis not present

## 2022-09-19 DIAGNOSIS — E43 Unspecified severe protein-calorie malnutrition: Secondary | ICD-10-CM | POA: Diagnosis not present

## 2022-09-19 DIAGNOSIS — F909 Attention-deficit hyperactivity disorder, unspecified type: Secondary | ICD-10-CM | POA: Diagnosis not present

## 2022-09-20 DIAGNOSIS — Z452 Encounter for adjustment and management of vascular access device: Secondary | ICD-10-CM | POA: Diagnosis not present

## 2022-09-20 DIAGNOSIS — R102 Pelvic and perineal pain: Secondary | ICD-10-CM | POA: Diagnosis not present

## 2022-09-20 DIAGNOSIS — F419 Anxiety disorder, unspecified: Secondary | ICD-10-CM | POA: Diagnosis not present

## 2022-09-20 DIAGNOSIS — R109 Unspecified abdominal pain: Secondary | ICD-10-CM | POA: Diagnosis not present

## 2022-09-20 DIAGNOSIS — E43 Unspecified severe protein-calorie malnutrition: Secondary | ICD-10-CM | POA: Diagnosis not present

## 2022-09-20 DIAGNOSIS — Z4682 Encounter for fitting and adjustment of non-vascular catheter: Secondary | ICD-10-CM | POA: Diagnosis not present

## 2022-09-20 DIAGNOSIS — G8929 Other chronic pain: Secondary | ICD-10-CM | POA: Diagnosis not present

## 2022-09-21 DIAGNOSIS — F419 Anxiety disorder, unspecified: Secondary | ICD-10-CM | POA: Diagnosis not present

## 2022-09-21 DIAGNOSIS — R102 Pelvic and perineal pain: Secondary | ICD-10-CM | POA: Diagnosis not present

## 2022-09-21 DIAGNOSIS — F5001 Anorexia nervosa, restricting type: Secondary | ICD-10-CM | POA: Diagnosis not present

## 2022-09-21 DIAGNOSIS — R109 Unspecified abdominal pain: Secondary | ICD-10-CM | POA: Diagnosis not present

## 2022-09-21 DIAGNOSIS — K551 Chronic vascular disorders of intestine: Secondary | ICD-10-CM | POA: Diagnosis not present

## 2022-09-21 DIAGNOSIS — G8929 Other chronic pain: Secondary | ICD-10-CM | POA: Diagnosis not present

## 2022-09-21 DIAGNOSIS — E43 Unspecified severe protein-calorie malnutrition: Secondary | ICD-10-CM | POA: Diagnosis not present

## 2022-09-22 DIAGNOSIS — R6339 Other feeding difficulties: Secondary | ICD-10-CM | POA: Diagnosis not present

## 2022-09-22 DIAGNOSIS — R140 Abdominal distension (gaseous): Secondary | ICD-10-CM | POA: Diagnosis not present

## 2022-09-22 DIAGNOSIS — K551 Chronic vascular disorders of intestine: Secondary | ICD-10-CM | POA: Diagnosis not present

## 2022-09-22 DIAGNOSIS — R109 Unspecified abdominal pain: Secondary | ICD-10-CM | POA: Diagnosis not present

## 2022-09-22 DIAGNOSIS — R627 Adult failure to thrive: Secondary | ICD-10-CM | POA: Diagnosis not present

## 2022-09-22 DIAGNOSIS — R634 Abnormal weight loss: Secondary | ICD-10-CM | POA: Diagnosis not present

## 2022-09-22 DIAGNOSIS — F419 Anxiety disorder, unspecified: Secondary | ICD-10-CM | POA: Diagnosis not present

## 2022-09-22 DIAGNOSIS — R102 Pelvic and perineal pain: Secondary | ICD-10-CM | POA: Diagnosis not present

## 2022-09-22 DIAGNOSIS — G8929 Other chronic pain: Secondary | ICD-10-CM | POA: Diagnosis not present

## 2022-09-22 DIAGNOSIS — E43 Unspecified severe protein-calorie malnutrition: Secondary | ICD-10-CM | POA: Diagnosis not present

## 2022-09-23 ENCOUNTER — Telehealth (INDEPENDENT_AMBULATORY_CARE_PROVIDER_SITE_OTHER): Payer: Medicaid Other | Admitting: Nurse Practitioner

## 2022-09-23 ENCOUNTER — Encounter: Payer: Self-pay | Admitting: Nurse Practitioner

## 2022-09-23 DIAGNOSIS — R109 Unspecified abdominal pain: Secondary | ICD-10-CM | POA: Diagnosis not present

## 2022-09-23 DIAGNOSIS — E43 Unspecified severe protein-calorie malnutrition: Secondary | ICD-10-CM | POA: Diagnosis not present

## 2022-09-23 DIAGNOSIS — K551 Chronic vascular disorders of intestine: Secondary | ICD-10-CM

## 2022-09-23 DIAGNOSIS — F419 Anxiety disorder, unspecified: Secondary | ICD-10-CM | POA: Diagnosis not present

## 2022-09-23 DIAGNOSIS — G8929 Other chronic pain: Secondary | ICD-10-CM | POA: Diagnosis not present

## 2022-09-23 NOTE — Assessment & Plan Note (Signed)
Currently admitted to [facility] for this with tube feedings, also noted on [facility] diagnosis list anorexia nervosa restricting type (patient is frustrated by this being in chart and disagrees with it).  Recommend that she continue care in hospital as is getting tube feeds to assist with weight gain and getting multiple medications for pain and anxiety.  Discussed with her for any in hospital concerns she can reach out to patient relations who would be her advocate in hospital and can address any concerns she may have, that PCP does not have any privileges within hospital setting.  Offered empathetic listening during visit.

## 2022-09-23 NOTE — Patient Instructions (Signed)
Abdominal Pain, Adult Many things can cause belly (abdominal) pain. Most times, belly pain is not dangerous. Many cases of belly pain can be watched and treated at home. Sometimes, though, belly pain is serious. Your doctor will try to find the cause of your belly pain. Follow these instructions at home:  Medicines Take over-the-counter and prescription medicines only as told by your doctor. Do not take medicines that help you poop (laxatives) unless told by your doctor. General instructions Watch your belly pain for any changes. Drink enough fluid to keep your pee (urine) pale yellow. Keep all follow-up visits as told by your doctor. This is important. Contact a doctor if: Your belly pain changes or gets worse. You are not hungry, or you lose weight without trying. You are having trouble pooping (constipated) or have watery poop (diarrhea) for more than 2-3 days. You have pain when you pee or poop. Your belly pain wakes you up at night. Your pain gets worse with meals, after eating, or with certain foods. You are vomiting and cannot keep anything down. You have a fever. You have blood in your pee. Get help right away if: Your pain does not go away as soon as your doctor says it should. You cannot stop vomiting. Your pain is only in areas of your belly, such as the right side or the left lower part of the belly. You have bloody or black poop, or poop that looks like tar. You have very bad pain, cramping, or bloating in your belly. You have signs of not having enough fluid or water in your body (dehydration), such as: Dark pee, very little pee, or no pee. Cracked lips. Dry mouth. Sunken eyes. Sleepiness. Weakness. You have trouble breathing or chest pain. Summary Many cases of belly pain can be watched and treated at home. Watch your belly pain for any changes. Take over-the-counter and prescription medicines only as told by your doctor. Contact a doctor if your belly pain  changes or gets worse. Get help right away if you have very bad pain, cramping, or bloating in your belly. This information is not intended to replace advice given to you by your health care provider. Make sure you discuss any questions you have with your health care provider. Document Revised: 10/09/2018 Document Reviewed: 10/09/2018 Elsevier Patient Education  2023 [employer]  

## 2022-09-23 NOTE — Progress Notes (Signed)
LMP 06/11/2021 (Approximate)    Subjective:    Patient ID: Megan Bentley, female    DOB: Apr 12, 1986, 36 y.o.   MRN: [id number]  HPI: Megan Bentley is a 37 y.o. female  Chief Complaint  Patient presents with   Abdominal Pain   This visit was completed via video visit through MyChart due to the restrictions of the COVID-19 pandemic. All issues as above were discussed and addressed. Physical exam was done as above through visual confirmation on video through MyChart. If it was felt that the patient should be evaluated in the office, they were directed there. The patient verbally consented to this visit. Location of the patient:  [facility] hospital Location of the provider: work Those involved with this call:  Provider: Aura Dials, DNP CMA: Tristan Schroeder, CMA Front Desk/Registration: Ozella Almond  Time spent on call:  21 minutes with patient face to face via video conference. More than 50% of this time was spent in counseling and coordination of care. 15 minutes total spent in review of patient's record and preparation of their chart. I verified patient identity using two factors (patient name and date of birth). Patient consents verbally to being seen via telemedicine visit today.     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  She is receiving tube feeds.  She is frustrated, as they are putting she has anorexia nervosa in her notes.  She reports she does not have anorexia, that she likes to eat but "just do not have appetite".  They tried taking her feeding tube out in hospital and symptoms returned, so she continues tube feeds.  She feels that she has lupus underlying and that this is correlated with her renal narrowing too on past imaging.  Her ANA was negative at [facility].  She was followed by rheumatology outpatient, with last visit 07/07/22.  Currently is taking  Lyrica in hospital, they stopped her Gabapentin -- she feels Lyrica works better.  In hospital they have discontinued Klonopin -- but have changed to Ativan 1 MG TID.  Getting multiple supplements and Bentyl in hospital for GI symptoms + Buprenorphine patch and pain medication every 6 hours for pain.    She is being followed by GI at [facility] + psychiatry.  She reports they wanted to start antidepressant, but she tried Cymbalta in past and this gave her SI, so she does not want an antidepressant.    She would like to transfer care to [facility] because her father and aunt see specialists there -- aunt and father have hereditary spastic paraplegia. Duration:months Status: fluctuating Fever: no Nausea: yes Vomiting: yes Weight loss: yes Decreased appetite: yes Diarrhea: no Constipation: no Rash: yes Dysuria/urinary frequency: no Hematuria: no History of sexually transmitted disease: no Recurrent NSAID use: no   Relevant past medical, surgical, family and social history reviewed and updated as indicated. Interim medical history since our last visit reviewed. Allergies and medications reviewed and updated.  Review of Systems  Constitutional:  Positive for appetite change and unexpected weight change. Negative for activity change, diaphoresis, fatigue and fever.  Respiratory:  Negative for cough, chest tightness and shortness of breath.   Cardiovascular:  Negative for chest pain, palpitations and leg swelling.  Gastrointestinal:  Positive for abdominal pain and nausea. Negative for abdominal distention, constipation, diarrhea and vomiting.  Neurological: Negative.   Psychiatric/Behavioral: Negative.     Per HPI unless specifically indicated above  Objective:    LMP 06/11/2021 (Approximate)   Wt Readings from Last 3 Encounters:  07/23/22 93 lb (42.2 kg)  07/22/22 96 lb (43.5 kg)  07/09/22 93 lb 8 oz (42.4 kg)    Physical Exam Vitals and nursing note reviewed.  Constitutional:       General: She is awake. She is not in acute distress.    Appearance: She is well-developed and underweight. She is not ill-appearing or toxic-appearing.  HENT:     Head: Normocephalic.     Right Ear: Hearing normal.     Left Ear: Hearing normal.     Nose:     Comments: Feeding tube in place. Eyes:     General: Lids are normal.        Right eye: No discharge.        Left eye: No discharge.     Conjunctiva/sclera: Conjunctivae normal.  Pulmonary:     Effort: Pulmonary effort is normal. No accessory muscle usage or respiratory distress.  Musculoskeletal:     Cervical back: Normal range of motion.  Neurological:     Mental Status: She is alert and oriented to person, place, and time.  Psychiatric:        Attention and Perception: Attention normal.        Mood and Affect: Mood normal.        Behavior: Behavior normal. Behavior is cooperative.        Thought Content: Thought content normal.        Judgment: Judgment normal.    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  Recommend that she continue care in hospital as is getting tube feeds to assist with weight gain and getting multiple medications for pain and anxiety.  Discussed with her for any in hospital concerns she can reach out to patient relations who would be her advocate in hospital and can address any concerns she may have, that PCP does not have any privileges within hospital setting.  Offered empathetic listening during visit.       I discussed the assessment and treatment plan with the patient. The patient was  provided an opportunity to ask questions and all were answered. The patient agreed with the plan and demonstrated an understanding of the instructions.   The patient was advised to call back or seek an in-person evaluation if the symptoms worsen or if the condition fails to improve as anticipated.   I provided 21+ minutes of time during this encounter.    Follow up plan: Return if symptoms worsen or fail to improve.

## 2022-09-24 ENCOUNTER — Telehealth: Payer: Self-pay

## 2022-09-24 ENCOUNTER — Encounter: Payer: Self-pay | Admitting: Nurse Practitioner

## 2022-09-24 NOTE — Progress Notes (Signed)
Spoke to [facility] Provider Konrad Saha on phone today, 09/24/22, about case.  Patient has been discharged from [facility] and [facility] reports patient was dissatisfied with care there, that patient feels she has autoimmune disease, whoever her case appears to be more SMAS.  Feeding tube was removed prior to her leaving the hospital.  Was taking Oxycodone and Klonopin while in hospital.  She reports the patient had told them she was going to go to [facility] today for further assessment since her father and aunt's provider is there.  However, [facility] Endo reports it looks like patient is now back in [place] ER due to concerns for feeding tube damage.  She reports patient wanted to go home because she missed her children.

## 2022-09-24 NOTE — Telephone Encounter (Signed)
Copied from CRM [id number]. Topic: General - Other >> Sep 23, 2022  3:49 PM Everette C wrote: Reason for CRM: kelly stepanek with [facility] would like to speak with the patient's PCP when possible regarding the patient's telehealth appointment   Please contact at [phone]

## 2022-09-24 NOTE — Telephone Encounter (Signed)
Noted and spoke to [facility] provider.

## 2022-09-24 NOTE — Transitions of Care (Post Inpatient/ED Visit) (Signed)
   09/24/2022  Name: Megan Bentley MRN: [id number] DOB: February 01, 1986  Today's TOC FU Call Status: Today's TOC FU Call Status:: Successful TOC FU Call Competed TOC FU Call Complete Date: 09/24/22  Transition Care Management Follow-up Telephone Call Date of Discharge: 09/23/22 Discharge Facility: Other ([facility]) Name of Other ([facility]) Discharge Facility: [facility] Type of Discharge: Inpatient Admission Primary Inpatient Discharge Diagnosis:: abd pain How have you been since you were released from the hospital?: Same Any questions or concerns?: No  Items Reviewed: Did you receive and understand the discharge instructions provided?: Yes Medications obtained and verified?: Yes (Medications Reviewed) Any new allergies since your discharge?: No Dietary orders reviewed?: Yes Do you have support at home?: Yes People in Home: parent(s)  Home Care and Equipment/Supplies: Were Home Health Services Ordered?: NA Any new equipment or medical supplies ordered?: NA  Functional Questionnaire: Do you need assistance with bathing/showering or dressing?: No Do you need assistance with meal preparation?: No Do you need assistance with eating?: No Do you have difficulty maintaining continence: No Do you need assistance with getting out of bed/getting out of a chair/moving?: No Do you have difficulty managing or taking your medications?: No  Follow up appointments reviewed: PCP Follow-up appointment confirmed?: No Specialist Hospital Follow-up appointment confirmed?: NA Do you need transportation to your follow-up appointment?: No Do you understand care options if your condition(s) worsen?: Yes-patient verbalized understanding    SIGNATURE Karena Addison, LPN [facility] Nurse Health Advisor Direct Dial [phone]

## 2022-09-27 DIAGNOSIS — G8929 Other chronic pain: Secondary | ICD-10-CM | POA: Diagnosis not present

## 2022-09-27 DIAGNOSIS — R1084 Generalized abdominal pain: Secondary | ICD-10-CM | POA: Diagnosis not present

## 2022-09-27 DIAGNOSIS — R195 Other fecal abnormalities: Secondary | ICD-10-CM | POA: Diagnosis not present

## 2022-09-27 DIAGNOSIS — R110 Nausea: Secondary | ICD-10-CM | POA: Diagnosis not present

## 2022-09-27 DIAGNOSIS — Z87891 Personal history of nicotine dependence: Secondary | ICD-10-CM | POA: Diagnosis not present

## 2022-09-27 DIAGNOSIS — I7300 Raynaud's syndrome without gangrene: Secondary | ICD-10-CM | POA: Diagnosis not present

## 2022-09-28 ENCOUNTER — Encounter: Payer: Self-pay | Admitting: Internal Medicine

## 2022-09-28 ENCOUNTER — Other Ambulatory Visit: Payer: Self-pay | Admitting: Nurse Practitioner

## 2022-09-28 DIAGNOSIS — R1084 Generalized abdominal pain: Secondary | ICD-10-CM | POA: Diagnosis not present

## 2022-09-28 DIAGNOSIS — G43109 Migraine with aura, not intractable, without status migrainosus: Secondary | ICD-10-CM | POA: Diagnosis not present

## 2022-09-29 ENCOUNTER — Telehealth: Payer: Self-pay

## 2022-09-29 NOTE — Telephone Encounter (Signed)
Requested medication (s) are due for refill today: yes  Requested medication (s) are on the active medication list: yes  Last refill:  07/22/22  Future visit scheduled: yes  Notes to clinic:  Unable to refill per protocol, cannot delegate.      Requested Prescriptions  Pending Prescriptions Disp Refills   ondansetron (ZOFRAN) 4 MG tablet [Pharmacy Med Name: ONDANSETRON HCL 4 MG TABLET] 45 tablet 0    Sig: TAKE 1 TABLET BY MOUTH EVERY 8 HOURS AS NEEDED FOR NAUSEA AND VOMITING     Not Delegated - Gastroenterology: Antiemetics - ondansetron Failed - 09/28/2022  3:55 PM      Failed - This refill cannot be delegated      Failed - AST in normal range and within 360 days    AST  Date Value Ref Range Status  06/30/2022 13 (L) 15 - 41 U/L Final   SGOT(AST)  Date Value Ref Range Status  04/16/2013 16 15 - 37 Unit/L Final         Passed - ALT in normal range and within 360 days    ALT  Date Value Ref Range Status  06/30/2022 12 0 - 44 U/L Final   SGPT (ALT)  Date Value Ref Range Status  04/16/2013 21 12 - 78 U/L Final         Passed - Valid encounter within last 6 months    Recent Outpatient Visits           6 days ago SMAS (superior mesenteric artery syndrome)   [place] [facility] [place], Dorie Rank, NP   1 month ago Anxiety and depression   [place] [facility] [place], [place] T, NP   1 month ago Chronic pain syndrome   [place] [facility] [place], [place] T, NP   2 months ago Anxiety and depression   [place] [facility] [place], [place] T, NP   3 months ago Acute cystitis with hematuria   [place] [facility] [place], Oralia Rud, DO       Future Appointments             In 9 months [place], [address] [facility] [place]

## 2022-09-29 NOTE — Telephone Encounter (Signed)
Patient sent a mychart message for a appointment: she states this in the message:  Please get me in asap I was at [facility] for 19 days with SMA syndrome I feel myself getting sicker and sicker again. No vomiting just pain in my abdomen and generally feeling unwell need to schedule that colonoscopy again.     Please advise what you recommend

## 2022-09-29 NOTE — Telephone Encounter (Signed)
Megan Bentley  Please go ahead and schedule colonoscopy with 2-day prep  RV

## 2022-09-30 ENCOUNTER — Encounter: Payer: Self-pay | Admitting: Gastroenterology

## 2022-09-30 ENCOUNTER — Other Ambulatory Visit: Payer: Medicaid Other

## 2022-09-30 DIAGNOSIS — R1084 Generalized abdominal pain: Secondary | ICD-10-CM

## 2022-09-30 DIAGNOSIS — R112 Nausea with vomiting, unspecified: Secondary | ICD-10-CM

## 2022-09-30 MED ORDER — NA SULFATE-K SULFATE-MG SULF 17.5-3.13-1.6 GM/177ML PO SOLN: 354.0000 mL | Freq: Once | ORAL | 0 refills | Status: AC

## 2022-09-30 NOTE — Addendum Note (Signed)
Addended by: Radene Knee L on: 09/30/2022 10:21 AM   Modules accepted: Orders

## 2022-09-30 NOTE — Telephone Encounter (Signed)
Patient thank [place] so much for scheduling the colonoscopy for her. She states she wants to do this as soon as possible. Schedule her for 10/07/2022 [place]. Went over instructions for patient and sent to mychart account. Sent prep to the pharmacy

## 2022-09-30 NOTE — Anesthesia Preprocedure Evaluation (Addendum)
Anesthesia Evaluation  Patient identified by MRN, date of birth, ID band Patient awake    Reviewed: Allergy & Precautions, H&P , NPO status , Patient's Chart, lab work & pertinent test results  History of Anesthesia Complications (+) PONV and history of anesthetic complications  Airway Mallampati: II  TM Distance: >3 FB Neck ROM: Full    Dental no notable dental hx.    Pulmonary pneumonia, former smoker   Pulmonary exam normal breath sounds clear to auscultation       Cardiovascular hypertension, Normal cardiovascular exam Rhythm:Regular Rate:Normal     Neuro/Psych  Headaches, Seizures -,  PSYCHIATRIC DISORDERS Anxiety Depression    Patient extremely anxious and crying, states she is "going through a lot right now" PTSD Panic attacks "Seizure-like activity" ADHD"Seizure like activity"     GI/Hepatic negative GI ROS,,,(+) Hepatitis -  Endo/Other  negative endocrine ROS    Renal/GU Renal disease  negative genitourinary   Musculoskeletal negative musculoskeletal ROS (+) Arthritis ,    Abdominal   Peds negative pediatric ROS (+)  Hematology  (+) Blood dyscrasia, anemia   Anesthesia Other Findings Migraines  Anxiety Renal disorder Kidney stone Arthritis  Reynolds syndrome???? Possibly Raynauds,  Allergy Depression Preeclampsia  Pneumonia History of kidney stones  PONV (postoperative nausea and vomiting) Sepsis  Anemia ADHD (attention deficit hyperactivity disorder)  Family history of genetic disease Complication of anesthesia  Unspecified porphyria Superior mesenteric artery syndrome Scoliosis  PTSD (post-traumatic stress disorder) Panic attacks  Seizure-like activity    Reproductive/Obstetrics negative OB ROS                             Anesthesia Physical Anesthesia Plan  ASA: 3  Anesthesia Plan: General   Post-op Pain Management:    Induction:  Intravenous  PONV Risk Score and Plan:   Airway Management Planned: Natural Airway and Nasal Cannula  Additional Equipment:   Intra-op Plan:   Post-operative Plan:   Informed Consent: I have reviewed the patients History and Physical, chart, labs and discussed the procedure including the risks, benefits and alternatives for the proposed anesthesia with the patient or authorized representative who has indicated his/her understanding and acceptance.     Dental Advisory Given  Plan Discussed with: Anesthesiologist, CRNA and Surgeon  Anesthesia Plan Comments: (Patient consented for risks of anesthesia including but not limited to:  - adverse reactions to medications - risk of airway placement if required - damage to eyes, teeth, lips or other oral mucosa - nerve damage due to positioning  - sore throat or hoarseness - Damage to heart, brain, nerves, lungs, other parts of body or loss of life  Patient voiced understanding.)       Anesthesia Quick Evaluation

## 2022-10-01 ENCOUNTER — Encounter: Payer: Self-pay | Admitting: Internal Medicine

## 2022-10-01 ENCOUNTER — Encounter: Payer: Self-pay | Admitting: Nurse Practitioner

## 2022-10-05 ENCOUNTER — Telehealth: Payer: Self-pay | Admitting: *Deleted

## 2022-10-05 ENCOUNTER — Encounter: Payer: Self-pay | Admitting: *Deleted

## 2022-10-05 NOTE — Telephone Encounter (Signed)
Called pt back this afternoon. She became frightened when our office called to set up an "urgent" my chart visit for Thurs and patient has a colonoscopy that day and she can't do it then. I reassured patient this is not an emergency. Dr Donneta Romberg looked at the urine results that a [facility] doctor ordered. Pt sent a my chart requesting Dr Donneta Romberg to review those results. Pt can safely proceed with her colonoscopy on Thurs. We have a my chart visit scheduled with her next week.

## 2022-10-05 NOTE — Telephone Encounter (Signed)
Pt call back. Stated she unable to schedule for Thursday. I have scheduled her for next available. She request a call to let her know if there is anything she needs to worry about between now and her appt.

## 2022-10-06 ENCOUNTER — Telehealth: Payer: Self-pay | Admitting: Internal Medicine

## 2022-10-06 DIAGNOSIS — G894 Chronic pain syndrome: Secondary | ICD-10-CM | POA: Diagnosis not present

## 2022-10-06 DIAGNOSIS — Z79899 Other long term (current) drug therapy: Secondary | ICD-10-CM | POA: Diagnosis not present

## 2022-10-06 DIAGNOSIS — R030 Elevated blood-pressure reading, without diagnosis of hypertension: Secondary | ICD-10-CM | POA: Diagnosis not present

## 2022-10-06 DIAGNOSIS — Z681 Body mass index (BMI) 19 or less, adult: Secondary | ICD-10-CM | POA: Diagnosis not present

## 2022-10-06 NOTE — Telephone Encounter (Signed)
Spoke to pt re: the inconclusive results of urine porphyrias testing- understands that she might need further testing.   Pt currently awaiting colo on 4/24.  Pt will follow up as planned- next week to discuss further.  GB

## 2022-10-07 ENCOUNTER — Other Ambulatory Visit: Payer: Self-pay

## 2022-10-07 ENCOUNTER — Ambulatory Visit: Payer: Self-pay | Admitting: *Deleted

## 2022-10-07 ENCOUNTER — Ambulatory Visit
Admission: RE | Admit: 2022-10-07 | Discharge: 2022-10-07 | Disposition: A | Discharge status: Home / Self Care | Payer: Medicaid Other | Attending: Gastroenterology | Admitting: Gastroenterology

## 2022-10-07 ENCOUNTER — Ambulatory Visit: Payer: Medicaid Other | Admitting: Anesthesiology

## 2022-10-07 ENCOUNTER — Encounter: Payer: Self-pay | Admitting: Nurse Practitioner

## 2022-10-07 ENCOUNTER — Encounter
Admission: RE | Disposition: A | Discharge status: Home / Self Care | Payer: Self-pay | Source: Home / Self Care | Attending: Gastroenterology

## 2022-10-07 ENCOUNTER — Encounter: Payer: Self-pay | Admitting: Gastroenterology

## 2022-10-07 DIAGNOSIS — Z87891 Personal history of nicotine dependence: Secondary | ICD-10-CM | POA: Insufficient documentation

## 2022-10-07 DIAGNOSIS — F32A Depression, unspecified: Secondary | ICD-10-CM | POA: Diagnosis not present

## 2022-10-07 DIAGNOSIS — I10 Essential (primary) hypertension: Secondary | ICD-10-CM | POA: Insufficient documentation

## 2022-10-07 DIAGNOSIS — K5900 Constipation, unspecified: Secondary | ICD-10-CM | POA: Insufficient documentation

## 2022-10-07 DIAGNOSIS — R1032 Left lower quadrant pain: Secondary | ICD-10-CM | POA: Diagnosis present

## 2022-10-07 DIAGNOSIS — K649 Unspecified hemorrhoids: Secondary | ICD-10-CM | POA: Diagnosis not present

## 2022-10-07 DIAGNOSIS — R1084 Generalized abdominal pain: Secondary | ICD-10-CM

## 2022-10-07 DIAGNOSIS — F410 Panic disorder [episodic paroxysmal anxiety] without agoraphobia: Secondary | ICD-10-CM | POA: Diagnosis not present

## 2022-10-07 DIAGNOSIS — R112 Nausea with vomiting, unspecified: Secondary | ICD-10-CM | POA: Diagnosis not present

## 2022-10-07 HISTORY — DX: Panic disorder (episodic paroxysmal anxiety): F41.0

## 2022-10-07 HISTORY — DX: Chronic vascular disorders of intestine: K55.1

## 2022-10-07 HISTORY — DX: Post-traumatic stress disorder, unspecified: F43.10

## 2022-10-07 HISTORY — DX: Other complications of anesthesia, initial encounter: T88.59XA

## 2022-10-07 HISTORY — DX: Raynaud's syndrome without gangrene: I73.00

## 2022-10-07 HISTORY — DX: Unspecified convulsions: R56.9

## 2022-10-07 HISTORY — DX: Scoliosis, unspecified: M41.9

## 2022-10-07 SURGERY — COLONOSCOPY WITH PROPOFOL
Anesthesia: General

## 2022-10-07 MED ORDER — SODIUM CHLORIDE 0.9 % IV SOLN: INTRAVENOUS | Status: DC

## 2022-10-07 MED ORDER — ONDANSETRON HCL 4 MG/2ML IJ SOLN
INTRAMUSCULAR | Status: DC | PRN
  Administered 2022-10-07: 4 mg via INTRAVENOUS

## 2022-10-07 MED ORDER — KETOROLAC TROMETHAMINE 30 MG/ML IJ SOLN
INTRAMUSCULAR | Status: DC | PRN
  Administered 2022-10-07: 15 mg via INTRAVENOUS

## 2022-10-07 MED ORDER — LIDOCAINE HCL (CARDIAC) PF 100 MG/5ML IV SOSY
PREFILLED_SYRINGE | INTRAVENOUS | Status: DC | PRN
  Administered 2022-10-07: 40 mg via INTRAVENOUS

## 2022-10-07 MED ORDER — LACTATED RINGERS IV SOLN: INTRAVENOUS | Status: DC

## 2022-10-07 MED ORDER — PROPOFOL 10 MG/ML IV BOLUS
INTRAVENOUS | Status: DC | PRN
  Administered 2022-10-07 (×2): 40 mg via INTRAVENOUS

## 2022-10-07 SURGICAL SUPPLY — 6 items
GOWN CVR UNV OPN BCK APRN NK (MISCELLANEOUS) ×4 IMPLANT
GOWN ISOL THUMB LOOP REG UNIV (MISCELLANEOUS) ×2
KIT PRC NS LF DISP ENDO (KITS) ×2 IMPLANT
KIT PROCEDURE OLYMPUS (KITS) ×1
MANIFOLD NEPTUNE II (INSTRUMENTS) ×2 IMPLANT
WATER STERILE IRR 250ML POUR (IV SOLUTION) ×2 IMPLANT

## 2022-10-07 NOTE — H&P (Signed)
Arlyss Repress, MD [address]  [address]  [place], [place] [address]  Main: [phone]  Fax: [phone] Pager: [phone]  Primary Care Physician:  [facility] Primary Gastroenterologist:  Dr. Arlyss Repress  Pre-Procedure History & Physical: HPI:  Megan Bentley is a 37 y.o. female is here for an colonoscopy.   Past Medical History:  Diagnosis Date   ADHD (attention deficit hyperactivity disorder)    Allergy    Anemia    Anxiety    Arthritis    Complication of anesthesia    Reports anesthesia given at [facility] took days to clear her system   Depression    Family history of genetic disease 08/04/2021   History of kidney stones    Kidney stone    Migraines    Panic attacks    Pneumonia    PONV (postoperative nausea and vomiting)    Preeclampsia    PTSD (post-traumatic stress disorder)    Raynaud's syndrome    Renal disorder    Reynolds syndrome    Scoliosis    Seizure-like activity    Sepsis    Superior mesenteric artery syndrome     Past Surgical History:  Procedure Laterality Date   ANTERIOR AND POSTERIOR REPAIR N/A 06/26/2021   Procedure: ANTERIOR (CYSTOCELE) AND POSTERIOR REPAIR (RECTOCELE);  Surgeon: Schermerhorn, Ihor Austin, MD;  Location: [facility] ORS;  Service: Gynecology;  Laterality: N/A;   BILATERAL SALPINGECTOMY Bilateral 06/26/2021   Procedure: BILATERAL SALPINGECTOMY;  Surgeon: Schermerhorn, Ihor Austin, MD;  Location: [facility] ORS;  Service: Gynecology;  Laterality: Bilateral;   CYSTOSCOPY W/ RETROGRADES Bilateral 08/06/2015   Procedure: CYSTOSCOPY WITH RETROGRADE PYELOGRAM;  Surgeon: [name], MD;  Location: [facility] ORS;  Service: Urology;  Laterality: Bilateral;   CYSTOSCOPY/URETEROSCOPY/HOLMIUM LASER/STENT PLACEMENT Left 08/06/2015   Procedure: CYSTOSCOPY/URETEROSCOPY/HOLMIUM LASER/STENT PLACEMENT;  Surgeon: [name], MD;  Location: [facility] ORS;  Service: Urology;  Laterality: Left;   VAGINAL HYSTERECTOMY N/A  06/26/2021   Procedure: HYSTERECTOMY VAGINAL;  Surgeon: Schermerhorn, Ihor Austin, MD;  Location: [facility] ORS;  Service: Gynecology;  Laterality: N/A;   WISDOM TOOTH EXTRACTION      Prior to Admission medications   Medication Sig Start Date End Date Taking? Authorizing Provider  clonazePAM (KLONOPIN) 0.5 MG tablet Take 0.25-0.5 mg by mouth daily as needed. 03/03/22  Yes [provider]  dicyclomine (BENTYL) 10 MG capsule Take 10 mg by mouth 3 (three) times daily before meals. 09/23/22  Yes [provider]  Multiple Vitamin (MULTIVITAMIN) tablet Take 1 tablet by mouth daily.   Yes [provider]  ondansetron (ZOFRAN) 4 MG tablet TAKE 1 TABLET BY MOUTH EVERY 8 HOURS AS NEEDED FOR NAUSEA AND VOMITING 09/29/22  Yes Cannady, [name] T, NP  pregabalin (LYRICA) 150 MG capsule Take 150 mg by mouth 3 (three) times daily.   Yes [provider]  Thiamine HCl (VITAMIN B1 PO) Take by mouth.   Yes [provider]  gabapentin (NEURONTIN) 300 MG capsule Take by mouth. Patient not taking: Reported on 09/23/2022    [provider]  hydroxychloroquine (PLAQUENIL) 200 MG tablet Take 1 tablet (200 mg total) by mouth daily. Patient not taking: Reported on 09/30/2022 07/23/22   Fuller Plan, MD  metroNIDAZOLE (METROGEL) 1 % gel Apply topically daily. Patient not taking: Reported on 09/30/2022 05/21/22   Aura Dials T, NP  pantoprazole (PROTONIX) 40 MG tablet Take by mouth daily. Patient not taking: Reported on 09/30/2022 09/23/22   [provider]  Allergies as of 09/30/2022 - Review Complete 09/30/2022  Allergen Reaction Noted   Sulfa antibiotics Anaphylaxis 04/20/2015   Molnupiravir Hives and Itching 04/20/2021   Amoxicillin Itching and Other (See Comments) 08/27/2020   Prednisone Rash 04/18/2021    Family History  Problem Relation Age of Onset   Multiple sclerosis Other    Sjogren's syndrome Other        Aunt kim   Kidney Stones Other     Anxiety disorder Mother    Multiple sclerosis Mother    Raynaud syndrome Father    Anxiety disorder Father    Diabetes Father    Anxiety disorder Maternal Grandmother    COPD Maternal Grandmother    Heart disease Maternal Grandfather    Multiple sclerosis Sister    Lung disease Paternal Grandmother    Autoimmune disease Paternal Grandmother    Kidney disease Neg Hx    Bladder Cancer Neg Hx     Social History   Socioeconomic History   Marital status: Divorced    Spouse name: Not on file   Number of children: 2   Years of education: Not on file   Highest education level: Not on file  Occupational History   Not on file  Tobacco Use   Smoking status: Former    Packs/day: 0.30    Years: 11.00    Additional pack years: 0.00    Total pack years: 3.30    Types: Cigarettes    Quit date: 2021    Years since quitting: 3.3    Passive exposure: Past   Smokeless tobacco: Never  Vaping Use   Vaping Use: Some days   Substances: Nicotine   Devices: [place] Disposable  Substance and Sexual Activity   Alcohol use: No    Alcohol/week: 0.0 standard drinks of alcohol   Drug use: Not Currently    Types: Marijuana    Comment: last used 2021   Sexual activity: Not Currently  Other Topics Concern   Not on file  Social History Narrative   Not on file   Social Determinants of Health   Financial Resource Strain: Low Risk  (04/10/2020)   Overall Financial Resource Strain (CARDIA)    Difficulty of Paying Living Expenses: Not hard at all  Food Insecurity: No Food Insecurity (04/10/2020)   Hunger Vital Sign    Worried About Running Out of Food in the Last Year: Never true    Ran Out of Food in the Last Year: Never true  Transportation Needs: No Transportation Needs (04/10/2020)   PRAPARE - Administrator, Civil Service (Medical): No    Lack of Transportation (Non-Medical): No  Physical Activity: Inactive (04/10/2020)   Exercise Vital Sign    Days of Exercise per Week: 0  days    Minutes of Exercise per Session: 0 min  Stress: Stress Concern Present (03/22/2022)   [employer] of Occupational Health - Occupational Stress Questionnaire    Feeling of Stress : Rather much  Social Connections: Moderately Isolated (04/10/2020)   Social Connection and Isolation Panel [NHANES]    Frequency of Communication with Friends and Family: Twice a week    Frequency of Social Gatherings with Friends and Family: Twice a week    Attends Religious Services: Never    Database administrator or Organizations: No    Attends Banker Meetings: Never    Marital Status: Living with partner  Intimate Partner Violence: Not on file    Review of Systems:  See HPI, otherwise negative ROS  Physical Exam: BP 111/86   Pulse 82   Temp 98.8 F (37.1 C)   Ht  (1.626 m)   Wt 41.7 kg   LMP 06/11/2021 (Approximate)   SpO2 100%   BMI 15.79 kg/m  General:   Alert,  pleasant and cooperative in NAD Head:  Normocephalic and atraumatic. Neck:  Supple; no masses or thyromegaly. Lungs:  Clear throughout to auscultation.    Heart:  Regular rate and rhythm. Abdomen:  Soft, nontender and nondistended. Normal bowel sounds, without guarding, and without rebound.   Neurologic:  Alert and  oriented x4;  grossly normal neurologically.  Impression/Plan: Megan Bentley is here for an colonoscopy to be performed for generalized abdominal pain, weight loss, SMA syndrome  Risks, benefits, limitations, and alternatives regarding  colonoscopy have been reviewed with the patient.  Questions have been answered.  All parties agreeable.   Lannette Donath, MD  10/07/2022, 10:59 AM

## 2022-10-07 NOTE — Anesthesia Postprocedure Evaluation (Signed)
Anesthesia Post Note  Patient: Megan Bentley  Procedure(s) Performed: COLONOSCOPY WITH PROPOFOL  Patient location during evaluation: PACU Anesthesia Type: General Level of consciousness: awake and alert Pain management: pain level controlled Vital Signs Assessment: post-procedure vital signs reviewed and stable Respiratory status: spontaneous breathing, nonlabored ventilation, respiratory function stable and patient connected to nasal cannula oxygen Cardiovascular status: blood pressure returned to baseline and stable Postop Assessment: no apparent nausea or vomiting Anesthetic complications: no   No notable events documented.   Last Vitals:  Vitals:   10/07/22 1156 10/07/22 1213  BP: (!) 88/58 110/87  Pulse: 73 74  Resp: 18 (!) 22  Temp: (!) 36.4 C (!) 36.4 C  SpO2: 96% 98%    Last Pain:  Vitals:   10/07/22 1213  PainSc: 0-No pain                 Jermale Crass C Lerae Langham

## 2022-10-07 NOTE — Telephone Encounter (Signed)
Patient called to request PCP call [facility] Medical pain clinic to confirm/ or verify patient has been seen by multiple specialists prior to being treated at pain clinic. Reports she is still having pain in abdomen and low back and in need of assistance. Reports colonoscopy today was unsuccessful due to not tolerating prep. Requesting PCP call pain clinic to report she has been seen by a  psychiatrist, neuro, GI, rheumatologist.     Reason for Disposition  [1] Caller requesting NON-URGENT health information AND [2] PCP's office is the best resource  Answer Assessment - Initial Assessment Questions 1. REASON FOR CALL or QUESTION: "What is your reason for calling today?" or "How can I best help you?" or "What question do you have that I can help answer?"     Pain all over and needs PCP to contact [facility] pain clinic to confirm patient has seen specialists for pain management prior to being treated at pain management clinic.  Protocols used: Information Only Call - No Triage-A-AH

## 2022-10-07 NOTE — Op Note (Signed)
[facility] Gastroenterology Patient Name: Megan Bentley Procedure Date: 10/07/2022 11:23 AM MRN: [id number] Account #: [id number] Date of Birth: [birth year] Admit Type: Outpatient Age: 37 Room: [facility] OR ROOM 01 Gender: Female Note Status: Finalized Instrument Name: [facility] [id number] Procedure:             Colonoscopy Indications:           Abdominal pain in the left lower quadrant Providers:             Toney Reil MD, MD Referring MD:          [facility] (Referring MD) Medicines:             General Anesthesia Complications:         No immediate complications. Estimated blood loss: None. Procedure:             Pre-Anesthesia Assessment:                        - Prior to the procedure, a History and Physical was                         performed, and patient medications and allergies were                         reviewed. The patient is competent. The risks and                         benefits of the procedure and the sedation options and                         risks were discussed with the patient. All questions                         were answered and informed consent was obtained.                         Patient identification and proposed procedure were                         verified by the physician, the nurse, the                         anesthesiologist, the anesthetist and the technician                         in the pre-procedure area in the procedure room in the                         endoscopy suite. Mental Status Examination: alert and                         oriented. Airway Examination: normal oropharyngeal                         airway and neck mobility. Respiratory Examination:                         clear to auscultation. CV Examination: normal.  Prophylactic Antibiotics: The patient does not require                         prophylactic antibiotics. Prior Anticoagulants: The                         patient has  taken no anticoagulant or antiplatelet                         agents. ASA Grade Assessment: III - A patient with                         severe systemic disease. After reviewing the risks and                         benefits, the patient was deemed in satisfactory                         condition to undergo the procedure. The anesthesia                         plan was to use general anesthesia. Immediately prior                         to administration of medications, the patient was                         re-assessed for adequacy to receive sedatives. The                         heart rate, respiratory rate, oxygen saturations,                         blood pressure, adequacy of pulmonary ventilation, and                         response to care were monitored throughout the                         procedure. The physical status of the patient was                         re-assessed after the procedure.                        After obtaining informed consent, the colonoscope was                         passed under direct vision. Throughout the procedure,                         the patient's blood pressure, pulse, and oxygen                         saturations were monitored continuously. The procedure                         was aborted. The colonscope was not inserted.  Medications were given. The colonoscopy was extremely                         difficult due to poor bowel prep with stool present.                         Successful completion of the procedure was aided by                         Scope not inserted. Findings:      The perianal and digital rectal examinations were normal. Pertinent       negatives include normal sphincter tone and no palpable rectal lesions.      Scope not inserted due to semiliquid brown stool running per rectum and       solid brown stool felt on diital rectal exam      Severe constipation is the cause of abdominal  bloating, LLQ pain,       nausea, poor po intake Impression:            - No specimens collected. Recommendation:        - Discharge patient to home (with escort).                        - Full liquid diet today.                        - Continue present medications.                        - Recommend clean out with Miralax prep                        - Return to my office in 1 month. Diagnosis Code(s):     --- Professional ---                        R10.32, Left lower quadrant pain Dr. Libby Maw Toney Reil MD, MD 10/07/2022 11:47:43 AM This report has been signed electronically. Number of Addenda: 0 Note Initiated On: 10/07/2022 11:23 AM Estimated Blood Loss:  Estimated blood loss: none.      [facility]

## 2022-10-07 NOTE — Transfer of Care (Signed)
Immediate Anesthesia Transfer of Care Note  Patient: Megan Bentley  Procedure(s) Performed: COLONOSCOPY WITH PROPOFOL  Patient Location: PACU  Anesthesia Type: General  Level of Consciousness: awake, alert  and patient cooperative  Airway and Oxygen Therapy: Patient Spontanous Breathing and Patient connected to supplemental oxygen  Post-op Assessment: Post-op Vital signs reviewed, Patient's Cardiovascular Status Stable, Respiratory Function Stable, Patent Airway and No signs of Nausea or vomiting  Post-op Vital Signs: Reviewed and stable  Complications: No notable events documented.

## 2022-10-08 ENCOUNTER — Telehealth: Payer: Self-pay | Admitting: Nurse Practitioner

## 2022-10-08 ENCOUNTER — Encounter: Payer: Self-pay | Admitting: Gastroenterology

## 2022-10-08 DIAGNOSIS — K5900 Constipation, unspecified: Secondary | ICD-10-CM

## 2022-10-08 NOTE — Telephone Encounter (Signed)
Copied from CRM [id number]. Topic: General - Other >> Oct 08, 2022 11:40 AM Turkey B wrote: Reason for CRM: Patient called to request PCP call [facility] pain clinic to confirm/ or verify patient has been seen by multiple specialists prior to being treated at pain clinic. Reports she is still having pain in abdomen and low back and in need of assistance. Reports colonoscopy today was unsuccessful due to not tolerating prep. Requesting PCP call pain clinic to report she has been seen by a  psychiatrist, neuro, GI, rheumatologist.

## 2022-10-08 NOTE — Telephone Encounter (Signed)
My chart message has been sent to patient.

## 2022-10-11 DIAGNOSIS — K5909 Other constipation: Secondary | ICD-10-CM | POA: Diagnosis not present

## 2022-10-11 DIAGNOSIS — R109 Unspecified abdominal pain: Secondary | ICD-10-CM | POA: Diagnosis not present

## 2022-10-11 DIAGNOSIS — Z139 Encounter for screening, unspecified: Secondary | ICD-10-CM | POA: Diagnosis not present

## 2022-10-11 DIAGNOSIS — F411 Generalized anxiety disorder: Secondary | ICD-10-CM | POA: Diagnosis not present

## 2022-10-11 DIAGNOSIS — Z1331 Encounter for screening for depression: Secondary | ICD-10-CM | POA: Diagnosis not present

## 2022-10-11 DIAGNOSIS — F330 Major depressive disorder, recurrent, mild: Secondary | ICD-10-CM | POA: Diagnosis not present

## 2022-10-11 DIAGNOSIS — G8929 Other chronic pain: Secondary | ICD-10-CM | POA: Diagnosis not present

## 2022-10-11 DIAGNOSIS — G894 Chronic pain syndrome: Secondary | ICD-10-CM | POA: Diagnosis not present

## 2022-10-12 ENCOUNTER — Telehealth: Payer: Medicaid Other | Admitting: Nurse Practitioner

## 2022-10-12 ENCOUNTER — Encounter: Payer: Self-pay | Admitting: Nurse Practitioner

## 2022-10-12 ENCOUNTER — Ambulatory Visit: Payer: Medicaid Other | Admitting: Nurse Practitioner

## 2022-10-12 ENCOUNTER — Inpatient Hospital Stay: Payer: Medicaid Other | Attending: Internal Medicine | Admitting: Internal Medicine

## 2022-10-12 ENCOUNTER — Encounter: Payer: Self-pay | Admitting: Internal Medicine

## 2022-10-12 NOTE — Assessment & Plan Note (Addendum)
#   Random urine total porphyrin abnormal.  Urine random PBG-negative.  Discussed with the patient that the workup of porphyrias in general is quite complicated.   # Given the nonspecific complaints?  Acute intermittent porphyria-I would recommend repeating random & 24 hours- PBG; total porphyrins; and also random ALA. also discussed regarding second opinion at [facility].  Patient currently is interested in workup here locally; if still inconclusive would seek further evaluation at [facility].  # Chronic abdominal pain/constipation- ? Currently on oxycodone.  However, constipation prior to narcotic. ? SMA syndrome versus others.  Clinically unlikely related to porphyria; however in the abnormalities-will reinitiate the workup.   # hx of Seizures [last around christmas]/ADHD- on vyvase only when in school- stable.   # DISPOSITION: # labs ordered - # follow up  3 weeks- virtual -MD; no labs- Dr.B

## 2022-10-12 NOTE — Progress Notes (Signed)
Fatigue/weakness: yes Dyspena: no Light headedness: yes Blood in stool: yes  C/o back pain 7/10.

## 2022-10-12 NOTE — Progress Notes (Signed)
I connected with Megan Bentley on 10/12/22 at  1:00 PM EDT by video enabled telemedicine visit and verified that I am speaking with the correct person using two identifiers.  I discussed the limitations, risks, security and privacy concerns of performing an evaluation and management service by telemedicine and the availability of in-person appointments. I also discussed with the patient that there may be a patient responsible charge related to this service. The patient expressed understanding and agreed to proceed.    Other persons participating in the visit and their role in the encounter: RN/medical reconciliation Patient's location: home Provider's location: office  Oncology History   No history exists.   Chief Complaint: Abnormal porphyria labs   History of present illness:Megan Bentley 37 y.o.  female with history of chronic abdominal pain/constipation of unclear etiology is here to review the results of her workup for porphyria.  In the last 2 to 3 months patient had multiple visits to the hospital-multiple evaluations-most at [facility].  However no obvious etiology noted.   Patient is obviously very frustrated with lack of clear answers to her ongoing problems.  Patient complains of ongoing extreme fatigue.  Also patient had a colonoscopy last week-which had to be aborted because of poor prep/constipation.  Observation/objective: Alert & oriented x 3. In No acute distress.   Assessment and plan: Other porphyria (HCC) # Random urine total porphyrin abnormal.  Urine random PBG-negative.  Discussed with the patient that the workup of porphyrias in general is quite complicated.   # Given the nonspecific complaints?  Acute intermittent porphyria-I would recommend repeating random & 24 hours- PBG; total porphyrins; and also random ALA. also discussed regarding second opinion at [facility].  Patient currently is interested in workup here locally; if still inconclusive would seek further evaluation at  [facility].  # Chronic abdominal pain/constipation- ? Currently on oxycodone.  However, constipation prior to narcotic. ? SMA syndrome versus others.  Clinically unlikely related to porphyria; however in the abnormalities-will reinitiate the workup.   # hx of Seizures [last around christmas]/ADHD- on vyvase only when in school- stable.   # DISPOSITION: # labs ordered - # follow up  3 weeks- virtual -MD; no labs- Dr.B  Dr. Louretta Shorten [facility] 10/12/2022 3:49 PM

## 2022-10-13 DIAGNOSIS — Z419 Encounter for procedure for purposes other than remedying health state, unspecified: Secondary | ICD-10-CM | POA: Diagnosis not present

## 2022-10-20 ENCOUNTER — Inpatient Hospital Stay: Payer: Medicaid Other | Admitting: Nurse Practitioner

## 2022-10-20 DIAGNOSIS — M533 Sacrococcygeal disorders, not elsewhere classified: Secondary | ICD-10-CM | POA: Diagnosis not present

## 2022-10-20 DIAGNOSIS — R768 Other specified abnormal immunological findings in serum: Secondary | ICD-10-CM | POA: Diagnosis not present

## 2022-10-20 DIAGNOSIS — M791 Myalgia, unspecified site: Secondary | ICD-10-CM | POA: Diagnosis not present

## 2022-10-20 DIAGNOSIS — R5382 Chronic fatigue, unspecified: Secondary | ICD-10-CM | POA: Diagnosis not present

## 2022-10-20 DIAGNOSIS — M2550 Pain in unspecified joint: Secondary | ICD-10-CM | POA: Diagnosis not present

## 2022-10-25 DIAGNOSIS — R109 Unspecified abdominal pain: Secondary | ICD-10-CM | POA: Diagnosis not present

## 2022-10-25 DIAGNOSIS — Z87442 Personal history of urinary calculi: Secondary | ICD-10-CM | POA: Diagnosis not present

## 2022-10-25 DIAGNOSIS — G8929 Other chronic pain: Secondary | ICD-10-CM | POA: Diagnosis not present

## 2022-10-25 DIAGNOSIS — F4310 Post-traumatic stress disorder, unspecified: Secondary | ICD-10-CM | POA: Diagnosis not present

## 2022-10-25 DIAGNOSIS — F411 Generalized anxiety disorder: Secondary | ICD-10-CM | POA: Diagnosis not present

## 2022-10-25 DIAGNOSIS — G894 Chronic pain syndrome: Secondary | ICD-10-CM | POA: Diagnosis not present

## 2022-10-25 DIAGNOSIS — Z90710 Acquired absence of both cervix and uterus: Secondary | ICD-10-CM | POA: Diagnosis not present

## 2022-10-25 DIAGNOSIS — R569 Unspecified convulsions: Secondary | ICD-10-CM | POA: Diagnosis not present

## 2022-10-25 DIAGNOSIS — Z87891 Personal history of nicotine dependence: Secondary | ICD-10-CM | POA: Diagnosis not present

## 2022-10-25 DIAGNOSIS — Z79899 Other long term (current) drug therapy: Secondary | ICD-10-CM | POA: Diagnosis not present

## 2022-11-02 DIAGNOSIS — G894 Chronic pain syndrome: Secondary | ICD-10-CM | POA: Diagnosis not present

## 2022-11-05 ENCOUNTER — Inpatient Hospital Stay: Payer: Medicaid Other | Admitting: Internal Medicine

## 2022-11-05 NOTE — Progress Notes (Unsigned)
Pt needs to r/s appt, has conflicting appt with daughter.

## 2022-11-12 DIAGNOSIS — R109 Unspecified abdominal pain: Secondary | ICD-10-CM | POA: Diagnosis not present

## 2022-11-12 DIAGNOSIS — G8929 Other chronic pain: Secondary | ICD-10-CM | POA: Diagnosis not present

## 2022-11-13 DIAGNOSIS — Z419 Encounter for procedure for purposes other than remedying health state, unspecified: Secondary | ICD-10-CM | POA: Diagnosis not present

## 2022-11-25 DIAGNOSIS — G8929 Other chronic pain: Secondary | ICD-10-CM | POA: Diagnosis not present

## 2022-11-25 DIAGNOSIS — G43009 Migraine without aura, not intractable, without status migrainosus: Secondary | ICD-10-CM | POA: Diagnosis not present

## 2022-11-25 DIAGNOSIS — G894 Chronic pain syndrome: Secondary | ICD-10-CM | POA: Diagnosis not present

## 2022-11-25 DIAGNOSIS — M797 Fibromyalgia: Secondary | ICD-10-CM | POA: Diagnosis not present

## 2022-11-25 DIAGNOSIS — R109 Unspecified abdominal pain: Secondary | ICD-10-CM | POA: Diagnosis not present

## 2022-11-30 DIAGNOSIS — R42 Dizziness and giddiness: Secondary | ICD-10-CM | POA: Diagnosis not present

## 2022-11-30 DIAGNOSIS — R202 Paresthesia of skin: Secondary | ICD-10-CM | POA: Diagnosis not present

## 2022-11-30 DIAGNOSIS — G43109 Migraine with aura, not intractable, without status migrainosus: Secondary | ICD-10-CM | POA: Diagnosis not present

## 2022-11-30 DIAGNOSIS — F419 Anxiety disorder, unspecified: Secondary | ICD-10-CM | POA: Diagnosis not present

## 2022-11-30 DIAGNOSIS — R200 Anesthesia of skin: Secondary | ICD-10-CM | POA: Diagnosis not present

## 2022-11-30 DIAGNOSIS — R55 Syncope and collapse: Secondary | ICD-10-CM | POA: Diagnosis not present

## 2022-11-30 DIAGNOSIS — R569 Unspecified convulsions: Secondary | ICD-10-CM | POA: Diagnosis not present

## 2022-12-01 DIAGNOSIS — M2550 Pain in unspecified joint: Secondary | ICD-10-CM | POA: Diagnosis not present

## 2022-12-01 DIAGNOSIS — M797 Fibromyalgia: Secondary | ICD-10-CM | POA: Diagnosis not present

## 2022-12-01 DIAGNOSIS — R202 Paresthesia of skin: Secondary | ICD-10-CM | POA: Diagnosis not present

## 2022-12-02 ENCOUNTER — Telehealth: Payer: Self-pay | Admitting: Psychology

## 2022-12-02 ENCOUNTER — Encounter: Payer: Medicaid Other | Attending: Psychology

## 2022-12-02 DIAGNOSIS — G43109 Migraine with aura, not intractable, without status migrainosus: Secondary | ICD-10-CM | POA: Insufficient documentation

## 2022-12-02 DIAGNOSIS — R41840 Attention and concentration deficit: Secondary | ICD-10-CM | POA: Insufficient documentation

## 2022-12-02 DIAGNOSIS — G40909 Epilepsy, unspecified, not intractable, without status epilepticus: Secondary | ICD-10-CM | POA: Insufficient documentation

## 2022-12-02 DIAGNOSIS — R768 Other specified abnormal immunological findings in serum: Secondary | ICD-10-CM | POA: Insufficient documentation

## 2022-12-02 DIAGNOSIS — F410 Panic disorder [episodic paroxysmal anxiety] without agoraphobia: Secondary | ICD-10-CM | POA: Insufficient documentation

## 2022-12-02 DIAGNOSIS — Z87898 Personal history of other specified conditions: Secondary | ICD-10-CM | POA: Insufficient documentation

## 2022-12-02 DIAGNOSIS — G894 Chronic pain syndrome: Secondary | ICD-10-CM | POA: Insufficient documentation

## 2022-12-02 DIAGNOSIS — F4312 Post-traumatic stress disorder, chronic: Secondary | ICD-10-CM | POA: Insufficient documentation

## 2022-12-02 NOTE — Telephone Encounter (Signed)
I have spoken to patient to let her know that we will not be able to reschedule any further appointments for testing per Dr. Kieth Brightly, she has canceled 6 times for testing.

## 2022-12-06 ENCOUNTER — Telehealth: Payer: Self-pay | Admitting: *Deleted

## 2022-12-06 ENCOUNTER — Inpatient Hospital Stay: Payer: Medicaid Other | Admitting: Internal Medicine

## 2022-12-06 NOTE — Telephone Encounter (Signed)
Attempted to reach out to patient regarding her My chart visit for this afternoon with Dr Donneta Romberg. Pt has not had any of the labs drawn that were ordered for her prior to todays visit. No answer at pt phone number.

## 2022-12-13 DIAGNOSIS — Z419 Encounter for procedure for purposes other than remedying health state, unspecified: Secondary | ICD-10-CM | POA: Diagnosis not present

## 2022-12-31 ENCOUNTER — Encounter: Payer: Self-pay | Admitting: Internal Medicine

## 2022-12-31 ENCOUNTER — Inpatient Hospital Stay: Payer: Medicaid Other | Admitting: Internal Medicine

## 2023-01-04 ENCOUNTER — Other Ambulatory Visit: Payer: Self-pay

## 2023-01-05 ENCOUNTER — Other Ambulatory Visit: Payer: Self-pay

## 2023-01-07 ENCOUNTER — Telehealth: Payer: Medicaid Other | Admitting: Internal Medicine

## 2023-01-07 ENCOUNTER — Inpatient Hospital Stay: Payer: Medicaid Other | Admitting: Internal Medicine

## 2023-01-13 DIAGNOSIS — Z419 Encounter for procedure for purposes other than remedying health state, unspecified: Secondary | ICD-10-CM | POA: Diagnosis not present

## 2023-01-14 DIAGNOSIS — R300 Dysuria: Secondary | ICD-10-CM | POA: Diagnosis not present

## 2023-01-14 DIAGNOSIS — G894 Chronic pain syndrome: Secondary | ICD-10-CM | POA: Diagnosis not present

## 2023-01-19 ENCOUNTER — Ambulatory Visit: Payer: Medicaid Other | Admitting: Psychology

## 2023-01-21 ENCOUNTER — Telehealth: Payer: Medicaid Other | Admitting: Internal Medicine

## 2023-01-25 DIAGNOSIS — N200 Calculus of kidney: Secondary | ICD-10-CM | POA: Diagnosis not present

## 2023-01-25 DIAGNOSIS — G43909 Migraine, unspecified, not intractable, without status migrainosus: Secondary | ICD-10-CM | POA: Diagnosis not present

## 2023-01-25 DIAGNOSIS — R1030 Lower abdominal pain, unspecified: Secondary | ICD-10-CM | POA: Diagnosis not present

## 2023-01-25 DIAGNOSIS — Z79891 Long term (current) use of opiate analgesic: Secondary | ICD-10-CM | POA: Diagnosis not present

## 2023-01-25 DIAGNOSIS — I7300 Raynaud's syndrome without gangrene: Secondary | ICD-10-CM | POA: Diagnosis not present

## 2023-01-25 DIAGNOSIS — F5000 Anorexia nervosa, unspecified: Secondary | ICD-10-CM | POA: Diagnosis not present

## 2023-01-25 DIAGNOSIS — Z79899 Other long term (current) drug therapy: Secondary | ICD-10-CM | POA: Diagnosis not present

## 2023-01-25 DIAGNOSIS — R109 Unspecified abdominal pain: Secondary | ICD-10-CM | POA: Diagnosis not present

## 2023-01-25 DIAGNOSIS — M329 Systemic lupus erythematosus, unspecified: Secondary | ICD-10-CM | POA: Diagnosis not present

## 2023-01-25 DIAGNOSIS — R140 Abdominal distension (gaseous): Secondary | ICD-10-CM | POA: Diagnosis not present

## 2023-01-25 DIAGNOSIS — Z791 Long term (current) use of non-steroidal anti-inflammatories (NSAID): Secondary | ICD-10-CM | POA: Diagnosis not present

## 2023-01-25 DIAGNOSIS — Z87891 Personal history of nicotine dependence: Secondary | ICD-10-CM | POA: Diagnosis not present

## 2023-01-25 DIAGNOSIS — M069 Rheumatoid arthritis, unspecified: Secondary | ICD-10-CM | POA: Diagnosis not present

## 2023-01-25 DIAGNOSIS — G8929 Other chronic pain: Secondary | ICD-10-CM | POA: Diagnosis not present

## 2023-02-04 DIAGNOSIS — R202 Paresthesia of skin: Secondary | ICD-10-CM | POA: Diagnosis not present

## 2023-02-04 DIAGNOSIS — R200 Anesthesia of skin: Secondary | ICD-10-CM | POA: Diagnosis not present

## 2023-02-07 ENCOUNTER — Inpatient Hospital Stay: Payer: Medicaid Other | Attending: Internal Medicine | Admitting: Internal Medicine

## 2023-02-07 ENCOUNTER — Encounter: Payer: Self-pay | Admitting: Internal Medicine

## 2023-02-07 DIAGNOSIS — G43009 Migraine without aura, not intractable, without status migrainosus: Secondary | ICD-10-CM | POA: Diagnosis not present

## 2023-02-07 DIAGNOSIS — R200 Anesthesia of skin: Secondary | ICD-10-CM | POA: Diagnosis not present

## 2023-02-07 DIAGNOSIS — R202 Paresthesia of skin: Secondary | ICD-10-CM | POA: Diagnosis not present

## 2023-02-07 DIAGNOSIS — R42 Dizziness and giddiness: Secondary | ICD-10-CM | POA: Diagnosis not present

## 2023-02-07 DIAGNOSIS — R5382 Chronic fatigue, unspecified: Secondary | ICD-10-CM | POA: Diagnosis not present

## 2023-02-07 DIAGNOSIS — F419 Anxiety disorder, unspecified: Secondary | ICD-10-CM | POA: Diagnosis not present

## 2023-02-07 NOTE — Progress Notes (Signed)
Energy is awful. Appetite is better. Abd pain is much better. Gets some pain after eating. Chronic constipation. Uses stimulant laxative and old fashioned enema's. Diagnosed with fibromyalgia recently. Any suggestions for fatigue. Genetic blood testing of gene mutations ?? To see what she is deficient in.

## 2023-02-07 NOTE — Assessment & Plan Note (Addendum)
#   Random urine total porphyrin abnormal.  Urine random PBG-negative.  Discussed with the patient that the workup of porphyrias in general is quite complicated.   # Extensive work up for porphyrias- ALA/Creatinine Ratio:  2.5; Delta ALA 2.0 mg/24 hr; PBG/Creatinine Ratio: 1.2; Porphobilinogen,24 U- 1.5; PORPHYRINS QUANT 24 HR URINE: wnl  Except random: Heptacarboxyl (7-CP) 4 (H);  N < 2 ] ug/L - slightly elevated-but clinically inconsequential.  # I do not think patient has porphyria-I would not recommend any further workup or evaluation.  # Chronic abdominal pain/constipation- ? Currently on oxycodone. ? SMA syndrome versus others.  CT AUG 2024- Large colorectal stool burden. Correlate for history of constipation.  Overall improved.  # hx of Seizures [last around christmas]/ADHD- on vyvase only when in school- stable.   # Fibromyalgia [Dr. Allie Dimmer Rheumatology]-question cause of patient's fatigue.  Per patient concerns- defer to rheumatology regarding any workup for genetic normalities associated with autoimmune disease.  # DISPOSITION: # follow up as needed-  Dr.B

## 2023-02-07 NOTE — Progress Notes (Signed)
I connected with Megan Bentley on 02/07/23 at  3:30 PM EDT by video enabled telemedicine visit and verified that I am speaking with the correct person using two identifiers.  I discussed the limitations, risks, security and privacy concerns of performing an evaluation and management service by telemedicine and the availability of in-person appointments. I also discussed with the patient that there may be a patient responsible charge related to this service. The patient expressed understanding and agreed to proceed.    Other persons participating in the visit and their role in the encounter: RN/medical reconciliation Patient's location: home Provider's location: office  Oncology History   No history exists.   Chief Complaint: Abnormal porphyria labs   History of present illness:Megan Bentley 37 y.o.  female with history of chronic abdominal pain/constipation of unclear etiology is here to review the results of her workup for porphyria.  Patient continues to have poor energy levels.  However her abdominal pain is improved.  Constipation continues to be a problem. Uses stimulant laxative and old fashioned enema's. Diagnosed with fibromyalgia recently.   Observation/objective: Alert & oriented x 3. In No acute distress.   Assessment and plan: Other porphyria (HCC) # Random urine total porphyrin abnormal.  Urine random PBG-negative.  Discussed with the patient that the workup of porphyrias in general is quite complicated.   # Extensive work up for porphyrias- ALA/Creatinine Ratio:  2.5; Delta ALA 2.0 mg/24 hr; PBG/Creatinine Ratio: 1.2; Porphobilinogen,24 U- 1.5; PORPHYRINS QUANT 24 HR URINE: wnl  Except random: Heptacarboxyl (7-CP) 4 (H);  N < 2 ] ug/L - slightly elevated-but clinically inconsequential.  # I do not think patient has porphyria-I would not recommend any further workup or evaluation.  # Chronic abdominal pain/constipation- ? Currently on oxycodone. ? SMA syndrome versus  others.  CT AUG 2024- Large colorectal stool burden. Correlate for history of constipation.  Overall improved.  # hx of Seizures [last around christmas]/ADHD- on vyvase only when in school- stable.   # Fibromyalgia [Dr. Allie Dimmer Rheumatology]-question cause of patient's fatigue.  Per patient concerns- defer to rheumatology regarding any workup for genetic normalities associated with autoimmune disease.  # DISPOSITION: # follow up as needed-  Dr.B  Dr. Louretta Shorten [facility] 02/07/2023 3:51 PM

## 2023-02-11 DIAGNOSIS — G894 Chronic pain syndrome: Secondary | ICD-10-CM | POA: Diagnosis not present

## 2023-02-13 DIAGNOSIS — Z419 Encounter for procedure for purposes other than remedying health state, unspecified: Secondary | ICD-10-CM | POA: Diagnosis not present

## 2023-02-16 DIAGNOSIS — R0789 Other chest pain: Secondary | ICD-10-CM | POA: Diagnosis not present

## 2023-02-16 DIAGNOSIS — N200 Calculus of kidney: Secondary | ICD-10-CM | POA: Diagnosis not present

## 2023-02-16 DIAGNOSIS — Z1152 Encounter for screening for COVID-19: Secondary | ICD-10-CM | POA: Diagnosis not present

## 2023-02-16 DIAGNOSIS — R0600 Dyspnea, unspecified: Secondary | ICD-10-CM | POA: Diagnosis not present

## 2023-02-16 DIAGNOSIS — R0609 Other forms of dyspnea: Secondary | ICD-10-CM | POA: Diagnosis not present

## 2023-02-16 DIAGNOSIS — Z87891 Personal history of nicotine dependence: Secondary | ICD-10-CM | POA: Diagnosis not present

## 2023-02-16 DIAGNOSIS — R1030 Lower abdominal pain, unspecified: Secondary | ICD-10-CM | POA: Diagnosis not present

## 2023-02-16 DIAGNOSIS — K5900 Constipation, unspecified: Secondary | ICD-10-CM | POA: Diagnosis not present

## 2023-02-16 DIAGNOSIS — R079 Chest pain, unspecified: Secondary | ICD-10-CM | POA: Diagnosis not present

## 2023-02-17 DIAGNOSIS — R0600 Dyspnea, unspecified: Secondary | ICD-10-CM | POA: Diagnosis not present

## 2023-02-17 DIAGNOSIS — R079 Chest pain, unspecified: Secondary | ICD-10-CM | POA: Diagnosis not present

## 2023-02-17 DIAGNOSIS — N200 Calculus of kidney: Secondary | ICD-10-CM | POA: Diagnosis not present

## 2023-02-18 DIAGNOSIS — R202 Paresthesia of skin: Secondary | ICD-10-CM | POA: Diagnosis not present

## 2023-02-18 DIAGNOSIS — R200 Anesthesia of skin: Secondary | ICD-10-CM | POA: Diagnosis not present

## 2023-02-18 DIAGNOSIS — G894 Chronic pain syndrome: Secondary | ICD-10-CM | POA: Diagnosis not present

## 2023-03-11 DIAGNOSIS — G8929 Other chronic pain: Secondary | ICD-10-CM | POA: Diagnosis not present

## 2023-03-11 DIAGNOSIS — M79605 Pain in left leg: Secondary | ICD-10-CM | POA: Diagnosis not present

## 2023-03-11 DIAGNOSIS — R42 Dizziness and giddiness: Secondary | ICD-10-CM | POA: Diagnosis not present

## 2023-03-11 DIAGNOSIS — R109 Unspecified abdominal pain: Secondary | ICD-10-CM | POA: Diagnosis not present

## 2023-03-11 DIAGNOSIS — Z79899 Other long term (current) drug therapy: Secondary | ICD-10-CM | POA: Diagnosis not present

## 2023-03-11 DIAGNOSIS — M79604 Pain in right leg: Secondary | ICD-10-CM | POA: Diagnosis not present

## 2023-03-11 DIAGNOSIS — R200 Anesthesia of skin: Secondary | ICD-10-CM | POA: Diagnosis not present

## 2023-03-11 DIAGNOSIS — G894 Chronic pain syndrome: Secondary | ICD-10-CM | POA: Diagnosis not present

## 2023-03-11 DIAGNOSIS — R202 Paresthesia of skin: Secondary | ICD-10-CM | POA: Diagnosis not present

## 2023-03-11 DIAGNOSIS — Z79891 Long term (current) use of opiate analgesic: Secondary | ICD-10-CM | POA: Diagnosis not present

## 2023-03-15 DIAGNOSIS — Z419 Encounter for procedure for purposes other than remedying health state, unspecified: Secondary | ICD-10-CM | POA: Diagnosis not present

## 2023-03-18 DIAGNOSIS — K581 Irritable bowel syndrome with constipation: Secondary | ICD-10-CM | POA: Diagnosis not present

## 2023-03-18 DIAGNOSIS — K5903 Drug induced constipation: Secondary | ICD-10-CM | POA: Diagnosis not present

## 2023-03-18 DIAGNOSIS — K625 Hemorrhage of anus and rectum: Secondary | ICD-10-CM | POA: Diagnosis not present

## 2023-03-22 ENCOUNTER — Ambulatory Visit: Payer: Medicaid Other | Admitting: Psychology

## 2023-03-24 ENCOUNTER — Ambulatory Visit: Payer: Medicaid Other | Admitting: Psychology

## 2023-04-07 DIAGNOSIS — F411 Generalized anxiety disorder: Secondary | ICD-10-CM | POA: Diagnosis not present

## 2023-04-07 DIAGNOSIS — R5382 Chronic fatigue, unspecified: Secondary | ICD-10-CM | POA: Diagnosis not present

## 2023-04-07 DIAGNOSIS — F4310 Post-traumatic stress disorder, unspecified: Secondary | ICD-10-CM | POA: Diagnosis not present

## 2023-04-07 DIAGNOSIS — G43009 Migraine without aura, not intractable, without status migrainosus: Secondary | ICD-10-CM | POA: Diagnosis not present

## 2023-04-07 DIAGNOSIS — R202 Paresthesia of skin: Secondary | ICD-10-CM | POA: Diagnosis not present

## 2023-04-07 DIAGNOSIS — R519 Headache, unspecified: Secondary | ICD-10-CM | POA: Diagnosis not present

## 2023-04-07 DIAGNOSIS — F419 Anxiety disorder, unspecified: Secondary | ICD-10-CM | POA: Diagnosis not present

## 2023-04-07 DIAGNOSIS — R55 Syncope and collapse: Secondary | ICD-10-CM | POA: Diagnosis not present

## 2023-04-07 DIAGNOSIS — F410 Panic disorder [episodic paroxysmal anxiety] without agoraphobia: Secondary | ICD-10-CM | POA: Diagnosis not present

## 2023-04-07 DIAGNOSIS — R200 Anesthesia of skin: Secondary | ICD-10-CM | POA: Diagnosis not present

## 2023-04-07 DIAGNOSIS — R42 Dizziness and giddiness: Secondary | ICD-10-CM | POA: Diagnosis not present

## 2023-04-12 ENCOUNTER — Encounter: Payer: Self-pay | Admitting: Dermatology

## 2023-04-12 ENCOUNTER — Ambulatory Visit: Payer: Medicaid Other | Admitting: Dermatology

## 2023-04-12 DIAGNOSIS — R208 Other disturbances of skin sensation: Secondary | ICD-10-CM | POA: Diagnosis not present

## 2023-04-12 DIAGNOSIS — R21 Rash and other nonspecific skin eruption: Secondary | ICD-10-CM

## 2023-04-12 NOTE — Patient Instructions (Signed)
Advised to return to clinic when flares again.      Due to recent changes in healthcare laws, you may see results of your pathology and/or laboratory studies on MyChart before the doctors have had a chance to review them. We understand that in some cases there may be results that are confusing or concerning to you. Please understand that not all results are received at the same time and often the doctors may need to interpret multiple results in order to provide you with the best plan of care or course of treatment. Therefore, we ask that you please give [place] 2 business days to thoroughly review all your results before contacting the office for clarification. Should we see a critical lab result, you will be contacted sooner.   If You Need Anything After Your Visit  If you have any questions or concerns for your doctor, please call our main line at [phone] and press option 4 to reach your doctor's medical assistant. If no one answers, please leave a voicemail as directed and we will return your call as soon as possible. Messages left after 4 pm will be answered the following business day.   You may also send [place] a message via MyChart. We typically respond to MyChart messages within 1-2 business days.  For prescription refills, please ask your pharmacy to contact our office. Our fax number is [phone].  If you have an urgent issue when the clinic is closed that cannot wait until the next business day, you can page your doctor at the number below.    Please note that while we do our best to be available for urgent issues outside of office hours, we are not available 24/7.   If you have an urgent issue and are unable to reach [place], you may choose to seek medical care at your doctor's office, retail clinic, urgent care center, or emergency room.  If you have a medical emergency, please immediately call 911 or go to the emergency department.  Pager Numbers  - Dr. Gwen Pounds: [phone]  -  Dr. Roseanne Reno: [phone]  - Dr. Katrinka Blazing: [phone]   In the event of inclement weather, please call our main line at [phone] for an update on the status of any delays or closures.  Dermatology Medication Tips: Please keep the boxes that topical medications come in in order to help keep track of the instructions about where and how to use these. Pharmacies typically print the medication instructions only on the boxes and not directly on the medication tubes.   If your medication is too expensive, please contact our office at [phone] option 4 or send [place] a message through MyChart.   We are unable to tell what your co-pay for medications will be in advance as this is different depending on your insurance coverage. However, we may be able to find a substitute medication at lower cost or fill out paperwork to get insurance to cover a needed medication.   If a prior authorization is required to get your medication covered by your insurance company, please allow [place] 1-2 business days to complete this process.  Drug prices often vary depending on where the prescription is filled and some pharmacies may offer cheaper prices.  The website www.goodrx.com contains coupons for medications through different pharmacies. The prices here do not account for what the cost may be with help from insurance (it may be cheaper with your insurance), but the website can give you the price if you did not use  any insurance.  - You can print the associated coupon and take it with your prescription to the pharmacy.  - You may also stop by our office during regular business hours and pick up a GoodRx coupon card.  - If you need your prescription sent electronically to a different pharmacy, notify our office through [facility] or by phone at [phone] option 4.     Si Usted Necesita Algo Despus de Su Visita  Tambin puede enviarnos un mensaje a travs de Clinical cytogeneticist. Por lo general respondemos a los  mensajes de MyChart en el transcurso de 1 a 2 das hbiles.  Para renovar recetas, por favor pida a su farmacia que se ponga en contacto con nuestra oficina. Annie Sable de fax es [place] [phone].  Si tiene un asunto urgente cuando la clnica est cerrada y que no puede esperar hasta el siguiente da hbil, puede llamar/localizar a su doctor(a) al nmero que aparece a continuacin.   Por favor, tenga en cuenta que aunque hacemos todo lo posible para estar disponibles para asuntos urgentes fuera del horario de [place], no estamos disponibles las 24 horas del da, los 7 [address]  [address] de la [place].   Si tiene un problema urgente y no puede comunicarse con nosotros, puede optar por buscar atencin mdica  en el consultorio de su doctor(a), en una clnica privada, en un centro de atencin urgente o en una sala de emergencias.  Si tiene Engineer, drilling, por favor llame inmediatamente al 911 o vaya a la sala de emergencias.  Nmeros de bper  - Dr. Gwen Pounds: [phone]  - Dra. Roseanne Reno: [phone]  - Dr. Katrinka Blazing: [phone]   En caso de inclemencias del tiempo, por favor llame a Lacy Duverney principal al [phone] para una actualizacin sobre el [place] de cualquier retraso o cierre.  Consejos para la medicacin en dermatologa: Por favor, guarde las cajas en las que vienen los medicamentos de uso tpico para ayudarle a seguir las instrucciones sobre dnde y cmo usarlos. Las farmacias generalmente imprimen las instrucciones del medicamento slo en las cajas y no directamente en los tubos del [place].   Si su medicamento es muy caro, por favor, pngase en contacto con Rolm Gala llamando al [phone] y presione la opcin 4 o envenos un mensaje a travs de Clinical cytogeneticist.   No podemos decirle cul ser su copago por los medicamentos por adelantado ya que esto es diferente dependiendo de la cobertura de su seguro. Sin embargo, es posible que podamos encontrar un medicamento sustituto a  Audiological scientist un formulario para que el seguro cubra el medicamento que se considera necesario.   Si se requiere una autorizacin previa para que su compaa de seguros [place] su medicamento, por favor permtanos de 1 a 2 das hbiles para completar [address]  Los precios de los medicamentos varan con frecuencia dependiendo del Environmental consultant de dnde se surte la receta y alguna farmacias pueden ofrecer precios ms baratos.  El sitio web www.goodrx.com tiene cupones para medicamentos de Health and safety inspector. Los precios aqu no tienen en cuenta lo que podra costar con la ayuda del seguro (puede ser ms barato con su seguro), pero el sitio web puede darle el precio si no utiliz Tourist information centre manager.  - Puede imprimir el cupn correspondiente y llevarlo con su receta a la farmacia.  - Tambin puede pasar por nuestra oficina durante el horario de atencin regular y Education officer, museum una tarjeta de cupones de GoodRx.  - Si necesita que su receta se enve electrnicamente  a [place] [employer], informe a nuestra oficina a travs de MyChart de [place] o por telfono llamando al [phone] y presione la opcin 4.

## 2023-04-12 NOTE — Progress Notes (Signed)
   New Patient Visit   Subjective  Megan Bentley is a 37 y.o. female who presents for the following: Rash on scalp. Comes and goes. Leaves scars. Itches but burns if touched. Drains pus, forms scab, peels off. Sometimes on arms, neck, face. No recent prescription treatment from PCP.  C/O scarring on face. Has fibromyalgia, chronic pain syndrome and a connective tissue disorder. This condition flares when experiencing more pain. Was flared when testing showed high ANA.  Tx with Doxycycline 100 mg twice daily for 7 days and diagnosed with folliculitis at ED 03/13/2022. Seemed to help some with this condition.   This patient is accompanied in the office by her child, Megan Bentley.   The following portions of the chart were reviewed this encounter and updated as appropriate: medications, allergies, medical history  Review of Systems:  No other skin or systemic complaints except as noted in HPI or Assessment and Plan.  Objective  Well appearing patient in no apparent distress; mood and affect are within normal limits.  A focused examination was performed of the following areas: Scalp, face, neck, arms  Relevant exam findings are noted in the Assessment and Plan.    Assessment & Plan    Suspicion for Scalp FOLLICULITIS. Possibility of scalp dysesthesia  Exam: scalp clear today  Folliculitis occurs due to inflammation of the superficial hair follicle (pore), resulting in acne-like lesions (pus bumps). It can be infectious (bacterial, fungal) or noninfectious (shaving, tight clothing, heat/sweat, medications).  Folliculitis can be acute or chronic and recommended treatment depends on the underlying cause of folliculitis.  Treatment Plan: Advised to return to clinic when flares again.   Will double book on day of flare if needed.    Return if symptoms worsen or fail to improve.  I, Lawson Radar, CMA, am acting as scribe for Elie Goody, MD.   Documentation: I have reviewed the  above documentation for accuracy and completeness, and I agree with the above.  Elie Goody, MD

## 2023-04-15 DIAGNOSIS — Z419 Encounter for procedure for purposes other than remedying health state, unspecified: Secondary | ICD-10-CM | POA: Diagnosis not present

## 2023-04-26 ENCOUNTER — Ambulatory Visit (INDEPENDENT_AMBULATORY_CARE_PROVIDER_SITE_OTHER): Payer: Medicaid Other | Admitting: Dermatology

## 2023-04-26 ENCOUNTER — Encounter: Payer: Self-pay | Admitting: Dermatology

## 2023-04-26 DIAGNOSIS — S0001XA Abrasion of scalp, initial encounter: Secondary | ICD-10-CM

## 2023-04-26 DIAGNOSIS — S00412A Abrasion of left ear, initial encounter: Secondary | ICD-10-CM

## 2023-04-26 DIAGNOSIS — L739 Follicular disorder, unspecified: Secondary | ICD-10-CM

## 2023-04-26 DIAGNOSIS — T148XXA Other injury of unspecified body region, initial encounter: Secondary | ICD-10-CM

## 2023-04-26 DIAGNOSIS — Z7189 Other specified counseling: Secondary | ICD-10-CM

## 2023-04-26 MED ORDER — DOXYCYCLINE MONOHYDRATE 100 MG PO CAPS: 100.0000 mg | ORAL_CAPSULE | Freq: Two times a day (BID) | ORAL | 1 refills | Status: DC

## 2023-04-26 MED ORDER — EUCRISA 2 % EX OINT: TOPICAL_OINTMENT | CUTANEOUS | 2 refills | Status: AC

## 2023-04-26 NOTE — Patient Instructions (Addendum)
Start Eucrisa ointment twice daily to affected areas on face, scalp, ears.  Start Doxycycline 100 mg twice daily with food. Take until next appointment.   Doxycycline should be taken with food to prevent nausea. Do not lay down for 30 minutes after taking. Be cautious with sun exposure and use good sun protection while on this medication. Pregnant women should not take this medication.     Due to recent changes in healthcare laws, you may see results of your pathology and/or laboratory studies on MyChart before the doctors have had a chance to review them. We understand that in some cases there may be results that are confusing or concerning to you. Please understand that not all results are received at the same time and often the doctors may need to interpret multiple results in order to provide you with the best plan of care or course of treatment. Therefore, we ask that you please give [place] 2 business days to thoroughly review all your results before contacting the office for clarification. Should we see a critical lab result, you will be contacted sooner.   If You Need Anything After Your Visit  If you have any questions or concerns for your doctor, please call our main line at [phone] and press option 4 to reach your doctor's medical assistant. If no one answers, please leave a voicemail as directed and we will return your call as soon as possible. Messages left after 4 pm will be answered the following business day.   You may also send [place] a message via MyChart. We typically respond to MyChart messages within 1-2 business days.  For prescription refills, please ask your pharmacy to contact our office. Our fax number is [phone].  If you have an urgent issue when the clinic is closed that cannot wait until the next business day, you can page your doctor at the number below.    Please note that while we do our best to be available for urgent issues outside of office hours, we are not  available 24/7.   If you have an urgent issue and are unable to reach [place], you may choose to seek medical care at your doctor's office, retail clinic, urgent care center, or emergency room.  If you have a medical emergency, please immediately call 911 or go to the emergency department.  Pager Numbers  - Dr. Gwen Pounds: [phone]  - Dr. Roseanne Reno: [phone]  - Dr. Katrinka Blazing: [phone]   In the event of inclement weather, please call our main line at [phone] for an update on the status of any delays or closures.  Dermatology Medication Tips: Please keep the boxes that topical medications come in in order to help keep track of the instructions about where and how to use these. Pharmacies typically print the medication instructions only on the boxes and not directly on the medication tubes.   If your medication is too expensive, please contact our office at [phone] option 4 or send [place] a message through MyChart.   We are unable to tell what your co-pay for medications will be in advance as this is different depending on your insurance coverage. However, we may be able to find a substitute medication at lower cost or fill out paperwork to get insurance to cover a needed medication.   If a prior authorization is required to get your medication covered by your insurance company, please allow [place] 1-2 business days to complete this process.  Drug prices often vary depending on where the  prescription is filled and some pharmacies may offer cheaper prices.  The website www.goodrx.com contains coupons for medications through different pharmacies. The prices here do not account for what the cost may be with help from insurance (it may be cheaper with your insurance), but the website can give you the price if you did not use any insurance.  - You can print the associated coupon and take it with your prescription to the pharmacy.  - You may also stop by our office during regular business hours  and pick up a GoodRx coupon card.  - If you need your prescription sent electronically to a different pharmacy, notify our office through [facility] or by phone at [phone] option 4.     Si Usted Necesita Algo Despus de Su Visita  Tambin puede enviarnos un mensaje a travs de Clinical cytogeneticist. Por lo general respondemos a los mensajes de MyChart en el transcurso de 1 a 2 das hbiles.  Para renovar recetas, por favor pida a su farmacia que se ponga en contacto con nuestra oficina. Annie Sable de fax es [place] [phone].  Si tiene un asunto urgente cuando la clnica est cerrada y que no puede esperar hasta el siguiente da hbil, puede llamar/localizar a su doctor(a) al nmero que aparece a continuacin.   Por favor, tenga en cuenta que aunque hacemos todo lo posible para estar disponibles para asuntos urgentes fuera del horario de [place], no estamos disponibles las 24 horas del da, los 7 [address]  [address] de la [place].   Si tiene un problema urgente y no puede comunicarse con nosotros, puede optar por buscar atencin mdica  en el consultorio de su doctor(a), en una clnica privada, en un centro de atencin urgente o en una sala de emergencias.  Si tiene Engineer, drilling, por favor llame inmediatamente al 911 o vaya a la sala de emergencias.  Nmeros de bper  - Dr. Gwen Pounds: [phone]  - Dra. Roseanne Reno: [phone]  - Dr. Katrinka Blazing: [phone]   En caso de inclemencias del tiempo, por favor llame a Lacy Duverney principal al [phone] para una actualizacin sobre el [place] de cualquier retraso o cierre.  Consejos para la medicacin en dermatologa: Por favor, guarde las cajas en las que vienen los medicamentos de uso tpico para ayudarle a seguir las instrucciones sobre dnde y cmo usarlos. Las farmacias generalmente imprimen las instrucciones del medicamento slo en las cajas y no directamente en los tubos del [place].   Si su medicamento es muy caro, por favor, pngase  en contacto con Rolm Gala llamando al [phone] y presione la opcin 4 o envenos un mensaje a travs de Clinical cytogeneticist.   No podemos decirle cul ser su copago por los medicamentos por adelantado ya que esto es diferente dependiendo de la cobertura de su seguro. Sin embargo, es posible que podamos encontrar un medicamento sustituto a Audiological scientist un formulario para que el seguro cubra el medicamento que se considera necesario.   Si se requiere una autorizacin previa para que su compaa de seguros [place] su medicamento, por favor permtanos de 1 a 2 das hbiles para completar [address]  Los precios de los medicamentos varan con frecuencia dependiendo del Environmental consultant de dnde se surte la receta y alguna farmacias pueden ofrecer precios ms baratos.  El sitio web www.goodrx.com tiene cupones para medicamentos de Health and safety inspector. Los precios aqu no tienen en cuenta lo que podra costar con la ayuda del seguro (puede ser ms barato con su seguro), pero el sitio  web puede darle el precio si no Visual merchandiser.  - Puede imprimir el cupn correspondiente y llevarlo con su receta a la farmacia.  - Tambin puede pasar por nuestra oficina durante el horario de atencin regular y Education officer, museum una tarjeta de cupones de GoodRx.  - Si necesita que su receta se enve electrnicamente a una farmacia diferente, informe a nuestra oficina a travs de MyChart de [place] o por telfono llamando al [phone] y presione la opcin 4.

## 2023-04-26 NOTE — Progress Notes (Signed)
   Follow Up Visit   Subjective  Megan Bentley is a 37 y.o. female who presents for the following: Rash, suspect folliculitis, on scalp. Flaring. Was told to RTC for biopsy if condition flared again. C/O pain and burning.    The following portions of the chart were reviewed this encounter and updated as appropriate: medications, allergies, medical history  Review of Systems:  No other skin or systemic complaints except as noted in HPI or Assessment and Plan.  Objective  Well appearing patient in no apparent distress; mood and affect are within normal limits.  A focused examination was performed of the following areas: Scalp   Relevant exam findings are noted in the Assessment and Plan.    Assessment & Plan   Folliculitis  Related Procedures Anaerobic and Aerobic Culture  Related Medications doxycycline (MONODOX) 100 MG capsule Take 1 capsule (100 mg total) by mouth 2 (two) times daily. Take with food  Crisaborole (EUCRISA) 2 % OINT Apply twice daily to affected areas on face, scalp, ears  Excoriation    Excoriation with suspicion for Scalp Folliculitis  Exam: crusted excoriated papules on occipital scalp and left helical insertion  Chronic and persistent condition with duration or expected duration over one year. Condition is bothersome/symptomatic for patient. Currently flared.   Treatment Plan: Start Eucrisa ointment twice daily to affected areas on face, scalp, ears. Start Doxycycline 100 mg twice daily with food.  Swab to check for Staph aureus If not improving at next visit plan biopsy.   Doxycycline should be taken with food to prevent nausea. Do not lay down for 30 minutes after taking. Be cautious with sun exposure and use good sun protection while on this medication. Pregnant women should not take this medication.      Return in about 1 month (around 05/26/2023) for Rash Follow Up.  I, Lawson Radar, CMA, am acting as scribe for Elie Goody,  MD.   Documentation: I have reviewed the above documentation for accuracy and completeness, and I agree with the above.  Elie Goody, MD

## 2023-04-30 ENCOUNTER — Encounter: Payer: Self-pay | Admitting: Dermatology

## 2023-05-02 ENCOUNTER — Telehealth: Payer: Self-pay

## 2023-05-02 NOTE — Telephone Encounter (Signed)
Medicaid has denied Eucrisa PA until patient has tried and failed a topical steroid.

## 2023-05-03 LAB — ANAEROBIC AND AEROBIC CULTURE

## 2023-05-04 DIAGNOSIS — G6289 Other specified polyneuropathies: Secondary | ICD-10-CM | POA: Diagnosis not present

## 2023-05-04 DIAGNOSIS — M5416 Radiculopathy, lumbar region: Secondary | ICD-10-CM | POA: Diagnosis not present

## 2023-05-14 DIAGNOSIS — M4727 Other spondylosis with radiculopathy, lumbosacral region: Secondary | ICD-10-CM | POA: Diagnosis not present

## 2023-05-14 DIAGNOSIS — M5416 Radiculopathy, lumbar region: Secondary | ICD-10-CM | POA: Diagnosis not present

## 2023-05-15 DIAGNOSIS — Z419 Encounter for procedure for purposes other than remedying health state, unspecified: Secondary | ICD-10-CM | POA: Diagnosis not present

## 2023-05-24 DIAGNOSIS — R109 Unspecified abdominal pain: Secondary | ICD-10-CM | POA: Diagnosis not present

## 2023-05-24 DIAGNOSIS — R42 Dizziness and giddiness: Secondary | ICD-10-CM | POA: Diagnosis not present

## 2023-05-24 DIAGNOSIS — M792 Neuralgia and neuritis, unspecified: Secondary | ICD-10-CM | POA: Diagnosis not present

## 2023-05-24 DIAGNOSIS — G894 Chronic pain syndrome: Secondary | ICD-10-CM | POA: Diagnosis not present

## 2023-05-26 ENCOUNTER — Ambulatory Visit: Payer: Medicaid Other | Admitting: Dermatology

## 2023-06-02 DIAGNOSIS — M545 Low back pain, unspecified: Secondary | ICD-10-CM | POA: Diagnosis not present

## 2023-06-02 DIAGNOSIS — R200 Anesthesia of skin: Secondary | ICD-10-CM | POA: Diagnosis not present

## 2023-06-02 DIAGNOSIS — G43009 Migraine without aura, not intractable, without status migrainosus: Secondary | ICD-10-CM | POA: Diagnosis not present

## 2023-06-02 DIAGNOSIS — R55 Syncope and collapse: Secondary | ICD-10-CM | POA: Diagnosis not present

## 2023-06-02 DIAGNOSIS — R202 Paresthesia of skin: Secondary | ICD-10-CM | POA: Diagnosis not present

## 2023-06-02 DIAGNOSIS — F4310 Post-traumatic stress disorder, unspecified: Secondary | ICD-10-CM | POA: Diagnosis not present

## 2023-06-02 DIAGNOSIS — R42 Dizziness and giddiness: Secondary | ICD-10-CM | POA: Diagnosis not present

## 2023-06-02 DIAGNOSIS — F411 Generalized anxiety disorder: Secondary | ICD-10-CM | POA: Diagnosis not present

## 2023-06-02 DIAGNOSIS — F410 Panic disorder [episodic paroxysmal anxiety] without agoraphobia: Secondary | ICD-10-CM | POA: Diagnosis not present

## 2023-06-13 ENCOUNTER — Ambulatory Visit: Payer: Medicaid Other | Admitting: Dermatology

## 2023-06-15 DIAGNOSIS — Z419 Encounter for procedure for purposes other than remedying health state, unspecified: Secondary | ICD-10-CM | POA: Diagnosis not present

## 2023-06-24 DIAGNOSIS — M79605 Pain in left leg: Secondary | ICD-10-CM | POA: Diagnosis not present

## 2023-06-24 DIAGNOSIS — M79604 Pain in right leg: Secondary | ICD-10-CM | POA: Diagnosis not present

## 2023-06-24 DIAGNOSIS — G894 Chronic pain syndrome: Secondary | ICD-10-CM | POA: Diagnosis not present

## 2023-07-15 DIAGNOSIS — M79602 Pain in left arm: Secondary | ICD-10-CM | POA: Diagnosis not present

## 2023-07-15 DIAGNOSIS — R202 Paresthesia of skin: Secondary | ICD-10-CM | POA: Diagnosis not present

## 2023-07-15 DIAGNOSIS — M79601 Pain in right arm: Secondary | ICD-10-CM | POA: Diagnosis not present

## 2023-07-15 DIAGNOSIS — M5416 Radiculopathy, lumbar region: Secondary | ICD-10-CM | POA: Diagnosis not present

## 2023-07-16 DIAGNOSIS — Z419 Encounter for procedure for purposes other than remedying health state, unspecified: Secondary | ICD-10-CM | POA: Diagnosis not present

## 2023-07-20 ENCOUNTER — Other Ambulatory Visit: Payer: Self-pay | Admitting: Dermatology

## 2023-07-20 DIAGNOSIS — L739 Follicular disorder, unspecified: Secondary | ICD-10-CM

## 2023-07-21 ENCOUNTER — Ambulatory Visit: Payer: Self-pay | Admitting: Physician Assistant

## 2023-07-21 ENCOUNTER — Ambulatory Visit: Payer: Medicaid Other | Admitting: Physician Assistant

## 2023-07-27 DIAGNOSIS — G894 Chronic pain syndrome: Secondary | ICD-10-CM | POA: Diagnosis not present

## 2023-07-28 ENCOUNTER — Ambulatory Visit: Payer: Medicaid Other | Admitting: Physician Assistant

## 2023-07-29 ENCOUNTER — Encounter: Payer: Self-pay | Admitting: Physician Assistant

## 2023-08-13 DIAGNOSIS — Z419 Encounter for procedure for purposes other than remedying health state, unspecified: Secondary | ICD-10-CM | POA: Diagnosis not present

## 2023-09-01 DIAGNOSIS — W57XXXA Bitten or stung by nonvenomous insect and other nonvenomous arthropods, initial encounter: Secondary | ICD-10-CM | POA: Diagnosis not present

## 2023-09-01 DIAGNOSIS — R52 Pain, unspecified: Secondary | ICD-10-CM | POA: Diagnosis not present

## 2023-09-01 DIAGNOSIS — S0006XA Insect bite (nonvenomous) of scalp, initial encounter: Secondary | ICD-10-CM | POA: Diagnosis not present

## 2023-09-01 DIAGNOSIS — R509 Fever, unspecified: Secondary | ICD-10-CM | POA: Diagnosis not present

## 2023-09-03 ENCOUNTER — Other Ambulatory Visit: Payer: Self-pay

## 2023-09-03 ENCOUNTER — Emergency Department
Admission: EM | Admit: 2023-09-03 | Discharge: 2023-09-03 | Disposition: A | Discharge status: Home / Self Care | Attending: Emergency Medicine | Admitting: Emergency Medicine

## 2023-09-03 DIAGNOSIS — S30861A Insect bite (nonvenomous) of abdominal wall, initial encounter: Secondary | ICD-10-CM | POA: Insufficient documentation

## 2023-09-03 DIAGNOSIS — W57XXXA Bitten or stung by nonvenomous insect and other nonvenomous arthropods, initial encounter: Secondary | ICD-10-CM | POA: Insufficient documentation

## 2023-09-03 DIAGNOSIS — S1086XA Insect bite of other specified part of neck, initial encounter: Secondary | ICD-10-CM | POA: Diagnosis not present

## 2023-09-03 DIAGNOSIS — A6920 Lyme disease, unspecified: Secondary | ICD-10-CM | POA: Diagnosis not present

## 2023-09-03 DIAGNOSIS — R5383 Other fatigue: Secondary | ICD-10-CM | POA: Diagnosis not present

## 2023-09-03 DIAGNOSIS — W57XXXD Bitten or stung by nonvenomous insect and other nonvenomous arthropods, subsequent encounter: Secondary | ICD-10-CM

## 2023-09-03 DIAGNOSIS — R6889 Other general symptoms and signs: Secondary | ICD-10-CM

## 2023-09-03 LAB — CBC WITH DIFFERENTIAL/PLATELET
Abs Immature Granulocytes: 0.02 10*3/uL (ref 0.00–0.07)
Basophils Absolute: 0 10*3/uL (ref 0.0–0.1)
Basophils Relative: 0 %
Eosinophils Absolute: 0.3 10*3/uL (ref 0.0–0.5)
Eosinophils Relative: 4 %
HCT: 38.7 % (ref 36.0–46.0)
Hemoglobin: 13.4 g/dL (ref 12.0–15.0)
Immature Granulocytes: 0 %
Lymphocytes Relative: 42 %
Lymphs Abs: 2.7 10*3/uL (ref 0.7–4.0)
MCH: 32.6 pg (ref 26.0–34.0)
MCHC: 34.6 g/dL (ref 30.0–36.0)
MCV: 94.2 fL (ref 80.0–100.0)
Monocytes Absolute: 0.4 10*3/uL (ref 0.1–1.0)
Monocytes Relative: 7 %
Neutro Abs: 3 10*3/uL (ref 1.7–7.7)
Neutrophils Relative %: 47 %
Platelets: 176 10*3/uL (ref 150–400)
RBC: 4.11 MIL/uL (ref 3.87–5.11)
RDW: 12.8 % (ref 11.5–15.5)
WBC: 6.4 10*3/uL (ref 4.0–10.5)
nRBC: 0 % (ref 0.0–0.2)

## 2023-09-03 LAB — COMPREHENSIVE METABOLIC PANEL
ALT: 11 U/L (ref 0–44)
AST: 15 U/L (ref 15–41)
Albumin: 4.3 g/dL (ref 3.5–5.0)
Alkaline Phosphatase: 38 U/L (ref 38–126)
Anion gap: 6 (ref 5–15)
BUN: 16 mg/dL (ref 6–20)
CO2: 25 mmol/L (ref 22–32)
Calcium: 9.1 mg/dL (ref 8.9–10.3)
Chloride: 105 mmol/L (ref 98–111)
Creatinine, Ser: 0.51 mg/dL (ref 0.44–1.00)
GFR, Estimated: >60 mL/min (ref >60)
Glucose, Bld: 92 mg/dL (ref 70–99)
Potassium: 3.9 mmol/L (ref 3.5–5.1)
Sodium: 136 mmol/L (ref 135–145)
Total Bilirubin: 0.5 mg/dL (ref 0.0–1.2)
Total Protein: 6.8 g/dL (ref 6.5–8.1)

## 2023-09-03 NOTE — ED Provider Notes (Signed)
 Megan Bentley EMERGENCY DEPARTMENT AT [facility] Provider Note   CSN: [id number] Arrival date & time: 09/03/23  1932     History  Chief Complaint  Patient presents with   Tick Removal   Fatigue    Megan Bentley is a 38 y.o. female.  Patient here for fatigue and tick removal.  She notes for the past 2 weeks has been staying at her dad's place now found a tick lodged in her right flank as well as left neck was able to remove 1 but still has partially embedded tick of right flank.  Notes headache fatigue nausea fever up to 103 at home.  Seen by urgent care started on course of doxycycline.  Also has had a Lyme titer sent off requesting the results.        Home Medications Prior to Admission medications   Medication Sig Start Date End Date Taking? Authorizing Provider  buprenorphine (SUBUTEX) 8 MG SUBL SL tablet Place 4 mg under the tongue daily. 12/01/22   [provider]  butalbital-acetaminophen-caffeine (FIORICET) 50-325-40 MG tablet Take 1 tablet by mouth daily. 02/09/23   [provider]  Crisaborole (EUCRISA) 2 % OINT Apply twice daily to affected areas on face, scalp, ears 04/26/23   Elie Goody, MD  cyclobenzaprine (FLEXERIL) 10 MG tablet Take 10 mg by mouth 3 (three) times daily as needed. 03/01/23   [provider]  doxycycline (MONODOX) 100 MG capsule TAKE 1 CAPSULE (100 MG TOTAL) BY MOUTH 2 (TWO) TIMES DAILY. TAKE WITH FOOD 07/20/23   Elie Goody, MD  gabapentin (NEURONTIN) 100 MG capsule Take by mouth. 03/04/23   [provider]  ketorolac (TORADOL) 10 MG tablet Take 10 mg by mouth every 6 (six) hours as needed. 02/17/23   [provider]  Multiple Vitamin (MULTIVITAMIN) tablet Take 1 tablet by mouth daily.    [provider]  naloxone [facility]) nasal spray 4 mg/0.1 mL Place into the nose. 02/11/23   [provider]  ondansetron (ZOFRAN) 4 MG tablet TAKE 1 TABLET BY MOUTH EVERY 8 HOURS AS NEEDED  FOR NAUSEA AND VOMITING Patient not taking: Reported on 02/07/2023 09/29/22   Aura Dials T, NP  pregabalin (LYRICA) 150 MG capsule Take 150 mg by mouth 3 (three) times daily.    [provider]  Thiamine HCl (VITAMIN B1 PO) Take by mouth. Patient not taking: Reported on 02/07/2023    [provider]      Allergies    Iodinated contrast media, Sulfa antibiotics, Molnupiravir, Amoxicillin, and Prednisone    Review of Systems   Review of Systems  Constitutional:  Positive for chills, fatigue and fever.  Respiratory:  Negative for cough and shortness of breath.   Cardiovascular:  Negative for chest pain.  Gastrointestinal:  Negative for abdominal pain.  Musculoskeletal:  Negative for arthralgias.  Skin:  Positive for rash.  Neurological:  Negative for weakness and numbness.    Physical Exam Updated Vital Signs BP 129/86   Pulse 85   Temp 98.5 F (36.9 C) (Oral)   Resp 16   LMP 06/11/2021 (Approximate)   SpO2 100%  Physical Exam Vitals reviewed.  Constitutional:      Appearance: Normal appearance.  HENT:     Head: Normocephalic and atraumatic.     Nose: Nose normal.  Cardiovascular:     Pulses: Normal pulses.  Pulmonary:     Effort: Pulmonary effort is normal.  Abdominal:     Tenderness: There is no abdominal tenderness.  Musculoskeletal:     Cervical back: Normal range of motion.  Skin:    Comments: Right flank with no rash but small possible foreign body likelihood of tick.  No other rashes identified  Neurological:     Mental Status: She is alert and oriented to person, place, and time. Mental status is at baseline.  Psychiatric:        Mood and Affect: Mood normal.        Behavior: Behavior normal.     ED Results / Procedures / Treatments   Labs (all labs ordered are listed, but only abnormal results are displayed) Labs Reviewed  CBC WITH DIFFERENTIAL/PLATELET  COMPREHENSIVE METABOLIC PANEL    EKG None  Radiology No results  found.  Procedures .Foreign Body Removal  Date/Time: 09/03/2023 10:26 PM  Performed by: Christen Bame, PA-C Authorized by: Christen Bame, PA-C  Consent: Verbal consent obtained. Risks and benefits: risks, benefits and alternatives were discussed Consent given by: patient Patient understanding: patient states understanding of the procedure being performed Site marked: the operative site was marked Imaging studies: imaging studies available Patient identity confirmed: verbally with patient Time out: Immediately prior to procedure a "time out" was called to verify the correct patient, procedure, equipment, support staff and site/side marked as required. Body area: skin General location: trunk Location details: back  Sedation: Patient sedated: no  Patient restrained: no Patient cooperative: yes Depth: epidermis. Complexity: simple 1 objects recovered. Objects recovered: 1 Post-procedure assessment: foreign body removed Patient tolerance: patient tolerated the procedure well with no immediate complications      Medications Ordered in ED Medications - No data to display  ED Course/ Medical Decision Making/ A&P                                 Medical Decision Making Patient here for generalized fatigue nausea headache in the setting of recent tick bite for extended period of time possibly had a tick embedded for up to 2 weeks.  She has already been started on doxycycline course and had Lyme titer sent off however unable to see the results as of yet.  She does have what appears to be embedded head of tick on right flank I will remove here and check basic labs.  We were able to remove foreign body likely head of tic without difficulty using a 11 blade.  CBC and CMP overall unremarkable.  Patient maintains afebrile normotensive overall well-appearing.  She already has titer pending thus we did not redraw 1 today.  She is already on antibiotics will recommend continue.  Symptomatic  treatment.  Follow-up with primary.  Given return precautions here.    Amount and/or Complexity of Data Reviewed Labs: ordered.           Final Clinical Impression(s) / ED Diagnoses Final diagnoses:  Tick bite, unspecified site, subsequent encounter  Suspected Lyme disease    Rx / DC Orders ED Discharge Orders     None         Christen Bame, PA-C 09/03/23 2304    Sharyn Creamer, MD 09/04/23 0010

## 2023-09-03 NOTE — ED Triage Notes (Addendum)
 Pt has had two tick bites, one is still lodged in shoulder area. Pt c/o fever high of 103, feeling flushed, fatigue, headache, nausea. Pt AOX4, NAD noted. Red bite noted on right shoulder, no bulleye rash noted, no rash noted on scalp/neck area. Pt on doxycycline currently.

## 2023-09-03 NOTE — Discharge Instructions (Signed)
 Get plenty of rest.  Tylenol 650 mg 4 times daily for pain and bodyaches.  Get plenty of fluids including electrolyte beverages.  Follow-up with primary care for recheck in the next few days.  Continue your current antibiotics.  Return to the nearest emergency department for any new or worse symptoms.

## 2023-09-06 DIAGNOSIS — G894 Chronic pain syndrome: Secondary | ICD-10-CM | POA: Diagnosis not present

## 2023-09-06 DIAGNOSIS — W57XXXA Bitten or stung by nonvenomous insect and other nonvenomous arthropods, initial encounter: Secondary | ICD-10-CM | POA: Diagnosis not present

## 2023-09-13 DIAGNOSIS — K551 Chronic vascular disorders of intestine: Secondary | ICD-10-CM | POA: Diagnosis not present

## 2023-09-13 DIAGNOSIS — F4310 Post-traumatic stress disorder, unspecified: Secondary | ICD-10-CM | POA: Diagnosis not present

## 2023-09-13 DIAGNOSIS — G43909 Migraine, unspecified, not intractable, without status migrainosus: Secondary | ICD-10-CM | POA: Diagnosis not present

## 2023-09-13 DIAGNOSIS — R636 Underweight: Secondary | ICD-10-CM | POA: Diagnosis not present

## 2023-09-13 DIAGNOSIS — N814 Uterovaginal prolapse, unspecified: Secondary | ICD-10-CM | POA: Diagnosis not present

## 2023-09-13 DIAGNOSIS — G629 Polyneuropathy, unspecified: Secondary | ICD-10-CM | POA: Diagnosis not present

## 2023-09-13 DIAGNOSIS — Z809 Family history of malignant neoplasm, unspecified: Secondary | ICD-10-CM | POA: Diagnosis not present

## 2023-09-13 DIAGNOSIS — Z882 Allergy status to sulfonamides status: Secondary | ICD-10-CM | POA: Diagnosis not present

## 2023-09-13 DIAGNOSIS — K224 Dyskinesia of esophagus: Secondary | ICD-10-CM | POA: Diagnosis not present

## 2023-09-13 DIAGNOSIS — Z87891 Personal history of nicotine dependence: Secondary | ICD-10-CM | POA: Diagnosis not present

## 2023-09-24 DIAGNOSIS — Z419 Encounter for procedure for purposes other than remedying health state, unspecified: Secondary | ICD-10-CM | POA: Diagnosis not present

## 2023-10-17 DIAGNOSIS — G894 Chronic pain syndrome: Secondary | ICD-10-CM | POA: Diagnosis not present

## 2023-10-17 DIAGNOSIS — Z13220 Encounter for screening for lipoid disorders: Secondary | ICD-10-CM | POA: Diagnosis not present

## 2023-10-17 DIAGNOSIS — Z0000 Encounter for general adult medical examination without abnormal findings: Secondary | ICD-10-CM | POA: Diagnosis not present

## 2023-10-17 DIAGNOSIS — Z1159 Encounter for screening for other viral diseases: Secondary | ICD-10-CM | POA: Diagnosis not present

## 2023-10-24 DIAGNOSIS — Z419 Encounter for procedure for purposes other than remedying health state, unspecified: Secondary | ICD-10-CM | POA: Diagnosis not present

## 2023-11-24 DIAGNOSIS — Z419 Encounter for procedure for purposes other than remedying health state, unspecified: Secondary | ICD-10-CM | POA: Diagnosis not present

## 2023-12-21 DIAGNOSIS — G894 Chronic pain syndrome: Secondary | ICD-10-CM | POA: Diagnosis not present

## 2023-12-24 DIAGNOSIS — Z419 Encounter for procedure for purposes other than remedying health state, unspecified: Secondary | ICD-10-CM | POA: Diagnosis not present

## 2024-01-24 DIAGNOSIS — Z419 Encounter for procedure for purposes other than remedying health state, unspecified: Secondary | ICD-10-CM | POA: Diagnosis not present

## 2024-02-16 DIAGNOSIS — G43009 Migraine without aura, not intractable, without status migrainosus: Secondary | ICD-10-CM | POA: Diagnosis not present

## 2024-02-16 DIAGNOSIS — R42 Dizziness and giddiness: Secondary | ICD-10-CM | POA: Diagnosis not present

## 2024-02-16 DIAGNOSIS — F410 Panic disorder [episodic paroxysmal anxiety] without agoraphobia: Secondary | ICD-10-CM | POA: Diagnosis not present

## 2024-02-16 DIAGNOSIS — F411 Generalized anxiety disorder: Secondary | ICD-10-CM | POA: Diagnosis not present

## 2024-02-16 DIAGNOSIS — F4310 Post-traumatic stress disorder, unspecified: Secondary | ICD-10-CM | POA: Diagnosis not present

## 2024-02-16 DIAGNOSIS — M5416 Radiculopathy, lumbar region: Secondary | ICD-10-CM | POA: Diagnosis not present

## 2024-02-16 DIAGNOSIS — R202 Paresthesia of skin: Secondary | ICD-10-CM | POA: Diagnosis not present

## 2024-02-24 DIAGNOSIS — Z419 Encounter for procedure for purposes other than remedying health state, unspecified: Secondary | ICD-10-CM | POA: Diagnosis not present

## 2024-05-25 DIAGNOSIS — Z419 Encounter for procedure for purposes other than remedying health state, unspecified: Secondary | ICD-10-CM | POA: Diagnosis not present
